# Patient Record
Sex: Male | Born: 1955
Health system: Southern US, Community
[De-identification: ages and names within clinical notes are randomized; demographics above are authoritative.]

## PROBLEM LIST (undated history)

## (undated) DIAGNOSIS — K08109 Complete loss of teeth, unspecified cause, unspecified class: Secondary | ICD-10-CM

## (undated) DIAGNOSIS — E119 Type 2 diabetes mellitus without complications: Secondary | ICD-10-CM

## (undated) DIAGNOSIS — E559 Vitamin D deficiency, unspecified: Secondary | ICD-10-CM

## (undated) DIAGNOSIS — G4730 Sleep apnea, unspecified: Secondary | ICD-10-CM

## (undated) DIAGNOSIS — Z973 Presence of spectacles and contact lenses: Secondary | ICD-10-CM

## (undated) DIAGNOSIS — R011 Cardiac murmur, unspecified: Secondary | ICD-10-CM

## (undated) DIAGNOSIS — C801 Malignant (primary) neoplasm, unspecified: Secondary | ICD-10-CM

## (undated) DIAGNOSIS — Z972 Presence of dental prosthetic device (complete) (partial): Secondary | ICD-10-CM

## (undated) DIAGNOSIS — J189 Pneumonia, unspecified organism: Secondary | ICD-10-CM

## (undated) DIAGNOSIS — E785 Hyperlipidemia, unspecified: Secondary | ICD-10-CM

## (undated) DIAGNOSIS — G4733 Obstructive sleep apnea (adult) (pediatric): Secondary | ICD-10-CM

## (undated) DIAGNOSIS — K219 Gastro-esophageal reflux disease without esophagitis: Secondary | ICD-10-CM

## (undated) DIAGNOSIS — I10 Essential (primary) hypertension: Secondary | ICD-10-CM

## (undated) DIAGNOSIS — K469 Unspecified abdominal hernia without obstruction or gangrene: Secondary | ICD-10-CM

## (undated) HISTORY — DX: Type 2 diabetes mellitus without complications: E11.9

## (undated) HISTORY — DX: Sleep apnea, unspecified: G47.30

## (undated) HISTORY — PX: METATARSAL OSTEOTOMY WITH BUNIONECTOMY: SHX5662

## (undated) HISTORY — PX: VASECTOMY: SHX75

## (undated) HISTORY — DX: Cardiac murmur, unspecified: R01.1

## (undated) HISTORY — DX: Essential (primary) hypertension: I10

## (undated) HISTORY — PX: OTHER SURGICAL HISTORY: SHX169

## (undated) HISTORY — DX: Hyperlipidemia, unspecified: E78.5

## (undated) HISTORY — PX: LASER ABLATION: SHX1947

## (undated) HISTORY — PX: MULTIPLE TOOTH EXTRACTIONS: SHX2053

## (undated) HISTORY — DX: Gastro-esophageal reflux disease without esophagitis: K21.9

## (undated) HISTORY — DX: Malignant (primary) neoplasm, unspecified: C80.1

## (undated) HISTORY — DX: Vitamin D deficiency, unspecified: E55.9

---

## 2002-03-25 ENCOUNTER — Emergency Department (HOSPITAL_COMMUNITY): Admission: EM | Admit: 2002-03-25 | Discharge: 2002-03-25 | Payer: Self-pay | Admitting: Emergency Medicine

## 2002-04-20 ENCOUNTER — Emergency Department (HOSPITAL_COMMUNITY): Admission: EM | Admit: 2002-04-20 | Discharge: 2002-04-20 | Payer: Self-pay | Admitting: Emergency Medicine

## 2006-03-26 ENCOUNTER — Observation Stay (HOSPITAL_COMMUNITY): Admission: EM | Admit: 2006-03-26 | Discharge: 2006-03-27 | Payer: Self-pay | Admitting: Emergency Medicine

## 2006-03-26 ENCOUNTER — Ambulatory Visit: Payer: Self-pay | Admitting: Cardiovascular Disease

## 2006-04-01 ENCOUNTER — Ambulatory Visit: Payer: Self-pay

## 2006-06-24 ENCOUNTER — Ambulatory Visit: Payer: Self-pay | Admitting: Cardiovascular Disease

## 2007-03-18 ENCOUNTER — Ambulatory Visit (HOSPITAL_COMMUNITY): Admission: RE | Admit: 2007-03-18 | Discharge: 2007-03-18 | Payer: Self-pay | Admitting: Internal Medicine

## 2009-04-12 ENCOUNTER — Encounter: Admission: RE | Admit: 2009-04-12 | Discharge: 2009-04-12 | Payer: Self-pay | Admitting: Internal Medicine

## 2009-05-23 ENCOUNTER — Ambulatory Visit: Payer: Self-pay | Admitting: Vascular Surgery

## 2009-08-16 ENCOUNTER — Ambulatory Visit: Payer: Self-pay | Admitting: Vascular Surgery

## 2009-09-26 ENCOUNTER — Ambulatory Visit: Payer: Self-pay | Admitting: Vascular Surgery

## 2009-09-27 ENCOUNTER — Ambulatory Visit: Payer: Self-pay | Admitting: Vascular Surgery

## 2009-10-04 ENCOUNTER — Ambulatory Visit: Payer: Self-pay | Admitting: Vascular Surgery

## 2009-10-11 ENCOUNTER — Ambulatory Visit: Payer: Self-pay | Admitting: Vascular Surgery

## 2010-06-13 ENCOUNTER — Ambulatory Visit (HOSPITAL_COMMUNITY)
Admission: RE | Admit: 2010-06-13 | Discharge: 2010-06-13 | Payer: Self-pay | Source: Home / Self Care | Admitting: Internal Medicine

## 2010-11-21 NOTE — Assessment & Plan Note (Signed)
**Note Glenn Stephens-Identified via Obfuscation** OFFICE VISIT   Glenn Stephens, Glenn Stephens  DOB:  [birth year]                                       08/16/2009  ZOXWR#:[id number]   The patient returns for further followup regarding his severe venous  insufficiency of the right leg which has been causing increasing  swelling particularly in the distal thigh and calf as well as heaviness,  aching and numbness in the right leg.  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  He has been wearing long leg elastic compression stockings (20  mm - 30 mm gradient) as well as elevating the legs as much as his work  will allow and taking ibuprofen and has had no improvement in  symptomatology.  It is affecting not only his ability to work but also  his ability to be on his feet during his free time.  He has had no DVT,  pulmonary emboli or stasis ulcers.  He does have Doppler known gross  reflux in the right great saphenous vein from the saphenofemoral  junction to the knee as well as three incompetent perforating branches  on the right side, two between the knee and ankle over the great  saphenous system and one posteriorly.  The small saphenous system is  competent, however.   PHYSICAL EXAMINATION:  On exam he continues to have bulging  varicosities, a few in the distal thigh but mostly in the calf beginning  from the knee to the ankle of the great saphenous system and wrapping  around posteriorly into the posterior calf area and down into the ankle.  He has 1+ to 2+ edema in the calf and 1+ edema in the ankle.   He certainly has severe venous insufficiency in the right leg which is  quite symptomatic and I believe the best plan would be laser ablation of  the right great saphenous vein with multiple stab phlebectomies followed  by a second course of stab phlebectomies for the posterior calf and  lateral areas.  We will not address the incompetent  perforating branches  at this time unless he should develop recurrence in the future.  We will  proceed with precertification for this nice man to perform this in the  near future.  Today's blood pressure 146/95, heart rate 77, respirations  24.  He does have 2+ to 3+ dorsalis pedis and posterior tibial pulses  bilaterally.     Quita Skye Hart [place], M.D.  Electronically Signed   JDL/MEDQ  D:  08/16/2009  T:  08/17/2009  Job:  [id number]

## 2010-11-21 NOTE — Procedures (Signed)
LOWER EXTREMITY VENOUS REFLUX EXAM   INDICATION:  Bilateral lower extremity swelling, pain and varicosities.   EXAM:  Using color-flow imaging and pulse Doppler spectral analysis,  bilateral common femoral, superficial femoral, popliteal, posterior  tibial, greater and lesser saphenous veins are evaluated.  There is no  evidence suggesting deep venous insufficiency in the right lower  extremity.  There is mild reflux in the left proximal femoral vein.   The right saphenofemoral junction is not competent with reflux of  >518milliseconds. The right great saphenous vein is not competent with  reflux of >517milliseconds with the caliber as described below.   The bilateral proximal short saphenous vein demonstrates competency.   GSV Diameter (used if found to be incompetent only)                                            Right    Left  Proximal Greater Saphenous Vein           0.95 cm  cm  Proximal-to-mid-thigh                     0.93 cm  cm  Mid thigh                                 0.73 cm  cm  Mid-distal thigh                          cm       cm  Distal thigh                              0.92 cm  cm  Knee                                      0.63 cm  cm   IMPRESSION:  1. The right great saphenous vein refluxed with >521milliseconds      identified with the caliber ranging from 0.63 cm to 0.95 cm.  2. The right great saphenous vein is not aneurysmal.  3. The right great saphenous vein not tortuous.  4. The deep venous system is competent.  5. The bilateral lesser saphenous vein is competent.  6. Incompetent perforator veins identified in the right proximal and      distal calf measuring 0.31 cm and 0.59 cm.  Left proximal calf and      distal calf both measuring 0.48 cm each, left posterior midcalf      0.68 cm, all contributing to varicosities.        ___________________________________________  Glenn Stephens, M.D.   CJ/MEDQ  D:  05/23/2009  T:   05/24/2009  Job:  [id number]

## 2010-11-21 NOTE — Assessment & Plan Note (Signed)
OFFICE VISIT   EMMANUAL, GAUTHREAUX  DOB:  [birth year]                                       09/26/2009  ZOXWR#:[id number]   Patient underwent laser ablation of the right great saphenous vein graft  with multiple stab phlebectomies (20-30) performed today under local  tumescent anesthesia.  He tolerated the procedure well.  He has painful  varicosities from reflux in the right great saphenous system.   He will return in 1 week for a follow-up venous duplex exam.     Quita Skye. Hart [place], M.D.  Electronically Signed   JDL/MEDQ  D:  09/26/2009  T:  09/27/2009  Job:  [id number]

## 2010-11-21 NOTE — Consult Note (Signed)
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  Elisabeth Most for severe venous insufficiency of the right leg.  This  patient states that he had been having increasing bulging varicosities  associated with edema in the right leg over the past 10 years which has  consistently worsened each year.  He does have heaviness, aching and  numbness in the right leg the more he is on his feet which is partially  relieved by elevation of the legs.  He does not wear elastic compression  stockings, has no history of thrombophlebitis, deep venous thrombosis,  pulmonary emboli or stasis ulcers or bleeding varicosities.  He has no  symptoms in the left leg.  He has never had a venous evaluation in the  past.  He states it is affecting his ability to work and his ability to  be on his feet in his free time.   PAST MEDICAL HISTORY:  Stable problems include:  1. Type 2 diabetes mellitus.  2. Hypertension.  3. Hyperlipidemia.  4. GERD.  5. Negative for coronary artery disease, COPD or stroke.   PAST SURGICAL HISTORY:  1. Right knee surgery.  2. Vasectomy.   FAMILY HISTORY:  Positive for DVT and cerebral hemorrhage in his mother  at a young age.  Diabetes in grandfather.  Possible coronary artery  disease in his father.   SOCIAL HISTORY:  He is married and has 3 children.  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  He quit smoking in 1989.  He does  not use alcohol.   REVIEW OF SYSTEMS:  He does have occasional weight loss, has reflux  esophagitis, dizziness, arthritis.  All other systems are negative.   ALLERGIES:  Caduet.   PHYSICAL EXAMINATION:  Blood pressure 118/70, heart rate 74,  respirations 24.  General:  Healthy-appearing middle-aged male in no  apparent distress, alert and oriented x3.  Neck:  Supple, 3+  carotid  pulses palpable.  No bruits are audible.  Neurologic:  Normal.  No  palpable adenopathy in the neck.  Chest:  Clear to auscultation.  Cardiovascular:  Regular rhythm.  No murmurs.  No skin rashes are noted.  Abdomen:  Soft, nontender with no masses.  He has 3+ femoral, popliteal,  dorsalis pedis and posterior tibial pulses bilaterally.  Both feet are  well-perfused.  There are large bulging varicosities in the right leg  over the great saphenous system both laterally and medially in the  distal thigh extending down into the medial calf and into the lateral  calf down to the ankle.  There is a big network of reticular and spider  veins around the ankle area with 1+ edema on the right.  Left leg has no  edema and has a few small varicosities but nothing of significance at  this time.   I ordered a venous duplex exam which I interpreted and visualized today.  It reveals the deep system on the right is normal.  He has gross reflux  in the right saphenofemoral junction extending to the knee to the great  saphenous vein.  There are a couple of small incompetent perforators  between  the knee and the ankle.   I feel that this patient does have symptomatic painful varicosities from  great saphenous reflux on the right.  We will treat him conservatively  with 1) long-leg elastic compression stockings, 2) elevation, 3)  ibuprofen on a regular basis.  He will return in 3 months to see if he  has had any improvement in his symptoms.  If not, I would recommend  laser ablation of his right great saphenous vein with multiple stab  phlebectomies as one procedure.   Quita Skye Hart [place], M.D.  Electronically Signed   JDL/MEDQ  D:  05/23/2009  T:  05/24/2009  Job:  [id number]   cc:   Lovenia Kim, D.O.

## 2010-11-21 NOTE — Procedures (Signed)
DUPLEX DEEP VENOUS EXAM - LOWER EXTREMITY   INDICATION:  Status post right greater saphenous vein laser ablation.   HISTORY:  Edema:  No.  Trauma/Surgery:  Right greater saphenous vein laser ablation on  09/26/2009.  Pain:  Right thigh pain for 1 week.  PE:  No.  Previous DVT:  No.  Anticoagulants:  Other:   DUPLEX EXAM:                CFV   SFV   PopV  PTV    GSV                R  L  R  L  R  L  R   L  R  L  Thrombosis    o  o  o     o     o      +  Spontaneous   +  +  +     +     +      o  Phasic        +  +  +     +     +      o  Augmentation  +  +  +     +     +      o  Compressible  +  +  +     +     +      o  Competent     o  o  o     o     o      o   Legend:  + - yes  o - no  p - partial  D - decreased   IMPRESSION:  1. No evidence of deep venous thrombosis noted in the right lower      extremity.  2. Totally occluded right greater saphenous vein noted extending from      the distal insertion site to the saphenofemoral junction.  3. Reflux noted throughout the right femoropopliteal venous system.     _____________________________  Quita Skye Hart [place], M.D.   CH/MEDQ  D:  10/05/2009  T:  10/05/2009  Job:  [id number]

## 2010-11-21 NOTE — Assessment & Plan Note (Signed)
OFFICE VISIT   AVEDIS, BEVIS  DOB:  10-27-1955                                       09/27/2009  EAVWU#:[id number]   The patient was seen today as an add on patient.  He had laser ablation  of his right great saphenous vein with between 20 and 30 stab  phlebectomies performed yesterday by me.  He was concerned about his  foot feeling cold and swelling in his ankle.  He did have his elastic  compression stocking in place.  He has been walking some and trying to  elevate the leg when not walking.  He is having no pain or numbness in  the right foot.   On examination he has a 3+ dorsalis pedis pulses and an adequately  perfused right foot with good motion and sensation.  There is some mild  edema beneath the elastic compression stocking which is intact up to the  proximal thigh.  He does not have undue tenderness over the great  saphenous vein through the dressing and everything appears appropriate.  He was reassured as was his family and will return next week for venous  duplex exam as previously scheduled.  He was encouraged to elevate the  leg as much as possible when not ambulating.     Quita Skye Hart [place], M.D.  Electronically Signed   JDL/MEDQ  D:  09/27/2009  T:  09/28/2009  Job:  [id number]

## 2010-11-21 NOTE — Assessment & Plan Note (Signed)
OFFICE VISIT   Glenn Stephens, Glenn Stephens  DOB:  [birth year]                                       10/11/2009  FUXNA#:[id number]   The patient underwent laser ablation of his right great saphenous vein  with greater than 30 stab phlebectomies on March 21.  He came the  following day because of discomfort in the leg and was felt to be  progressing adequately.  He did not take any ibuprofen following the  procedure as instructed.  He was then seen again last week when  ultrasound was performed which revealed total closure of the right great  saphenous vein with no evidence of any deep venous obstruction.  He now  returns today for further followup.  He is not having any calf  discomfort and states that the swelling in his right ankle has improved  from pre procedure.  The discomfort in the right thigh along the course  of the great saphenous vein is beginning to resolve.   On exam he has mild tenderness along the course of the great saphenous  vein with mild distal edema and no pain in the stab phlebectomy sites.  Ultrasound study was reviewed with him again revealing total closure of  the vein.  In general I think he is doing adequately and he will return  to see [place] on a p.r.n. basis.     Quita Skye Hart [place], M.D.  Electronically Signed   JDL/MEDQ  D:  10/11/2009  T:  10/12/2009  Job:  [id number]

## 2010-11-24 NOTE — H&P (Signed)
Glenn Stephens, Glenn Stephens               ACCOUNT NO.:  [id number]   MEDICAL RECORD NO.:  [id number]          PATIENT TYPE:  INP   LOCATION:  1845                         FACILITY:  [facility]   PHYSICIAN:  Merlene Laughter. Renae Gloss, M.D.DATE OF BIRTH:  08/18/1955   DATE OF ADMISSION:  03/26/2006  DATE OF DISCHARGE:                                HISTORY & PHYSICAL   PRIMARY CARE PHYSICIAN:  Lucky Cowboy, M.D.   CHIEF COMPLAINT:  Chest pain.   HISTORY OF PRESENT ILLNESS:  Glenn Stephens is a 55 year old gentleman with a  several week history of intermittent chest pain.  However, today, he states  he has mid sternal chest pain that has been persistent and has been  associated with shortness of breath and some palpitations.  He denies  nausea, vomiting, fevers, chills or diaphoresis.  He has no other acute  constitutional or systemic complaints at this time.   ALLERGIES:  Glenn Stephens had an intolerance to CADUET which consisted of GI  irritation.   PAST MEDICAL HISTORY:  1. Hypertension.  2. Hyperlipidemia.  3. History of basal cell carcinoma.   MEDICATIONS:  1. Lisinopril 20 mg p.o. daily.  2. Aciphex 20 mg p.o. daily.   FAMILY HISTORY:  Significant for melanoma and brain aneurysm in mother who  is deceased.   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  He denies tobacco or alcohol.   REVIEW OF SYSTEMS:  As per HPI, otherwise, negative.  Greater than 10  systems were reviewed.   PHYSICAL EXAMINATION:  GENERAL:  Well-developed, well-nourished male in no  acute distress.  VITAL SIGNS:  Blood pressure 129/69, temperature 98.8, respirations 18,  pulse 69.  HEENT:  No oropharyngeal lesions.  NECK:  Supple, no masses, 2+ carotids, no bruits.  LUNGS:  Clear to auscultation bilaterally.  HEART:  S1, S2, regular rate and rhythm with no murmurs, rubs or gallops.  ABDOMEN:  Soft, nontender, nondistended, positive bowel sounds.  EXTREMITIES:  No  clubbing, cyanosis or edema.  NEUROLOGIC:  Alert and oriented x4.  Cranial nerves intact.   LABORATORY DATA AND X-RAY FINDINGS:  Troponin enzymes less than 0.05, CK-MB  1.4.   Chest x-ray unremarkable.  CT angiogram unremarkable and negative for  pulmonary embolism.   ASSESSMENT/PLAN:  1. Chest pain.  Given Glenn Stephens cardiac risk factors, his chest pain may      have a cardiac etiology.  He will be admitted to telemetry.  Serial      cardiac enzymes will be obtained.  He will be placed on aspirin and a      cardiology consult will also be obtained.  2. Hypertension, currently well-controlled with lisinopril which will be      continued.  3. Hyperlipidemia.  Fasting lipid panel will be obtained.           ______________________________  Merlene Laughter. Renae Gloss, M.D.     KRS/MEDQ  D:  03/26/2006  T:  03/26/2006  Job:  [id number]

## 2010-11-24 NOTE — Consult Note (Signed)
[name], [name]               ACCOUNT NO.:  [id number]   MEDICAL RECORD NO.:  [id number]          PATIENT TYPE:  INP   LOCATION:  3715                         FACILITY:  [facility]   PHYSICIAN:  Noralyn Pick. Eden Emms, MD, FACCDATE OF BIRTH:  27-Jun-1956   DATE OF CONSULTATION:  03/27/2006  DATE OF DISCHARGE:                                   CONSULTATION   PRIMARY CARE PHYSICIAN:  Lovenia Kim, D.O.   PRIMARY CARDIOLOGIST:  New, and will see Dr. Eden Emms.   CHIEF COMPLAINT:  Chest pain.   HISTORY OF PRESENT ILLNESS:  Mr. Glenn Stephens is a 55 year old white male with no  known history of coronary artery disease.  He had onset of midepigastric  chest pain that reached a 4/10.  It started yesterday morning with no  particular exertion.  It did not radiate.  He had some shortness of breath  and a few palpitations with this, as well.  When it did not resolve, he came  to the emergency room and was admitted by his primary care  for further  evaluation.  Cardiology was asked to evaluate him.   Mr. Glenn Stephens has had occasional chest pain like this but it never lasted this  long and never got this bad.  There is no association with exertion.  It is  described as a pressure.  It was not associated with nausea, chills or  diaphoresis.  He has not tried any medication to alleviate this.  He knows  of no aggravating factors.  He is currently symptom free.   PAST MEDICAL HISTORY:  1. History of hypertension.  2. Hyperlipidemia.  3. He was told by his physician previously he has borderline diabetes, but      there is no family history of coronary artery disease and no ongoing      tobacco use.  4. He has a history of a systolic ejection murmur since he was a child.  5. He has gastroesophageal reflux disease symptoms.   ALLERGIES:  He was intolerant to Caduet with GI symptoms, and he was taken  off Lipitor for abnormal LFTs several years ago after being on it for  several years.   MEDICATIONS PRIOR  TO ADMISSION:  1. Lisinopril 20 mg a day.  2. AcipHex 20 mg a day.  3. Triglide 160 mg a day (begun 1 month ago).   SURGICAL HISTORY:  1. He has had a basal cell carcinoma removed from his left shoulder.  2. Vasectomy.   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  He has not used tobacco in 20 years and does not  abuse alcohol or drugs.  Because of his elevated cholesterol and his  borderline diabetes, he has recently been making dietary changes and  exercising.   FAMILY HISTORY:  His mother died at age 4 with a melanoma and brain  aneurysm, but no CAD.  His father is alive at age 66 with no  history of  coronary artery disease, and he has no siblings with heart disease.   REVIEW OF SYSTEMS:  Significant for chest pain which is described above.  His reflux symptoms are well-controlled on the AcipHex, and he has  occasional arthralgias.  He denies hematemesis, hemoptysis or melena.  No  recent fevers or chills.  Review of systems is otherwise negative.   PHYSICAL EXAMINATION:  VITAL SIGNS:  Temperature is 98.2, blood pressure  127/80, pulse 71, respiratory rate 20, O2 saturation 97% on room air.  GENERAL:  He is a well-developed, well-nourished white male in no acute  distress.  HEENT:  Head is normocephalic and atraumatic with pupils equal, round, and  reactive to light and accommodation.  Extraocular movements intact.  Sclerae  clear.  Nares without discharge.  NECK:  There is no lymphadenopathy, thyromegaly, bruit or JVD noted.  CV:  His heart is regular in rate and rhythm with an S1-S2, and a soft  systolic murmur is noted at left sternal border.  Distal pulses are 2+ in  all four extremities and no femoral bruits are appreciated.  LUNGS:  Clear to auscultation bilaterally.  SKIN:  No rashes or lesions are noted.  ABDOMEN:  Soft and nontender  with active bowel sounds and no  hepatosplenomegaly by palpation.  EXTREMITIES:  There is no cyanosis, clubbing or edema noted.  MUSCULOSKELETAL:  There is no joint deformity or effusion and no spine or  CVA tenderness.  NEUROLOGIC:  He is alert and oriented.  Cranial nerves II-XII grossly  intact.   CHEST X-RAY:  No acute disease.   ELECTROCARDIOGRAM:  Sinus rhythm, rate 69, with no acute ischemic changes  and no old ones available for comparison.   LABORATORY VALUES:  Total cholesterol 261, triglycerides were 484 one month  ago and now 214.  HDL 32, LDL 186.  Hemoglobin 15.3, hematocrit 45, sodium  138, potassium 3.9, chloride 108, BUN 14, creatinine 1.2, glucose 97.  D-  dimer 1.37.  CK-MB and troponin I negative x3.  CT of the chest negative for  PE dissection.   IMPRESSION:  1. Chest pain.  He has no rub, but a sedimentation rate will be checked to      make sure that there is no beginning pericarditis.  As his enzymes are      negative and his chest pain has resolved, he will be scheduled for an      outpatient Myoview to evaluate for ischemia.  He is to continue taking      a baby aspirin daily.  He will discuss the results with Dr. Eden Emms in      the office.  2. Hyperlipidemia.  He is to continue the fenofibrate but will need other      medical therapy to decrease the LDL.  We will obtain records from his      family physician on his LFTs which were reportedly elevated when he was      on Lipitor.  Dr. Eden Emms is to evaluate these and decide on appropriate      therapy.  3. Mr. Glenn Stephens is otherwise stable from a cardiac standpoint.  He has an      outpatient stress test arranged for Friday at 11:45 and is to see Dr.      Eden Emms the following Monday at noon.  From a cardiac standpoint, no      further inpatient workup is required.     ______________________________  Theodore Demark, PA-C  ______________________________ Noralyn Pick Eden Emms, MD, [facility]    RB/MEDQ  D:   03/27/2006  T:  03/28/2006  Job:  [id number]   cc:   Lovenia Kim, D.O.

## 2010-11-24 NOTE — Discharge Summary (Signed)
Glenn Stephens, Glenn Stephens NO.:  [id number]   MEDICAL RECORD NO.:  [id number]          PATIENT TYPE:  INP   LOCATION:  3715                         FACILITY:  [facility]   PHYSICIAN:  Marcellus Scott, MD     DATE OF BIRTH:  Jun 11, 1956   DATE OF ADMISSION:  03/26/2006  DATE OF DISCHARGE:  03/27/2006                                 DISCHARGE SUMMARY   PRIMARY CARE PHYSICIAN:  Lucky Cowboy, M.D.   DISCHARGE DIAGNOSES:  1. Recurrent atypical chest pain.  2. Hypertension.  3. Hyperlipidemia.   MEDICATIONS ON DISCHARGE:  1. Aspirin 81 mg by mouth daily.  2. Patient to continue his home medications.  3. Lisinopril 20 mg by mouth daily.  4. AcipHex 20 mg by mouth daily.  5. Triglide 160 mg by mouth daily.   INVESTIGATIONS/PROCEDURES DONE DURING THIS ADMISSION:  1. CT angiogram of the chest on March 26, 2006, impression was no      evidence of acute abnormality of the chest or upper abdomen.  2. Chest x-ray done on March 26, 2006.  There was no active disease.  3. D-dimer was 1.37.   CONSULTATIONS OBTAINED DURING THIS ADMISSION:  Cardiology consult which was  obtained from [facility].   HOSPITAL COURSE:  1. [name] is a 55 year old male patient with a history of      hypertension, hyperlipidemia, and basal cell carcinoma who, since 3-4      weeks ago, has been complaining of intermittent epigastric, lower, and      mid sternal chest pain which has been sometimes associated with      shortness of breath and palpitations.  He denies any nausea, vomiting,      fever, chills, or diaphoresis.  Yesterday, the pain got severe enough      for him to seek attention at the emergency room.  On further      interrogation, he says the pain is epigastric or lower retrosternal      intermittent nonradiating usually when he is hungry, relieved      temporarily with Maalox or Tums.  He denies any water brash.  He does      have a history of esophageal ulcers years  ago which was confirmed on a      barium meal.  He also says that he has irregular food habits but denies      any history of alcohol consumption or smoking.  The patient was noted      to have stable vital signs and physical exam.  His point of care      cardiac markers were negative, however, his D-dimer was mildly positive      for which he had a CT angiogram which was negative for a pulmonary      embolism.  He was admitted to rule out an acute coronary syndrome on      telemetry, but serial cardiac enzymes were negative.  He was placed on      aspirin and a cardiology consult from [facility] was obtained.      They have  seen the patient and decided that the patient is stable      enough to be discharged to have a stress test on this Friday as an      outpatient, and he is to continue aspirin daily.  The atypical chest      pain or epigastric pain seems gastric in origin for which the patient      is to continue his proton pump inhibitor, that is AcipHex, and to      consider further GI evaluation as outpatient for a possible upper      gastrointestinal tract etiology like PUD, gastritis, or GERD as a cause      of his atypical chest pain.  2. His hypertension has been well-controlled on lisinopril which he is to      continue.  3. Hyperlipidemia.  His most recent lab studies in hospital, cholesterol      of 261, triglycerides of 414, HDL of 32, and LDL of 186.  The patient      is to continue his Triglide.  He will have a Statin added to his      regimen as an outpatient, but the patient is intolerant of Lipitor, so      this has to be followed up further with his primary care physician.      Marcellus Scott, MD  Electronically Signed     AH/MEDQ  D:  03/27/2006  T:  03/28/2006  Job:  [id number]   cc:   Lucky Cowboy, M.D.

## 2010-11-24 NOTE — Assessment & Plan Note (Signed)
[facility]                            CARDIOLOGY OFFICE NOTE   Glenn Stephens, Glenn Stephens                      MRN:          [id number]  DATE:06/24/2006                            DOB:          1955-08-26    HISTORY OF PRESENT ILLNESS:  Glenn Stephens returns today for follow up.  He has  previous epigastric and chest pain with normal Myoview in September.  He  has been doing fairly well.  He has significant hyperlipidemia with an  LDL of about 185.  Apparently, when he saw Dr. Manus Gunning last year, he had  an elevation in his LFT's on Caduet.  He also has a question of some  weakness and knee pain.  He is starting to have a little bit of the knee  pain, and he has not had his LFT's checked.  I told him we would check a  fasting lipid profile and his LFT's today.  We may need to try him off  his Statin again, depending on how his clinical symptoms are.  He has  not had any recurrent chest pain.   REVIEW OF SYSTEMS:  Otherwise, unremarkable.   MEDICATIONS:  1. Lisinopril 20 mg daily.  2. Triglide 160 daily.  3. Simvastatin 40 daily.  4. Omeprazole 20 daily.  5. Aspirin daily.   PHYSICAL EXAMINATION:  VITAL SIGNS:  Blood pressure 128/70.  HEENT:  Normal.  No thyromegaly.  No lymphadenopathy.  NO carotid  bruits.  LUNGS:  Clear.  HEART:  S1, S2 with normal heart sounds.  ABDOMEN:  Benign.  No AAA.  No hepatosplenomegaly or tenderness.  Distal  pulses are intact.  No edema.  NEUROLOGICAL:  Nonfocal.  SKIN:  Warm and dry.   IMPRESSION:  Stable, previous chest pain, nonischemic Myoview.  Continue  to observe.  Take an aspirin a day.  The patient had significant  hyperlipidemia and hypertriglyceridemia.  He will have his LFT's checked  today.  Will have to watch him clinically since he has had elevated  LFT's and myalgias on Statin before.  At some point in time, he may need  to be switched to Niaspan or Zetia.  However, his LDL was significantly  elevated, and  Statin would be his best choice.  We will check his LFT's  and follow him clinically in three months.     Noralyn Pick. Eden Emms, MD, [facility]  Electronically Signed    PCN/MedQ  DD: 06/24/2006  DT: 06/24/2006  Job #: [id number]

## 2011-05-29 LAB — FECAL OCCULT BLOOD, GUAIAC: Fecal Occult Blood: NEGATIVE

## 2011-08-21 ENCOUNTER — Ambulatory Visit (HOSPITAL_COMMUNITY)
Admission: RE | Admit: 2011-08-21 | Discharge: 2011-08-21 | Disposition: A | Discharge status: Home / Self Care | Payer: BC Managed Care – PPO | Source: Ambulatory Visit | Attending: Internal Medicine | Admitting: Internal Medicine

## 2011-08-21 ENCOUNTER — Other Ambulatory Visit (HOSPITAL_COMMUNITY): Payer: Self-pay | Admitting: Internal Medicine

## 2011-08-21 DIAGNOSIS — M545 Low back pain, unspecified: Secondary | ICD-10-CM

## 2011-08-21 DIAGNOSIS — M51379 Other intervertebral disc degeneration, lumbosacral region without mention of lumbar back pain or lower extremity pain: Secondary | ICD-10-CM | POA: Insufficient documentation

## 2011-08-21 DIAGNOSIS — M5137 Other intervertebral disc degeneration, lumbosacral region: Secondary | ICD-10-CM | POA: Insufficient documentation

## 2012-05-28 LAB — HM DIABETES EYE EXAM

## 2012-09-02 ENCOUNTER — Other Ambulatory Visit (HOSPITAL_COMMUNITY): Payer: Self-pay | Admitting: Internal Medicine

## 2012-09-02 ENCOUNTER — Ambulatory Visit (HOSPITAL_COMMUNITY)
Admission: RE | Admit: 2012-09-02 | Discharge: 2012-09-02 | Disposition: A | Discharge status: Home / Self Care | Payer: BC Managed Care – PPO | Source: Ambulatory Visit | Attending: Internal Medicine | Admitting: Internal Medicine

## 2012-09-02 DIAGNOSIS — M545 Low back pain, unspecified: Secondary | ICD-10-CM

## 2012-09-02 DIAGNOSIS — M539 Dorsopathy, unspecified: Secondary | ICD-10-CM | POA: Insufficient documentation

## 2012-09-02 DIAGNOSIS — M5137 Other intervertebral disc degeneration, lumbosacral region: Secondary | ICD-10-CM | POA: Insufficient documentation

## 2012-09-02 DIAGNOSIS — M51379 Other intervertebral disc degeneration, lumbosacral region without mention of lumbar back pain or lower extremity pain: Secondary | ICD-10-CM | POA: Insufficient documentation

## 2013-02-16 ENCOUNTER — Encounter: Payer: Self-pay | Admitting: Vascular Surgery

## 2013-02-17 ENCOUNTER — Encounter: Payer: Self-pay | Admitting: Vascular Surgery

## 2013-02-17 ENCOUNTER — Encounter (INDEPENDENT_AMBULATORY_CARE_PROVIDER_SITE_OTHER): Payer: BC Managed Care – PPO | Admitting: *Deleted

## 2013-02-17 ENCOUNTER — Ambulatory Visit (INDEPENDENT_AMBULATORY_CARE_PROVIDER_SITE_OTHER): Payer: BC Managed Care – PPO | Admitting: Vascular Surgery

## 2013-02-17 ENCOUNTER — Other Ambulatory Visit: Payer: Self-pay | Admitting: *Deleted

## 2013-02-17 VITALS — BP 122/81 | HR 94 | Resp 16 | Ht 72.0 in | Wt 240.0 lb

## 2013-02-17 DIAGNOSIS — I83893 Varicose veins of bilateral lower extremities with other complications: Secondary | ICD-10-CM

## 2013-02-17 DIAGNOSIS — I8393 Asymptomatic varicose veins of bilateral lower extremities: Secondary | ICD-10-CM | POA: Insufficient documentation

## 2013-02-17 NOTE — Progress Notes (Signed)
Subjective:     Patient ID: Glenn Stephens, male   DOB: August 02, 1955, 57 y.o.   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 He previously had laser ablation stab phlebectomy of the right great saphenous vein performed in 2010. Initially a good result. Over the past few years he has developed recurrent bulging varicosities in the right medial calf of the great saphenous vein which had become quite symptomatic. He complains of aching throbbing and burning discomfort. He has no history of DVT. He was prescribed long-leg elastic compression stockings by Dr. Oneta Rack June 4 of this year and he has been wearing his on a daily basis. He is also trying elevation and ibuprofen.  Past Medical History  Diagnosis Date  . Diabetes mellitus without complication   . Cancer     History  Substance Use Topics  . Smoking status: Former Games developer  . Smokeless tobacco: Not on file  . Alcohol Use: No    Family History  Problem Relation Age of Onset  . Cancer Mother     Allergies  Allergen Reactions  . Caduet (Amlodipine-Atorvastatin)     Current outpatient prescriptions:aspirin 81 MG tablet, Take 81 mg by mouth daily., Disp: , Rfl: ;  citalopram (CELEXA) 20 MG tablet, Take 20 mg by mouth daily., Disp: , Rfl: ;  lisinopril (PRINIVIL,ZESTRIL) 40 MG tablet, Take 40 mg by mouth daily., Disp: , Rfl: ;  metFORMIN (GLUCOPHAGE) 500 MG tablet, Take 500 mg by mouth 2 (two) times daily with a meal., Disp: , Rfl: ;  omeprazole (PRILOSEC) 20 MG capsule, Take 20 mg by mouth daily., Disp: , Rfl:   BP 122/81  Pulse 94  Resp 16  Ht 6' (1.829 m)  Wt 240 lb (108.863 kg)  BMI 32.54 kg/m2  Body mass index is 32.54 kg/(m^2).          Review of Systems denies chest pain, dyspnea on exertion, PND, orthopnea, hemoptysis, claudication. Other systems negative and a complete review of systems    Objective:   Physical Exam BP 122/81  Pulse 94  Resp 16  Ht 6' (1.829  m)  Wt 240 lb (108.863 kg)  BMI 32.54 kg/m2  Gen.-alert and oriented x3 in no apparent distress HEENT normal for age Lungs no rhonchi or wheezing Cardiovascular regular rhythm no murmurs carotid pulses 3+ palpable no bruits audible Abdomen soft nontender no palpable masses Musculoskeletal free of  major deformities Skin clear -no rashes Neurologic normal Lower extremities 3+ femoral and dorsalis pedis pulses palpable bilaterally with no edema Right leg with 1+ edema distally and bulging varicosities in the medial calf. A few spider veins noted on the medial thigh and medial calf area as well. No active ulceration noted. Left leg is free of varicosities with mild edema distally.  Today I ordered a venous duplex exam of the right leg which are reviewed and interpreted. Also performed an independent SonoSite bedside ultrasound exam. The exam in the lab revealed the anterior accessory branch have reflux in the right side. The right small saphenous vein is small with a small area of reflux in the midportion. The right great saphenous vein was nonvisualized.  In my independent exam there is definitely a patent right great saphenous vein which appears to have reflux from the saphenofemoral junction to the knee supplying these varicosities as well as the anterior accessory branch has reflux.       Assessment:     Recurrent  venous insufficiency right leg with recurrent varicosities which are symptomatic and resistant to conservative measures thus far    Plan:     We'll continue a three-month trial of long-leg elastic compression stockings and elevation and ibuprofen and have patient return in 6-8 weeks after the three-month period is over. At that point we'll repeat a formal venous duplex exam to look at right great saphenous vein and anterior accessory branch to further clarify the situation If the great saphenous vein on the right is patent with reflux and the patient should have laser  ablation right great saphenous vein with multiple stab phlebectomy 10-20 to relieve his symptoms

## 2013-02-18 NOTE — Addendum Note (Signed)
Addended by: Sharee Pimple on: 02/18/2013 08:09 AM   Modules accepted: Orders

## 2013-02-25 ENCOUNTER — Other Ambulatory Visit: Payer: Self-pay | Admitting: *Deleted

## 2013-02-25 DIAGNOSIS — I83893 Varicose veins of bilateral lower extremities with other complications: Secondary | ICD-10-CM

## 2013-03-16 ENCOUNTER — Encounter: Payer: Self-pay | Admitting: Vascular Surgery

## 2013-03-17 ENCOUNTER — Encounter: Payer: Self-pay | Admitting: Vascular Surgery

## 2013-03-17 ENCOUNTER — Ambulatory Visit (INDEPENDENT_AMBULATORY_CARE_PROVIDER_SITE_OTHER): Payer: BC Managed Care – PPO | Admitting: Vascular Surgery

## 2013-03-17 ENCOUNTER — Encounter (INDEPENDENT_AMBULATORY_CARE_PROVIDER_SITE_OTHER): Payer: BC Managed Care – PPO | Admitting: *Deleted

## 2013-03-17 VITALS — BP 125/79 | HR 66 | Resp 16 | Ht 72.0 in | Wt 248.0 lb

## 2013-03-17 DIAGNOSIS — I83893 Varicose veins of bilateral lower extremities with other complications: Secondary | ICD-10-CM

## 2013-03-17 NOTE — Progress Notes (Signed)
Subjective:     Patient ID: Glenn Stephens, male   DOB: Sep 22, 1955, 57 y.o.   MRN: [id number]  HPI this 57 year old male returns for continued followup regarding his recurrent varicosities in the right leg. These are causing aching throbbing and burning discomfort and he has been wearing long leg elastic compression stockings 20-30 mm gradient since June 4 as well as trying elevation and ibuprofen without success. He had previous laser ablation in 2010.  Past Medical History  Diagnosis Date  . Diabetes mellitus without complication   . Cancer     History  Substance Use Topics  . Smoking status: Former Games developer  . Smokeless tobacco: Not on file  . Alcohol Use: No    Family History  Problem Relation Age of Onset  . Cancer Mother     Allergies  Allergen Reactions  . Caduet [Amlodipine-Atorvastatin]     Current outpatient prescriptions:aspirin 81 MG tablet, Take 81 mg by mouth daily., Disp: , Rfl: ;  citalopram (CELEXA) 20 MG tablet, Take 20 mg by mouth daily., Disp: , Rfl: ;  lisinopril (PRINIVIL,ZESTRIL) 40 MG tablet, Take 40 mg by mouth daily., Disp: , Rfl: ;  metFORMIN (GLUCOPHAGE) 500 MG tablet, Take 500 mg by mouth 2 (two) times daily with a meal., Disp: , Rfl: ;  omeprazole (PRILOSEC) 20 MG capsule, Take 20 mg by mouth daily., Disp: , Rfl:   BP 125/79  Pulse 66  Resp 16  Ht 6' (1.829 m)  Wt 248 lb (112.492 kg)  BMI 33.63 kg/m2  Body mass index is 33.63 kg/(m^2).           Review of Systems denies chest pain, dyspnea on exertion, PND, orthopnea, hemoptysis.    Objective:   Physical Exam BP 125/79  Pulse 66  Resp 16  Ht 6' (1.829 m)  Wt 248 lb (112.492 kg)  BMI 33.63 kg/m2  General well-developed well-nourished male no apparent stress alert and oriented x3 Lungs no rhonchi or wheezing Right lower extremity with bulging varicosities in the medial calf from the knee to the medial malleolus. 1+ edema. 3 posterior cells pedis pulse palpable.  Today I ordered a  venous duplex exam of the right leg which are reviewed and interpreted. There is gross reflux in the right great saphenous vein which is supplying these bulging varicosities. This is the anterior accessory branch which runs parallel to the great saphenous vein which was closed and 2010 which remains close. There is no DVT.     Assessment:     Venous insufficiency right leg with recurrent varicosities which are painful and resistant to conservative measures do to reflux right great saphenous system    Plan:     Patient needs a laser ablation right great saphenous system-anterior accessory branch with 10-20 stab phlebectomy of secondary painful varicosities to relieve his symptoms We'll proceed with precertification perform this in the near future

## 2013-03-31 ENCOUNTER — Other Ambulatory Visit: Payer: Self-pay | Admitting: *Deleted

## 2013-03-31 DIAGNOSIS — I83893 Varicose veins of bilateral lower extremities with other complications: Secondary | ICD-10-CM

## 2013-04-07 ENCOUNTER — Telehealth: Payer: Self-pay | Admitting: *Deleted

## 2013-04-07 NOTE — Telephone Encounter (Signed)
Pt called asking for an explanation as to what the procedure scheduled for next week will cost. (I had included his costs in the pre-op letter.)  I went over his benefits. He was upset that it is going to cost so much so wishes to cancel.

## 2013-04-21 ENCOUNTER — Encounter (HOSPITAL_COMMUNITY): Payer: BC Managed Care – PPO

## 2013-04-21 ENCOUNTER — Ambulatory Visit: Payer: BC Managed Care – PPO | Admitting: Vascular Surgery

## 2013-04-27 ENCOUNTER — Other Ambulatory Visit: Payer: BC Managed Care – PPO | Admitting: Vascular Surgery

## 2013-05-05 ENCOUNTER — Ambulatory Visit: Payer: BC Managed Care – PPO | Admitting: Vascular Surgery

## 2013-05-14 ENCOUNTER — Other Ambulatory Visit: Payer: Self-pay

## 2013-05-29 ENCOUNTER — Other Ambulatory Visit: Payer: Self-pay | Admitting: Emergency Medicine

## 2013-05-29 DIAGNOSIS — Z0000 Encounter for general adult medical examination without abnormal findings: Secondary | ICD-10-CM

## 2013-06-01 ENCOUNTER — Other Ambulatory Visit: Payer: BC Managed Care – PPO

## 2013-06-01 DIAGNOSIS — Z0000 Encounter for general adult medical examination without abnormal findings: Secondary | ICD-10-CM

## 2013-06-01 LAB — CBC WITH DIFFERENTIAL/PLATELET
Basophils Relative: 0 % (ref 0–1)
Eosinophils Absolute: 0.1 10*3/uL (ref 0.0–0.7)
Eosinophils Relative: 2 % (ref 0–5)
Lymphocytes Relative: 40 % (ref 12–46)
Lymphs Abs: 2.4 10*3/uL (ref 0.7–4.0)
MCH: 29.3 pg (ref 26.0–34.0)
Monocytes Absolute: 0.4 10*3/uL (ref 0.1–1.0)
Monocytes Relative: 7 % (ref 3–12)
Neutro Abs: 2.9 10*3/uL (ref 1.7–7.7)
Neutrophils Relative %: 51 % (ref 43–77)
Platelets: 234 10*3/uL (ref 150–400)
RBC: 4.75 MIL/uL (ref 4.22–5.81)

## 2013-06-01 LAB — LIPID PANEL
Cholesterol: 220 mg/dL — ABNORMAL HIGH (ref 0–200)
HDL: 38 mg/dL — ABNORMAL LOW (ref >39)
Total CHOL/HDL Ratio: 5.8 Ratio
Triglycerides: 454 mg/dL — ABNORMAL HIGH (ref <150)

## 2013-06-01 LAB — BASIC METABOLIC PANEL WITH GFR
BUN: 13 mg/dL (ref 6–23)
CO2: 29 mEq/L (ref 19–32)
Chloride: 97 mEq/L (ref 96–112)
Glucose, Bld: 160 mg/dL — ABNORMAL HIGH (ref 70–99)
Potassium: 4.6 mEq/L (ref 3.5–5.3)

## 2013-06-01 LAB — MAGNESIUM: Magnesium: 1.7 mg/dL (ref 1.5–2.5)

## 2013-06-01 LAB — HEPATIC FUNCTION PANEL
AST: 21 U/L (ref 0–37)
Total Bilirubin: 0.8 mg/dL (ref 0.3–1.2)

## 2013-06-01 LAB — HEMOGLOBIN A1C
Hgb A1c MFr Bld: 6.8 % — ABNORMAL HIGH (ref <5.7)
Mean Plasma Glucose: 148 mg/dL — ABNORMAL HIGH (ref <117)

## 2013-06-02 ENCOUNTER — Encounter: Payer: Self-pay | Admitting: Internal Medicine

## 2013-06-02 DIAGNOSIS — I10 Essential (primary) hypertension: Secondary | ICD-10-CM

## 2013-06-02 DIAGNOSIS — K219 Gastro-esophageal reflux disease without esophagitis: Secondary | ICD-10-CM

## 2013-06-02 DIAGNOSIS — E559 Vitamin D deficiency, unspecified: Secondary | ICD-10-CM | POA: Insufficient documentation

## 2013-06-02 DIAGNOSIS — E119 Type 2 diabetes mellitus without complications: Secondary | ICD-10-CM

## 2013-06-02 DIAGNOSIS — E785 Hyperlipidemia, unspecified: Secondary | ICD-10-CM

## 2013-06-02 LAB — URINALYSIS, ROUTINE W REFLEX MICROSCOPIC
Bilirubin Urine: NEGATIVE
Nitrite: NEGATIVE
Protein, ur: NEGATIVE mg/dL

## 2013-06-02 LAB — INSULIN, FASTING: Insulin fasting, serum: 30 u[IU]/mL — ABNORMAL HIGH (ref 3–28)

## 2013-06-02 LAB — VITAMIN D 25 HYDROXY (VIT D DEFICIENCY, FRACTURES): Vit D, 25-Hydroxy: 38 ng/mL (ref 30–89)

## 2013-06-02 LAB — MICROALBUMIN / CREATININE URINE RATIO: Microalb, Ur: 1.8 mg/dL (ref 0.00–1.89)

## 2013-06-02 LAB — PSA: PSA: 0.18 ng/mL (ref <=4.00)

## 2013-06-02 LAB — TESTOSTERONE: Testosterone: 223 ng/dL — ABNORMAL LOW (ref 300–890)

## 2013-06-03 ENCOUNTER — Encounter: Payer: Self-pay | Admitting: Emergency Medicine

## 2013-06-24 ENCOUNTER — Encounter: Payer: Self-pay | Admitting: Emergency Medicine

## 2013-06-24 ENCOUNTER — Ambulatory Visit (INDEPENDENT_AMBULATORY_CARE_PROVIDER_SITE_OTHER): Payer: BC Managed Care – PPO | Admitting: Emergency Medicine

## 2013-06-24 VITALS — BP 128/80 | HR 84 | Temp 98.2°F | Resp 18 | Ht 72.0 in | Wt 248.0 lb

## 2013-06-24 DIAGNOSIS — Z0000 Encounter for general adult medical examination without abnormal findings: Secondary | ICD-10-CM

## 2013-06-24 DIAGNOSIS — Z125 Encounter for screening for malignant neoplasm of prostate: Secondary | ICD-10-CM

## 2013-06-24 DIAGNOSIS — R5383 Other fatigue: Secondary | ICD-10-CM

## 2013-06-24 DIAGNOSIS — E559 Vitamin D deficiency, unspecified: Secondary | ICD-10-CM

## 2013-06-24 DIAGNOSIS — I10 Essential (primary) hypertension: Secondary | ICD-10-CM

## 2013-06-24 DIAGNOSIS — R7309 Other abnormal glucose: Secondary | ICD-10-CM

## 2013-06-24 DIAGNOSIS — E782 Mixed hyperlipidemia: Secondary | ICD-10-CM

## 2013-06-24 DIAGNOSIS — E785 Hyperlipidemia, unspecified: Secondary | ICD-10-CM

## 2013-06-24 DIAGNOSIS — Z79899 Other long term (current) drug therapy: Secondary | ICD-10-CM

## 2013-06-24 DIAGNOSIS — Z23 Encounter for immunization: Secondary | ICD-10-CM

## 2013-06-24 DIAGNOSIS — R5381 Other malaise: Secondary | ICD-10-CM

## 2013-06-24 DIAGNOSIS — E119 Type 2 diabetes mellitus without complications: Secondary | ICD-10-CM

## 2013-06-24 DIAGNOSIS — Z1212 Encounter for screening for malignant neoplasm of rectum: Secondary | ICD-10-CM

## 2013-06-24 DIAGNOSIS — E291 Testicular hypofunction: Secondary | ICD-10-CM

## 2013-06-24 LAB — CBC WITH DIFFERENTIAL/PLATELET
Basophils Absolute: 0 10*3/uL (ref 0.0–0.1)
Basophils Relative: 0 % (ref 0–1)
Eosinophils Absolute: 0.3 10*3/uL (ref 0.0–0.7)
Eosinophils Relative: 4 % (ref 0–5)
Hemoglobin: 13.6 g/dL (ref 13.0–17.0)
MCH: 29.2 pg (ref 26.0–34.0)
MCHC: 36.1 g/dL — ABNORMAL HIGH (ref 30.0–36.0)
MCV: 80.9 fL (ref 78.0–100.0)
Neutro Abs: 3.4 10*3/uL (ref 1.7–7.7)
Platelets: 223 10*3/uL (ref 150–400)
RDW: 13.7 % (ref 11.5–15.5)

## 2013-06-24 LAB — HEPATIC FUNCTION PANEL
ALT: 30 U/L (ref 0–53)
Alkaline Phosphatase: 46 U/L (ref 39–117)
Bilirubin, Direct: 0.1 mg/dL (ref 0.0–0.3)
Indirect Bilirubin: 0.5 mg/dL (ref 0.0–0.9)
Total Protein: 7.1 g/dL (ref 6.0–8.3)

## 2013-06-24 LAB — LIPID PANEL
Cholesterol: 195 mg/dL (ref 0–200)
HDL: 29 mg/dL — ABNORMAL LOW (ref >39)
Triglycerides: 433 mg/dL — ABNORMAL HIGH (ref <150)

## 2013-06-24 LAB — BASIC METABOLIC PANEL WITH GFR
BUN: 17 mg/dL (ref 6–23)
CO2: 26 mEq/L (ref 19–32)
Calcium: 9.4 mg/dL (ref 8.4–10.5)
Chloride: 100 mEq/L (ref 96–112)
Creat: 1 mg/dL (ref 0.50–1.35)
Potassium: 4.5 mEq/L (ref 3.5–5.3)

## 2013-06-24 LAB — TSH: TSH: 2.593 u[IU]/mL (ref 0.350–4.500)

## 2013-06-24 LAB — PSA: PSA: 0.15 ng/mL (ref <=4.00)

## 2013-06-24 NOTE — Patient Instructions (Addendum)
Diabetes Meal Planning Guide The diabetes meal planning guide is a tool to help you plan your meals and snacks. It is important for people with diabetes to manage their blood glucose (sugar) levels. Choosing the right foods and the right amounts throughout your day will help control your blood glucose. Eating right can even help you improve your blood pressure and reach or maintain a healthy weight. CARBOHYDRATE COUNTING MADE EASY When you eat carbohydrates, they turn to sugar. This raises your blood glucose level. Counting carbohydrates can help you control this level so you feel better. When you plan your meals by counting carbohydrates, you can have more flexibility in what you eat and balance your medicine with your food intake. Carbohydrate counting simply means adding up the total amount of carbohydrate grams in your meals and snacks. Try to eat about the same amount at each meal. Foods with carbohydrates are listed below. Each portion below is 1 carbohydrate serving or 15 grams of carbohydrates. Ask your dietician how many grams of carbohydrates you should eat at each meal or snack. Grains and Starches  1 slice bread.   English muffin or hotdog/hamburger bun.   cup cold cereal (unsweetened).   cup cooked pasta or rice.   cup starchy vegetables (corn, potatoes, peas, beans, winter squash).  1 tortilla (6 inches).   bagel.  1 waffle or pancake (size of a CD).   cup cooked cereal.  4 to 6 small crackers. *Whole grain is recommended. Fruit  1 cup fresh unsweetened berries, melon, papaya, pineapple.  1 small fresh fruit.   banana or mango.   cup fruit juice (4 oz unsweetened).   cup canned fruit in natural juice or water.  2 tbs dried fruit.  12 to 15 grapes or cherries. Milk and Yogurt  1 cup fat-free or 1% milk.  1 cup soy milk.  6 oz light yogurt with sugar-free sweetener.  6 oz low-fat soy yogurt.  6 oz plain yogurt. Vegetables  1 cup raw or  cup  cooked is counted as 0 carbohydrates or a "free" food.  If you eat 3 or more servings at 1 meal, count them as 1 carbohydrate serving. Other Carbohydrates   oz chips or pretzels.   cup ice cream or frozen yogurt.   cup sherbet or sorbet.  2 inch square cake, no frosting.  1 tbs honey, sugar, jam, jelly, or syrup.  2 small cookies.  3 squares of graham crackers.  3 cups popcorn.  6 crackers.  1 cup broth-based soup.  Count 1 cup casserole or other mixed foods as 2 carbohydrate servings.  Foods with less than 20 calories in a serving may be counted as 0 carbohydrates or a "free" food. You may want to purchase a book or computer software that lists the carbohydrate gram counts of different foods. In addition, the nutrition facts panel on the labels of the foods you eat are a good source of this information. The label will tell you how big the serving size is and the total number of carbohydrate grams you will be eating per serving. Divide this number by 15 to obtain the number of carbohydrate servings in a portion. Remember, 1 carbohydrate serving equals 15 grams of carbohydrate. SERVING SIZES Measuring foods and serving sizes helps you make sure you are getting the right amount of food. The list below tells how big or small some common serving sizes are.  1 oz.........4 stacked dice.  3 oz.........Deck of cards.  1 tsp........Tip   of little finger.  1 tbs......Marland KitchenMarland KitchenThumb.  2 tbs.......Marland KitchenGolf ball.   cup......Marland KitchenHalf of a fist.  1 cup.......Marland KitchenA fist. SAMPLE DIABETES MEAL PLAN Below is a sample meal plan that includes foods from the grain and starches, dairy, vegetable, fruit, and meat groups. A dietician can individualize a meal plan to fit your calorie needs and tell you the number of servings needed from each food group. However, controlling the total amount of carbohydrates in your meal or snack is more important than making sure you include all of the food groups at every  meal. You may interchange carbohydrate containing foods (dairy, starches, and fruits). The meal plan below is an example of a 2000 calorie diet using carbohydrate counting. This meal plan has 17 carbohydrate servings. Breakfast  1 cup oatmeal (2 carb servings).   cup light yogurt (1 carb serving).  1 cup blueberries (1 carb serving).   cup almonds. Snack  1 large apple (2 carb servings).  1 low-fat string cheese stick. Lunch  Chicken breast salad.  1 cup spinach.   cup chopped tomatoes.  2 oz chicken breast, sliced.  2 tbs low-fat [place] dressing.  12 whole-wheat crackers (2 carb servings).  12 to 15 grapes (1 carb serving).  1 cup low-fat milk (1 carb serving). Snack  1 cup carrots.   cup hummus (1 carb serving). Dinner  3 oz broiled salmon.  1 cup brown rice (3 carb servings). Snack  1  cups steamed broccoli (1 carb serving) drizzled with 1 tsp olive oil and lemon juice.  1 cup light pudding (2 carb servings). DIABETES MEAL PLANNING WORKSHEET Your dietician can use this worksheet to help you decide how many servings of foods and what types of foods are right for you.  BREAKFAST Food Group and Servings / Carb Servings Grain/Starches __________________________________ Dairy __________________________________________ Vegetable ______________________________________ Fruit ___________________________________________ Meat __________________________________________ Fat ____________________________________________ LUNCH Food Group and Servings / Carb Servings Grain/Starches ___________________________________ Dairy ___________________________________________ Fruit ____________________________________________ Meat ___________________________________________ Fat _____________________________________________ Laural Golden Food Group and Servings / Carb Servings Grain/Starches ___________________________________ Dairy  ___________________________________________ Fruit ____________________________________________ Meat ___________________________________________ Fat _____________________________________________ SNACKS Food Group and Servings / Carb Servings Grain/Starches ___________________________________ Dairy ___________________________________________ Vegetable _______________________________________ Fruit ____________________________________________ Meat ___________________________________________ Fat _____________________________________________ DAILY TOTALS Starches _________________________ Vegetable ________________________ Fruit ____________________________ Dairy ____________________________ Meat ____________________________ Fat ______________________________ Document Released: 03/22/2005 Document Revised: 09/17/2011 Document Reviewed: 01/31/2009 Exit[employer] Patient Information 2014 [employer] Insomnia Insomnia is frequent trouble falling and/or staying asleep. Insomnia can be a long term problem or a short term problem. Both are common. Insomnia can be a short term problem when the wakefulness is related to a certain stress or worry. Long term insomnia is often related to ongoing stress during waking hours and/or poor sleeping habits. Overtime, sleep deprivation itself can make the problem worse. Every little thing feels more severe because you are overtired and your ability to cope is decreased. CAUSES   Stress, anxiety, and depression.  Poor sleeping habits.  Distractions such as TV in the bedroom.  Naps close to bedtime.  Engaging in emotionally charged conversations before bed.  Technical reading before sleep.  Alcohol and other sedatives. They may make the problem worse. They can hurt normal sleep patterns and normal dream activity.  Stimulants such as caffeine for several hours prior to bedtime.  Pain syndromes and shortness of breath can cause insomnia.  Exercise late  at night.  Changing time zones may cause sleeping problems (jet lag). It is sometimes helpful to have someone observe your sleeping patterns. They should look  for periods of not breathing during the night (sleep apnea). They should also look to see how long those periods last. If you live alone or observers are uncertain, you can also be observed at a sleep clinic where your sleep patterns will be professionally monitored. Sleep apnea requires a checkup and treatment. Give your caregivers your medical history. Give your caregivers observations your family has made about your sleep.  SYMPTOMS   Not feeling rested in the morning.  Anxiety and restlessness at bedtime.  Difficulty falling and staying asleep. TREATMENT   Your caregiver may prescribe treatment for an underlying medical disorders. Your caregiver can give advice or help if you are using alcohol or other drugs for self-medication. Treatment of underlying problems will usually eliminate insomnia problems.  Medications can be prescribed for short time use. They are generally not recommended for lengthy use.  Over-the-counter sleep medicines are not recommended for lengthy use. They can be habit forming.  You can promote easier sleeping by making lifestyle changes such as:  Using relaxation techniques that help with breathing and reduce muscle tension.  Exercising earlier in the day.  Changing your diet and the time of your last meal. No night time snacks.  Establish a regular time to go to bed.  Counseling can help with stressful problems and worry.  Soothing music and white noise may be helpful if there are background noises you cannot remove.  Stop tedious detailed work at least one hour before bedtime. HOME CARE INSTRUCTIONS   Keep a diary. Inform your caregiver about your progress. This includes any medication side effects. See your caregiver regularly. Take note of:  Times when you are asleep.  Times when you are  awake during the night.  The quality of your sleep.  How you feel the next day. This information will help your caregiver care for you.  Get out of bed if you are still awake after 15 minutes. Read or do some quiet activity. Keep the lights down. Wait until you feel sleepy and go back to bed.  Keep regular sleeping and waking hours. Avoid naps.  Exercise regularly.  Avoid distractions at bedtime. Distractions include watching television or engaging in any intense or detailed activity like attempting to balance the household checkbook.  Develop a bedtime ritual. Keep a familiar routine of bathing, brushing your teeth, climbing into bed at the same time each night, listening to soothing music. Routines increase the success of falling to sleep faster.  Use relaxation techniques. This can be using breathing and muscle tension release routines. It can also include visualizing peaceful scenes. You can also help control troubling or intruding thoughts by keeping your mind occupied with boring or repetitive thoughts like the old concept of counting sheep. You can make it more creative like imagining planting one beautiful flower after another in your backyard garden.  During your day, work to eliminate stress. When this is not possible use some of the previous suggestions to help reduce the anxiety that accompanies stressful situations. MAKE SURE YOU:   Understand these instructions.  Will watch your condition.  Will get help right away if you are not doing well or get worse. Document Released: 06/22/2000 Document Revised: 09/17/2011 Document Reviewed: 07/23/2007 [facility] LLCExitCare Patient Information 2014 [place], [place].

## 2013-06-25 LAB — URINALYSIS, ROUTINE W REFLEX MICROSCOPIC
Bilirubin Urine: NEGATIVE
Glucose, UA: 100 mg/dL — AB
Hgb urine dipstick: NEGATIVE
Ketones, ur: NEGATIVE mg/dL
Leukocytes, UA: NEGATIVE
Protein, ur: NEGATIVE mg/dL
pH: 5.5 (ref 5.0–8.0)

## 2013-06-25 LAB — INSULIN, FASTING: Insulin fasting, serum: 56 u[IU]/mL — ABNORMAL HIGH (ref 3–28)

## 2013-06-25 LAB — MICROALBUMIN / CREATININE URINE RATIO: Microalb Creat Ratio: 9.2 mg/g (ref 0.0–30.0)

## 2013-06-29 NOTE — Progress Notes (Signed)
Subjective:    Patient ID: Glenn Stephens, male    DOB: [birth year], 57 y.o.   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 Deficient, hypogonadism. He is trying to improve diet except for breads. His BS has been good at home. Bp have also been good. He exercises with work only. He is down 2# dince last CPE. He has been unable to tolerate multiple statin RX due to myalgias but has been doing well with recent Zetia add on. He is unable to tolerate recommended dose of metformin due to stomach upset.   He has not had laser vein therapy repeated even though recommended due to cost. He denies any new or increased leg symptoms.  His wife notes he is snoring more and having increased movement in bed and awakenings. He notes mild a.m. faitgue.      Hypertension  Hyperlipidemia  Gastrophageal Reflux Associated symptoms include fatigue.    Current Outpatient Prescriptions on File Prior to Visit  Medication Sig Dispense Refill  . aspirin 81 MG tablet Take 81 mg by mouth daily.      . citalopram (CELEXA) 20 MG tablet Take 20 mg by mouth daily.      Marland Kitchen lisinopril (PRINIVIL,ZESTRIL) 40 MG tablet Take 40 mg by mouth daily.      . metFORMIN (GLUCOPHAGE) 500 MG tablet Take 500 mg by mouth 2 (two) times daily with a meal.      . omeprazole (PRILOSEC) 20 MG capsule Take 20 mg by mouth daily.      . Red Yeast Rice 600 MG CAPS Take 1,200 mg by mouth 2 (two) times daily.       No current facility-administered medications on file prior to visit.   ALLERGIES Caduet and Statins  Past Medical History  Diagnosis Date  . Diabetes mellitus without complication   . Cancer   . Hypertension   . Hyperlipidemia   . GERD (gastroesophageal reflux disease)   . Unspecified vitamin D deficiency     Past Surgical History  Procedure Laterality Date  . Orthopedic surgeries      knee,foot  . Laser ablation      veins    History  Substance  Use Topics  . Smoking status: Former Smoker    Quit date: 06/24/1993  . Smokeless tobacco: Not on file  . Alcohol Use: No    Family History  Problem Relation Age of Onset  . Cancer Mother     melanoma with mets   . Heart disease Father   . Hyperlipidemia Father   . Diabetes Sister   . Hypertension Brother      Review of Systems  Constitutional: Positive for fatigue.  HENT:       Snoring  Eyes:       [facility] 2014 wnl  Cardiovascular:       Dr Eden Emms PRN- Stress 2007 WNL Dr. Hart [place] PRN  Gastrointestinal:       Dr Elnoria Howard PRN  Musculoskeletal:       Dr. Ophelia Charter- PRN  Skin:       Dr. Lowella Fairy- 2014 Recheck every 3-6 m   Psychiatric/Behavioral: Positive for sleep disturbance.   BP 128/80  Pulse 84  Temp(Src) 98.2 F (36.8 C) (Temporal)  Resp 18  Ht 6' (1.829 m)  Wt 248 lb (112.492 kg)  BMI 33.63 kg/m2     Objective:   Physical Exam  Nursing  note and vitals reviewed. Constitutional: He is oriented to person, place, and time. He appears well-developed and well-nourished.  Overwt  HENT:  Head: Normocephalic and atraumatic.  Right Ear: External ear normal.  Left Ear: External ear normal.  Nose: Nose normal.  Mouth/Throat: Oropharynx is clear and moist. No oropharyngeal exudate.  Large tongue partially obstructs airway Left ear mildly injected  Eyes: Conjunctivae and EOM are normal. Pupils are equal, round, and reactive to light. Right eye exhibits no discharge. Left eye exhibits no discharge. No scleral icterus.  Neck: Normal range of motion. Neck supple. No JVD present. No tracheal deviation present. No thyromegaly present.  Cardiovascular: Normal rate, regular rhythm, normal heart sounds and intact distal pulses.   Pulmonary/Chest: Effort normal and breath sounds normal.  Abdominal: Soft. Bowel sounds are normal. He exhibits no distension and no mass. There is no tenderness. There is no rebound and no guarding.  Genitourinary:  Def 2015  Musculoskeletal: Normal  range of motion. He exhibits no edema and no tenderness.  Lymphadenopathy:    He has no cervical adenopathy.  Neurological: He is alert and oriented to person, place, and time. He has normal reflexes. No cranial nerve deficit. He exhibits normal muscle tone. Coordination normal.  Skin: Skin is warm and dry. No rash noted. No erythema. No pallor.  Psychiatric: He has a normal mood and affect. His behavior is normal. Judgment and thought content normal.      EKG NSCSPT    Assessment & Plan:  1.  CPE and 3 month F/U for HTN, Cholesterol,DM, D. Deficient. Needs healthy diet, cardio QD and obtain healthy weight. Check Labs, Check BP if >130/80 call office, Check BS if >200 call office 2. Sleep disturbance-OSA VS RLS- get sleep study, sleep hygiene explained. 3. Allergic rhinitis- Allegra OTC, increase H2o, allergy hygiene explained.

## 2013-07-06 ENCOUNTER — Encounter: Payer: Self-pay | Admitting: Emergency Medicine

## 2013-07-15 NOTE — Progress Notes (Signed)
LMOM TO CALL & SCHEDULE  

## 2013-07-16 ENCOUNTER — Telehealth: Payer: Self-pay | Admitting: Neurology

## 2013-07-16 ENCOUNTER — Telehealth: Payer: Self-pay

## 2013-07-16 DIAGNOSIS — G4752 REM sleep behavior disorder: Secondary | ICD-10-CM

## 2013-07-16 DIAGNOSIS — G4733 Obstructive sleep apnea (adult) (pediatric): Secondary | ICD-10-CM

## 2013-07-16 NOTE — Telephone Encounter (Signed)
Pt's wife called stating that appt was never made at [facility] because they needed pt to come in for OV before sleep study was performed. Pt's wife states that pt is busy w/ work and is requesting referral to [place]. Referral has been faxed to [place]. Pt's wife is advised to call us if she has not been contacted by the end of next week to sched appt. Pt's wife aware.

## 2013-07-16 NOTE — Telephone Encounter (Signed)
. °  Kelby Aline, PA-C is referring Glenn Stephens, 58 y.o. male, for an evaluation of sleep apnea.  Wt: 248  Ht: 72 in. BMI: 33.63  Diagnoses: Snoring Excessive Daytime Sleepiness Witnessed Apneas HTN Hyperlipidemia Diabetes Mellitus Type II Frequent Awakenings Fatigue Unwanted Behaviors In Sleep   Medication List: Current Outpatient Prescriptions  Medication Sig Dispense Refill   aspirin 81 MG tablet Take 81 mg by mouth daily.       cholecalciferol (VITAMIN D) 1000 UNITS tablet Take 1,000 Units by mouth daily.       citalopram (CELEXA) 20 MG tablet Take 20 mg by mouth daily.       ezetimibe (ZETIA) 10 MG tablet Take 10 mg by mouth daily.       lisinopril (PRINIVIL,ZESTRIL) 40 MG tablet Take 40 mg by mouth daily.       metFORMIN (GLUCOPHAGE) 500 MG tablet Take 500 mg by mouth 2 (two) times daily with a meal.       Omega-3 Fatty Acids (FISH OIL) 1000 MG CAPS Take by mouth 2 (two) times daily.       omeprazole (PRILOSEC) 20 MG capsule Take 20 mg by mouth daily.       Red Yeast Rice 600 MG CAPS Take 1,200 mg by mouth 2 (two) times daily.       vitamin E (VITAMIN E) 400 UNIT capsule Take 400 Units by mouth daily.       No current facility-administered medications for this visit.    This patient presents to Kelby Aline, PA-C with increasing complications with sleep.  Wife reports the patient is having an increase in nightmares and is acting out his dreams.  Leg movements during sleep are non-stop.  Pt is falling out of bed onto the floor some nights.  Chronic snoring associated with witnessed apneic events.  Physician notes that the patient has a large tongue that partially obstructs airway.  Kelby Aline, PA-C would like the patient to proceed with an attended study to rule out OSA/RLS.  Insurance:  [employer] - Covered at 100%

## 2013-07-22 ENCOUNTER — Ambulatory Visit (INDEPENDENT_AMBULATORY_CARE_PROVIDER_SITE_OTHER): Payer: BC Managed Care – PPO

## 2013-07-22 DIAGNOSIS — G4733 Obstructive sleep apnea (adult) (pediatric): Secondary | ICD-10-CM

## 2013-07-22 DIAGNOSIS — G4752 REM sleep behavior disorder: Secondary | ICD-10-CM

## 2013-07-22 DIAGNOSIS — G4761 Periodic limb movement disorder: Secondary | ICD-10-CM

## 2013-07-22 NOTE — Telephone Encounter (Signed)
This patient is approved for an attended sleep study based upon his significant need for video analysis because of the report of unwanted behaviors during sleep.  REM Behavior disorder must be ruled out or confirmed if possible, Parasomnia Montage is required for this patient.  His study may be split if he meet criteria and has 20 events per hour or greater after 2 hours of sleep - a 3% desaturation criteria should be used for hypopnea scoring per AASM criteria.  No CO2 measures are needed.  Good video capture is essential as well as tech's description of movements or vocalizations.   Also please ensure that leg leads stay intact throughout study.  In summary: [place] with good video capture Split at AHI of 20/hr after 2 hours of sleep  Score hypopneas with 3% desat criteria or an arousal from sleep  Dr. Brett Fairy will sign off on instructions upon her return.

## 2013-07-29 ENCOUNTER — Telehealth: Payer: Self-pay | Admitting: *Deleted

## 2013-07-29 DIAGNOSIS — G4761 Periodic limb movement disorder: Secondary | ICD-10-CM

## 2013-07-29 DIAGNOSIS — G4733 Obstructive sleep apnea (adult) (pediatric): Secondary | ICD-10-CM

## 2013-07-29 DIAGNOSIS — G4752 REM sleep behavior disorder: Secondary | ICD-10-CM

## 2013-07-29 NOTE — Telephone Encounter (Signed)
Left message for patient regarding sleep study results, asked patient to call me back to discuss results and have questions answered.  Explained that a copy of the sleep study was sent to referring physician and copy of study is coming to them in the mail.  Explained patient needed to follow up with Dr. Brett Fairy, asked him to please call me back.  Orders were entered for CPAP for this patient.

## 2013-08-03 ENCOUNTER — Other Ambulatory Visit: Payer: Self-pay | Admitting: Emergency Medicine

## 2013-08-03 ENCOUNTER — Encounter: Payer: Self-pay | Admitting: *Deleted

## 2013-08-03 ENCOUNTER — Encounter: Payer: Self-pay | Admitting: Emergency Medicine

## 2013-08-03 DIAGNOSIS — G4733 Obstructive sleep apnea (adult) (pediatric): Secondary | ICD-10-CM

## 2013-08-03 DIAGNOSIS — Z9989 Dependence on other enabling machines and devices: Principal | ICD-10-CM

## 2013-08-03 HISTORY — DX: Obstructive sleep apnea (adult) (pediatric): G47.33

## 2013-08-06 NOTE — Progress Notes (Signed)
LMOM TO CALL TO SCHEDULE  

## 2013-08-12 ENCOUNTER — Encounter: Payer: Self-pay | Admitting: Emergency Medicine

## 2013-08-19 ENCOUNTER — Encounter: Payer: Self-pay | Admitting: Emergency Medicine

## 2013-08-20 NOTE — Progress Notes (Signed)
LMOM TO CALL TO SCHEDULE  

## 2013-10-30 ENCOUNTER — Other Ambulatory Visit: Payer: Self-pay | Admitting: Internal Medicine

## 2014-02-01 ENCOUNTER — Encounter (HOSPITAL_COMMUNITY): Payer: Self-pay | Admitting: Emergency Medicine

## 2014-02-01 ENCOUNTER — Emergency Department (HOSPITAL_COMMUNITY)
Admission: EM | Admit: 2014-02-01 | Discharge: 2014-02-01 | Disposition: A | Discharge status: Home / Self Care | Payer: BC Managed Care – PPO | Attending: Emergency Medicine | Admitting: Emergency Medicine

## 2014-02-01 DIAGNOSIS — T4995XA Adverse effect of unspecified topical agent, initial encounter: Secondary | ICD-10-CM | POA: Insufficient documentation

## 2014-02-01 DIAGNOSIS — K219 Gastro-esophageal reflux disease without esophagitis: Secondary | ICD-10-CM | POA: Insufficient documentation

## 2014-02-01 DIAGNOSIS — E119 Type 2 diabetes mellitus without complications: Secondary | ICD-10-CM | POA: Insufficient documentation

## 2014-02-01 DIAGNOSIS — Z7982 Long term (current) use of aspirin: Secondary | ICD-10-CM | POA: Insufficient documentation

## 2014-02-01 DIAGNOSIS — Z79899 Other long term (current) drug therapy: Secondary | ICD-10-CM | POA: Insufficient documentation

## 2014-02-01 DIAGNOSIS — I10 Essential (primary) hypertension: Secondary | ICD-10-CM | POA: Insufficient documentation

## 2014-02-01 DIAGNOSIS — E785 Hyperlipidemia, unspecified: Secondary | ICD-10-CM | POA: Insufficient documentation

## 2014-02-01 DIAGNOSIS — Z87891 Personal history of nicotine dependence: Secondary | ICD-10-CM | POA: Insufficient documentation

## 2014-02-01 DIAGNOSIS — R221 Localized swelling, mass and lump, neck: Secondary | ICD-10-CM

## 2014-02-01 DIAGNOSIS — R220 Localized swelling, mass and lump, head: Secondary | ICD-10-CM | POA: Insufficient documentation

## 2014-02-01 DIAGNOSIS — Z859 Personal history of malignant neoplasm, unspecified: Secondary | ICD-10-CM | POA: Insufficient documentation

## 2014-02-01 DIAGNOSIS — G4733 Obstructive sleep apnea (adult) (pediatric): Secondary | ICD-10-CM | POA: Insufficient documentation

## 2014-02-01 DIAGNOSIS — R232 Flushing: Secondary | ICD-10-CM | POA: Insufficient documentation

## 2014-02-01 LAB — CBC
HCT: 37.5 % — ABNORMAL LOW (ref 39.0–52.0)
Hemoglobin: 13.3 g/dL (ref 13.0–17.0)
MCH: 29.4 pg (ref 26.0–34.0)
MCHC: 35.5 g/dL (ref 30.0–36.0)
MCV: 82.8 fL (ref 78.0–100.0)
PLATELETS: 197 10*3/uL (ref 150–400)
RBC: 4.53 MIL/uL (ref 4.22–5.81)
RDW: 12.7 % (ref 11.5–15.5)
WBC: 7.8 10*3/uL (ref 4.0–10.5)

## 2014-02-01 LAB — BASIC METABOLIC PANEL
ANION GAP: 12 (ref 5–15)
BUN: 19 mg/dL (ref 6–23)
CALCIUM: 9.6 mg/dL (ref 8.4–10.5)
CHLORIDE: 98 meq/L (ref 96–112)
CO2: 26 mEq/L (ref 19–32)
Creatinine, Ser: 1.11 mg/dL (ref 0.50–1.35)
GFR calc Af Amer: 82 mL/min — ABNORMAL LOW (ref >90)
GFR calc non Af Amer: 71 mL/min — ABNORMAL LOW (ref >90)
Glucose, Bld: 136 mg/dL — ABNORMAL HIGH (ref 70–99)
Potassium: 4.7 mEq/L (ref 3.7–5.3)
Sodium: 136 mEq/L — ABNORMAL LOW (ref 137–147)

## 2014-02-01 LAB — I-STAT CHEM 8, ED
BUN: 20 mg/dL (ref 6–23)
CHLORIDE: 101 meq/L (ref 96–112)
Calcium, Ion: 1.26 mmol/L — ABNORMAL HIGH (ref 1.12–1.23)
Creatinine, Ser: 1.2 mg/dL (ref 0.50–1.35)
GLUCOSE: 143 mg/dL — AB (ref 70–99)
HEMATOCRIT: 40 % (ref 39.0–52.0)
Hemoglobin: 13.6 g/dL (ref 13.0–17.0)
POTASSIUM: 4.8 meq/L (ref 3.7–5.3)
Sodium: 136 mEq/L — ABNORMAL LOW (ref 137–147)
TCO2: 27 mmol/L (ref 0–100)

## 2014-02-01 LAB — PRO B NATRIURETIC PEPTIDE: PRO B NATRI PEPTIDE: 12.3 pg/mL (ref 0–125)

## 2014-02-01 MED ORDER — FAMOTIDINE IN NACL 20-0.9 MG/50ML-% IV SOLN
20.0000 mg | Freq: Once | INTRAVENOUS | Status: AC
  Administered 2014-02-01: 20 mg via INTRAVENOUS
  Filled 2014-02-01: qty 50

## 2014-02-01 MED ORDER — DIPHENHYDRAMINE HCL 50 MG/ML IJ SOLN
25.0000 mg | Freq: Once | INTRAMUSCULAR | Status: AC
  Administered 2014-02-01: 25 mg via INTRAVENOUS
  Filled 2014-02-01: qty 1

## 2014-02-01 MED ORDER — METHYLPREDNISOLONE SODIUM SUCC 125 MG IJ SOLR
125.0000 mg | Freq: Once | INTRAMUSCULAR | Status: AC
  Administered 2014-02-01: 125 mg via INTRAVENOUS
  Filled 2014-02-01: qty 2

## 2014-02-01 MED ORDER — PREDNISONE 20 MG PO TABS: 40.0000 mg | ORAL_TABLET | Freq: Every day | ORAL | Status: DC

## 2014-02-01 NOTE — ED Notes (Signed)
Pt reports he is having an allergic reaction but is unsure of cause. Pt states his face and neck are more red and swollen than usual. Pt denies changing lotions, body wash, or detergent, eating new food or medications but he reports he has been taking Lisinopril for 6-7 years. Denies hx of this type of reaction before.

## 2014-02-01 NOTE — ED Notes (Signed)
Lisa, PA at bedside

## 2014-02-01 NOTE — ED Notes (Signed)
Brought pt back to room with family in tow; pt given a gown to put on; Ethelene Hal, Therapist, sports and Threasa Beards, RN present in room; family at bedside

## 2014-02-01 NOTE — Discharge Instructions (Signed)
Take the prescribed medication as directed.  Continue benadryl every 6 hours as needed. Follow-up with primary care physician. Return to the ED for new or worsening symptoms.

## 2014-02-01 NOTE — ED Provider Notes (Signed)
CSN: [id number]     Arrival date & time 02/01/14  1741 History   First MD Initiated Contact with Patient 02/01/14 1904     Chief Complaint  Patient presents with  . Allergic Reaction    The patient said he has been swollen in the face and it has gotten worse.  He denies SOB, or trouble breathing.  He denies any changes to medications, new foods, new soaps, colognes, or deodorants.     (Consider location/radiation/quality/duration/timing/severity/associated sxs/prior Treatment) Patient is a 58 y.o. male presenting with allergic reaction. The history is provided by the patient and medical records.  Allergic Reaction  This is a 58 year old male with past medical history significant for hypertension, hyperlipidemia, diabetes, GERD, presenting to the ED for possible allergic reaction. Patient states in the past several days he has felt that his face is swollen and erythematous, but is progressively getting worse. He denies any shortness of breath, sensation of throat swelling, or difficulty breathing. He denies any changes in soaps, lotions, detergents, or other personal care products. He denies any changes in medications or diet.  Wife notes he has been drinking more gatorade than normal due to the head.  Pt works for [employer], unknown sick exposures or contact exposures.  Denies recent bug bites, rashes, body aches, fever, chills, sweats, headaches, or confusion.  Pt is on daily ACEI (lisinopril) but has been on this for several years without problems.  Has not recently changed generic/namebrand or changed manufacturer.  Past Medical History  Diagnosis Date  . Diabetes mellitus without complication   . Cancer   . Hypertension   . Hyperlipidemia   . GERD (gastroesophageal reflux disease)   . Unspecified vitamin D deficiency   . OSA on CPAP 08/03/2013   Past Surgical History  Procedure Laterality Date  . Orthopedic surgeries      knee,foot  . Laser ablation      veins   Family History   Problem Relation Age of Onset  . Cancer Mother     melanoma with mets   . Heart disease Father   . Hyperlipidemia Father   . Diabetes Sister   . Hypertension Brother    History  Substance Use Topics  . Smoking status: Former Smoker    Quit date: 06/24/1993  . Smokeless tobacco: Not on file  . Alcohol Use: No    Review of Systems  Skin: Positive for color change.  All other systems reviewed and are negative.     Allergies  Caduet and Statins  Home Medications   Prior to Admission medications   Medication Sig Start Date End Date Taking? Authorizing Provider  aspirin 81 MG tablet Take 81 mg by mouth daily.   Yes Historical Provider, MD  cholecalciferol (VITAMIN D) 1000 UNITS tablet Take 1,000 Units by mouth daily.   Yes Historical Provider, MD  citalopram (CELEXA) 20 MG tablet Take 20 mg by mouth daily.   Yes Historical Provider, MD  ezetimibe (ZETIA) 10 MG tablet Take 10 mg by mouth daily.   Yes Historical Provider, MD  lisinopril (PRINIVIL,ZESTRIL) 10 MG tablet Take 10 mg by mouth daily.   Yes Historical Provider, MD  metFORMIN (GLUCOPHAGE) 500 MG tablet Take 500 mg by mouth 2 (two) times daily with a meal.   Yes Historical Provider, MD  Omega-3 Fatty Acids (FISH OIL) 1000 MG CAPS Take by mouth 2 (two) times daily.   Yes Historical Provider, MD  omeprazole (PRILOSEC) 20 MG capsule Take 20 mg by mouth  daily.   Yes Historical Provider, MD  Red Yeast Rice 600 MG CAPS Take 1,200 mg by mouth 2 (two) times daily.   Yes Historical Provider, MD  vitamin E (VITAMIN E) 400 UNIT capsule Take 400 Units by mouth daily.   Yes Historical Provider, MD   BP 132/76  Pulse 71  Temp(Src) 98.3 F (36.8 C) (Oral)  Resp 13  SpO2 100%  Physical Exam  Nursing note and vitals reviewed. Constitutional: He is oriented to person, place, and time. He appears well-developed and well-nourished. No distress.  HENT:  Head: Normocephalic and atraumatic.  Mouth/Throat: Oropharynx is clear and  moist.  Airway patent; no lip, tongue, or lip swelling, handling secretions well, no difficulty swallowing or speaking  Eyes: Conjunctivae, EOM and lids are normal. Pupils are equal, round, and reactive to light. Right conjunctiva is not injected. Left conjunctiva is not injected.  Neck: Normal range of motion.  No neck swelling or stridor; speaking in full complete sentences without difficulty  Cardiovascular: Normal rate, regular rhythm and normal heart sounds.   Pulmonary/Chest: Effort normal and breath sounds normal. No respiratory distress. He has no wheezes.  Abdominal: Soft. Bowel sounds are normal. There is no tenderness. There is no guarding.  Musculoskeletal: Normal range of motion.  Neurological: He is alert and oriented to person, place, and time.  Skin: Skin is warm and dry. No petechiae, no purpura and no rash noted. Rash is not macular, not papular, not pustular, not vesicular and not urticarial. He is not diaphoretic.  Face does appear flushed but no appreciable swelling noted; no visible rash of face, extremities, or trunk; no oral lesions; no skin sloughing, ulceration, or signs of necrosis  Psychiatric: He has a normal mood and affect.    ED Course  Procedures (including critical care time) Labs Review Labs Reviewed  CBC - Abnormal; Notable for the following:    HCT 37.5 (*)    All other components within normal limits  BASIC METABOLIC PANEL - Abnormal; Notable for the following:    Sodium 136 (*)    Glucose, Bld 136 (*)    GFR calc non Af Amer 71 (*)    GFR calc Af Amer 82 (*)    All other components within normal limits  I-STAT CHEM 8, ED - Abnormal; Notable for the following:    Sodium 136 (*)    Glucose, Bld 143 (*)    Calcium, Ion 1.26 (*)    All other components within normal limits  PRO B NATRIURETIC PEPTIDE    Imaging Review No results found.   EKG Interpretation None      MDM   Final diagnoses:  Facial flushing   58 y.o. M with questionable  allergic rxn.  Complains of facial flushing and swelling, however no appreciable swelling noted on exam. Airway is patent, there is no lip, tongue, or neck swelling. He is handling his secretions well and has no difficulty speaking or swallowing.  Labs were obtained which are reassuring.  Patient given IV Solu-Medrol, Benadryl, and Pepcid.  Will monitor closely.  On repeat evaluation, patient states he is feeling better. His face does appear less flushed. He remains without any airway compromise or respiratory distress, VS stable.  Patient is currently on lisinopril which he has been taking the past several years so concern for possible angioedema, however his symptoms today are not consistent with this.  Will start on steroid taper, continue benadryl at home.  Monitor sx closely over next 24-48  hours for oral swelling/airway compromise. FU with PCP for re-check.  Discussed plan with patient, he/she acknowledged understanding and agreed with plan of care.  Return precautions given for new or worsening symptoms.  Discussed with attending physician, Dr. Vanita Panda, who agrees with assessment and plan of care.  Larene Pickett, PA-C 02/01/14 2312

## 2014-02-01 NOTE — ED Notes (Signed)
The patient said he has been swollen in the face and it has gotten worse.  He denies SOB, or trouble breathing.  He denies any changes to medications, new foods, new soaps, colognes, or deodorants.  The patient is here to be evaluated.

## 2014-02-01 NOTE — ED Notes (Signed)
Pt denies any sob, itching, or difficulty swallowing upon discharge.

## 2014-02-02 ENCOUNTER — Ambulatory Visit: Payer: Self-pay | Admitting: Physician Assistant

## 2014-02-02 NOTE — ED Provider Notes (Signed)
  This was a shared visit with a mid-level provided (NP or PA).  Throughout the patient's course I was available for consultation/collaboration.  I saw the ECG (if appropriate), relevant labs and studies - I agree with the interpretation.  On my exam the patient was in no distress.  He had mild facial erythema, but no e/o respiratory compromise. He remained HD stable throughout his ED course, and was d/c w meds for likely allergic reaction.      Carmin Muskrat, MD 02/02/14 [phone]

## 2014-03-17 ENCOUNTER — Other Ambulatory Visit: Payer: Self-pay | Admitting: *Deleted

## 2014-03-17 MED ORDER — CITALOPRAM HYDROBROMIDE 20 MG PO TABS
20.0000 mg | ORAL_TABLET | Freq: Every day | ORAL | Status: DC
Start: 2014-03-17 — End: 2014-05-31

## 2014-03-17 MED ORDER — METFORMIN HCL 500 MG PO TABS: 500.0000 mg | ORAL_TABLET | Freq: Two times a day (BID) | ORAL | Status: DC

## 2014-03-17 MED ORDER — OMEPRAZOLE 20 MG PO CPDR: 20.0000 mg | DELAYED_RELEASE_CAPSULE | Freq: Every day | ORAL | Status: DC

## 2014-03-18 ENCOUNTER — Telehealth: Payer: Self-pay | Admitting: Internal Medicine

## 2014-03-18 ENCOUNTER — Other Ambulatory Visit: Payer: Self-pay | Admitting: Internal Medicine

## 2014-04-20 ENCOUNTER — Other Ambulatory Visit: Payer: BC Managed Care – PPO

## 2014-04-20 DIAGNOSIS — E785 Hyperlipidemia, unspecified: Secondary | ICD-10-CM

## 2014-04-20 DIAGNOSIS — E119 Type 2 diabetes mellitus without complications: Secondary | ICD-10-CM

## 2014-04-20 DIAGNOSIS — Z79899 Other long term (current) drug therapy: Secondary | ICD-10-CM

## 2014-04-20 DIAGNOSIS — E559 Vitamin D deficiency, unspecified: Secondary | ICD-10-CM

## 2014-04-20 DIAGNOSIS — I10 Essential (primary) hypertension: Secondary | ICD-10-CM

## 2014-04-20 LAB — CBC WITH DIFFERENTIAL/PLATELET
Basophils Absolute: 0 10*3/uL (ref 0.0–0.1)
Basophils Relative: 0 % (ref 0–1)
EOS ABS: 0.2 10*3/uL (ref 0.0–0.7)
Eosinophils Relative: 3 % (ref 0–5)
HCT: 37.9 % — ABNORMAL LOW (ref 39.0–52.0)
HEMOGLOBIN: 13.1 g/dL (ref 13.0–17.0)
LYMPHS ABS: 2.8 10*3/uL (ref 0.7–4.0)
LYMPHS PCT: 46 % (ref 12–46)
MCH: 28.5 pg (ref 26.0–34.0)
MCHC: 34.6 g/dL (ref 30.0–36.0)
MCV: 82.6 fL (ref 78.0–100.0)
Monocytes Absolute: 0.3 10*3/uL (ref 0.1–1.0)
Monocytes Relative: 5 % (ref 3–12)
NEUTROS ABS: 2.8 10*3/uL (ref 1.7–7.7)
NEUTROS PCT: 46 % (ref 43–77)
PLATELETS: 184 10*3/uL (ref 150–400)
RBC: 4.59 MIL/uL (ref 4.22–5.81)
RDW: 13.5 % (ref 11.5–15.5)
WBC: 6 10*3/uL (ref 4.0–10.5)

## 2014-04-20 LAB — HEMOGLOBIN A1C
Hgb A1c MFr Bld: 7.2 % — ABNORMAL HIGH (ref <5.7)
Mean Plasma Glucose: 160 mg/dL — ABNORMAL HIGH (ref <117)

## 2014-04-21 ENCOUNTER — Ambulatory Visit (INDEPENDENT_AMBULATORY_CARE_PROVIDER_SITE_OTHER): Payer: BC Managed Care – PPO | Admitting: Physician Assistant

## 2014-04-21 VITALS — BP 140/80 | HR 80 | Temp 97.7°F | Resp 16 | Wt 249.0 lb

## 2014-04-21 DIAGNOSIS — Z79899 Other long term (current) drug therapy: Secondary | ICD-10-CM

## 2014-04-21 DIAGNOSIS — I10 Essential (primary) hypertension: Secondary | ICD-10-CM

## 2014-04-21 DIAGNOSIS — E559 Vitamin D deficiency, unspecified: Secondary | ICD-10-CM

## 2014-04-21 DIAGNOSIS — E785 Hyperlipidemia, unspecified: Secondary | ICD-10-CM

## 2014-04-21 DIAGNOSIS — E119 Type 2 diabetes mellitus without complications: Secondary | ICD-10-CM

## 2014-04-21 LAB — BASIC METABOLIC PANEL WITH GFR
BUN: 9 mg/dL (ref 6–23)
CALCIUM: 9.5 mg/dL (ref 8.4–10.5)
CO2: 28 mEq/L (ref 19–32)
Chloride: 100 mEq/L (ref 96–112)
Creat: 0.97 mg/dL (ref 0.50–1.35)
GFR, EST NON AFRICAN AMERICAN: 85 mL/min
Glucose, Bld: 147 mg/dL — ABNORMAL HIGH (ref 70–99)
Potassium: 4.3 mEq/L (ref 3.5–5.3)
Sodium: 136 mEq/L (ref 135–145)

## 2014-04-21 LAB — LIPID PANEL
CHOL/HDL RATIO: 6.2 ratio
CHOLESTEROL: 199 mg/dL (ref 0–200)
HDL: 32 mg/dL — AB (ref >39)
LDL Cholesterol: 90 mg/dL (ref 0–99)
Triglycerides: 383 mg/dL — ABNORMAL HIGH (ref <150)
VLDL: 77 mg/dL — ABNORMAL HIGH (ref 0–40)

## 2014-04-21 LAB — HEPATIC FUNCTION PANEL
ALBUMIN: 4 g/dL (ref 3.5–5.2)
ALT: 22 U/L (ref 0–53)
AST: 18 U/L (ref 0–37)
Alkaline Phosphatase: 44 U/L (ref 39–117)
Bilirubin, Direct: 0.1 mg/dL (ref 0.0–0.3)
Indirect Bilirubin: 0.7 mg/dL (ref 0.2–1.2)
TOTAL PROTEIN: 6.6 g/dL (ref 6.0–8.3)
Total Bilirubin: 0.8 mg/dL (ref 0.2–1.2)

## 2014-04-21 LAB — MAGNESIUM: Magnesium: 1.6 mg/dL (ref 1.5–2.5)

## 2014-04-21 LAB — VITAMIN D 25 HYDROXY (VIT D DEFICIENCY, FRACTURES): VIT D 25 HYDROXY: 51 ng/mL (ref 30–89)

## 2014-04-21 LAB — TSH: TSH: 2.85 u[IU]/mL (ref 0.350–4.500)

## 2014-04-21 LAB — INSULIN, FASTING: Insulin fasting, serum: 9.2 u[IU]/mL (ref 2.0–19.6)

## 2014-04-21 NOTE — Patient Instructions (Addendum)
Benefiber is good for constipation/diarrhea/irritable bowel syndrome, it helps with weight loss and can help lower your bad cholesterol. Please do 1-2 TBSP in the morning in water, coffee, or tea. It can take up to a month before you can see a difference with your bowel movements. It is cheapest from [employer], [employer], [employer].   Your LDL is not in range. Your LDL is the bad cholesterol that can lead to heart attack and stroke. To lower your number you can decrease your fatty foods, red meat, cheese, milk and increase fiber like whole grains and veggies. You can also add a fiber supplement like Benefiber.    Please start 100 of Invokana for 1 week then go up to 300mg  of Invokana. This can decrease your blood pressure so please contact us if you have any dizziness. You are peeing out 300-400 calories a day of sugar which can lead to yeast infections, you can take the diflucan as needed but if the infections continue we will stop the medications. We will have you follow up in 1 month to check your kidney function and weight. Call if you need anything.     Bad carbs also include fruit juice, alcohol, and sweet tea. These are empty calories that do not signal to your brain that you are full.   Please remember the good carbs are still carbs which convert into sugar. So please measure them out no more than 1/2-1 cup of rice, oatmeal, pasta, and beans.  Veggies are however free foods! Pile them on.   I like lean protein at every meal such as chicken, [place], pork chops, cottage cheese, etc. Just do not fry these meats and please center your meal around vegetable, the meats should be a side dish.   No all fruit is created equal. Please see the list below, the fruit at the bottom is higher in sugars than the fruit at the top

## 2014-04-21 NOTE — Progress Notes (Signed)
Assessment and Plan:  Hypertension: Continue medication, monitor blood pressure at home. Continue DASH diet. Cholesterol: Continue diet and exercise. Check cholesterol.  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 Add invokana, check BMP 1 month.  Vitamin D Def- check level and continue medications.  Obesity with co morbidities- long discussion about weight loss, diet, and exercise OSA NOT on CPAP, discussed need to get on it, will think about it.   Continue diet and meds as discussed.   HPI 58 y.o. male  presents for 3 month follow up with hypertension, hyperlipidemia, prediabetes and vitamin D. His blood pressure is normally controlled at home however he has been out of his BP med for 3 days, today their BP is BP: 140/80 mmHg He does workout. He denies chest pain, shortness of breath, dizziness.  He is not on cholesterol medication and denies myalgias. His cholesterol is not at goal. The cholesterol last visit was:   Lab Results  Component Value Date   CHOL 199 04/20/2014   HDL 32* 04/20/2014   LDLCALC 90 04/20/2014   TRIG 383* 04/20/2014   CHOLHDL 6.2 04/20/2014   He has been working on diet and exercise for diabetes with CKD, and denies paresthesia of the feet, polydipsia, polyuria and visual disturbances. Last A1C in the office was:  Lab Results  Component Value Date   HGBA1C 7.2* 04/20/2014   Patient is on Vitamin D supplement.   Lab Results  Component Value Date   VD25OH 51 04/20/2014     OSA not on CPAP.  BMI is Body mass index is 33.76 kg/(m^2)., he is working on diet and exercise and has done well.  Wt Readings from Last 3 Encounters:  04/21/14 249 lb (112.946 kg)  06/24/13 248 lb (112.492 kg)  03/17/13 248 lb (112.492 kg)    Current Medications:  Current Outpatient Prescriptions on File Prior to Visit  Medication Sig Dispense Refill  . aspirin 81 MG tablet Take 81 mg by mouth daily.      . cholecalciferol (VITAMIN D) 1000 UNITS tablet Take 1,000  Units by mouth daily.      . citalopram (CELEXA) 20 MG tablet Take 1 tablet (20 mg total) by mouth daily.  30 tablet  0  . ezetimibe (ZETIA) 10 MG tablet Take 10 mg by mouth daily.      Marland Kitchen lisinopril (PRINIVIL,ZESTRIL) 10 MG tablet Take 10 mg by mouth daily.      . metFORMIN (GLUCOPHAGE) 500 MG tablet Take 1 tablet (500 mg total) by mouth 2 (two) times daily with a meal.  60 tablet  0  . omeprazole (PRILOSEC) 20 MG capsule Take 1 capsule (20 mg total) by mouth daily.  30 capsule  0  . vitamin E (VITAMIN E) 400 UNIT capsule Take 400 Units by mouth daily.       No current facility-administered medications on file prior to visit.   Medical History:  Past Medical History  Diagnosis Date  . Diabetes mellitus without complication   . Cancer   . Hypertension   . Hyperlipidemia   . GERD (gastroesophageal reflux disease)   . Unspecified vitamin D deficiency   . OSA on CPAP 08/03/2013   Allergies:  Allergies  Allergen Reactions  . Caduet [Amlodipine-Atorvastatin]   . Statins     BLURRED VISION     Review of Systems: see HPI  Family history- Review and unchanged Social history- Review and unchanged Physical Exam: BP 140/80  Pulse 80  Temp(Src) 97.7 F (  36.5 C)  Resp 16  Wt 249 lb (112.946 kg) Wt Readings from Last 3 Encounters:  04/21/14 249 lb (112.946 kg)  06/24/13 248 lb (112.492 kg)  03/17/13 248 lb (112.492 kg)   General Appearance: Well nourished, in no apparent distress. Eyes: PERRLA, EOMs, conjunctiva no swelling or erythema Sinuses: No Frontal/maxillary tenderness ENT/Mouth: Ext aud canals clear, TMs without erythema, bulging. No erythema, swelling, or exudate on post pharynx.  Tonsils not swollen or erythematous. Hearing normal.  Neck: Supple, thyroid normal.  Respiratory: Respiratory effort normal, BS equal bilaterally without rales, rhonchi, wheezing or stridor.  Cardio: RRR with no MRGs. Brisk peripheral pulses without edema.  Abdomen: Soft, + BS.  Non tender, no  guarding, rebound, hernias, masses. Lymphatics: Non tender without lymphadenopathy.  Musculoskeletal: Full ROM, 5/5 strength, normal gait.  Skin: Warm, dry without rashes, lesions, ecchymosis.  Neuro: Cranial nerves intact. Normal muscle tone, no cerebellar symptoms. Sensation intact.  Psych: Awake and oriented X 3, normal affect, Insight and Judgment appropriate.    Vicie Mutters 12:06 PM

## 2014-04-22 ENCOUNTER — Other Ambulatory Visit: Payer: Self-pay

## 2014-04-22 MED ORDER — OMEPRAZOLE 20 MG PO CPDR: 20.0000 mg | DELAYED_RELEASE_CAPSULE | Freq: Every day | ORAL | Status: DC

## 2014-04-22 MED ORDER — LISINOPRIL 10 MG PO TABS: 10.0000 mg | ORAL_TABLET | Freq: Every day | ORAL | Status: DC

## 2014-04-23 ENCOUNTER — Other Ambulatory Visit: Payer: Self-pay

## 2014-05-17 DIAGNOSIS — Z79899 Other long term (current) drug therapy: Secondary | ICD-10-CM

## 2014-05-18 ENCOUNTER — Other Ambulatory Visit: Payer: Self-pay

## 2014-05-18 ENCOUNTER — Other Ambulatory Visit: Payer: BC Managed Care – PPO

## 2014-05-18 DIAGNOSIS — Z79899 Other long term (current) drug therapy: Secondary | ICD-10-CM

## 2014-05-18 LAB — BASIC METABOLIC PANEL WITH GFR
BUN: 15 mg/dL (ref 6–23)
CALCIUM: 9.6 mg/dL (ref 8.4–10.5)
CO2: 26 meq/L (ref 19–32)
Chloride: 102 mEq/L (ref 96–112)
Creat: 0.88 mg/dL (ref 0.50–1.35)
GFR, Est African American: >89 mL/min
GFR, Est Non African American: >89 mL/min
Glucose, Bld: 166 mg/dL — ABNORMAL HIGH (ref 70–99)
POTASSIUM: 4.2 meq/L (ref 3.5–5.3)
SODIUM: 137 meq/L (ref 135–145)

## 2014-05-18 MED ORDER — CANAGLIFLOZIN 300 MG PO TABS
300.0000 mg | ORAL_TABLET | Freq: Every day | ORAL | Status: DC
Start: 2014-05-18 — End: 2014-07-06

## 2014-05-19 ENCOUNTER — Encounter: Payer: Self-pay | Admitting: Physician Assistant

## 2014-05-31 ENCOUNTER — Other Ambulatory Visit: Payer: Self-pay | Admitting: *Deleted

## 2014-05-31 MED ORDER — CITALOPRAM HYDROBROMIDE 20 MG PO TABS: 20.0000 mg | ORAL_TABLET | Freq: Every day | ORAL | Status: DC

## 2014-06-02 ENCOUNTER — Encounter: Payer: Self-pay | Admitting: Emergency Medicine

## 2014-06-08 ENCOUNTER — Encounter: Payer: Self-pay | Admitting: Emergency Medicine

## 2014-06-29 ENCOUNTER — Encounter: Payer: Self-pay | Admitting: Emergency Medicine

## 2014-06-29 NOTE — Telephone Encounter (Signed)
ERROR

## 2014-07-06 ENCOUNTER — Other Ambulatory Visit: Payer: Self-pay

## 2014-07-06 MED ORDER — CANAGLIFLOZIN 300 MG PO TABS
300.0000 mg | ORAL_TABLET | Freq: Every day | ORAL | Status: DC
Start: 2014-07-06 — End: 2014-07-07

## 2014-07-07 ENCOUNTER — Other Ambulatory Visit: Payer: Self-pay

## 2014-07-07 MED ORDER — CANAGLIFLOZIN 300 MG PO TABS: 300.0000 mg | ORAL_TABLET | Freq: Every day | ORAL | Status: DC

## 2014-10-17 ENCOUNTER — Encounter: Payer: Self-pay | Admitting: *Deleted

## 2014-12-28 ENCOUNTER — Encounter: Payer: Self-pay | Admitting: Physician Assistant

## 2014-12-28 DIAGNOSIS — Z85828 Personal history of other malignant neoplasm of skin: Secondary | ICD-10-CM | POA: Insufficient documentation

## 2015-01-03 ENCOUNTER — Other Ambulatory Visit: Payer: Self-pay

## 2015-01-10 ENCOUNTER — Encounter: Payer: Self-pay | Admitting: *Deleted

## 2015-03-24 ENCOUNTER — Ambulatory Visit (INDEPENDENT_AMBULATORY_CARE_PROVIDER_SITE_OTHER): Payer: BLUE CROSS/BLUE SHIELD | Admitting: Physician Assistant

## 2015-03-24 ENCOUNTER — Encounter: Payer: Self-pay | Admitting: Physician Assistant

## 2015-03-24 VITALS — BP 142/70 | HR 92 | Temp 97.5°F | Resp 16 | Ht 72.0 in | Wt 243.0 lb

## 2015-03-24 DIAGNOSIS — I10 Essential (primary) hypertension: Secondary | ICD-10-CM | POA: Diagnosis not present

## 2015-03-24 DIAGNOSIS — Z79899 Other long term (current) drug therapy: Secondary | ICD-10-CM

## 2015-03-24 DIAGNOSIS — E559 Vitamin D deficiency, unspecified: Secondary | ICD-10-CM

## 2015-03-24 DIAGNOSIS — E119 Type 2 diabetes mellitus without complications: Secondary | ICD-10-CM

## 2015-03-24 DIAGNOSIS — Z9989 Dependence on other enabling machines and devices: Secondary | ICD-10-CM

## 2015-03-24 DIAGNOSIS — E785 Hyperlipidemia, unspecified: Secondary | ICD-10-CM

## 2015-03-24 DIAGNOSIS — Z85828 Personal history of other malignant neoplasm of skin: Secondary | ICD-10-CM

## 2015-03-24 DIAGNOSIS — K219 Gastro-esophageal reflux disease without esophagitis: Secondary | ICD-10-CM

## 2015-03-24 DIAGNOSIS — F411 Generalized anxiety disorder: Secondary | ICD-10-CM

## 2015-03-24 DIAGNOSIS — M25561 Pain in right knee: Secondary | ICD-10-CM

## 2015-03-24 DIAGNOSIS — Z125 Encounter for screening for malignant neoplasm of prostate: Secondary | ICD-10-CM | POA: Diagnosis not present

## 2015-03-24 DIAGNOSIS — D649 Anemia, unspecified: Secondary | ICD-10-CM

## 2015-03-24 DIAGNOSIS — Z0001 Encounter for general adult medical examination with abnormal findings: Secondary | ICD-10-CM

## 2015-03-24 DIAGNOSIS — G4733 Obstructive sleep apnea (adult) (pediatric): Secondary | ICD-10-CM

## 2015-03-24 DIAGNOSIS — E669 Obesity, unspecified: Secondary | ICD-10-CM | POA: Insufficient documentation

## 2015-03-24 DIAGNOSIS — Z0000 Encounter for general adult medical examination without abnormal findings: Secondary | ICD-10-CM | POA: Diagnosis not present

## 2015-03-24 DIAGNOSIS — E291 Testicular hypofunction: Secondary | ICD-10-CM

## 2015-03-24 DIAGNOSIS — I8390 Asymptomatic varicose veins of unspecified lower extremity: Secondary | ICD-10-CM

## 2015-03-24 LAB — CBC WITH DIFFERENTIAL/PLATELET
Basophils Absolute: 0 10*3/uL (ref 0.0–0.1)
Basophils Relative: 0 % (ref 0–1)
EOS PCT: 3 % (ref 0–5)
Eosinophils Absolute: 0.2 10*3/uL (ref 0.0–0.7)
HCT: 41.7 % (ref 39.0–52.0)
Hemoglobin: 14.1 g/dL (ref 13.0–17.0)
Lymphocytes Relative: 37 % (ref 12–46)
Lymphs Abs: 2.4 10*3/uL (ref 0.7–4.0)
MCH: 28.4 pg (ref 26.0–34.0)
MCHC: 33.8 g/dL (ref 30.0–36.0)
MCV: 84.1 fL (ref 78.0–100.0)
MPV: 9.6 fL (ref 8.6–12.4)
Monocytes Absolute: 0.5 10*3/uL (ref 0.1–1.0)
Monocytes Relative: 8 % (ref 3–12)
Neutro Abs: 3.4 10*3/uL (ref 1.7–7.7)
Neutrophils Relative %: 52 % (ref 43–77)
Platelets: 191 10*3/uL (ref 150–400)
RBC: 4.96 MIL/uL (ref 4.22–5.81)
RDW: 13.7 % (ref 11.5–15.5)
WBC: 6.5 10*3/uL (ref 4.0–10.5)

## 2015-03-24 MED ORDER — LISINOPRIL 10 MG PO TABS: 10.0000 mg | ORAL_TABLET | Freq: Every day | ORAL | Status: DC

## 2015-03-24 MED ORDER — CITALOPRAM HYDROBROMIDE 20 MG PO TABS: 20.0000 mg | ORAL_TABLET | Freq: Every day | ORAL | Status: DC

## 2015-03-24 MED ORDER — CANAGLIFLOZIN 300 MG PO TABS: 300.0000 mg | ORAL_TABLET | Freq: Every day | ORAL | Status: DC

## 2015-03-24 MED ORDER — OMEPRAZOLE 20 MG PO CPDR: 20.0000 mg | DELAYED_RELEASE_CAPSULE | Freq: Every day | ORAL | Status: DC

## 2015-03-24 NOTE — Progress Notes (Signed)
Complete Physical  Assessment and Plan: 1. Diabetes mellitus without complication Discussed general issues about diabetes pathophysiology and management., Educational material distributed., Suggested low cholesterol diet., Encouraged aerobic exercise., Discussed foot care., Reminded to get yearly retinal exam. - Hemoglobin A1c - Insulin, fasting - LOW EXTREMITY NEUR EXAM DOCUM  2. Hyperlipidemia -continue medications, check lipids, decrease fatty foods, increase activity.  - Lipid panel  3. Essential hypertension - continue medications, DASH diet, exercise and monitor at home. Call if greater than 130/80.  - CBC with Differential/Platelet - BASIC METABOLIC PANEL WITH GFR - Hepatic function panel - TSH - Urinalysis, Routine w reflex microscopic (not at [facility]) - Microalbumin / creatinine urine ratio - EKG 12-Lead - [place], RETROPERITNL ABD,  LTD  4. Obesity Obesity with co morbidities- long discussion about weight loss, diet, and exercise  5. OSA NOT on CPAP Stressed importance of CPAP  6. Gastroesophageal reflux disease, esophagitis presence not specified Continue PPI/H2 blocker, diet discussed  7. Vitamin D deficiency - Vit D  25 hydroxy (rtn osteoporosis monitoring)  8. History of SCC (squamous cell carcinoma) of skin Follow up DERM  9. Varicose veins Varicose veins- weight loss discussed, continue compression stockings and elevation\  10. Encounter for general adult medical examination with abnormal findings - CBC with Differential/Platelet - BASIC METABOLIC PANEL WITH GFR - Hepatic function panel - TSH - Lipid panel - Hemoglobin A1c - Insulin, fasting - Magnesium - Vit D  25 hydroxy (rtn osteoporosis monitoring) - Urinalysis, Routine w reflex microscopic (not at [facility]) - Microalbumin / creatinine urine ratio - Iron and TIBC - Ferritin - Vitamin B12 - PSA - EKG 12-Lead - [place], RETROPERITNL ABD,  LTD - Uric acid - LOW EXTREMITY NEUR EXAM DOCUM  11. Medication  management - Magnesium  12. Anemia, unspecified anemia type - Iron and TIBC - Ferritin - Vitamin B12  13. Pain in joint, lower leg, right - Uric acid  14. Screening for prostate cancer - PSA  Discussed med's effects and SE's. Screening labs and tests as requested with regular follow-up as recommended. Over 40 minutes of exam, counseling, chart review and critical decision making was performed  HPI Patient presents for a complete physical.  59 y.o. WM.  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 He denies chest pain, shortness of breath, dizziness.  He is not on cholesterol medication and denies myalgias. His cholesterol is at goal. The cholesterol last visit was:   Lab Results  Component Value Date   CHOL 199 04/20/2014   HDL 32* 04/20/2014   LDLCALC 90 04/20/2014   TRIG 383* 04/20/2014   CHOLHDL 6.2 04/20/2014   He has been working on diet and exercise for diabetes with CKD, he is on bASA, he is on ACE/ARB and denies paresthesia of the feet, polydipsia, polyuria and visual disturbances. Last A1C in the office was:  Lab Results  Component Value Date   HGBA1C 7.2* 04/20/2014   Lab Results  Component Value Date   GFRNONAA >89 05/18/2014   Patient is on Vitamin D supplement.   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 Has knee and back pain.  BMI is Body mass index is 32.95 kg/(m^2)., he is working on diet and exercise. He has OSA, not on CPAP.  Wt Readings from Last 3 Encounters:  03/24/15 243 lb (110.224 kg)  04/21/14 249 lb (112.946 kg)  06/24/13 248 lb (112.492 kg)    Current Medications:  Current  Outpatient Prescriptions on File Prior to Visit  Medication Sig Dispense Refill  . aspirin 81 MG tablet Take 81 mg by mouth daily.    . canagliflozin (INVOKANA) 300 MG TABS tablet Take 300 mg by mouth daily before breakfast. 90 tablet prn  . citalopram (CELEXA) 20 MG tablet Take 1 tablet (20 mg total) by mouth daily. 90 tablet 1  . lisinopril (PRINIVIL,ZESTRIL) 10 MG tablet Take 1 tablet (10 mg total) by mouth daily. 90 tablet 3  . omeprazole (PRILOSEC) 20 MG capsule Take 1 capsule (20 mg total) by mouth daily. 90 capsule 3   No current facility-administered medications on file prior to visit.   Health Maintenance:  Immunization History  Administered Date(s) Administered  . Influenza-Unspecified 05/07/2013  . Pneumococcal Polysaccharide-23 06/24/2013  . Td 07/09/2006   Tetanus: 2008 Pneumovax: 2014 Prevnar 13: Flu vaccine: 2015 will get on the 20th with TB test Zostavax: DEXA: Colonoscopy: Dr. Benson Norway 2008  EGD: Eye Exam: [facility] 2016 Dentist:  Patient Care Team: Unk Pinto, MD as PCP - General (Internal Medicine)  Dr. Johnsie Cancel 2007 normal stress test Dr. Kellie Simmering Dr. Benson Norway Dr. Lorin Mercy  Dr. Kristian Covey  Allergies:  Allergies  Allergen Reactions  . Caduet [Amlodipine-Atorvastatin]   . Statins     BLURRED VISION   Medical History:  Past Medical History  Diagnosis Date  . Diabetes mellitus without complication   . Cancer   . Hypertension   . Hyperlipidemia   . GERD (gastroesophageal reflux disease)   . Unspecified vitamin D deficiency   . OSA on CPAP 08/03/2013   Surgical History:  Past Surgical History  Procedure Laterality Date  . Orthopedic surgeries      knee,foot  . Laser ablation      veins   Family History:  Family History  Problem Relation Age of Onset  . Cancer Mother     melanoma with mets   . Heart disease Father   . Hyperlipidemia Father   . Diabetes Sister   . Hypertension Brother    Social History:   Social History  Substance Use  Topics  . Smoking status: Former Smoker    Quit date: 06/24/1993  . Smokeless tobacco: None  . Alcohol Use: No   Review of Systems:  Review of Systems  Constitutional: Negative.   HENT: Positive for congestion. Negative for ear discharge, ear pain, hearing loss, nosebleeds, sore throat and tinnitus.   Eyes: Negative.   Respiratory: Negative.  Negative for cough, shortness of breath and stridor.   Cardiovascular: Positive for leg swelling. Negative for chest pain, palpitations, orthopnea, claudication and PND.  Gastrointestinal: Negative.   Genitourinary: Negative.  Negative for urgency and frequency.  Musculoskeletal: Positive for back pain and joint pain. Negative for myalgias, falls and neck pain.  Skin: Negative.   Neurological: Negative.  Negative for dizziness and headaches.  Psychiatric/Behavioral: Negative for depression, suicidal ideas, hallucinations, memory loss and substance abuse. The patient has insomnia. The patient is not nervous/anxious.     Physical Exam: Estimated body mass index is 32.95 kg/(m^2) as calculated from the following:   Height as of this encounter:  6' (1.829 m).   Weight as of this encounter: 243 lb (110.224 kg). BP 142/70 mmHg  Pulse 92  Temp(Src) 97.5 F (36.4 C)  Resp 16  Ht 6' (1.829 m)  Wt 243 lb (110.224 kg)  BMI 32.95 kg/m2 General Appearance: Well nourished, in no apparent distress.  Eyes: PERRLA, EOMs, conjunctiva no swelling or erythema, normal fundi and vessels.  Sinuses: No Frontal/maxillary tenderness  ENT/Mouth: Ext aud canals clear, normal light reflex with TMs without erythema, bulging. Good dentition. No erythema, swelling, or exudate on post pharynx. Tonsils not swollen or erythematous. Hearing normal.  Neck: Supple, thyroid normal. No bruits  Respiratory: Respiratory effort normal, BS equal bilaterally without rales, rhonchi, wheezing or stridor.  Cardio: RRR without murmurs, rubs or gallops. Brisk peripheral pulses without  edema.  Chest: symmetric, with normal excursions and percussion.  Abdomen: Soft, nontender, no guarding, rebound, hernias, masses, or organomegaly.  Lymphatics: Non tender without lymphadenopathy.  Genitourinary: defer Musculoskeletal: Full ROM all peripheral extremities,5/5 strength, and normal gait.Bilateral varicose veins.   Skin: Warm, dry without rashes, lesions, ecchymosis. Neuro: Cranial nerves intact, reflexes equal bilaterally. Normal muscle tone, no cerebellar symptoms. Sensation intact.  Psych: Awake and oriented X 3, normal affect, Insight and Judgment appropriate.   EKG: WNL no changes. AORTA SCAN: WNL  Vicie Mutters 2:03 PM [facility]

## 2015-03-24 NOTE — Patient Instructions (Addendum)
Diabetes is a very complicated disease...lets simplify it.  An easy way to look at it to understand the complications is if you think of the extra sugar floating in your blood stream as glass shards floating through your blood stream.    Diabetes affects your small vessels first: 1) The glass shards (sugar) scraps down the tiny blood vessels in your eyes and lead to diabetic retinopathy, the leading cause of blindness in the [place]. Diabetes is the leading cause of newly diagnosed adult (33 to 60 years of age) blindness in the [place].  2) The glass shards scratches down the tiny vessels of your legs leading to nerve damage called neuropathy and can lead to amputations of your feet. More than 60% of all non-traumatic amputations of lower limbs occur in people with diabetes.  3) Over time the small vessels in your brain are shredded and closed off, individually this does not cause any problems but over a long period of time many of the small vessels being blocked can lead to Vascular Dementia.   4) Your kidney's are a filter system and have a "net" that keeps certain things in the body and lets bad things out. Sugar shreds this net and leads to kidney damage and eventually failure. Decreasing the sugar that is destroying the net and certain blood pressure medications can help stop or decrease progression of kidney disease. Diabetes was the primary cause of kidney failure in 44 percent of all new cases in 2011.  5) Diabetes also destroys the small vessels in your penis that lead to erectile dysfunction. Eventually the vessels are so damaged that you may not be responsive to cialis or viagra.   Diabetes and your large vessels: Your larger vessels consist of your coronary arteries in your heart and the carotid vessels to your brain. Diabetes or even increased sugars put you at 300% increased risk of heart attack and stroke and this is why.. The sugar scrapes down your large blood vessels and your body  sees this as an internal injury and tries to repair itself. Just like you get a scab on your skin, your platelets will stick to the blood vessel wall trying to heal it. This is why we have diabetics on low dose aspirin daily, this prevents the platelets from sticking and can prevent plaque formation. In addition, your body takes cholesterol and tries to shove it into the open wound. This is why we want your LDL, or bad cholesterol, below 70.   The combination of platelets and cholesterol over 5-10 years forms plaque that can break off and cause a heart attack or stroke.   PLEASE REMEMBER:  Diabetes is preventable! Up to 40 percent of complications and morbidities among individuals with type 2 diabetes can be prevented, delayed, or effectively treated and minimized with regular visits to a health professional, appropriate monitoring and medication, and a healthy diet and lifestyle.   Your A1C is a measure of your sugar over the past 3 months and is not affected by what you have eaten over the past few days. Diabetes increases your chances of stroke and heart attack over 300 % and is the leading cause of blindness and kidney failure in the [place]. Please make sure you decrease bad carbs like white bread, white rice, potatoes, corn, soft drinks, pasta, cereals, refined sugars, sweet tea, dried fruits, and fruit juice. Good carbs are okay to eat in moderation like sweet potatoes, brown rice, whole grain pasta/bread, most fruit (except  dried fruit) and you can eat as many veggies as you want.   Greater than 6.5 is considered diabetic. Between 6.4 and 5.7 is prediabetic If your A1C is less than 5.7 you are NOT diabetic.  Targets for Glucose Readings: Time of Check Target for patients WITHOUT Diabetes Target for DIABETICS  Before Meals Less than 100  less than 150  Two hours after meals Less than 200  Less than 250    Can call Dr. Toy Cookey to get evaluated for dental sleep appliance. # [phone]  OR you can try Dr. Clearence Ped in [facility] # [phone]  We will send notes. Call and get price quote on both.   I think it is possible that you have sleep apnea. It can cause interrupted sleep, headaches, frequent awakenings, fatigue, dry mouth, fast/slow heart beats, memory issues, anxiety/depression, swelling, numbness tingling hands/feet, weight gain, shortness of breath, and the list goes on. Sleep apnea needs to be ruled out because if it is left untreated it does eventually lead to abnormal heart beats, lung failure or heart failure as well as increasing the risk of heart attack and stroke. There are masks you can wear OR a mouth piece that I can give you information about. Often times though people feel MUCH better after getting treatment.   Sleep Apnea  Sleep apnea is a sleep disorder characterized by abnormal pauses in breathing while you sleep. When your breathing pauses, the level of oxygen in your blood decreases. This causes you to move out of deep sleep and into light sleep. As a result, your quality of sleep is poor, and the system that carries your blood throughout your body (cardiovascular system) experiences stress. If sleep apnea remains untreated, the following conditions can develop:  High blood pressure (hypertension).  Coronary artery disease.  Inability to achieve or maintain an erection (impotence).  Impairment of your thought process (cognitive dysfunction). There are three types of sleep apnea: 1. Obstructive sleep apnea--Pauses in breathing during sleep because of a blocked airway. 2. Central sleep apnea--Pauses in breathing during sleep because the area of the brain that controls your breathing does not send the correct signals to the muscles that control breathing. 3. Mixed sleep apnea--A combination of both obstructive and central sleep apnea.  RISK FACTORS The following risk factors can increase your risk of developing sleep apnea:  Being  overweight.  Smoking.  Having narrow passages in your nose and throat.  Being of older age.  Being male.  Alcohol use.  Sedative and tranquilizer use.  Ethnicity. Among individuals younger than 35 years, African Americans are at increased risk of sleep apnea. SYMPTOMS   Difficulty staying asleep.  Daytime sleepiness and fatigue.  Loss of energy.  Irritability.  Loud, heavy snoring.  Morning headaches.  Trouble concentrating.  Forgetfulness.  Decreased interest in sex. DIAGNOSIS  In order to diagnose sleep apnea, your caregiver will perform a physical examination. Your caregiver may suggest that you take a home sleep test. Your caregiver may also recommend that you spend the night in a sleep lab. In the sleep lab, several monitors record information about your heart, lungs, and brain while you sleep. Your leg and arm movements and blood oxygen level are also recorded. TREATMENT The following actions may help to resolve mild sleep apnea:  Sleeping on your side.   Using a decongestant if you have nasal congestion.   Avoiding the use of depressants, including alcohol, sedatives, and narcotics.  Losing weight and modifying your diet if you are overweight. There also are devices and treatments to help open your airway:  Oral appliances. These are custom-made mouthpieces that shift your lower jaw forward and slightly open your bite. This opens your airway.  Devices that create positive airway pressure. This positive pressure "splints" your airway open to help you breathe better during sleep. The following devices create positive airway pressure:  Continuous positive airway pressure (CPAP) device. The CPAP device creates a continuous level of air pressure with an air pump. The air is delivered to your airway through a mask while you sleep. This continuous pressure keeps your airway open.  Nasal expiratory positive airway pressure (EPAP) device. The EPAP device  creates positive air pressure as you exhale. The device consists of single-use valves, which are inserted into each nostril and held in place by adhesive. The valves create very little resistance when you inhale but create much more resistance when you exhale. That increased resistance creates the positive airway pressure. This positive pressure while you exhale keeps your airway open, making it easier to breath when you inhale again.  Bilevel positive airway pressure (BPAP) device. The BPAP device is used mainly in patients with central sleep apnea. This device is similar to the CPAP device because it also uses an air pump to deliver continuous air pressure through a mask. However, with the BPAP machine, the pressure is set at two different levels. The pressure when you exhale is lower than the pressure when you inhale.  Surgery. Typically, surgery is only done if you cannot comply with less invasive treatments or if the less invasive treatments do not improve your condition. Surgery involves removing excess tissue in your airway to create a wider passage way. Document Released: 06/15/2002 Document Revised: 10/20/2012 Document Reviewed: 11/01/2011 [facility] Patient Information 2015 [place], [place]. This information is not intended to replace advice given to you by your health care provider. Make sure you discuss any questions you have with your health care provider.     Bad carbs also include fruit juice, alcohol, and sweet tea. These are empty calories that do not signal to your brain that you are full.   Please remember the good carbs are still carbs which convert into sugar. So please measure them out no more than 1/2-1 cup of rice, oatmeal, pasta, and beans  Veggies are however free foods! Pile them on.   Not all fruit is created equal. Please see the list below, the fruit at the bottom is higher in sugars than the fruit at the top. Please avoid all dried fruits.     Preventive Care for  Adults A healthy lifestyle and preventive care can promote health and wellness. Preventive health guidelines for men include the following key practices:  A routine yearly physical is a good way to check with your health care provider about your health and preventative screening. It is a chance to share any concerns and updates on your health and to receive a thorough exam.  Visit your dentist for a routine exam and preventative care every 6 months. Brush your teeth twice a day and floss once a day. Good oral hygiene prevents tooth decay and gum disease.  The frequency of eye exams is based on your age, health, family medical history, use of contact lenses, and other factors. Follow your health care provider's recommendations for frequency of eye exams.  Eat a healthy diet. Foods such as vegetables, fruits, whole grains, low-fat dairy products, and  lean protein foods contain the nutrients you need without too many calories. Decrease your intake of foods high in solid fats, added sugars, and salt. Eat the right amount of calories for you.Get information about a proper diet from your health care provider, if necessary.  Regular physical exercise is one of the most important things you can do for your health. Most adults should get at least 150 minutes of moderate-intensity exercise (any activity that increases your heart rate and causes you to sweat) each week. In addition, most adults need muscle-strengthening exercises on 2 or more days a week.  Maintain a healthy weight. The body mass index (BMI) is a screening tool to identify possible weight problems. It provides an estimate of body fat based on height and weight. Your health care provider can find your BMI and can help you achieve or maintain a healthy weight.For adults 20 years and older:  A BMI below 18.5 is considered underweight.  A BMI of 18.5 to 24.9 is normal.  A BMI of 25 to 29.9 is considered overweight.  A BMI of 30 and above is  considered obese.  Maintain normal blood lipids and cholesterol levels by exercising and minimizing your intake of saturated fat. Eat a balanced diet with plenty of fruit and vegetables. Blood tests for lipids and cholesterol should begin at age 54 and be repeated every 5 years. If your lipid or cholesterol levels are high, you are over 50, or you are at high risk for heart disease, you may need your cholesterol levels checked more frequently.Ongoing high lipid and cholesterol levels should be treated with medicines if diet and exercise are not working.  If you smoke, find out from your health care provider how to quit. If you do not use tobacco, do not start.  Lung cancer screening is recommended for adults aged 17-80 years who are at high risk for developing lung cancer because of a history of smoking. A yearly low-dose CT scan of the lungs is recommended for people who have at least a 30-pack-year history of smoking and are a current smoker or have quit within the past 15 years. A pack year of smoking is smoking an average of 1 pack of cigarettes a day for 1 year (for example: 1 pack a day for 30 years or 2 packs a day for 15 years). Yearly screening should continue until the smoker has stopped smoking for at least 15 years. Yearly screening should be stopped for people who develop a health problem that would prevent them from having lung cancer treatment.  If you choose to drink alcohol, do not have more than 2 drinks per day. One drink is considered to be 12 ounces (355 mL) of beer, 5 ounces (148 mL) of wine, or 1.5 ounces (44 mL) of liquor.  Avoid use of street drugs. Do not share needles with anyone. Ask for help if you need support or instructions about stopping the use of drugs.  High blood pressure causes heart disease and increases the risk of stroke. Your blood pressure should be checked at least every 1-2 years. Ongoing high blood pressure should be treated with medicines, if weight loss  and exercise are not effective.  If you are 56-38 years old, ask your health care provider if you should take aspirin to prevent heart disease.  Diabetes screening involves taking a blood sample to check your fasting blood sugar level. Testing should be considered at a younger age or be carried out more frequently if  you are overweight and have at least 1 risk factor for diabetes.  Colorectal cancer can be detected and often prevented. Most routine colorectal cancer screening begins at the age of 15 and continues through age 58. However, your health care provider may recommend screening at an earlier age if you have risk factors for colon cancer. On a yearly basis, your health care provider may provide home test kits to check for hidden blood in the stool. Use of a small camera at the end of a tube to directly examine the colon (sigmoidoscopy or colonoscopy) can detect the earliest forms of colorectal cancer. Talk to your health care provider about this at age 22, when routine screening begins. Direct exam of the colon should be repeated every 5-10 years through age 92, unless early forms of precancerous polyps or small growths are found.  Hepatitis C blood testing is recommended for all people born from [birth year] through 1966 and any individual with known risks for hepatitis C.  New guidelines recommend a once time screening for HIV.   Screening for abdominal aortic aneurysm (AAA)  by ultrasound is recommended for people who have history of high blood pressure or who are current or former smokers.  Healthy men should  receive prostate-specific antigen (PSA) blood tests as part of routine cancer screening. Talk with your health care provider about prostate cancer screening.  Testicular cancer screening is  recommended for adult males. Screening includes self-exam, a health care provider exam, and other screening tests. Consult with your health care provider about any symptoms you have or any concerns you  have about testicular cancer.  Use sunscreen. Apply sunscreen liberally and repeatedly throughout the day. You should seek shade when your shadow is shorter than you. Protect yourself by wearing long sleeves, pants, a wide-brimmed hat, and sunglasses year round, whenever you are outdoors.  Once a month, do a whole-body skin exam, using a mirror to look at the skin on your back. Tell your health care provider about new moles, moles that have irregular borders, moles that are larger than a pencil eraser, or moles that have changed in shape or color.  Stay current with required vaccines (immunizations).  Influenza vaccine. All adults should be immunized every year.  Tetanus, diphtheria, and acellular pertussis (Td, Tdap) vaccine. An adult who has not previously received Tdap or who does not know his vaccine status should receive 1 dose of Tdap. This initial dose should be followed by tetanus and diphtheria toxoids (Td) booster doses every 10 years. Adults with an unknown or incomplete history of completing a 3-dose immunization series with Td-containing vaccines should begin or complete a primary immunization series including a Tdap dose. Adults should receive a Td booster every 10 years.  Zoster vaccine. One dose is recommended for adults aged 42 years or older unless certain conditions are present.    PREVNAR - Pneumococcal 13-valent conjugate (PCV13) vaccine. When indicated, a person who is uncertain of his immunization history and has no record of immunization should receive the PCV13 vaccine. An adult aged 105 years or older who has certain medical conditions and has not been previously immunized should receive 1 dose of PCV13 vaccine. This PCV13 should be followed with a dose of pneumococcal polysaccharide (PPSV23) vaccine. The PPSV23 vaccine dose should be obtained at least 8 weeks after the dose of PCV13 vaccine. An adult aged 74 years or older who has certain medical conditions and previously  received 1 or more doses of PPSV23 vaccine should receive  1 dose of PCV13. The PCV13 vaccine dose should be obtained 1 or more years after the last PPSV23 vaccine dose.    PNEUMOVAX - Pneumococcal polysaccharide (PPSV23) vaccine. When PCV13 is also indicated, PCV13 should be obtained first. All adults aged 37 years and older should be immunized. An adult younger than age 57 years who has certain medical conditions should be immunized. Any person who resides in a nursing home or long-term care facility should be immunized. An adult smoker should be immunized. People with an immunocompromised condition and certain other conditions should receive both PCV13 and PPSV23 vaccines. People with human immunodeficiency virus (HIV) infection should be immunized as soon as possible after diagnosis. Immunization during chemotherapy or radiation therapy should be avoided. Routine use of PPSV23 vaccine is not recommended for American Indians, [place] Natives, or people younger than 65 years unless there are medical conditions that require PPSV23 vaccine. When indicated, people who have unknown immunization and have no record of immunization should receive PPSV23 vaccine. One-time revaccination 5 years after the first dose of PPSV23 is recommended for people aged 19-64 years who have chronic kidney failure, nephrotic syndrome, asplenia, or immunocompromised conditions. People who received 1-2 doses of PPSV23 before age 33 years should receive another dose of PPSV23 vaccine at age 63 years or later if at least 5 years have passed since the previous dose. Doses of PPSV23 are not needed for people immunized with PPSV23 at or after age 38 years.    Hepatitis A vaccine. Adults who wish to be protected from this disease, have certain high-risk conditions, work with hepatitis A-infected animals, work in hepatitis A research labs, or travel to or work in countries with a high rate of hepatitis A should be immunized. Adults who were  previously unvaccinated and who anticipate close contact with an international adoptee during the first 60 days after arrival in the [place] from a country with a high rate of hepatitis A should be immunized.    Hepatitis B vaccine. Adults should be immunized if they wish to be protected from this disease, have certain high-risk conditions, may be exposed to blood or other infectious body fluids, are household contacts or sex partners of hepatitis B positive people, are clients or workers in certain care facilities, or travel to or work in countries with a high rate of hepatitis B.   Preventive Service / Frequency   Ages 54 and over  Blood pressure check.  Lipid and cholesterol check.  Lung cancer screening. / Every year if you are aged 16-80 years and have a 30-pack-year history of smoking and currently smoke or have quit within the past 15 years. Yearly screening is stopped once you have quit smoking for at least 15 years or develop a health problem that would prevent you from having lung cancer treatment.  Fecal occult blood test (FOBT) of stool. You may not have to do this test if you get a colonoscopy every 10 years.  Flexible sigmoidoscopy** or colonoscopy.** / Every 5 years for a flexible sigmoidoscopy or every 10 years for a colonoscopy beginning at age 48 and continuing until age 90.  Hepatitis C blood test.** / For all people born from [birth year] through 1966 and any individual with known risks for hepatitis C.  Abdominal aortic aneurysm (AAA) screening./ Screening current or former smokers or have Hypertension.  Skin self-exam. / Monthly.  Influenza vaccine. / Every year.  Tetanus, diphtheria, and acellular pertussis (Tdap/Td) vaccine.** / 1 dose of Td every 10  years.   Zoster vaccine.** / 1 dose for adults aged 28 years or older.         Pneumococcal 13-valent conjugate (PCV13) vaccine.    Pneumococcal polysaccharide (PPSV23) vaccine.     Hepatitis A vaccine.** /  Consult your health care provider.  Hepatitis B vaccine.** / Consult your health care provider. Screening for abdominal aortic aneurysm (AAA)  by ultrasound is recommended for people who have history of high blood pressure or who are current or former smokers.  9 Ways to Naturally Increase Testosterone Levels  1.   Lose Weight If you're overweight, shedding the excess pounds may increase your testosterone levels, according to research presented at the Endocrine Society's 2012 meeting. Overweight men are more likely to have low testosterone levels to begin with, so this is an important trick to increase your body's testosterone production when you need it most.  2.   High-Intensity Exercise like [employer]  Short intense exercise has a proven positive effect on increasing testosterone levels and preventing its decline. That's unlike aerobics or prolonged moderate exercise, which have shown to have negative or no effect on testosterone levels. Having a whey protein meal after exercise can further enhance the satiety/testosterone-boosting impact (hunger hormones cause the opposite effect on your testosterone and libido). Here's a summary of what a typical high-intensity [employer] routine might look like: " Warm up for three minutes  " Exercise as hard and fast as you can for 30 seconds. You should feel like you couldn't possibly go on another few seconds  " Recover at a slow to moderate pace for 90 seconds  " Repeat the high intensity exercise and recovery 7 more times .  3.   Consume Plenty of Zinc The mineral zinc is important for testosterone production, and supplementing your diet for as little as six weeks has been shown to cause a marked improvement in testosterone among men with low levels.1 Likewise, research has shown that restricting dietary sources of zinc leads to a significant decrease in testosterone, while zinc supplementation increases it2 -- and even protects men from  exercised-induced reductions in testosterone levels.3 It's estimated that up to 3 percent of adults over the age of 60 may have lower than recommended zinc intakes; even when dietary supplements were added in, an estimated 20-25 percent of older adults still had inadequate zinc intakes, according to a [employer] and Nutrition Examination Survey.4 Your diet is the best source of zinc; along with protein-rich foods like meats and fish, other good dietary sources of zinc include raw milk, raw cheese, beans, and yogurt or kefir made from raw milk. It can be difficult to obtain enough dietary zinc if you're a vegetarian, and also for meat-eaters as well, largely because of conventional farming methods that rely heavily on chemical fertilizers and pesticides. These chemicals deplete the soil of nutrients ... nutrients like zinc that must be absorbed by plants in order to be passed on to you. In many cases, you may further deplete the nutrients in your food by the way you prepare it. For most food, cooking it will drastically reduce its levels of nutrients like zinc . particularly over-cooking, which many people do. If you decide to use a zinc supplement, stick to a dosage of less than 40 mg a day, as this is the recommended adult upper limit. Taking too much zinc can interfere with your body's ability to absorb other minerals, especially copper, and may cause nausea as a side effect.  4.   Strength Training In addition to [employer], strength training is also known to boost testosterone levels, provided you are doing so intensely enough. When strength training to boost testosterone, you'll want to increase the weight and lower your number of reps, and then focus on exercises that work a large number of muscles, such as dead lifts or squats.  You can "turbo-charge" your weight training by going slower. By slowing down your movement, you're actually turning it into a high-intensity exercise. Super Slow  movement allows your muscle, at the microscopic level, to access the maximum number of cross-bridges between the protein filaments that produce movement in the muscle.   5.   Optimize Your Vitamin D Levels Vitamin D, a steroid hormone, is essential for the healthy development of the nucleus of the sperm cell, and helps maintain semen quality and sperm count. Vitamin D also increases levels of testosterone, which may boost libido. In one study, overweight men who were given vitamin D supplements had a significant increase in testosterone levels after one year.5   6.   Reduce Stress When you're under a lot of stress, your body releases high levels of the stress hormone cortisol. This hormone actually blocks the effects of testosterone,6 presumably because, from a biological standpoint, testosterone-associated behaviors (mating, competing, aggression) may have lowered your chances of survival in an emergency (hence, the "fight or flight" response is dominant, courtesy of cortisol).  7.   Limit or Eliminate Sugar from Your Diet Testosterone levels decrease after you eat sugar, which is likely because the sugar leads to a high insulin level, another factor leading to low testosterone.7 Based on USDA estimates, the average American consumes 12 teaspoons of sugar a day, which equates to about TWO TONS of sugar during a lifetime.  8.   Eat Healthy Fats By healthy, this means not only mon- and polyunsaturated fats, like that found in avocadoes and nuts, but also saturated, as these are essential for building testosterone. Research shows that a diet with less than 40 percent of energy as fat (and that mainly from animal sources, i.e. saturated) lead to a decrease in testosterone levels.8 My personal diet is about 60-70 percent healthy fat, and other experts agree that the ideal diet includes somewhere between 50-70 percent fat.  It's important to understand that your body requires saturated fats from animal and  vegetable sources (such as meat, dairy, certain oils, and tropical plants like coconut) for optimal functioning, and if you neglect this important food group in favor of sugar, grains and other starchy carbs, your health and weight are almost guaranteed to suffer. Examples of healthy fats you can eat more of to give your testosterone levels a boost include: Olives and Olive oil  Coconuts and coconut oil Butter made from raw grass-fed organic milk Raw nuts, such as, almonds or pecans Organic pastured egg yolks Avocados Grass-fed meats Palm oil Unheated organic nut oils   9.   Boost Your Intake of Branch Chain Amino Acids (BCAA) from Foods Like [place] suggests that BCAAs result in higher testosterone levels, particularly when taken along with resistance training.9 While BCAAs are available in supplement form, you'll find the highest concentrations of BCAAs like leucine in dairy products - especially quality cheeses and whey protein. Even when getting leucine from your natural food supply, it's often wasted or used as a building block instead of an anabolic agent. So to create the correct anabolic environment, you need to boost leucine consumption way  beyond mere maintenance levels. That said, keep in mind that using leucine as a free form amino acid can be highly counterproductive as when free form amino acids are artificially administrated, they rapidly enter your circulation while disrupting insulin function, and impairing your body's glycemic control. Food-based leucine is really the ideal form that can benefit your muscles without side effects.

## 2015-03-25 ENCOUNTER — Encounter: Payer: Self-pay | Admitting: Physician Assistant

## 2015-03-25 LAB — URINALYSIS, MICROSCOPIC ONLY
BACTERIA UA: NONE SEEN [HPF]
Casts: NONE SEEN [LPF]
Crystals: NONE SEEN [HPF]
RBC / HPF: NONE SEEN RBC/HPF (ref <=2)
SQUAMOUS EPITHELIAL / LPF: NONE SEEN [HPF] (ref <=5)
WBC UA: NONE SEEN WBC/HPF (ref <=5)
YEAST: NONE SEEN [HPF]

## 2015-03-25 LAB — LIPID PANEL
CHOLESTEROL: 235 mg/dL — AB (ref 125–200)
HDL: 32 mg/dL — AB (ref >=40)
TRIGLYCERIDES: 459 mg/dL — AB (ref <150)
Total CHOL/HDL Ratio: 7.3 Ratio — ABNORMAL HIGH (ref <=5.0)

## 2015-03-25 LAB — HEMOGLOBIN A1C
Hgb A1c MFr Bld: 7.4 % — ABNORMAL HIGH (ref <5.7)
Mean Plasma Glucose: 166 mg/dL — ABNORMAL HIGH (ref <117)

## 2015-03-25 LAB — BASIC METABOLIC PANEL WITH GFR
BUN: 14 mg/dL (ref 7–25)
CO2: 23 mmol/L (ref 20–31)
Calcium: 9.4 mg/dL (ref 8.6–10.3)
Chloride: 102 mmol/L (ref 98–110)
Creat: 0.92 mg/dL (ref 0.70–1.25)
GFR, Est African American: >89 mL/min (ref >=60)
GFR, Est Non African American: >89 mL/min (ref >=60)
Glucose, Bld: 184 mg/dL — ABNORMAL HIGH (ref 65–99)
POTASSIUM: 4.1 mmol/L (ref 3.5–5.3)
SODIUM: 139 mmol/L (ref 135–146)

## 2015-03-25 LAB — URINALYSIS, ROUTINE W REFLEX MICROSCOPIC
Bilirubin Urine: NEGATIVE
HGB URINE DIPSTICK: NEGATIVE
Ketones, ur: NEGATIVE
LEUKOCYTES UA: NEGATIVE
NITRITE: NEGATIVE
PROTEIN: NEGATIVE
Specific Gravity, Urine: 1.037 — ABNORMAL HIGH (ref 1.001–1.035)
pH: 5.5 (ref 5.0–8.0)

## 2015-03-25 LAB — HEPATIC FUNCTION PANEL
ALK PHOS: 57 U/L (ref 40–115)
ALT: 20 U/L (ref 9–46)
AST: 15 U/L (ref 10–35)
Albumin: 4.3 g/dL (ref 3.6–5.1)
Bilirubin, Direct: 0.1 mg/dL (ref <=0.2)
Indirect Bilirubin: 0.8 mg/dL (ref 0.2–1.2)
Total Bilirubin: 0.9 mg/dL (ref 0.2–1.2)
Total Protein: 6.8 g/dL (ref 6.1–8.1)

## 2015-03-25 LAB — MICROALBUMIN / CREATININE URINE RATIO
CREATININE, URINE: 68.5 mg/dL
MICROALB/CREAT RATIO: 4.4 mg/g (ref 0.0–30.0)
Microalb, Ur: 0.3 mg/dL (ref <2.0)

## 2015-03-25 LAB — INSULIN, FASTING: Insulin fasting, serum: 42.9 u[IU]/mL — ABNORMAL HIGH (ref 2.0–19.6)

## 2015-03-25 LAB — MAGNESIUM: MAGNESIUM: 1.9 mg/dL (ref 1.5–2.5)

## 2015-03-25 LAB — TESTOSTERONE: Testosterone: 165 ng/dL — ABNORMAL LOW (ref 300–890)

## 2015-03-25 LAB — TSH: TSH: 1.478 u[IU]/mL (ref 0.350–4.500)

## 2015-03-25 LAB — IRON AND TIBC
%SAT: 19 % (ref 15–60)
Iron: 64 ug/dL (ref 50–180)
TIBC: 344 ug/dL (ref 250–425)
UIBC: 280 ug/dL (ref 125–400)

## 2015-03-25 LAB — FERRITIN: Ferritin: 101 ng/mL (ref 22–322)

## 2015-03-25 LAB — VITAMIN D 25 HYDROXY (VIT D DEFICIENCY, FRACTURES): VIT D 25 HYDROXY: 23 ng/mL — AB (ref 30–100)

## 2015-03-25 LAB — URIC ACID: URIC ACID, SERUM: 5.5 mg/dL (ref 4.0–7.8)

## 2015-03-25 LAB — PSA: PSA: 0.13 ng/mL (ref <=4.00)

## 2015-03-25 LAB — VITAMIN B12: VITAMIN B 12: 314 pg/mL (ref 211–911)

## 2015-04-14 ENCOUNTER — Encounter: Payer: Self-pay | Admitting: Physician Assistant

## 2015-05-14 ENCOUNTER — Other Ambulatory Visit: Payer: Self-pay | Admitting: Internal Medicine

## 2015-07-07 ENCOUNTER — Other Ambulatory Visit: Payer: Self-pay | Admitting: Physician Assistant

## 2015-08-11 DIAGNOSIS — C44219 Basal cell carcinoma of skin of left ear and external auricular canal: Secondary | ICD-10-CM | POA: Diagnosis not present

## 2015-09-15 ENCOUNTER — Encounter: Payer: Self-pay | Admitting: Physician Assistant

## 2015-09-15 ENCOUNTER — Ambulatory Visit (INDEPENDENT_AMBULATORY_CARE_PROVIDER_SITE_OTHER): Payer: 59 | Admitting: Physician Assistant

## 2015-09-15 VITALS — BP 130/76 | HR 86 | Temp 97.7°F | Resp 16 | Ht 72.0 in | Wt 242.0 lb

## 2015-09-15 DIAGNOSIS — Z85828 Personal history of other malignant neoplasm of skin: Secondary | ICD-10-CM | POA: Diagnosis not present

## 2015-09-15 DIAGNOSIS — Z79899 Other long term (current) drug therapy: Secondary | ICD-10-CM

## 2015-09-15 DIAGNOSIS — E669 Obesity, unspecified: Secondary | ICD-10-CM

## 2015-09-15 DIAGNOSIS — I10 Essential (primary) hypertension: Secondary | ICD-10-CM | POA: Diagnosis not present

## 2015-09-15 DIAGNOSIS — E119 Type 2 diabetes mellitus without complications: Secondary | ICD-10-CM

## 2015-09-15 DIAGNOSIS — E785 Hyperlipidemia, unspecified: Secondary | ICD-10-CM | POA: Diagnosis not present

## 2015-09-15 DIAGNOSIS — E559 Vitamin D deficiency, unspecified: Secondary | ICD-10-CM | POA: Diagnosis not present

## 2015-09-15 LAB — HEPATIC FUNCTION PANEL
ALK PHOS: 51 U/L (ref 40–115)
ALT: 32 U/L (ref 9–46)
AST: 22 U/L (ref 10–35)
Albumin: 4.1 g/dL (ref 3.6–5.1)
BILIRUBIN DIRECT: 0.1 mg/dL (ref <=0.2)
BILIRUBIN INDIRECT: 0.7 mg/dL (ref 0.2–1.2)
BILIRUBIN TOTAL: 0.8 mg/dL (ref 0.2–1.2)
Total Protein: 6.7 g/dL (ref 6.1–8.1)

## 2015-09-15 LAB — CBC WITH DIFFERENTIAL/PLATELET
BASOS ABS: 0 10*3/uL (ref 0.0–0.1)
BASOS PCT: 0 % (ref 0–1)
Eosinophils Absolute: 0.2 10*3/uL (ref 0.0–0.7)
Eosinophils Relative: 3 % (ref 0–5)
HEMATOCRIT: 44 % (ref 39.0–52.0)
HEMOGLOBIN: 14.9 g/dL (ref 13.0–17.0)
LYMPHS PCT: 41 % (ref 12–46)
Lymphs Abs: 2.4 10*3/uL (ref 0.7–4.0)
MCH: 28.3 pg (ref 26.0–34.0)
MCHC: 33.9 g/dL (ref 30.0–36.0)
MCV: 83.5 fL (ref 78.0–100.0)
MONO ABS: 0.3 10*3/uL (ref 0.1–1.0)
MPV: 9.7 fL (ref 8.6–12.4)
Monocytes Relative: 5 % (ref 3–12)
NEUTROS ABS: 3 10*3/uL (ref 1.7–7.7)
NEUTROS PCT: 51 % (ref 43–77)
Platelets: 190 10*3/uL (ref 150–400)
RBC: 5.27 MIL/uL (ref 4.22–5.81)
RDW: 13.4 % (ref 11.5–15.5)
WBC: 5.9 10*3/uL (ref 4.0–10.5)

## 2015-09-15 LAB — LIPID PANEL
CHOL/HDL RATIO: 7 ratio — AB (ref <=5.0)
Cholesterol: 230 mg/dL — ABNORMAL HIGH (ref 125–200)
HDL: 33 mg/dL — AB (ref >=40)
LDL CALC: 123 mg/dL (ref <130)
Triglycerides: 371 mg/dL — ABNORMAL HIGH (ref <150)
VLDL: 74 mg/dL — ABNORMAL HIGH (ref <30)

## 2015-09-15 LAB — MAGNESIUM: MAGNESIUM: 1.8 mg/dL (ref 1.5–2.5)

## 2015-09-15 LAB — BASIC METABOLIC PANEL WITH GFR
BUN: 18 mg/dL (ref 7–25)
CHLORIDE: 101 mmol/L (ref 98–110)
CO2: 27 mmol/L (ref 20–31)
Calcium: 9.5 mg/dL (ref 8.6–10.3)
Creat: 1.31 mg/dL — ABNORMAL HIGH (ref 0.70–1.25)
GFR, EST NON AFRICAN AMERICAN: 59 mL/min — AB (ref >=60)
GFR, Est African American: 68 mL/min (ref >=60)
Glucose, Bld: 204 mg/dL — ABNORMAL HIGH (ref 65–99)
POTASSIUM: 4 mmol/L (ref 3.5–5.3)
SODIUM: 137 mmol/L (ref 135–146)

## 2015-09-15 LAB — TSH: TSH: 1.54 mIU/L (ref 0.40–4.50)

## 2015-09-15 MED ORDER — CANAGLIFLOZIN-METFORMIN HCL ER 150-1000 MG PO TB24: 300.0000 mg | ORAL_TABLET | Freq: Every morning | ORAL | Status: DC

## 2015-09-15 NOTE — Patient Instructions (Addendum)
This is elevated enough to cause acute pancreatitis which can put you in the hospital and kill you. VERY VERY important to be strict with diet.      Bad carbs also include fruit juice, alcohol, and sweet tea. These are empty calories that do not signal to your brain that you are full.   Please remember the good carbs are still carbs which convert into sugar. So please measure them out no more than 1/2-1 cup of rice, oatmeal, pasta, and beans  Veggies are however free foods! Pile them on.   Not all fruit is created equal. Please see the list below, the fruit at the bottom is higher in sugars than the fruit at the top. Please avoid all dried fruits.    We want weight loss that will last so you should lose 1-2 pounds a week.  THAT IS IT! Please pick THREE things a month to change. Once it is a habit check off the item. Then pick another three items off the list to become habits.  If you are already doing a habit on the list GREAT!  Cross that item off! o Don't drink your calories. Ie, alcohol, soda, fruit juice, and sweet tea.  o Drink more water. Drink a glass when you feel hungry or before each meal.  o Eat breakfast - Complex carb and protein (likeDannon light and fit yogurt, oatmeal, fruit, eggs, [place] bacon). o Measure your cereal.  Eat no more than one cup a day. (ie [place]) o Eat an apple a day. o Add a vegetable a day. o Try a new vegetable a month. o Use Pam! Stop using oil or butter to cook. o Don't finish your plate or use smaller plates. o Share your dessert. o Eat sugar free Jello for dessert or frozen grapes. o Don't eat 2-3 hours before bed. o Switch to whole wheat bread, pasta, and brown rice. o Make healthier choices when you eat out. No fries! o Pick baked chicken, NOT fried. o Don't forget to SLOW DOWN when you eat. It is not going anywhere.  o Take the stairs. o Park far away in the parking lot o [employer] (or weights) for 10 minutes while watching TV. o Walk at  work for 10 minutes during break. o Walk outside 1 time a week with your friend, kids, dog, or significant other. o Start a walking group at [employer] the mall as much as you can tolerate.  o Keep a food diary. o Weigh yourself daily. o Walk for 15 minutes 3 days per week. o Cook at home more often and eat out less.  If life happens and you go back to old habits, it is okay.  Just start over. You can do it!   If you experience chest pain, get short of breath, or tired during the exercise, please stop immediately and inform your doctor.    Vitamin D goal is between 60-80  Please make sure that you are taking your Vitamin D as directed.   It is very important as a natural anti-inflammatory   helping hair, skin, and nails, as well as reducing stroke and heart attack risk.   It helps your bones and helps with mood.  It also decreases numerous cancer risks so please take it as directed.   Low Vit D is associated with a 200-300% higher risk for CANCER   and 200-300% higher risk for HEART   ATTACK  &  STROKE.    .....................................Marland Kitchen  It is also associated with higher death rate at younger ages,   autoimmune diseases like Rheumatoid arthritis, Lupus, Multiple Sclerosis.     Also many other serious conditions, like depression, Alzheimer's  Dementia, infertility, muscle aches, fatigue, fibromyalgia - just to name a few.  +++++++++++++++++++

## 2015-09-15 NOTE — Progress Notes (Signed)
Assessment and Plan:  Hypertension: Continue medication, monitor blood pressure at home. Continue DASH diet. Cholesterol: Continue diet and exercise. Check cholesterol.  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 Add invokana with metformin Vitamin D Def- check level and continue medications.  Obesity with co morbidities- long discussion about weight loss, diet, and exercise OSA NOT on CPAP, discussed need to get on it, will think about it.   Follow up 3 months Continue diet and meds as discussed.  Future Appointments Date Time Provider [place]  03/27/2016 2:00 PM Vicie Mutters, PA-C [id number] None     HPI 60 y.o. male  presents for 3 month follow up with hypertension, hyperlipidemia, prediabetes and vitamin D. His blood pressure is normally controlled at home,  today their BP is BP: 130/76 mmHg He does workout. He denies chest pain, shortness of breath, dizziness.  He is not on cholesterol medication, he can not tolerate statins and denies myalgias. His cholesterol is not at goal. The cholesterol last visit was:   Lab Results  Component Value Date   CHOL 235* 03/24/2015   HDL 32* 03/24/2015   LDLCALC NOT CALC 03/24/2015   TRIG 459* 03/24/2015   CHOLHDL 7.3* 03/24/2015   He has been working on diet and exercise for diabetes with CKD, he is on bASA, he is on ACE, and denies paresthesia of the feet, polydipsia, polyuria and visual disturbances. Last A1C in the office was:  Lab Results  Component Value Date   HGBA1C 7.4* 03/24/2015   Patient is on Vitamin D supplement.   Lab Results  Component Value Date   VD25OH 23* 03/24/2015     OSA not on CPAP.  BMI is Body mass index is 32.81 kg/(m^2)., he is working on diet and exercise and has done well.  Wt Readings from Last 3 Encounters:  09/15/15 242 lb (109.77 kg)  03/24/15 243 lb (110.224 kg)  04/21/14 249 lb (112.946 kg)   Current Medications:  Current Outpatient Prescriptions on File Prior to Visit   Medication Sig Dispense Refill  . aspirin 81 MG tablet Take 81 mg by mouth daily.    . citalopram (CELEXA) 20 MG tablet TAKE 1 TABLET BY MOUTH EVERY DAY 90 tablet 1  . INVOKANA 300 MG TABS tablet TAKE 1 TABLET BY MOUTH DAILY BEFORE BREAKFAST 90 tablet 0  . lisinopril (PRINIVIL,ZESTRIL) 10 MG tablet Take 1 tablet (10 mg total) by mouth daily. 90 tablet 1  . omeprazole (PRILOSEC) 20 MG capsule Take 1 capsule (20 mg total) by mouth daily. 90 capsule 3   No current facility-administered medications on file prior to visit.   Medical History:  Past Medical History  Diagnosis Date  . Diabetes mellitus without complication ([place])   . Cancer ([place])   . Hypertension   . Hyperlipidemia   . GERD (gastroesophageal reflux disease)   . Unspecified vitamin D deficiency   . OSA on CPAP 08/03/2013   Allergies:  Allergies  Allergen Reactions  . Caduet [Amlodipine-Atorvastatin]   . Statins     BLURRED VISION    Review of Systems  Constitutional: Negative.   HENT: Negative for congestion, ear discharge, ear pain, hearing loss, nosebleeds, sore throat and tinnitus.   Eyes: Negative.   Respiratory: Negative.  Negative for cough, shortness of breath and stridor.   Cardiovascular: Positive for leg swelling. Negative for chest pain, palpitations, orthopnea, claudication and PND.  Gastrointestinal: Negative.   Genitourinary: Negative.  Negative for urgency and frequency.  Musculoskeletal: Positive for  back pain and joint pain. Negative for myalgias, falls and neck pain.  Skin: Negative.   Neurological: Negative.  Negative for dizziness and headaches.  Psychiatric/Behavioral: Negative for depression, suicidal ideas, hallucinations, memory loss and substance abuse. The patient has insomnia. The patient is not nervous/anxious.      Family history- Review and unchanged Social history- Review and unchanged Physical Exam: BP 130/76 mmHg  Pulse 86  Temp(Src) 97.7 F (36.5 C) (Temporal)  Resp 16  Ht 6'  (1.829 m)  Wt 242 lb (109.77 kg)  BMI 32.81 kg/m2  SpO2 97% Wt Readings from Last 3 Encounters:  09/15/15 242 lb (109.77 kg)  03/24/15 243 lb (110.224 kg)  04/21/14 249 lb (112.946 kg)   General Appearance: Well nourished, in no apparent distress. Eyes: PERRLA, EOMs, conjunctiva no swelling or erythema Sinuses: No Frontal/maxillary tenderness ENT/Mouth: Ext aud canals clear, TMs without erythema, bulging. No erythema, swelling, or exudate on post pharynx.  Tonsils not swollen or erythematous. Hearing normal.  Neck: Supple, thyroid normal.  Respiratory: Respiratory effort normal, BS equal bilaterally without rales, rhonchi, wheezing or stridor.  Cardio: RRR with no MRGs. Brisk peripheral pulses without edema.  Abdomen: Soft, + BS.  Non tender, no guarding, rebound, hernias, masses. Lymphatics: Non tender without lymphadenopathy.  Musculoskeletal: Full ROM, 5/5 strength, normal gait.  Skin: Warm, dry without rashes, lesions, ecchymosis.  Neuro: Cranial nerves intact. Normal muscle tone, no cerebellar symptoms. Sensation intact.  Psych: Awake and oriented X 3, normal affect, Insight and Judgment appropriate.    Vicie Mutters 11:26 AM

## 2015-09-16 LAB — HEMOGLOBIN A1C
HEMOGLOBIN A1C: 8 % — AB (ref <5.7)
Mean Plasma Glucose: 183 mg/dL — ABNORMAL HIGH (ref <117)

## 2015-09-16 LAB — VITAMIN D 25 HYDROXY (VIT D DEFICIENCY, FRACTURES): Vit D, 25-Hydroxy: 20 ng/mL — ABNORMAL LOW (ref 30–100)

## 2015-09-16 MED ORDER — FENOFIBRATE 145 MG PO TABS: 145.0000 mg | ORAL_TABLET | Freq: Every day | ORAL | Status: DC

## 2015-09-16 NOTE — Addendum Note (Signed)
Addended by: Vicie Mutters R on: 09/16/2015 07:22 AM   Modules accepted: Orders

## 2015-09-23 MED FILL — INVOKAMET XR 150-1,000 MG T: 150-1000 | 30 days supply | Qty: 60 | Fill #0

## 2015-10-17 ENCOUNTER — Other Ambulatory Visit: Payer: Self-pay

## 2015-10-17 MED ORDER — LISINOPRIL 10 MG PO TABS: 10.0000 mg | ORAL_TABLET | Freq: Every day | ORAL | Status: DC

## 2015-10-17 MED FILL — OMEPRAZOLE DR 20 MG CAPSULE: 20 | 90 days supply | Qty: 90 | Fill #0

## 2015-10-17 MED FILL — LISINOPRIL 10 MG TABLET: 10 | 90 days supply | Qty: 90 | Fill #0

## 2015-10-19 ENCOUNTER — Other Ambulatory Visit: Payer: 59

## 2015-10-19 ENCOUNTER — Other Ambulatory Visit: Payer: Self-pay

## 2015-10-19 DIAGNOSIS — I10 Essential (primary) hypertension: Secondary | ICD-10-CM | POA: Diagnosis not present

## 2015-10-19 DIAGNOSIS — E119 Type 2 diabetes mellitus without complications: Secondary | ICD-10-CM | POA: Diagnosis not present

## 2015-10-19 LAB — BASIC METABOLIC PANEL WITH GFR
BUN: 15 mg/dL (ref 7–25)
CHLORIDE: 102 mmol/L (ref 98–110)
CO2: 26 mmol/L (ref 20–31)
CREATININE: 0.85 mg/dL (ref 0.70–1.25)
Calcium: 9.7 mg/dL (ref 8.6–10.3)
GFR, Est African American: >89 mL/min (ref >=60)
Glucose, Bld: 124 mg/dL — ABNORMAL HIGH (ref 65–99)
Potassium: 4.3 mmol/L (ref 3.5–5.3)
SODIUM: 138 mmol/L (ref 135–146)

## 2015-10-20 ENCOUNTER — Other Ambulatory Visit: Payer: Self-pay

## 2015-10-24 MED FILL — INVOKAMET XR 150-1,000 MG T: 150-1000 | 90 days supply | Qty: 180 | Fill #1

## 2015-11-16 MED FILL — CITALOPRAM HBR 20 MG TABLET: 20 | 90 days supply | Qty: 90 | Fill #0

## 2015-12-13 DIAGNOSIS — Z85828 Personal history of other malignant neoplasm of skin: Secondary | ICD-10-CM | POA: Diagnosis not present

## 2015-12-13 DIAGNOSIS — D044 Carcinoma in situ of skin of scalp and neck: Secondary | ICD-10-CM | POA: Diagnosis not present

## 2015-12-13 DIAGNOSIS — D485 Neoplasm of uncertain behavior of skin: Secondary | ICD-10-CM | POA: Diagnosis not present

## 2015-12-13 DIAGNOSIS — D225 Melanocytic nevi of trunk: Secondary | ICD-10-CM | POA: Diagnosis not present

## 2015-12-13 DIAGNOSIS — D1801 Hemangioma of skin and subcutaneous tissue: Secondary | ICD-10-CM | POA: Diagnosis not present

## 2015-12-13 DIAGNOSIS — L821 Other seborrheic keratosis: Secondary | ICD-10-CM | POA: Diagnosis not present

## 2015-12-13 DIAGNOSIS — L820 Inflamed seborrheic keratosis: Secondary | ICD-10-CM | POA: Diagnosis not present

## 2015-12-13 DIAGNOSIS — L570 Actinic keratosis: Secondary | ICD-10-CM | POA: Diagnosis not present

## 2015-12-21 ENCOUNTER — Ambulatory Visit (INDEPENDENT_AMBULATORY_CARE_PROVIDER_SITE_OTHER): Payer: 59 | Admitting: Physician Assistant

## 2015-12-21 ENCOUNTER — Encounter: Payer: Self-pay | Admitting: Physician Assistant

## 2015-12-21 VITALS — BP 140/90 | HR 80 | Temp 98.1°F | Resp 16 | Ht 72.0 in | Wt 243.0 lb

## 2015-12-21 DIAGNOSIS — E119 Type 2 diabetes mellitus without complications: Secondary | ICD-10-CM | POA: Diagnosis not present

## 2015-12-21 DIAGNOSIS — E3121 Multiple endocrine neoplasia [MEN] type I: Secondary | ICD-10-CM

## 2015-12-21 DIAGNOSIS — E559 Vitamin D deficiency, unspecified: Secondary | ICD-10-CM

## 2015-12-21 DIAGNOSIS — E785 Hyperlipidemia, unspecified: Secondary | ICD-10-CM | POA: Diagnosis not present

## 2015-12-21 DIAGNOSIS — I10 Essential (primary) hypertension: Secondary | ICD-10-CM | POA: Diagnosis not present

## 2015-12-21 DIAGNOSIS — E669 Obesity, unspecified: Secondary | ICD-10-CM | POA: Diagnosis not present

## 2015-12-21 DIAGNOSIS — Z79899 Other long term (current) drug therapy: Secondary | ICD-10-CM

## 2015-12-21 LAB — HEMOGLOBIN A1C
Hgb A1c MFr Bld: 6.8 % — ABNORMAL HIGH (ref <5.7)
MEAN PLASMA GLUCOSE: 148 mg/dL

## 2015-12-21 LAB — TSH: TSH: 2.2 m[IU]/L (ref 0.40–4.50)

## 2015-12-21 NOTE — Patient Instructions (Addendum)
Get on mobic once daily with food x 2 weeks.  If your knee is not better call Dr. Lorin Mercy.   Can call Dr. Toy Cookey to get evaluated for dental sleep appliance. # [phone]  OR you can try Dr. Clearence Ped in [facility] # [phone]  We will send notes. Call and get price quote on both.   I think it is possible that you have sleep apnea. It can cause interrupted sleep, headaches, frequent awakenings, fatigue, dry mouth, fast/slow heart beats, memory issues, anxiety/depression, swelling, numbness tingling hands/feet, weight gain, shortness of breath, and the list goes on. Sleep apnea needs to be ruled out because if it is left untreated it does eventually lead to abnormal heart beats, lung failure or heart failure as well as increasing the risk of heart attack and stroke. There are masks you can wear OR a mouth piece that I can give you information about. Often times though people feel MUCH better after getting treatment.   Sleep Apnea  Sleep apnea is a sleep disorder characterized by abnormal pauses in breathing while you sleep. When your breathing pauses, the level of oxygen in your blood decreases. This causes you to move out of deep sleep and into light sleep. As a result, your quality of sleep is poor, and the system that carries your blood throughout your body (cardiovascular system) experiences stress. If sleep apnea remains untreated, the following conditions can develop:  High blood pressure (hypertension).  Coronary artery disease.  Inability to achieve or maintain an erection (impotence).  Impairment of your thought process (cognitive dysfunction). There are three types of sleep apnea: 1. Obstructive sleep apnea--Pauses in breathing during sleep because of a blocked airway. 2. Central sleep apnea--Pauses in breathing during sleep because the area of the brain that controls your breathing does not send the correct signals to the muscles that control breathing. 3. Mixed sleep  apnea--A combination of both obstructive and central sleep apnea.  RISK FACTORS The following risk factors can increase your risk of developing sleep apnea:  Being overweight.  Smoking.  Having narrow passages in your nose and throat.  Being of older age.  Being male.  Alcohol use.  Sedative and tranquilizer use.  Ethnicity. Among individuals younger than 35 years, African Americans are at increased risk of sleep apnea. SYMPTOMS   Difficulty staying asleep.  Daytime sleepiness and fatigue.  Loss of energy.  Irritability.  Loud, heavy snoring.  Morning headaches.  Trouble concentrating.  Forgetfulness.  Decreased interest in sex. DIAGNOSIS  In order to diagnose sleep apnea, your caregiver will perform a physical examination. Your caregiver may suggest that you take a home sleep test. Your caregiver may also recommend that you spend the night in a sleep lab. In the sleep lab, several monitors record information about your heart, lungs, and brain while you sleep. Your leg and arm movements and blood oxygen level are also recorded. TREATMENT The following actions may help to resolve mild sleep apnea:  Sleeping on your side.   Using a decongestant if you have nasal congestion.   Avoiding the use of depressants, including alcohol, sedatives, and narcotics.   Losing weight and modifying your diet if you are overweight. There also are devices and treatments to help open your airway:  Oral appliances. These are custom-made mouthpieces that shift your lower jaw forward and slightly open your bite. This opens your airway.  Devices that create positive airway pressure. This positive pressure "splints" your airway open  to help you breathe better during sleep. The following devices create positive airway pressure:  Continuous positive airway pressure (CPAP) device. The CPAP device creates a continuous level of air pressure with an air pump. The air is delivered to your  airway through a mask while you sleep. This continuous pressure keeps your airway open.  Nasal expiratory positive airway pressure (EPAP) device. The EPAP device creates positive air pressure as you exhale. The device consists of single-use valves, which are inserted into each nostril and held in place by adhesive. The valves create very little resistance when you inhale but create much more resistance when you exhale. That increased resistance creates the positive airway pressure. This positive pressure while you exhale keeps your airway open, making it easier to breath when you inhale again.  Bilevel positive airway pressure (BPAP) device. The BPAP device is used mainly in patients with central sleep apnea. This device is similar to the CPAP device because it also uses an air pump to deliver continuous air pressure through a mask. However, with the BPAP machine, the pressure is set at two different levels. The pressure when you exhale is lower than the pressure when you inhale.  Surgery. Typically, surgery is only done if you cannot comply with less invasive treatments or if the less invasive treatments do not improve your condition. Surgery involves removing excess tissue in your airway to create a wider passage way. Document Released: 06/15/2002 Document Revised: 10/20/2012 Document Reviewed: 11/01/2011 [facility] Patient Information 2015 [place], [place]. This information is not intended to replace advice given to you by your health care provider. Make sure you discuss any questions you have with your health care provider.

## 2015-12-21 NOTE — Progress Notes (Signed)
Assessment and Plan:  Hypertension: Continue medication, monitor blood pressure at home. Continue DASH diet. Cholesterol: Continue diet and exercise. Check cholesterol.  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 Add invokana with metformin Vitamin D Def- check level and continue medications.  Obesity with co morbidities- long discussion about weight loss, diet, and exercise OSA NOT on CPAP, discussed need to get on it- has fatigue, sleeping, etc, will give information for dental appliance.  Right knee pain- likely OA with how he is describes it and or with meniscal tear- can do mobic PRN, but suggest follow up with Dr. Lorin Mercy for possible infection Family history of MEN1 in his mother- will refer to to genetics and monitor for symptoms.   Follow up 3 months Continue diet and meds as discussed.  Future Appointments Date Time Provider [place]  03/27/2016 2:00 PM Vicie Mutters, PA-C [id number] None     HPI 60 y.o. male  presents for 3 month follow up with hypertension, hyperlipidemia, prediabetes and vitamin D. His blood pressure is normally controlled at home,  today their BP is BP: 140/90 mmHg He does workout. He denies chest pain, shortness of breath, dizziness.  He is not on cholesterol medication, he can not tolerate statins and denies myalgias. His cholesterol is not at goal. The cholesterol last visit was:   Lab Results  Component Value Date   CHOL 230* 09/15/2015   HDL 33* 09/15/2015   LDLCALC 123 09/15/2015   TRIG 371* 09/15/2015   CHOLHDL 7.0* 09/15/2015   He has been working on diet and exercise for diabetes with CKD, he is on bASA, he is on ACE, not checking sugars at home, wife Judeen Hammans is here and states that he has not been eating well, and denies paresthesia of the feet, polydipsia, polyuria and visual disturbances. Last A1C in the office was:  Lab Results  Component Value Date   HGBA1C 8.0* 09/15/2015   Lab Results  Component Value Date   GFRNONAA >89 10/19/2015   Patient is on Vitamin D supplement.   Lab Results  Component Value Date   VD25OH 20* 09/15/2015     OSA not on CPAP.  BMI is Body mass index is 32.95 kg/(m^2)., he is working on diet and exercise and has done well.  Wt Readings from Last 3 Encounters:  12/21/15 243 lb (110.224 kg)  09/15/15 242 lb (109.77 kg)  03/24/15 243 lb (110.224 kg)   Current Medications:  Current Outpatient Prescriptions on File Prior to Visit  Medication Sig Dispense Refill  . aspirin 81 MG tablet Take 81 mg by mouth daily.    . Canagliflozin-Metformin HCl ER (INVOKAMET XR) [phone] MG TB24 Take 300 mg by mouth every morning. Take 2 pills every morning for your sugar 60 tablet 5  . citalopram (CELEXA) 20 MG tablet TAKE 1 TABLET BY MOUTH EVERY DAY 90 tablet 1  . INVOKANA 300 MG TABS tablet TAKE 1 TABLET BY MOUTH DAILY BEFORE BREAKFAST 90 tablet 0  . lisinopril (PRINIVIL,ZESTRIL) 10 MG tablet Take 1 tablet (10 mg total) by mouth daily. 90 tablet 1  . omeprazole (PRILOSEC) 20 MG capsule Take 1 capsule (20 mg total) by mouth daily. 90 capsule 3   No current facility-administered medications on file prior to visit.   Medical History:  Past Medical History  Diagnosis Date  . Diabetes mellitus without complication ([place])   . Cancer ([place])   . Hypertension   . Hyperlipidemia   . GERD (gastroesophageal reflux disease)   .  Unspecified vitamin D deficiency   . OSA on CPAP 08/03/2013   Allergies:  Allergies  Allergen Reactions  . Caduet [Amlodipine-Atorvastatin]   . Statins     BLURRED VISION    Review of Systems  Constitutional: Negative.   HENT: Negative for congestion, ear discharge, ear pain, hearing loss, nosebleeds, sore throat and tinnitus.   Eyes: Negative.   Respiratory: Negative.  Negative for cough, shortness of breath and stridor.   Cardiovascular: Positive for leg swelling. Negative for chest pain, palpitations, orthopnea, claudication and PND.  Gastrointestinal:  Negative.   Genitourinary: Negative.  Negative for urgency and frequency.  Musculoskeletal: Positive for back pain and joint pain. Negative for myalgias, falls and neck pain.  Skin: Negative.   Neurological: Negative.  Negative for dizziness and headaches.  Psychiatric/Behavioral: Negative for depression, suicidal ideas, hallucinations, memory loss and substance abuse. The patient has insomnia. The patient is not nervous/anxious.      Family history- Review and unchanged Social history- Review and unchanged Physical Exam: BP 140/90 mmHg  Pulse 80  Temp(Src) 98.1 F (36.7 C) (Temporal)  Resp 16  Ht 6' (1.829 m)  Wt 243 lb (110.224 kg)  BMI 32.95 kg/m2  SpO2 97% Wt Readings from Last 3 Encounters:  12/21/15 243 lb (110.224 kg)  09/15/15 242 lb (109.77 kg)  03/24/15 243 lb (110.224 kg)   General Appearance: Well nourished, in no apparent distress. Eyes: PERRLA, EOMs, conjunctiva no swelling or erythema Sinuses: No Frontal/maxillary tenderness ENT/Mouth: Ext aud canals clear, TMs without erythema, bulging. No erythema, swelling, or exudate on post pharynx.  Tonsils not swollen or erythematous. Hearing normal.  Neck: Supple, thyroid normal.  Respiratory: Respiratory effort normal, BS equal bilaterally without rales, rhonchi, wheezing or stridor.  Cardio: RRR with no MRGs. Brisk peripheral pulses without edema.  Abdomen: Soft, + BS.  Non tender, no guarding, rebound, hernias, masses. Lymphatics: Non tender without lymphadenopathy.  Musculoskeletal: Full ROM, 5/5 strength, normal gait.  Skin: Warm, dry without rashes, lesions, ecchymosis.  Neuro: Cranial nerves intact. Normal muscle tone, no cerebellar symptoms. Sensation intact.  Psych: Awake and oriented X 3, normal affect, Insight and Judgment appropriate.    Vicie Mutters 10:31 AM

## 2015-12-22 LAB — CBC WITH DIFFERENTIAL/PLATELET
BASOS ABS: 0 {cells}/uL (ref 0–200)
Basophils Relative: 0 %
EOS PCT: 4 %
Eosinophils Absolute: 252 cells/uL (ref 15–500)
HCT: 44 % (ref 38.5–50.0)
Hemoglobin: 15 g/dL (ref 13.2–17.1)
Lymphocytes Relative: 44 %
Lymphs Abs: 2772 cells/uL (ref 850–3900)
MCH: 29.3 pg (ref 27.0–33.0)
MCHC: 34.1 g/dL (ref 32.0–36.0)
MCV: 85.9 fL (ref 80.0–100.0)
MONOS PCT: 7 %
MPV: 9 fL (ref 7.5–12.5)
Monocytes Absolute: 441 cells/uL (ref 200–950)
NEUTROS PCT: 45 %
Neutro Abs: 2835 cells/uL (ref 1500–7800)
PLATELETS: 179 10*3/uL (ref 140–400)
RBC: 5.12 MIL/uL (ref 4.20–5.80)
RDW: 13.8 % (ref 11.0–15.0)
WBC: 6.3 10*3/uL (ref 3.8–10.8)

## 2015-12-22 LAB — HEPATIC FUNCTION PANEL
ALT: 30 U/L (ref 9–46)
AST: 21 U/L (ref 10–35)
Albumin: 4.4 g/dL (ref 3.6–5.1)
Alkaline Phosphatase: 54 U/L (ref 40–115)
BILIRUBIN DIRECT: 0.1 mg/dL (ref <=0.2)
BILIRUBIN TOTAL: 0.8 mg/dL (ref 0.2–1.2)
Indirect Bilirubin: 0.7 mg/dL (ref 0.2–1.2)
Total Protein: 7.2 g/dL (ref 6.1–8.1)

## 2015-12-22 LAB — BASIC METABOLIC PANEL WITH GFR
BUN: 13 mg/dL (ref 7–25)
CALCIUM: 9.5 mg/dL (ref 8.6–10.3)
CO2: 20 mmol/L (ref 20–31)
CREATININE: 1 mg/dL (ref 0.70–1.25)
Chloride: 103 mmol/L (ref 98–110)
GFR, Est African American: >89 mL/min (ref >=60)
GFR, Est Non African American: 81 mL/min (ref >=60)
Glucose, Bld: 130 mg/dL — ABNORMAL HIGH (ref 65–99)
Potassium: 4.3 mmol/L (ref 3.5–5.3)
SODIUM: 137 mmol/L (ref 135–146)

## 2015-12-22 LAB — LIPID PANEL
CHOL/HDL RATIO: 6.5 ratio — AB (ref <=5.0)
CHOLESTEROL: 240 mg/dL — AB (ref 125–200)
HDL: 37 mg/dL — ABNORMAL LOW (ref >=40)
LDL Cholesterol: 133 mg/dL — ABNORMAL HIGH (ref <130)
Triglycerides: 349 mg/dL — ABNORMAL HIGH (ref <150)
VLDL: 70 mg/dL — ABNORMAL HIGH (ref <30)

## 2015-12-22 LAB — MAGNESIUM: MAGNESIUM: 1.8 mg/dL (ref 1.5–2.5)

## 2015-12-22 LAB — VITAMIN D 25 HYDROXY (VIT D DEFICIENCY, FRACTURES): VIT D 25 HYDROXY: 26 ng/mL — AB (ref 30–100)

## 2015-12-23 LAB — PTH, INTACT AND CALCIUM
Calcium: 9.5 mg/dL (ref 8.4–10.5)
PTH: 32 pg/mL (ref 14–64)

## 2015-12-27 ENCOUNTER — Other Ambulatory Visit: Payer: Self-pay

## 2015-12-27 MED ORDER — CANAGLIFLOZIN-METFORMIN HCL ER 150-1000 MG PO TB24: 300.0000 mg | ORAL_TABLET | Freq: Every morning | ORAL | Status: DC

## 2016-01-16 MED FILL — OMEPRAZOLE DR 20 MG CAPSULE: 20 | 90 days supply | Qty: 90 | Fill #1

## 2016-01-16 MED FILL — LISINOPRIL 10 MG TABLET: 10 | 90 days supply | Qty: 90 | Fill #1

## 2016-01-30 MED FILL — INVOKAMET XR 150-1,000 MG T: 150-1000 | 60 days supply | Qty: 120 | Fill #2

## 2016-02-01 ENCOUNTER — Telehealth: Payer: Self-pay | Admitting: Neurology

## 2016-02-01 NOTE — Telephone Encounter (Signed)
Pt's wife called after speaking with pts pcp, who told her to call our office to make appt. After talking with her more she realized that they have sent a referral to our office, which is why they told her to call us. I have asked that she give our office a few days to enter it into the computer and we will call her or the pt to make appt. She expressed understanding.

## 2016-02-01 NOTE — Telephone Encounter (Signed)
Patient's wife is calling. The patient needs to be set up for a CPAP machine.

## 2016-02-01 NOTE — Telephone Encounter (Signed)
Pt has not been seen at [facility] in at least three years. Dr. Brett Fairy cannot generate orders for this pt without seeing the pt. Pt needs a referral and then an appt with Dr. Brett Fairy.  I called pt, no answer, left a message asking him to call me back.  If pt calls back, please advise him of this information.

## 2016-03-27 ENCOUNTER — Encounter: Payer: Self-pay | Admitting: Physician Assistant

## 2016-03-27 ENCOUNTER — Ambulatory Visit (INDEPENDENT_AMBULATORY_CARE_PROVIDER_SITE_OTHER): Payer: 59 | Admitting: Physician Assistant

## 2016-03-27 VITALS — BP 130/80 | HR 71 | Temp 97.9°F | Resp 16 | Ht 72.0 in | Wt 239.8 lb

## 2016-03-27 DIAGNOSIS — Z85828 Personal history of other malignant neoplasm of skin: Secondary | ICD-10-CM

## 2016-03-27 DIAGNOSIS — Z136 Encounter for screening for cardiovascular disorders: Secondary | ICD-10-CM

## 2016-03-27 DIAGNOSIS — G4733 Obstructive sleep apnea (adult) (pediatric): Secondary | ICD-10-CM | POA: Diagnosis not present

## 2016-03-27 DIAGNOSIS — F411 Generalized anxiety disorder: Secondary | ICD-10-CM | POA: Diagnosis not present

## 2016-03-27 DIAGNOSIS — E119 Type 2 diabetes mellitus without complications: Secondary | ICD-10-CM

## 2016-03-27 DIAGNOSIS — K219 Gastro-esophageal reflux disease without esophagitis: Secondary | ICD-10-CM

## 2016-03-27 DIAGNOSIS — E559 Vitamin D deficiency, unspecified: Secondary | ICD-10-CM

## 2016-03-27 DIAGNOSIS — Z125 Encounter for screening for malignant neoplasm of prostate: Secondary | ICD-10-CM

## 2016-03-27 DIAGNOSIS — R6889 Other general symptoms and signs: Secondary | ICD-10-CM

## 2016-03-27 DIAGNOSIS — D649 Anemia, unspecified: Secondary | ICD-10-CM | POA: Diagnosis not present

## 2016-03-27 DIAGNOSIS — Z79899 Other long term (current) drug therapy: Secondary | ICD-10-CM

## 2016-03-27 DIAGNOSIS — Z0001 Encounter for general adult medical examination with abnormal findings: Secondary | ICD-10-CM

## 2016-03-27 DIAGNOSIS — E785 Hyperlipidemia, unspecified: Secondary | ICD-10-CM | POA: Diagnosis not present

## 2016-03-27 DIAGNOSIS — Z9989 Dependence on other enabling machines and devices: Secondary | ICD-10-CM

## 2016-03-27 DIAGNOSIS — I10 Essential (primary) hypertension: Secondary | ICD-10-CM

## 2016-03-27 DIAGNOSIS — E1142 Type 2 diabetes mellitus with diabetic polyneuropathy: Secondary | ICD-10-CM

## 2016-03-27 DIAGNOSIS — I8390 Asymptomatic varicose veins of unspecified lower extremity: Secondary | ICD-10-CM

## 2016-03-27 LAB — BASIC METABOLIC PANEL WITH GFR
BUN: 18 mg/dL (ref 7–25)
CHLORIDE: 100 mmol/L (ref 98–110)
CO2: 22 mmol/L (ref 20–31)
CREATININE: 1.33 mg/dL — AB (ref 0.70–1.25)
Calcium: 9.7 mg/dL (ref 8.6–10.3)
GFR, EST NON AFRICAN AMERICAN: 57 mL/min — AB (ref >=60)
GFR, Est African American: 66 mL/min (ref >=60)
Glucose, Bld: 185 mg/dL — ABNORMAL HIGH (ref 65–99)
POTASSIUM: 3.9 mmol/L (ref 3.5–5.3)
Sodium: 137 mmol/L (ref 135–146)

## 2016-03-27 LAB — CBC WITH DIFFERENTIAL/PLATELET
BASOS PCT: 0 %
Basophils Absolute: 0 cells/uL (ref 0–200)
Eosinophils Absolute: 79 cells/uL (ref 15–500)
Eosinophils Relative: 1 %
HEMATOCRIT: 42 % (ref 38.5–50.0)
Hemoglobin: 14.9 g/dL (ref 13.2–17.1)
LYMPHS ABS: 2844 {cells}/uL (ref 850–3900)
LYMPHS PCT: 36 %
MCH: 29.8 pg (ref 27.0–33.0)
MCHC: 35.5 g/dL (ref 32.0–36.0)
MCV: 84 fL (ref 80.0–100.0)
MONO ABS: 632 {cells}/uL (ref 200–950)
MPV: 9.7 fL (ref 7.5–12.5)
Monocytes Relative: 8 %
NEUTROS ABS: 4345 {cells}/uL (ref 1500–7800)
Neutrophils Relative %: 55 %
Platelets: 217 10*3/uL (ref 140–400)
RBC: 5 MIL/uL (ref 4.20–5.80)
RDW: 13.5 % (ref 11.0–15.0)
WBC: 7.9 10*3/uL (ref 3.8–10.8)

## 2016-03-27 LAB — HEPATIC FUNCTION PANEL
ALBUMIN: 4.6 g/dL (ref 3.6–5.1)
ALK PHOS: 44 U/L (ref 40–115)
ALT: 27 U/L (ref 9–46)
AST: 22 U/L (ref 10–35)
BILIRUBIN DIRECT: 0.1 mg/dL (ref <=0.2)
Indirect Bilirubin: 0.7 mg/dL (ref 0.2–1.2)
Total Bilirubin: 0.8 mg/dL (ref 0.2–1.2)
Total Protein: 7.2 g/dL (ref 6.1–8.1)

## 2016-03-27 LAB — LIPID PANEL
CHOL/HDL RATIO: 6.4 ratio — AB (ref <=5.0)
Cholesterol: 245 mg/dL — ABNORMAL HIGH (ref 125–200)
HDL: 38 mg/dL — ABNORMAL LOW (ref >=40)
LDL CALC: 137 mg/dL — AB (ref <130)
Triglycerides: 352 mg/dL — ABNORMAL HIGH (ref <150)
VLDL: 70 mg/dL — ABNORMAL HIGH (ref <30)

## 2016-03-27 LAB — PSA: PSA: 0.2 ng/mL (ref <=4.0)

## 2016-03-27 LAB — MAGNESIUM: Magnesium: 1.8 mg/dL (ref 1.5–2.5)

## 2016-03-27 LAB — VITAMIN B12: VITAMIN B 12: 394 pg/mL (ref 200–1100)

## 2016-03-27 LAB — TSH: TSH: 2.67 m[IU]/L (ref 0.40–4.50)

## 2016-03-27 MED ORDER — CANAGLIFLOZIN-METFORMIN HCL ER 150-1000 MG PO TB24: 300.0000 mg | ORAL_TABLET | Freq: Every morning | ORAL | 2 refills | Status: DC

## 2016-03-27 MED FILL — INVOKAMET XR 150-1,000 MG T: 150-1000 | 30 days supply | Qty: 60 | Fill #0

## 2016-03-27 NOTE — Patient Instructions (Addendum)
Increase celexa to one pill or 20mg  Tumeric with black pepper extract is a great natural antiinflammatory that helps with arthritis and aches and pain. Can get from [employer] or any health food store. Need to take at least 800mg  twice a day with food.  Add 5000 IU vitamin D     Vitamin D goal is between 60-80  Please make sure that you are taking your Vitamin D as directed.   It is very important as a natural anti-inflammatory   helping hair, skin, and nails, as well as reducing stroke and heart attack risk.   It helps your bones and helps with mood.  It also decreases numerous cancer risks so please take it as directed.   Low Vit D is associated with a 200-300% higher risk for CANCER   and 200-300% higher risk for HEART   ATTACK  &  STROKE.    .....................................Marland Kitchen  It is also associated with higher death rate at younger ages,   autoimmune diseases like Rheumatoid arthritis, Lupus, Multiple Sclerosis.     Also many other serious conditions, like depression, Alzheimer's  Dementia, infertility, muscle aches, fatigue, fibromyalgia - just to name a few.  +++++++++++++++++++  Can get liquid vitamin D from [facility] here in [place] at  [facility] alternatives [address] [place], [place] [address]  GETTING OFF OF PPI's    Nexium/protonix/prilosec/Omeprazole/Dexilant/Aciphex are called PPI's, they are great at healing your stomach but should only be taken for a short period of time.     Recent studies have shown that taken for a long time they  can increase the risk of osteoporosis (weakening of your bones), pneumonia, low magnesium, restless legs, Cdiff (infection that causes diarrhea), DEMENTIA and most recently kidney damage / disease / insufficiency.     Due to this information we want to try to stop the PPI but if you try to stop it abruptly this can cause rebound acid and worsening symptoms.   So this is how we want you to get off the PPI:  - Start taking  the nexium/protonix/prilosec/PPI  every other day with  zantac (ranitidine) 2 x a day for 2-4 weeks - some people stay on this dosage and can not taper off further. Our main goal is to limit the dosage and amount you are taking so if you need to stay on this dose.   - then decrease the PPI to every 3 days while taking the zantac (ranitidine) 300mg  twice a day the other  days for 2-4  Weeks  - then you can try the zantac (ranitidine) 300mg  once at night or up to 2 x day as needed.  - you can continue on this once at night or stop all together  - Avoid alcohol, spicy foods, NSAIDS (aleve, ibuprofen) at this time. See foods below.   +++++++++++++++++++++++++++++++++++++++++++  Food Choices for Gastroesophageal Reflux Disease  When you have gastroesophageal reflux disease (GERD), the foods you eat and your eating habits are very important. Choosing the right foods can help ease the discomfort of GERD. WHAT GENERAL GUIDELINES DO I NEED TO FOLLOW?  Choose fruits, vegetables, whole grains, low-fat dairy products, and low-fat meat, fish, and poultry.  Limit fats such as oils, salad dressings, butter, nuts, and avocado.  Keep a food diary to identify foods that cause symptoms.  Avoid foods that cause reflux. These may be different for different people.  Eat frequent small meals instead of three large meals each day.  Eat your meals  slowly, in a relaxed setting.  Limit fried foods.  Cook foods using methods other than frying.  Avoid drinking alcohol.  Avoid drinking large amounts of liquids with your meals.  Avoid bending over or lying down until 2-3 hours after eating.   WHAT FOODS ARE NOT RECOMMENDED? The following are some foods and drinks that may worsen your symptoms:  Vegetables Tomatoes. Tomato juice. Tomato and spaghetti sauce. Chili peppers. Onion and garlic. Horseradish. Fruits Oranges, grapefruit, and lemon (fruit and juice). Meats High-fat meats, fish, and poultry.  This includes hot dogs, ribs, ham, sausage, salami, and bacon. Dairy Whole milk and chocolate milk. Sour cream. Cream. Butter. Ice cream. Cream cheese.  Beverages Coffee and tea, with or without caffeine. Carbonated beverages or energy drinks. Condiments Hot sauce. Barbecue sauce.  Sweets/Desserts Chocolate and cocoa. Donuts. Peppermint and spearmint. Fats and Oils High-fat foods, including [place] fries and potato chips. Other Vinegar. Strong spices, such as black pepper, white pepper, red pepper, cayenne, curry powder, cloves, ginger, and chili powder. Nexium/protonix/prilosec are called PPI's, they are great at healing your stomach but should only be taken for a short period of time.   Peripheral Neuropathy Peripheral neuropathy is a type of nerve damage. It affects nerves that carry signals between the spinal cord and other parts of the body. These are called peripheral nerves. With peripheral neuropathy, one nerve or a group of nerves may be damaged.  CAUSES  Many things can damage peripheral nerves. For some people with peripheral neuropathy, the cause is unknown. Some causes include:  Diabetes. This is the most common cause of peripheral neuropathy.  Injury to a nerve.  Pressure or stress on a nerve that lasts a long time.  Too little vitamin B. Alcoholism can lead to this.  Infections.  Autoimmune diseases, such as multiple sclerosis and systemic lupus erythematosus.  Inherited nerve diseases.  Some medicines, such as cancer drugs.  Toxic substances, such as lead and mercury.  Too little blood flowing to the legs.  Kidney disease.  Thyroid disease. SIGNS AND SYMPTOMS  Different people have different symptoms. The symptoms you have will depend on which of your nerves is damaged. Common symptoms include:  Loss of feeling (numbness) in the feet and hands.  Tingling in the feet and hands.  Pain that burns.  Very sensitive skin.  Weakness.  Not being able to  move a part of the body (paralysis).  Muscle twitching.  Clumsiness or poor coordination.  Loss of balance.  Not being able to control your bladder.  Feeling dizzy.  Sexual problems. DIAGNOSIS  Peripheral neuropathy is a symptom, not a disease. Finding the cause of peripheral neuropathy can be hard. To figure that out, your health care provider will take a medical history and do a physical exam. A neurological exam will also be done. This involves checking things affected by your brain, spinal cord, and nerves (nervous system). For example, your health care provider will check your reflexes, how you move, and what you can feel.  Other types of tests may also be ordered, such as:  Blood tests.  A test of the fluid in your spinal cord.  Imaging tests, such as CT scans or an MRI.  Electromyography (EMG). This test checks the nerves that control muscles.  Nerve conduction velocity tests. These tests check how fast messages pass through your nerves.  Nerve biopsy. A small piece of nerve is removed. It is then checked under a microscope. TREATMENT   Medicine is often used  to treat peripheral neuropathy. Medicines may include:  Pain-relieving medicines. Prescription or over-the-counter medicine may be suggested.  Antiseizure medicine. This may be used for pain.  Antidepressants. These also may help ease pain from neuropathy.  Lidocaine. This is a numbing medicine. You might wear a patch or be given a shot.  Mexiletine. This medicine is typically used to help control irregular heart rhythms.  Surgery. Surgery may be needed to relieve pressure on a nerve or to destroy a nerve that is causing pain.  Physical therapy to help movement.  Assistive devices to help movement. HOME CARE INSTRUCTIONS   Only take over-the-counter or prescription medicines as directed by your health care provider. Follow the instructions carefully for any given medicines. Do not take any other medicines  without first getting approval from your health care provider.  If you have diabetes, work closely with your health care provider to keep your blood sugar under control.  If you have numbness in your feet:  Check every day for signs of injury or infection. Watch for redness, warmth, and swelling.  Wear padded socks and comfortable shoes. These help protect your feet.  Do not do things that put pressure on your damaged nerve.  Do not smoke. Smoking keeps blood from getting to damaged nerves.  Avoid or limit alcohol. Too much alcohol can cause a lack of B vitamins. These vitamins are needed for healthy nerves.  Develop a good support system. Coping with peripheral neuropathy can be stressful. Talk to a mental health specialist or join a support group if you are struggling.  Follow up with your health care provider as directed. SEEK MEDICAL CARE IF:   You have new signs or symptoms of peripheral neuropathy.  You are struggling emotionally from dealing with peripheral neuropathy.  You have a fever. SEEK IMMEDIATE MEDICAL CARE IF:   You have an injury or infection that is not healing.  You feel very dizzy or begin vomiting.  You have chest pain.  You have trouble breathing.   This information is not intended to replace advice given to you by your health care provider. Make sure you discuss any questions you have with your health care provider.   Document Released: 06/15/2002 Document Revised: 03/07/2011 Document Reviewed: 03/02/2013 Elsevier Interactive Patient Education [employer].

## 2016-03-27 NOTE — Progress Notes (Signed)
Complete Physical  Assessment and Plan: Diabetes mellitus without complication Discussed general issues about diabetes pathophysiology and management., Educational material distributed., Suggested low cholesterol diet., Encouraged aerobic exercise., Discussed foot care., Reminded to get yearly retinal exam. - Hemoglobin A1c - Insulin, fasting  Hyperlipidemia -continue medications, check lipids, decrease fatty foods, increase activity.  - Lipid panel   Essential hypertension - continue medications, DASH diet, exercise and monitor at home. Call if greater than 130/80.  - CBC with Differential/Platelet - BASIC METABOLIC PANEL WITH GFR - Hepatic function panel - TSH - Urinalysis, Routine w reflex microscopic (not at [facility]) - Microalbumin / creatinine urine ratio - EKG 12-Lead  Morbid Obesity Obesity with co morbidities- long discussion about weight loss, diet, and exercise  OSA NOT on CPAP Stressed importance of CPAP Will send in referral for sleep study  Gastroesophageal reflux disease, esophagitis presence not specified Continue PPI/H2 blocker, diet discussed  Vitamin D deficiency - Vit D  25 hydroxy (rtn osteoporosis monitoring)  History of SCC (squamous cell carcinoma) of skin Follow up DERM  Varicose veins Varicose veins- weight loss discussed, continue compression stockings and elevation\  Encounter for general adult medical examination with abnormal findings - CBC with Differential/Platelet - BASIC METABOLIC PANEL WITH GFR - Hepatic function panel - TSH - Lipid panel - Hemoglobin A1c - Insulin, fasting - Magnesium - Vit D  25 hydroxy (rtn osteoporosis monitoring) - Urinalysis, Routine w reflex microscopic (not at [facility]) - Microalbumin / creatinine urine ratio - Iron and TIBC - Ferritin - Vitamin B12 - PSA - EKG 12-Lead  Medication management - Magnesium  Anemia, unspecified anemia type - Vitamin B12  Screening for prostate cancer - PSA  Diabetes  with peripheral neuropathy Decrease sugars, monitor feet.   Discussed med's effects and SE's. Screening labs and tests as requested with regular follow-up as recommended. Over 40 minutes of exam, counseling, chart review and critical decision making was performed  HPI Patient presents for a complete physical.  60 y.o. WM.  His blood pressure has been controlled at home, monitoring at home, today their BP is BP: 130/80 He does not workout, but stays active,working for [employer], works 3 days a week, lots of walking, retired Psychologist, occupational. He denies chest pain, shortness of breath, dizziness.  He is on celexa for anxiety, only on 1/2 a pill.  He is not on cholesterol medication and denies myalgias. His cholesterol is not at goal. The cholesterol last visit was:   Lab Results  Component Value Date   CHOL 240 (H) 12/21/2015   HDL 37 (L) 12/21/2015   LDLCALC 133 (H) 12/21/2015   TRIG 349 (H) 12/21/2015   CHOLHDL 6.5 (H) 12/21/2015   He has been working on diet and exercise for diabetes with CKD, he is on bASA, he is on ACE/ARB and denies paresthesia of the feet, polydipsia, polyuria and visual disturbances. Last A1C in the office was:  Lab Results  Component Value Date   HGBA1C 6.8 (H) 12/21/2015   Lab Results  Component Value Date   GFRNONAA 81 12/21/2015   Patient is on Vitamin D supplement, not on vitamin D.   Lab Results  Component Value Date   VD25OH 26 (L) 12/21/2015     Last PSA was: Lab Results  Component Value Date   PSA 0.13 03/24/2015   BMI is Body mass index is 32.52 kg/m., he is working on diet and exercise. He has OSA, not on CPAP.  Wt Readings from Last 3 Encounters:  03/27/16 239 lb 12.8 oz (108.8 kg)  12/21/15 243 lb (110.2 kg)  09/15/15 242 lb (109.8 kg)    Current Medications:  Current Outpatient Prescriptions on File Prior to Visit  Medication Sig Dispense Refill  . aspirin 81 MG tablet Take 81 mg by mouth daily.    . Canagliflozin-Metformin HCl ER  (INVOKAMET XR) [phone] MG TB24 Take 300 mg by mouth every morning. Take 2 pills every morning for your sugar 60 tablet 2  . citalopram (CELEXA) 20 MG tablet TAKE 1 TABLET BY MOUTH EVERY DAY 90 tablet 1  . lisinopril (PRINIVIL,ZESTRIL) 10 MG tablet Take 1 tablet (10 mg total) by mouth daily. 90 tablet 1  . omeprazole (PRILOSEC) 20 MG capsule Take 1 capsule (20 mg total) by mouth daily. 90 capsule 3   No current facility-administered medications on file prior to visit.    Health Maintenance:  Immunization History  Administered Date(s) Administered  . Influenza-Unspecified 05/07/2013  . Pneumococcal Polysaccharide-23 06/24/2013  . Td 07/09/2006   Tetanus: 2008 Pneumovax: 2014 Prevnar 13: Flu vaccine: 2015 will get on the 20th with TB test Zostavax: DEXA: Colonoscopy: Dr. Benson Norway 2008  EGD: Eye Exam: [facility] 2016 Dentist:  Patient Care Team: Unk Pinto, MD as PCP - General (Internal Medicine)  Dr. Johnsie Cancel 2007 normal stress test Dr. Kellie Simmering Dr. Benson Norway Dr. Lorin Mercy  Dr. Kristian Covey  Medical History:  Past Medical History:  Diagnosis Date  . Cancer ([place])   . Diabetes mellitus without complication ([place])   . GERD (gastroesophageal reflux disease)   . Hyperlipidemia   . Hypertension   . OSA on CPAP 08/03/2013  . Unspecified vitamin D deficiency    Allergies Allergies  Allergen Reactions  . Caduet [Amlodipine-Atorvastatin]   . Statins     BLURRED VISION    SURGICAL HISTORY He  has a past surgical history that includes orthopedic surgeries and Laser ablation. FAMILY HISTORY His family history includes Cancer in his mother; Diabetes in his sister; Heart disease in his father; Hyperlipidemia in his father; Hypertension in his brother. SOCIAL HISTORY He  reports that he quit smoking about 22 years ago. He does not have any smokeless tobacco history on file. He reports that he does not drink alcohol or use drugs.  Review of Systems:  Review of Systems  Constitutional:  Negative.   HENT: Positive for congestion. Negative for ear discharge, ear pain, hearing loss, nosebleeds, sore throat and tinnitus.   Eyes: Negative.   Respiratory: Negative.  Negative for cough, shortness of breath and stridor.   Cardiovascular: Positive for leg swelling. Negative for chest pain, palpitations, orthopnea, claudication and PND.  Gastrointestinal: Negative.   Genitourinary: Negative.  Negative for frequency and urgency.  Musculoskeletal: Positive for back pain and joint pain. Negative for falls, myalgias and neck pain.  Skin: Negative.   Neurological: Negative.  Negative for dizziness and headaches.  Psychiatric/Behavioral: Negative for depression, hallucinations, memory loss, substance abuse and suicidal ideas. The patient has insomnia. The patient is not nervous/anxious.     Physical Exam: Estimated body mass index is 32.52 kg/m as calculated from the following:   Height as of this encounter: 6' (1.829 m).   Weight as of this encounter: 239 lb 12.8 oz (108.8 kg). BP 130/80   Pulse 71   Temp 97.9 F (36.6 C)   Resp 16   Ht 6' (1.829 m)   Wt 239 lb 12.8 oz (108.8 kg)   SpO2 97%   BMI 32.52 kg/m  General Appearance:  Well nourished, in no apparent distress.  Eyes: PERRLA, EOMs, conjunctiva no swelling or erythema, normal fundi and vessels.  Sinuses: No Frontal/maxillary tenderness  ENT/Mouth: Ext aud canals clear, normal light reflex with TMs without erythema, bulging. Good dentition. No erythema, swelling, or exudate on post pharynx. Tonsils not swollen or erythematous. Hearing normal.  Neck: Supple, thyroid normal. No bruits  Respiratory: Respiratory effort normal, BS equal bilaterally without rales, rhonchi, wheezing or stridor.  Cardio: RRR without murmurs, rubs or gallops. Brisk peripheral pulses without edema.  Chest: symmetric, with normal excursions and percussion.  Abdomen: Soft, nontender, no guarding, rebound, hernias, masses, or organomegaly.   Lymphatics: Non tender without lymphadenopathy.  Genitourinary: defer Musculoskeletal: Full ROM all peripheral extremities,5/5 strength, and normal gait.Bilateral varicose veins.   Skin: Warm, dry without rashes, lesions, ecchymosis. Neuro: Cranial nerves intact, reflexes equal bilaterally. Normal muscle tone, no cerebellar symptoms. Sensation decreased bilateral feet to ankle. Psych: Awake and oriented X 3, normal affect, Insight and Judgment appropriate.   EKG: WNL no changes. AORTA SCAN: defer  Vicie Mutters 1:56 PM [facility]

## 2016-03-28 ENCOUNTER — Encounter: Payer: Self-pay | Admitting: Physician Assistant

## 2016-03-28 ENCOUNTER — Other Ambulatory Visit: Payer: Self-pay | Admitting: Physician Assistant

## 2016-03-28 LAB — URINALYSIS, ROUTINE W REFLEX MICROSCOPIC
BILIRUBIN URINE: NEGATIVE
Hgb urine dipstick: NEGATIVE
Ketones, ur: NEGATIVE
Leukocytes, UA: NEGATIVE
Nitrite: NEGATIVE
PH: 5.5 (ref 5.0–8.0)
Protein, ur: NEGATIVE
SPECIFIC GRAVITY, URINE: 1.037 — AB (ref 1.001–1.035)

## 2016-03-28 LAB — HEMOGLOBIN A1C
HEMOGLOBIN A1C: 6.3 % — AB (ref <5.7)
MEAN PLASMA GLUCOSE: 134 mg/dL

## 2016-03-28 LAB — MICROALBUMIN / CREATININE URINE RATIO
CREATININE, URINE: 138 mg/dL (ref 20–370)
Microalb Creat Ratio: 7 mcg/mg creat (ref <30)
Microalb, Ur: 1 mg/dL

## 2016-03-28 LAB — URINALYSIS, MICROSCOPIC ONLY
BACTERIA UA: NONE SEEN [HPF]
CASTS: NONE SEEN [LPF]
Crystals: NONE SEEN [HPF]
RBC / HPF: NONE SEEN RBC/HPF (ref <=2)
SQUAMOUS EPITHELIAL / LPF: NONE SEEN [HPF] (ref <=5)
WBC, UA: NONE SEEN WBC/HPF (ref <=5)
YEAST: NONE SEEN [HPF]

## 2016-03-28 LAB — VITAMIN D 25 HYDROXY (VIT D DEFICIENCY, FRACTURES): Vit D, 25-Hydroxy: 26 ng/mL — ABNORMAL LOW (ref 30–100)

## 2016-03-28 MED ORDER — FENOFIBRATE MICRONIZED 134 MG PO CAPS: 134.0000 mg | ORAL_CAPSULE | Freq: Every day | ORAL | 1 refills | Status: DC

## 2016-03-28 MED FILL — FENOFIBRATE 134 MG CAPSULE: 134 | 30 days supply | Qty: 30 | Fill #0

## 2016-04-17 ENCOUNTER — Encounter: Payer: Self-pay | Admitting: Neurology

## 2016-04-17 ENCOUNTER — Ambulatory Visit (INDEPENDENT_AMBULATORY_CARE_PROVIDER_SITE_OTHER): Payer: 59 | Admitting: Neurology

## 2016-04-17 VITALS — BP 130/60 | HR 68 | Resp 20 | Ht 72.0 in | Wt 243.0 lb

## 2016-04-17 DIAGNOSIS — G4733 Obstructive sleep apnea (adult) (pediatric): Secondary | ICD-10-CM | POA: Diagnosis not present

## 2016-04-17 DIAGNOSIS — G4761 Periodic limb movement disorder: Secondary | ICD-10-CM

## 2016-04-17 DIAGNOSIS — F518 Other sleep disorders not due to a substance or known physiological condition: Secondary | ICD-10-CM | POA: Diagnosis not present

## 2016-04-17 MED ORDER — CLONAZEPAM 0.5 MG PO TABS: 0.2500 mg | ORAL_TABLET | Freq: Every day | ORAL | 1 refills | Status: DC

## 2016-04-17 MED ORDER — MELATONIN 3 MG PO TBDP: 3.0000 mg | ORAL_TABLET | Freq: Every evening | ORAL | 0 refills | Status: DC

## 2016-04-17 NOTE — Progress Notes (Signed)
SLEEP MEDICINE CLINIC   Provider:  Larey Seat, M D  Referring Provider: Unk Pinto, MD Primary Care Physician:  Alesia Richards, MD  Chief Complaint  Patient presents with  . Follow-up    never followed up after sleep study    HPI:  Glenn Stephens is a 60 y.o. male , seen here as a referral  from Dr. Melford Aase for a sleep consultation, Chief complaint according to patient : My wife complaints of my legs moving all night.   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  He  is seen here today not as a new patient but following up from a sleep study that had been performed almost 3 years ago on 07/22/2013, at the time he had complained about excessive daytime sleepiness and endorsed the Epworth score at 16 out of 24 points.  He reached an AHI of 20 5. 6 which would place him into the moderate category of apnea he did not have REM sleep, and he slept 56.3% in supine position, all apneas occurred while on his back. Supine AHI was 45.5 and nonsupine 0.0 also had frequent periodic limb movements but infrequent arousals 2.2 times per hour of sleep, he remains is a regular sinus rhythm heartbeat and isolated ectopic beats the patient was titrated with a night beginning at 5 cm water pressure on CPAP and advanced to 7 cm water but he had more periodic limb movements after CPAP was applied. His PLM arousal index rose to 8.4. He was prescribed a CPAP at 7 cm water with 2 cm flex but he remained a very restless sleeper at home. Due to an issue with insurance a CPAP was never issued.  His wife reports that he has active nightmares that he seems to reenact, he falls out of the bed. He is a very restless sleeper. He thrashes and kicks. Some of the dreams he may remember others not. After he fell out of the bed he actually continue to sleep on the floor. His nightmares often have a threatening content he feels that  he is attacked by a large animal like a bear, but he has to run or defend himself. These very active periods of sleep last about 2 minutes and he usually does not leaves the bed, but stays in bed. This has the hallmarks of REM sleep behavior disorder. He reports no memory difficulties.   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 Since he works still a late shift he often doesn't get home before 11:00 and bedtime may be as late as 1 AM. Once in bed he usually falls asleep, his wife insists that this is prompt . He snacks late at night.  Tendency to fall asleep on the couch watching TV before he goes to the bedroom. Once asleep he usually stays asleep that he seems to be very active in his sleep. The bedroom is cool, quiet and dark. He shares a bedroom with his wife  ( she sleeps in another bed in the same room ) and she reports having been kicked or hit inadvertently at night. He feels not relaxed - he is always "toned' . He moves rapidly and frequently.  He may have 2-3 bathroom breaks each night, can get back to sleep easily. He rises  at 8- 9 AM , depending on the late shift the day before. He requires only 6 hours of sleep to feel rested and restored.  He take 3 cups of coffee in AM, after he wakes (without headaches, no palpitations no diaphoresis).   He has been questioning if he has PTSD, as he seen gruesome cases in his 77 year career.    Sleep medical history and family sleep history: night shift worker.  Father had severe tremor, not PD.    Social history: married , retired Arts administrator, Designer, fashion/clothing now.   Review of Systems: Out of a complete 14 system review, the patient complains of only the following symptoms, and all other reviewed systems are negative.   Epworth score 7 , Fatigue severity score 17  , depression score 2/15    Social History   Social History  . Marital  status: Married    Spouse name: N/A  . Number of children: N/A  . Years of education: N/A   Occupational History  . Not on file.   Social History Main Topics  . Smoking status: Former Smoker    Quit date: 06/24/1993  . Smokeless tobacco: Not on file  . Alcohol use No  . Drug use: No  . Sexual activity: Not on file   Other Topics Concern  . Not on file   Social History Narrative  . No narrative on file    Family History  Problem Relation Age of Onset  . Cancer Mother     melanoma with mets   . Heart disease Father   . Hyperlipidemia Father   . Diabetes Sister   . Hypertension Brother     Past Medical History:  Diagnosis Date  . Cancer ([place])   . Diabetes mellitus without complication ([place])   . GERD (gastroesophageal reflux disease)   . Hyperlipidemia   . Hypertension   . OSA on CPAP 08/03/2013  . Unspecified vitamin D deficiency     Past Surgical History:  Procedure Laterality Date  . LASER ABLATION     veins  . orthopedic surgeries     knee,foot    Current Outpatient Prescriptions  Medication Sig Dispense Refill  . aspirin 81 MG tablet Take 81 mg by mouth daily.    . Canagliflozin-Metformin HCl ER (INVOKAMET XR) [phone] MG TB24 Take 300 mg by mouth every morning. Take 2 pills every morning for your sugar (Patient taking differently: Take 300 mg by mouth every morning. Take 1 pills every morning for your sugar) 60 tablet 2  . citalopram (CELEXA) 20 MG tablet TAKE 1 TABLET BY MOUTH EVERY DAY 90 tablet 1  . fenofibrate micronized (LOFIBRA) 134 MG capsule Take 1 capsule (134 mg total) by mouth daily before breakfast. 30 capsule 1  . lisinopril (PRINIVIL,ZESTRIL) 10 MG tablet Take 1 tablet (10 mg total) by mouth daily. 90 tablet 1  . omeprazole (PRILOSEC) 20 MG capsule Take 1 capsule (20 mg total) by mouth daily. (Patient taking differently: Take 20 mg by mouth every other day. ) 90 capsule 3  . Thiamine Mononitrate (VITAMIN B1 PO) Take by mouth.    . TURMERIC  PO Take by mouth.     No current facility-administered medications for this visit.     Allergies as of 04/17/2016 - Review Complete 04/17/2016  Allergen Reaction Noted  . Caduet [amlodipine-atorvastatin]  02/17/2013  . Statins  06/02/2013    Vitals: BP 130/60   Pulse 68   Resp 20   Ht  6' (1.829 m)   Wt 243 lb (110.2 kg)   BMI 32.96 kg/m  Last Weight:  Wt Readings from Last 1 Encounters:  04/17/16 243 lb (110.2 kg)   [id number] mass index is 32.96 kg/m.     Last Height:   Ht Readings from Last 1 Encounters:  04/17/16 6' (1.829 m)    Physical exam:  General: The patient is awake, alert and appears not in acute distress. The patient is well groomed. Head: Normocephalic, atraumatic. Neck is supple. Mallampati 3  neck circumference:17. Nasal airflow patent. Prognathia is seen.  Cardiovascular:  Regular rate and rhythm, without  murmurs or carotid bruit, and without distended neck veins. Respiratory: Lungs are clear to auscultation. Skin:  Without evidence of edema, or rash Trunk: BMI is elevated . The patient's posture is erect.  Neurologic exam : The patient is awake and alert, oriented to place and time.   Attention span & concentration ability appears normal.  Speech is fluent,  without dysarthria, mild  dysphonia or aphasia.  Mood and affect are appropriate.  Cranial nerves: Pupils are equal and briskly reactive to light. Funduscopic exam without  evidence of pallor or edema.  Extraocular movements  in vertical and horizontal planes intact and without nystagmus. Visual fields by finger perimetry are intact. Hearing to finger rub intact.   Facial sensation intact to fine touch.  Facial motor strength is symmetric and tongue and uvula move midline.  Tongue tremor is noted. Shoulder shrug was symmetrical.   Motor exam:  Increased  Tone over biceps triceps and wrist, no cog-weeling. , muscle bulk and symmetric strength in all extremities.  Sensory:  Fine touch, pinprick  and vibration were tested in all extremities. Proprioception tested in the upper extremities was normal.  Coordination: Rapid alternating movements in the fingers/hands was normal. Finger-to-nose maneuver  normal without evidence of ataxia, dysmetria or tremor.  Gait and station: Patient walks without assistive device and is able unassisted to climb up to the exam table. Strength within normal limits.  Stance is stable and normal.  Deep tendon reflexes: in the upper and lower extremities are symmetric and intact. Babinski maneuver response is  downgoing.  The patient was advised of the nature of the diagnosed sleep disorder , the treatment options and risks for general a health and wellness arising from not treating the condition.  I spent more than 45 minutes of face to face time with the patient. Greater than 50% of time was spent in counseling and coordination of care. We have discussed the diagnosis and differential and I answered the patient's questions.     Assessment:  After physical and neurologic examination, review of laboratory studies,  Personal review of imaging studies, reports of other /same  Imaging studies ,  Results of polysomnography/ neurophysiology testing and pre-existing records as far as provided in visit., my assessment is   1) in context with his last sleep study from 31 months ago, Mr. Koza clearly has apnea that was strongly positional dependent. He felt subjectively that he slept better the second half of the study while on CPAP. For this reason I would like to reevaluate him and possibly put him on CPAP. He is a very restless sleeper and will not remain in supine or lateral position.  His second sleep disorders REM behavior disorder and it was clearly evident during his first sleep study that he had a lot of limb movements occurring while in rem sleep. Now he has begun to act out  his dreams sometimes his limbs literally fly, other times he may yell out. He does not  leaves the bed but he has fallen out of bed. His wife has moved to a separate bed in the same room. This degree of activity is usually first treated with melatonin and if that is insufficient Klonopin is added. 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 I would also like to add that he does not have parkinsonian symptoms or restrictive eye movements at this time.  He has no tendency to fall, he does not have dysautonomia signs, he has lifelong been hoarse. He does not have swallowing difficulties.   Plan:  Treatment plan and additional workup :  SPLIT study and Melatonin,  3 mg at night, po. Hopefully will reduce his Nocturia ans well as parasomnia.  Plan B is Klonopin.      Glenn Partridge Garnetta Fedrick MD  04/17/2016   CC: Unk Pinto, [address] [place], [place] [id number]

## 2016-04-20 ENCOUNTER — Other Ambulatory Visit: Payer: Self-pay | Admitting: Physician Assistant

## 2016-04-23 MED FILL — LISINOPRIL 10 MG TABLET: 10 | 90 days supply | Qty: 90 | Fill #0

## 2016-04-23 MED FILL — INVOKAMET XR 150-1,000 MG T: 150-1000 | 60 days supply | Qty: 120 | Fill #1

## 2016-04-27 MED FILL — CITALOPRAM HBR 20 MG TABLET: 20 | 90 days supply | Qty: 90 | Fill #1

## 2016-05-16 ENCOUNTER — Ambulatory Visit (INDEPENDENT_AMBULATORY_CARE_PROVIDER_SITE_OTHER): Payer: 59 | Admitting: Neurology

## 2016-05-16 DIAGNOSIS — F518 Other sleep disorders not due to a substance or known physiological condition: Secondary | ICD-10-CM

## 2016-05-16 DIAGNOSIS — G4733 Obstructive sleep apnea (adult) (pediatric): Secondary | ICD-10-CM

## 2016-05-16 DIAGNOSIS — G4761 Periodic limb movement disorder: Secondary | ICD-10-CM

## 2016-05-25 NOTE — Procedures (Signed)
PATIENT'S NAME:  Glenn Stephens, Glenn Stephens DOB:      1955/09/15      MR#:    [id number]     DATE OF RECORDING: 05/16/2016 REFERRING M.D.:  Unk Pinto, MD Study Performed:   Baseline Polysomnogram HISTORY:   Tracey Harries. Alvarado is a 60 y.o. male , seen for a sleep related complaint. "My wife complaints of my legs moving all night". 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  He underwent a sleep study 3 years ago, was diagnosed with AHI of 20.5 , supine AHI 45.5 , and PLM arousals - he was titrated to 7 cm water pressure but CPAP was not covered.    The patient endorsed the Epworth Sleepiness Scale at 7 points. FSS 17.  The patient's weight 243 pounds with a height of 72 (inches), resulting in a BMI of 32.8 kg/m2.The patient's neck circumference measured 17 inches.  CURRENT MEDICATIONS: Aspirin, Invokamet, Celexa, Lofibra, Lisinopril, Prilosec   PROCEDURE:  This is a multichannel digital polysomnogram utilizing the Somnostar 11.2 system.  Electrodes and sensors were applied and monitored per AASM Specifications.   EEG, EOG, Chin and Limb EMG, were sampled at 200 Hz.  ECG, Snore and Nasal Pressure, Thermal Airflow, Respiratory Effort, CPAP Flow and Pressure, Oximetry was sampled at 50 Hz. Digital video and audio were recorded.      BASELINE STUDY; Lights Out was at 22:41 and Lights On at 05:11.  Total recording time (TRT) was 390.5 minutes, with a total sleep time (TST) of  216 minutes.   The patient's sleep latency was 96.5 minutes.  REM latency was 0 minutes.  The sleep efficiency was 55.3 %.     SLEEP ARCHITECTURE: WASO (Wake after sleep onset) was 146 minutes.  There were 44 minutes in Stage N1, 42.5 minutes Stage N2, 129.5 minutes Stage N3 and 0 minutes in Stage REM.  The percentage of Stage N1 was 20.4%, Stage N2 was 19.7%, Stage N3 was 60.% and Stage R (REM sleep) was 0%.   RESPIRATORY  ANALYSIS:  There were a total of only 1 respiratory event:  apnea index (AI) of 0 /hour. There was 1 hypopnea with a hypopnea index of 0 .3 /hour.  The patient also had 0 respiratory event related arousals (RERAs).   The total APNEA/HYPOPNEA INDEX (AHI) was 0 .3/hour and the total RESPIRATORY DISTURBANCE INDEX was 0.3 /hour.  0 events occurred in REM sleep and 2 events in NREM. The REM AHI was 0 /hour, versus a non-REM AHI of 0.3. The patient spent 123 minutes of total sleep time in the supine position and 93 minutes in non-supine. The supine AHI was 0.5 versus a non-supine AHI of 0.0.  OXYGEN SATURATION & C02:  The Wake baseline 02 saturation was 96%, with the lowest being 91%. Time spent below 89% saturation equaled 0 minutes.   PERIODIC LIMB MOVEMENTS:   The patient had a total of 302 Periodic Limb Movements.  The Periodic Limb Movement (PLM) index was 83.9 and the PLM Arousal index was 11.1/hour.    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  REM void sleep architecture. No apnea was found.  Heart rate regular with few PVC.   RECOMMENDATIONS:  1.  Avoid caffeine-containing beverages and chocolate. 2. Initiate PLM work up, ruling out neuropathy, spinal stenosis, myoclonus. Medication trial.  3.  Correlate clinically for a history consistent with regarding restless legs syndrome (RLS).    Consider secondary restless legs syndrome.  Pharmacotherapy may be warranted.  Obtain a serum ferritin level if the clinical history is consistent with RLS.  Consider iron therapy and evaluation for iron deficiency anemia if the serum ferritin level < 50 ng/mL.  Certain medications or substances may aggravate RLS and common offenders may include the following:  nicotine, caffeine, SSRIs, TCAs, phenothiazines, dopamine antagonists, diphenhydramine, and alcohol.      4. A follow up appointment will be scheduled in the Sleep  Clinic at [facility]. The referring provider will be notified of the results.      I certify that I have reviewed the entire raw data recording prior to the issuance of this report in accordance with the Standards of Accreditation of the American Academy of Sleep Medicine (AASM)     Larey Seat, MD  05-25-16 Diplomat, American Board of Psychiatry and Neurology  Diplomat, American Board of [place] Director, [place] [facility] [employer]

## 2016-05-29 ENCOUNTER — Telehealth: Payer: Self-pay

## 2016-05-29 NOTE — Telephone Encounter (Signed)
I called pt to discuss sleep study results. No answer, left a message asking pt to call me back. 

## 2016-05-30 NOTE — Telephone Encounter (Signed)
I spoke to pt. He says that he is very busy with a pt and asked me to call his wife Judeen Hammans and gave me her phone number.  I called pt's wife at [phone] and left a message asking her to call me back.

## 2016-05-30 NOTE — Telephone Encounter (Signed)
I spoke to pt's wife regarding pt's sleep study results (per DPR). 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 No apnea was found. I advised pt's wife that pt should avoid caffeine containing beverages and chocolate. Pt's wife is agreeable for a follow up for pt to discuss treatment. An appt was made for 06/25/16 at 9:00am. Pt's wife verbalized understanding of results. Pt had no questions at this time but was encouraged to call back if questions arise.

## 2016-05-30 NOTE — Telephone Encounter (Signed)
Pt's wife called about results.

## 2016-06-13 ENCOUNTER — Other Ambulatory Visit: Payer: Self-pay | Admitting: Physician Assistant

## 2016-06-13 DIAGNOSIS — Z85828 Personal history of other malignant neoplasm of skin: Secondary | ICD-10-CM | POA: Diagnosis not present

## 2016-06-13 DIAGNOSIS — D1801 Hemangioma of skin and subcutaneous tissue: Secondary | ICD-10-CM | POA: Diagnosis not present

## 2016-06-13 DIAGNOSIS — L821 Other seborrheic keratosis: Secondary | ICD-10-CM | POA: Diagnosis not present

## 2016-06-13 DIAGNOSIS — D485 Neoplasm of uncertain behavior of skin: Secondary | ICD-10-CM | POA: Diagnosis not present

## 2016-06-13 DIAGNOSIS — C4442 Squamous cell carcinoma of skin of scalp and neck: Secondary | ICD-10-CM | POA: Diagnosis not present

## 2016-06-13 DIAGNOSIS — L814 Other melanin hyperpigmentation: Secondary | ICD-10-CM | POA: Diagnosis not present

## 2016-06-13 DIAGNOSIS — L570 Actinic keratosis: Secondary | ICD-10-CM | POA: Diagnosis not present

## 2016-06-13 MED FILL — OMEPRAZOLE DR 20 MG CAPSULE: 20 | 90 days supply | Qty: 90 | Fill #0

## 2016-06-22 ENCOUNTER — Ambulatory Visit: Payer: Self-pay | Admitting: Physician Assistant

## 2016-06-22 NOTE — Progress Notes (Deleted)
Assessment and Plan:    Follow up 3 months Continue diet and meds as discussed.  Future Appointments Date Time Provider [place]  06/22/2016 9:00 AM Vicie Mutters, PA-C [facility] None  06/25/2016 9:00 AM Larey Seat, MD [facility] None  03/26/2017 2:00 PM Vicie Mutters, PA-C [facility] None     HPI 60 y.o. male  presents for 3 month follow up with hypertension, hyperlipidemia, prediabetes and vitamin D. His blood pressure is normally controlled at home,  today their BP is   He does workout. He denies chest pain, shortness of breath, dizziness.  He is not on cholesterol medication, he can not tolerate statins and denies myalgias. His cholesterol is not at goal. 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 Last A1C in the office was:  Lab Results  Component Value Date   HGBA1C 6.3 (H) 03/27/2016   Lab Results  Component Value Date   GFRNONAA 57 (L) 03/27/2016   Patient is on Vitamin D supplement.   Lab Results  Component Value Date   VD25OH 26 (L) 03/27/2016     OSA not on CPAP.  BMI is There is no height or weight on file to calculate BMI., he is working on diet and exercise and has done well.  Wt Readings from Last 3 Encounters:  04/17/16 243 lb (110.2 kg)  03/27/16 239 lb 12.8 oz (108.8 kg)  12/21/15 243 lb (110.2 kg)   Current Medications:  Current Outpatient Prescriptions on File Prior to Visit  Medication Sig Dispense Refill  . aspirin 81 MG tablet Take 81 mg by mouth daily.    . Canagliflozin-Metformin HCl ER (INVOKAMET XR)  [phone] MG TB24 Take 300 mg by mouth every morning. Take 2 pills every morning for your sugar (Patient taking differently: Take 300 mg by mouth every morning. Take 1 pills every morning for your sugar) 60 tablet 2  . citalopram (CELEXA) 20 MG tablet TAKE 1 TABLET BY MOUTH EVERY DAY 90 tablet 1  . clonazePAM (KLONOPIN) 0.5 MG tablet Take 0.5 tablets (0.25 mg total) by mouth at bedtime. 30 tablet 1  . fenofibrate micronized (LOFIBRA) 134 MG capsule Take 1 capsule (134 mg total) by mouth daily before breakfast. 30 capsule 1  . lisinopril (PRINIVIL,ZESTRIL) 10 MG tablet TAKE 1 TABLET BY MOUTH DAILY. 90 tablet 1  . Melatonin 3 MG TBDP Take 3 mg by mouth Nightly. 30 tablet 0  . omeprazole (PRILOSEC) 20 MG capsule TAKE 1 CAPSULE BY MOUTH ONCE DAILY 90 capsule 1  . Thiamine Mononitrate (VITAMIN B1 PO) Take by mouth.    . TURMERIC PO Take by mouth.     No current facility-administered medications on file prior to visit.    Medical History:  Past Medical History:  Diagnosis Date  . Cancer ([place])   . Diabetes mellitus without complication ([place])   . GERD (gastroesophageal reflux disease)   . Hyperlipidemia   . Hypertension   . OSA on CPAP 08/03/2013  . Unspecified vitamin D deficiency  Allergies:  Allergies  Allergen Reactions  . Caduet [Amlodipine-Atorvastatin]   . Statins     BLURRED VISION    Review of Systems  Constitutional: Negative.   HENT: Negative for congestion, ear discharge, ear pain, hearing loss, nosebleeds, sore throat and tinnitus.   Eyes: Negative.   Respiratory: Negative.  Negative for cough, shortness of breath and stridor.   Cardiovascular: Positive for leg swelling. Negative for chest pain, palpitations, orthopnea, claudication and PND.  Gastrointestinal: Negative.   Genitourinary: Negative.  Negative for frequency and urgency.  Musculoskeletal: Positive for back pain and joint pain. Negative for falls, myalgias and neck pain.  Skin: Negative.   Neurological:  Negative.  Negative for dizziness and headaches.  Psychiatric/Behavioral: Negative for depression, hallucinations, memory loss, substance abuse and suicidal ideas. The patient has insomnia. The patient is not nervous/anxious.      Family history- Review and unchanged Social history- Review and unchanged Physical Exam: There were no vitals taken for this visit. Wt Readings from Last 3 Encounters:  04/17/16 243 lb (110.2 kg)  03/27/16 239 lb 12.8 oz (108.8 kg)  12/21/15 243 lb (110.2 kg)   General Appearance: Well nourished, in no apparent distress. Eyes: PERRLA, EOMs, conjunctiva no swelling or erythema Sinuses: No Frontal/maxillary tenderness ENT/Mouth: Ext aud canals clear, TMs without erythema, bulging. No erythema, swelling, or exudate on post pharynx.  Tonsils not swollen or erythematous. Hearing normal.  Neck: Supple, thyroid normal.  Respiratory: Respiratory effort normal, BS equal bilaterally without rales, rhonchi, wheezing or stridor.  Cardio: RRR with no MRGs. Brisk peripheral pulses without edema.  Abdomen: Soft, + BS.  Non tender, no guarding, rebound, hernias, masses. Lymphatics: Non tender without lymphadenopathy.  Musculoskeletal: Full ROM, 5/5 strength, normal gait.  Skin: Warm, dry without rashes, lesions, ecchymosis.  Neuro: Cranial nerves intact. Normal muscle tone, no cerebellar symptoms. Sensation intact.  Psych: Awake and oriented X 3, normal affect, Insight and Judgment appropriate.    Vicie Mutters 8:02 AM

## 2016-06-25 ENCOUNTER — Encounter: Payer: Self-pay | Admitting: Neurology

## 2016-06-25 ENCOUNTER — Ambulatory Visit (INDEPENDENT_AMBULATORY_CARE_PROVIDER_SITE_OTHER): Payer: 59 | Admitting: Neurology

## 2016-06-25 VITALS — BP 132/68 | HR 88 | Resp 16 | Ht 72.0 in | Wt 243.0 lb

## 2016-06-25 DIAGNOSIS — G2581 Restless legs syndrome: Secondary | ICD-10-CM

## 2016-06-25 DIAGNOSIS — G4761 Periodic limb movement disorder: Secondary | ICD-10-CM | POA: Diagnosis not present

## 2016-06-25 DIAGNOSIS — D508 Other iron deficiency anemias: Secondary | ICD-10-CM | POA: Diagnosis not present

## 2016-06-25 DIAGNOSIS — F515 Nightmare disorder: Secondary | ICD-10-CM

## 2016-06-25 DIAGNOSIS — Z8659 Personal history of other mental and behavioral disorders: Secondary | ICD-10-CM | POA: Diagnosis not present

## 2016-06-25 MED ORDER — ROPINIROLE HCL 0.25 MG PO TABS: 0.2500 mg | ORAL_TABLET | Freq: Every day | ORAL | 3 refills | Status: DC

## 2016-06-25 NOTE — Progress Notes (Signed)
S[facility]EP MED[facility]INE CLINIC   Provider:  Larey Seat, M D  Referring Provider: Unk Pinto, MD Primary Care Physician:  Alesia Richards, MD  Chief Complaint  Patient presents with  . Follow-up    Rm 11. Patient is here to discuss his sleep study.     HPI:  Glenn Stephens is a 60 y.o. male , seen here as a referral  from Dr. Melford Aase for a sleep consultation, Chief complaint according to patient : My wife complaints of my legs moving all night.   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  He  is seen here today not as a new patient but following up from a sleep study that had been performed almost 3 years ago on 07/22/2013, at the time he had complained about excessive daytime sleepiness and endorsed the Epworth score at 16 out of 24 points.  He reached an AHI of 20 5. 6 which would place him into the moderate category of apnea he did not have REM sleep, and he slept 56.3% in supine position, all apneas occurred while on his back. Supine AHI was 45.5 and nonsupine 0.0 also had frequent periodic limb movements but infrequent arousals 2.2 times per hour of sleep, he remains is a regular sinus rhythm heartbeat and isolated ectopic beats the patient was titrated with a night beginning at 5 cm water pressure on CPAP and advanced to 7 cm water but he had more periodic limb movements after CPAP was applied. His PLM arousal index rose to 8.4. He was prescribed a CPAP at 7 cm water with 2 cm flex but he remained a very restless sleeper at home. Due to an issue with insurance a CPAP was never issued.  His wife reports that he has active nightmares that he seems to reenact, he falls out of the bed. He is a very restless sleeper. He thrashes and kicks. Some of the dreams he may remember others not. After he fell out of the bed he actually continue to sleep on the floor. His nightmares often have a threatening  content he feels that he is attacked by a large animal like a bear, but he has to run or defend himself. These very active periods of sleep last about 2 minutes and he usually does not leaves the bed, but stays in bed. This has the hallmarks of REM sleep behavior disorder. He reports no memory difficulties.   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 Since he works still a late shift he often doesn't get home before 11:00 and bedtime may be as late as 1 AM. Once in bed he usually falls asleep, his wife insists that this is prompt . He snacks late at night.  Tendency to fall asleep on the couch watching TV before he goes to the bedroom. Once asleep he usually stays asleep that he seems to be very active in his sleep. The bedroom is cool, quiet and dark. He shares a bedroom with his wife  ( she sleeps in another bed in the same room ) and she reports having been kicked or hit inadvertently at night. He feels not relaxed - he is always "toned' . He moves rapidly and frequently.  He may have 2-3 bathroom breaks each night, can get back  to sleep easily. He rises at 8- 9 AM , depending on the late shift the day before. He requires only 6 hours of sleep to feel rested and restored.  He take 3 cups of coffee in AM, after he wakes (without headaches, no palpitations no diaphoresis).    in context with his last sleep study from 31 months ago, Glenn Stephens clearly has apnea that was strongly positional dependent. He felt subjectively that he slept better the second half of the study while on CPAP. For this reason I would like to reevaluate him and possibly put him on CPAP. He is a very restless sleeper and will not remain in supine or lateral position.  His second sleep disorders REM behavior disorder and it was clearly evident during his first sleep study that he had a lot of limb movements occurring while  in rem sleep. Now he has begun to act out his dreams sometimes his limbs literally fly, other times he may yell out. He does not leaves the bed but he has fallen out of bed. His wife has moved to a separate bed in the same room. This degree of activity is usually first treated with melatonin and if that is insufficient Klonopin is added. 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 I would also like to add that he does not have parkinsonian symptoms or restrictive eye movements at this time.  He has no tendency to fall, he does not have dysautonomia signs, he has lifelong been hoarse. He does not have swallowing difficulties. He has been questioning if he has PTSD, as he seen gruesome cases in his 7 year career.  Sleep medical history and family sleep history: night shift worker.  Father had severe tremor, not PD.  Social history: married , retired Arts administrator, Designer, fashion/clothing now.   Interval history from 06/25/2016 Glenn Stephens underwent a sleep study on 05/16/2016. He did not have any significant apnea. He slept only 260 minutes of a total recording time of 390 minutes. Definitely had difficulties to maintain sleep he frequently moved his limbs, and PLM arousals broke or aroused him out of deeper sleep stages 11 times per hour of sleep average. His wife reports that he always scratches rubs or massages his legs, also he seems not to be aware of. A medication trial is indicated. I would also like to measure his serum ferritin level. 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 I will check for iron deficiency anemia and will ask him to avoid caffeine containing beverages and chocolate as they produce PLMS. His vivid dreams and hollering are witnessed by his wife. , he kicks and thrashes. Melatonin can help and his wife has purchased some.    Review of Systems: Out of a complete 14 system review, the patient complains of  only the following symptoms, and all other reviewed systems are negative.  Epworth score 7 , Fatigue severity score 17  , depression score 2/15    Social History   Social History  . Marital status: Married    Spouse name: N/A  . Number of children: N/A  . Years of education: N/A   Occupational History  . Not on file.   Social History Main Topics  . Smoking status: Former Smoker    Quit date: 06/24/1993  . Smokeless tobacco: Not on file  . Alcohol use No  . Drug use: No  .  Sexual activity: Not on file   Other Topics Concern  . Not on file   Social History Narrative  . No narrative on file    Family History  Problem Relation Age of Onset  . Cancer Mother     melanoma with mets   . Heart disease Father   . Hyperlipidemia Father   . Diabetes Sister   . Hypertension Brother     Past Medical History:  Diagnosis Date  . Cancer ([place])   . Diabetes mellitus without complication ([place])   . GERD (gastroesophageal reflux disease)   . Hyperlipidemia   . Hypertension   . OSA on CPAP 08/03/2013  . Unspecified vitamin D deficiency     Past Surgical History:  Procedure Laterality Date  . LASER ABLATION     veins  . orthopedic surgeries     knee,foot    Current Outpatient Prescriptions  Medication Sig Dispense Refill  . aspirin 81 MG tablet Take 81 mg by mouth daily.    . Canagliflozin-Metformin HCl ER (INVOKAMET XR) [phone] MG TB24 Take 300 mg by mouth every morning. Take 2 pills every morning for your sugar (Patient taking differently: Take 300 mg by mouth every morning. Take 1 pills every morning for your sugar) 60 tablet 2  . citalopram (CELEXA) 20 MG tablet TAKE 1 TABLET BY MOUTH EVERY DAY 90 tablet 1  . lisinopril (PRINIVIL,ZESTRIL) 10 MG tablet TAKE 1 TABLET BY MOUTH DAILY. 90 tablet 1  . omeprazole (PRILOSEC) 20 MG capsule TAKE 1 CAPSULE BY MOUTH ONCE DAILY 90 capsule 1  . TURMERIC PO Take by mouth.    . clonazePAM (KLONOPIN) 0.5 MG tablet Take 0.5 tablets  (0.25 mg total) by mouth at bedtime. (Patient not taking: Reported on 06/25/2016) 30 tablet 1  . fenofibrate micronized (LOFIBRA) 134 MG capsule Take 1 capsule (134 mg total) by mouth daily before breakfast. (Patient not taking: Reported on 06/25/2016) 30 capsule 1  . Melatonin 3 MG TBDP Take 3 mg by mouth Nightly. (Patient not taking: Reported on 06/25/2016) 30 tablet 0   No current facility-administered medications for this visit.     Allergies as of 06/25/2016 - Review Complete 06/25/2016  Allergen Reaction Noted  . Caduet [amlodipine-atorvastatin]  02/17/2013  . Statins  06/02/2013    Vitals: BP 132/68   Pulse 88   Resp 16   Ht 6' (1.829 m)   Wt 243 lb (110.2 kg)   BMI 32.96 kg/m  Last Weight:  Wt Readings from Last 1 Encounters:  06/25/16 243 lb (110.2 kg)   [id number] mass index is 32.96 kg/m.     Last Height:   Ht Readings from Last 1 Encounters:  06/25/16 6' (1.829 m)    Physical exam:  General: The patient is awake, alert and appears not in acute distress. The patient is well groomed. Head: Normocephalic, atraumatic. Neck is supple. Mallampati 3  neck circumference:17. Nasal airflow patent. Prognathia is seen.  Cardiovascular:  Regular rate and rhythm, without  murmurs or carotid bruit, and without distended neck veins. Respiratory: Lungs are clear to auscultation. Skin:  Without evidence of edema, or rash Trunk: BMI is elevated . The patient's posture is erect.  Neurologic exam : The patient is awake and alert, oriented to place and time.   Attention span & concentration ability appears normal.  Speech is fluent,  without dysarthria, mild  dysphonia or aphasia.  Mood and affect are appropriate.  Cranial nerves: Pupils are equal and briskly reactive to light.  Funduscopic exam without  evidence of pallor or edema.  Extraocular movements  in vertical and horizontal planes intact and without nystagmus. Visual fields by finger perimetry are intact. Hearing to  finger rub intact. Tongue tremor is noted. Shoulder shrug was symmetrical.   Motor exam:  Increased tone over biceps triceps and wrist, no cog-weeling. , muscle bulk and symmetric strength in all extremities. Sensory:  Fine touch, pinprick and vibration were tested in all extremities. Proprioception tested in the upper extremities was normal. Coordination: Rapid alternating movements in the fingers/hands was normal. Finger-to-nose maneuver  normal without evidence of ataxia, dysmetria or tremor. Gait and station: Patient walks without assistive device   Stance is stable and normal.  Deep tendon reflexes: in the upper and lower extremities are symmetric and intact. Babinski maneuver response is downgoing.  The patient was advised of the nature of the diagnosed sleep disorder , the treatment options and risks for general a health and wellness arising from not treating the condition.  I spent more than 25 minutes of face to face time with the patient. Greater than 50% of time was spent in counseling and coordination of care. We have discussed the diagnosis and differential and I answered the patient's questions.     Assessment:  After physical and neurologic examination, review of laboratory studies,  Personal review of imaging studies, reports of other /same  Imaging studies ,  Results of polysomnography/ neurophysiology testing and pre-existing records as far as provided in visit., my assessment is   1)  Mr. Urgiles suffered from frequent periodic limb movements during the night of the study, fragmenting his sleep but he also had primary insomnia. 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 REM behavior disorder is suspected, but the patient may have a mild form of PTSD. I would like for him to undergo iron deficiency anemia check today, I will write for  low-dose of ropinirole, I also asked him to take melatonin at nighttime ropinirole would only be used for the night and not an daytime as it can produce daytime sleepiness. Klonopin is usually prescribed for patients with REM behavior disorder if melatonin does not work.  Plan:  Treatment plan and additional workup :  Ferritin , Iron TIBC,  [place] order Plan B Klonopin/     Asencion Partridge Altheia Shafran MD  06/25/2016   CC: Unk Pinto, [place], [place] [id number]

## 2016-06-25 NOTE — Patient Instructions (Signed)
Restless Legs Syndrome Introduction Restless legs syndrome is a condition that causes uncomfortable feelings or sensations in the legs, especially while sitting or lying down. The sensations usually cause an overwhelming urge to move the legs. The arms can also sometimes be affected. The condition can range from mild to severe. The symptoms often interfere with a person's ability to sleep. What are the causes? The cause of this condition is not known. What increases the risk? This condition is more likely to develop in:  People who are older than age 69.  Pregnant women. In general, restless legs syndrome is more common in women than in men.  People who have a family history of the condition.  People who have certain medical conditions, such as iron deficiency, kidney disease, Parkinson disease, or nerve damage.  People who take certain medicines, such as medicines for high blood pressure, nausea, colds, allergies, depression, and some heart conditions. What are the signs or symptoms? The main symptom of this condition is uncomfortable sensations in the legs. These sensations may be:  Described as pulling, tingling, prickling, throbbing, crawling, or burning.  Worse while you are sitting or lying down.  Worse during periods of rest or inactivity.  Worse at night, often interfering with your sleep.  Accompanied by a very strong urge to move your legs.  Temporarily relieved by movement of your legs. The sensations usually affect both sides of the body. The arms can also be affected, but this is rare. People who have this condition often have tiredness during the day because of their lack of sleep at night. How is this diagnosed? This condition may be diagnosed based on your description of the symptoms. You may also have tests, including blood tests, to check for other conditions that may lead to your symptoms. In some cases, you may be asked to spend some time in a sleep lab so your  sleeping can be monitored. How is this treated? Treatment for this condition is focused on managing the symptoms. Treatment may include:  Self-help and lifestyle changes.  Medicines. Follow these instructions at home:  Take medicines only as directed by your health care provider.  Try these methods to get temporary relief from the uncomfortable sensations:  Massage your legs.  Walk or stretch.  Take a cold or hot bath.  Practice good sleep habits. For example, go to bed and get up at the same time every day.  Exercise regularly.  Practice ways of relaxing, such as yoga or meditation.  Avoid caffeine and alcohol.  Do not use any tobacco products, including cigarettes, chewing tobacco, or electronic cigarettes. If you need help quitting, ask your health care provider.  Keep all follow-up visits as directed by your health care provider. This is important. Contact a health care provider if: Your symptoms do not improve with treatment, or they get worse. This information is not intended to replace advice given to you by your health care provider. Make sure you discuss any questions you have with your health care provider. Document Released: 06/15/2002 Document Revised: 12/01/2015 Document Reviewed: 06/21/2014  2017 Elsevier

## 2016-06-26 ENCOUNTER — Ambulatory Visit: Payer: Self-pay | Admitting: Physician Assistant

## 2016-06-26 LAB — IRON AND TIBC
Iron Saturation: 27 % (ref 15–55)
Iron: 83 ug/dL (ref 38–169)
TIBC: 309 ug/dL (ref 250–450)
UIBC: 226 ug/dL (ref 111–343)

## 2016-06-26 LAB — FERRITIN: Ferritin: 121 ng/mL (ref 30–400)

## 2016-06-27 ENCOUNTER — Telehealth: Payer: Self-pay

## 2016-06-27 NOTE — Telephone Encounter (Signed)
I spoke to patient and he is aware of results and recommendations.  

## 2016-06-27 NOTE — Telephone Encounter (Signed)
-----   Message from Larey Seat, MD sent at 06/26/2016  4:57 PM EST ----- All normal levels, iron, ferritin. Etc.

## 2016-07-31 DIAGNOSIS — D044 Carcinoma in situ of skin of scalp and neck: Secondary | ICD-10-CM | POA: Diagnosis not present

## 2016-08-01 ENCOUNTER — Ambulatory Visit (INDEPENDENT_AMBULATORY_CARE_PROVIDER_SITE_OTHER): Payer: 59 | Admitting: Physician Assistant

## 2016-08-01 ENCOUNTER — Encounter: Payer: Self-pay | Admitting: Physician Assistant

## 2016-08-01 VITALS — BP 136/80 | HR 80 | Temp 98.2°F | Resp 16 | Ht 72.0 in | Wt 244.0 lb

## 2016-08-01 DIAGNOSIS — E785 Hyperlipidemia, unspecified: Secondary | ICD-10-CM | POA: Diagnosis not present

## 2016-08-01 DIAGNOSIS — E1122 Type 2 diabetes mellitus with diabetic chronic kidney disease: Secondary | ICD-10-CM

## 2016-08-01 DIAGNOSIS — Z79899 Other long term (current) drug therapy: Secondary | ICD-10-CM | POA: Diagnosis not present

## 2016-08-01 DIAGNOSIS — E559 Vitamin D deficiency, unspecified: Secondary | ICD-10-CM

## 2016-08-01 DIAGNOSIS — N183 Chronic kidney disease, stage 3 (moderate): Secondary | ICD-10-CM

## 2016-08-01 DIAGNOSIS — I10 Essential (primary) hypertension: Secondary | ICD-10-CM | POA: Diagnosis not present

## 2016-08-01 LAB — CBC WITH DIFFERENTIAL/PLATELET
Basophils Absolute: 0 cells/uL (ref 0–200)
Basophils Relative: 0 %
EOS PCT: 2 %
Eosinophils Absolute: 174 cells/uL (ref 15–500)
HCT: 41.9 % (ref 38.5–50.0)
Hemoglobin: 14.4 g/dL (ref 13.2–17.1)
LYMPHS PCT: 30 %
Lymphs Abs: 2610 cells/uL (ref 850–3900)
MCH: 29 pg (ref 27.0–33.0)
MCHC: 34.4 g/dL (ref 32.0–36.0)
MCV: 84.3 fL (ref 80.0–100.0)
MPV: 9.3 fL (ref 7.5–12.5)
Monocytes Absolute: 522 cells/uL (ref 200–950)
Monocytes Relative: 6 %
NEUTROS PCT: 62 %
Neutro Abs: 5394 cells/uL (ref 1500–7800)
PLATELETS: 224 10*3/uL (ref 140–400)
RBC: 4.97 MIL/uL (ref 4.20–5.80)
RDW: 13.5 % (ref 11.0–15.0)
WBC: 8.7 10*3/uL (ref 3.8–10.8)

## 2016-08-01 LAB — TSH: TSH: 1.68 m[IU]/L (ref 0.40–4.50)

## 2016-08-01 MED ORDER — VALSARTAN 80 MG PO TABS
80.0000 mg | ORAL_TABLET | Freq: Every day | ORAL | 3 refills | Status: DC
Start: 2016-08-01 — End: 2016-11-02

## 2016-08-01 MED ORDER — CITALOPRAM HYDROBROMIDE 40 MG PO TABS: 40.0000 mg | ORAL_TABLET | Freq: Every day | ORAL | 1 refills | Status: DC

## 2016-08-01 MED FILL — VALSARTAN 80 MG TABLET: 80 | 30 days supply | Qty: 30 | Fill #0

## 2016-08-01 MED FILL — CITALOPRAM HBR 40 MG TABLET: 40 | 90 days supply | Qty: 90 | Fill #0

## 2016-08-01 NOTE — Progress Notes (Signed)
Assessment and Plan:  1. Essential hypertension - continue medications, DASH diet, exercise and monitor at home. Call if greater than 130/80.  Switch ACE to ARB, monitor BP - valsartan (DIOVAN) 80 MG tablet; Take 1 tablet (80 mg total) by mouth daily.  Dispense: 30 tablet; Refill: 3 - CBC with Differential/Platelet - BASIC METABOLIC PANEL WITH GFR - TSH - Hepatic function panel  2. Type 2 diabetes mellitus with stage 3 chronic kidney disease, without long-term current use of insulin ([place]) Discussed general issues about diabetes pathophysiology and management., Educational material distributed., Suggested low cholesterol diet., Encouraged aerobic exercise., Discussed foot care., Reminded to get yearly retinal exam. - Hemoglobin A1c  3. Hyperlipidemia, unspecified hyperlipidemia type -continue medications, check lipids, decrease fatty foods, increase activity.  - Lipid panel  4. Morbid obesity, unspecified obesity type ([place]) - long discussion about weight loss, diet, and exercise  5. Vitamin D deficiency - VITAMIN D 25 Hydroxy (Vit-D Deficiency, Fractures)  6. Medication management - Magnesium   Follow up 3 months Continue diet and meds as discussed.  Future Appointments Date Time Provider [place]  03/26/2017 2:00 PM Vicie Mutters, PA-C [facility] None     HPI 61 y.o. male  presents for 3 month follow up with hypertension, hyperlipidemia, prediabetes and vitamin D. His blood pressure is normally controlled at home, he is on ACE, has had worsening cough, will switch,  today their BP is BP: 136/80 He does workout. He denies chest pain, shortness of breath, dizziness.  He is not on cholesterol medication, he can not tolerate statins and denies myalgias. His cholesterol is not at goal. The cholesterol last visit was:   Lab Results  Component Value Date   CHOL 245 (H) 03/27/2016   HDL 38 (L) 03/27/2016   LDLCALC 137 (H) 03/27/2016   TRIG 352 (H) 03/27/2016   CHOLHDL  6.4 (H) 03/27/2016   He has been working on diet and exercise for diabetes with CKD, he is on bASA, he is on ACE, not checking sugars at home, went down to 1 invokamet a day and has increased water, wife Judeen Hammans is here and states that he has not been eating well, and denies paresthesia of the feet, polydipsia, polyuria and visual disturbances. Last A1C in the office was:  Lab Results  Component Value Date   HGBA1C 6.3 (H) 03/27/2016   Lab Results  Component Value Date   GFRNONAA 57 (L) 03/27/2016   Patient is on Vitamin D supplement.   Lab Results  Component Value Date   VD25OH 26 (L) 03/27/2016     He is not on CPAP, had negative sleep study, has REM behavior disorder and RLS, on melatonin and has not started requip.  BMI is Body mass index is 33.09 kg/m., he is working on diet and exercise and has done well.  Wt Readings from Last 3 Encounters:  08/01/16 244 lb (110.7 kg)  06/25/16 243 lb (110.2 kg)  04/17/16 243 lb (110.2 kg)   Current Medications:  Current Outpatient Prescriptions on File Prior to Visit  Medication Sig Dispense Refill  . aspirin 81 MG tablet Take 81 mg by mouth daily.    . Canagliflozin-Metformin HCl ER (INVOKAMET XR) [phone] MG TB24 Take 300 mg by mouth every morning. Take 2 pills every morning for your sugar (Patient taking differently: Take 300 mg by mouth every morning. Take 1 pills every morning for your sugar) 60 tablet 2  . citalopram (CELEXA) 20 MG tablet TAKE 1 TABLET BY MOUTH  EVERY DAY 90 tablet 1  . fenofibrate micronized (LOFIBRA) 134 MG capsule Take 1 capsule (134 mg total) by mouth daily before breakfast. 30 capsule 1  . lisinopril (PRINIVIL,ZESTRIL) 10 MG tablet TAKE 1 TABLET BY MOUTH DAILY. 90 tablet 1  . Melatonin 3 MG TBDP Take 3 mg by mouth Nightly. 30 tablet 0  . omeprazole (PRILOSEC) 20 MG capsule TAKE 1 CAPSULE BY MOUTH ONCE DAILY 90 capsule 1  . rOPINIRole (REQUIP) 0.25 MG tablet Take 1 tablet (0.25 mg total) by mouth at bedtime. 30  tablet 3  . TURMERIC PO Take by mouth.     No current facility-administered medications on file prior to visit.    Medical History:  Past Medical History:  Diagnosis Date  . Cancer ([place])   . Diabetes mellitus without complication ([place])   . GERD (gastroesophageal reflux disease)   . Hyperlipidemia   . Hypertension   . OSA on CPAP 08/03/2013  . Unspecified vitamin D deficiency    Allergies:  Allergies  Allergen Reactions  . Caduet [Amlodipine-Atorvastatin]   . Statins     BLURRED VISION    Review of Systems  Constitutional: Negative.   HENT: Negative for congestion, ear discharge, ear pain, hearing loss, nosebleeds, sore throat and tinnitus.   Eyes: Negative.   Respiratory: Negative.  Negative for cough, shortness of breath and stridor.   Cardiovascular: Positive for leg swelling. Negative for chest pain, palpitations, orthopnea, claudication and PND.  Gastrointestinal: Negative.   Genitourinary: Negative.  Negative for frequency and urgency.  Musculoskeletal: Positive for back pain and joint pain. Negative for falls, myalgias and neck pain.  Skin: Negative.   Neurological: Negative.  Negative for dizziness and headaches.  Psychiatric/Behavioral: Negative for depression, hallucinations, memory loss, substance abuse and suicidal ideas. The patient has insomnia. The patient is not nervous/anxious.     Family history- Review and unchanged Social history- Review and unchanged Physical Exam: BP 136/80   Pulse 80   Temp 98.2 F (36.8 C)   Resp 16   Ht 6' (1.829 m)   Wt 244 lb (110.7 kg)   SpO2 97%   BMI 33.09 kg/m  Wt Readings from Last 3 Encounters:  08/01/16 244 lb (110.7 kg)  06/25/16 243 lb (110.2 kg)  04/17/16 243 lb (110.2 kg)   General Appearance: Well nourished, in no apparent distress. Eyes: PERRLA, EOMs, conjunctiva no swelling or erythema Sinuses: No Frontal/maxillary tenderness ENT/Mouth: Ext aud canals clear, TMs without erythema, bulging. No erythema,  swelling, or exudate on post pharynx.  Tonsils not swollen or erythematous. Hearing normal.  Neck: Supple, thyroid normal.  Respiratory: Respiratory effort normal, BS equal bilaterally without rales, rhonchi, wheezing or stridor.  Cardio: RRR with no MRGs. Brisk peripheral pulses without edema.  Abdomen: Soft, + BS.  Non tender, no guarding, rebound, hernias, masses. Lymphatics: Non tender without lymphadenopathy.  Musculoskeletal: Full ROM, 5/5 strength, normal gait.  Skin: Warm, dry without rashes, lesions, ecchymosis.  Neuro: Cranial nerves intact. Normal muscle tone, no cerebellar symptoms. Sensation intact.  Psych: Awake and oriented X 3, normal affect, Insight and Judgment appropriate.    Vicie Mutters 3:50 PM

## 2016-08-01 NOTE — Patient Instructions (Signed)
Do the celexa 40mg  for a while, can cut in half in the spring Can stop lisinopril and switch to diovan Start 1/2 of the diovan but if you have dizziness/low blood pressure below 110/60, stop the diovan Increase fluids   Simple math prevails.    1st - exercise does not produce significant weight loss - at best one converts fat into muscle , "bulks up", loses inches, but usually stays "weight neutral"     2nd - think of your body weightas a check book: If you eat more calories than you burn up - you save money or gain weight .... Or if you spend more money than you put in the check book, ie burn up more calories than you eat, then you lose weight     3rd - if you walk or run 1 mile, you burn up 100 calories - you have to burn up 3,500 calories to lose 1 pound, ie you have to walk/run 35 miles to lose 1 measly pound. So if you want to lose 10 #, then you have to walk/run 350 miles, so.... clearly exercise is not the solution.     4. So if you consume 1,500 calories, then you have to burn up the equivalent of 15 miles to stay weight neutral - It also stands to reason that if you consume 1,500 cal/day and don't lose weight, then you must be burning up about 1,500 cals/day to stay weight neutral.     5. If you really want to lose weight, you must cut your calorie intake 300 calories /day and at that rate you should lose about 1 # every 3 days.   6. Please purchase Dr Fara Olden Fuhrman's book(s) "The End of Dieting" & "Eat to Live" . It has some great concepts and recipes.

## 2016-08-02 LAB — BASIC METABOLIC PANEL WITH GFR
BUN: 11 mg/dL (ref 7–25)
CALCIUM: 9.3 mg/dL (ref 8.6–10.3)
CO2: 22 mmol/L (ref 20–31)
Chloride: 103 mmol/L (ref 98–110)
Creat: 0.91 mg/dL (ref 0.70–1.25)
GLUCOSE: 116 mg/dL — AB (ref 65–99)
Potassium: 4.3 mmol/L (ref 3.5–5.3)
SODIUM: 140 mmol/L (ref 135–146)

## 2016-08-02 LAB — HEPATIC FUNCTION PANEL
ALT: 23 U/L (ref 9–46)
AST: 19 U/L (ref 10–35)
Albumin: 4.4 g/dL (ref 3.6–5.1)
Alkaline Phosphatase: 51 U/L (ref 40–115)
BILIRUBIN DIRECT: 0.1 mg/dL (ref <=0.2)
BILIRUBIN TOTAL: 0.7 mg/dL (ref 0.2–1.2)
Indirect Bilirubin: 0.6 mg/dL (ref 0.2–1.2)
Total Protein: 7.1 g/dL (ref 6.1–8.1)

## 2016-08-02 LAB — LIPID PANEL
CHOLESTEROL: 222 mg/dL — AB (ref <200)
HDL: 34 mg/dL — ABNORMAL LOW (ref >40)
LDL Cholesterol: 116 mg/dL — ABNORMAL HIGH (ref <100)
TRIGLYCERIDES: 361 mg/dL — AB (ref <150)
Total CHOL/HDL Ratio: 6.5 Ratio — ABNORMAL HIGH (ref <5.0)
VLDL: 72 mg/dL — AB (ref <30)

## 2016-08-02 LAB — VITAMIN D 25 HYDROXY (VIT D DEFICIENCY, FRACTURES): Vit D, 25-Hydroxy: 20 ng/mL — ABNORMAL LOW (ref 30–100)

## 2016-08-02 LAB — HEMOGLOBIN A1C
Hgb A1c MFr Bld: 7 % — ABNORMAL HIGH (ref <5.7)
Mean Plasma Glucose: 154 mg/dL

## 2016-08-02 LAB — MAGNESIUM: MAGNESIUM: 1.9 mg/dL (ref 1.5–2.5)

## 2016-08-07 ENCOUNTER — Encounter: Payer: Self-pay | Admitting: Physician Assistant

## 2016-08-07 ENCOUNTER — Other Ambulatory Visit: Payer: Self-pay | Admitting: Physician Assistant

## 2016-08-07 MED ORDER — BENZONATATE 100 MG PO CAPS: 200.0000 mg | ORAL_CAPSULE | Freq: Three times a day (TID) | ORAL | 1 refills | Status: DC | PRN

## 2016-08-07 MED FILL — BENZONATATE 100 MG CAPSULE: 100 | 10 days supply | Qty: 60 | Fill #0

## 2016-08-14 ENCOUNTER — Other Ambulatory Visit: Payer: Self-pay | Admitting: Physician Assistant

## 2016-08-14 MED ORDER — PROMETHAZINE-CODEINE 6.25-10 MG/5ML PO SYRP: 5.0000 mL | ORAL_SOLUTION | Freq: Four times a day (QID) | ORAL | 0 refills | Status: DC | PRN

## 2016-08-14 MED ORDER — PROMETHAZINE-DM 6.25-15 MG/5ML PO SYRP: 5.0000 mL | ORAL_SOLUTION | Freq: Four times a day (QID) | ORAL | 1 refills | Status: DC | PRN

## 2016-08-14 MED FILL — PROMETHAZINE-CODEINE SYRUP: 6.25-10 | 12 days supply | Qty: 240 | Fill #0

## 2016-08-14 NOTE — Telephone Encounter (Signed)
-----   Message from Elenor Quinones, [place] sent at 08/14/2016  9:29 AM EST ----- Regarding: Med on back order The promethazine-dm that was sent to the patients pharmacy is on back order.   Pharmacy would like a new Rx for a new cough syrup.

## 2016-08-21 DIAGNOSIS — L570 Actinic keratosis: Secondary | ICD-10-CM | POA: Diagnosis not present

## 2016-08-28 DIAGNOSIS — D241 Benign neoplasm of right breast: Secondary | ICD-10-CM | POA: Diagnosis not present

## 2016-08-29 ENCOUNTER — Telehealth: Payer: Self-pay | Admitting: Neurology

## 2016-08-29 DIAGNOSIS — G4761 Periodic limb movement disorder: Secondary | ICD-10-CM

## 2016-08-29 DIAGNOSIS — G2581 Restless legs syndrome: Secondary | ICD-10-CM

## 2016-08-29 DIAGNOSIS — Z8659 Personal history of other mental and behavioral disorders: Secondary | ICD-10-CM

## 2016-08-29 NOTE — Telephone Encounter (Signed)
Pt's wife request refill for rOPINIRole (REQUIP) 0.25 MG tablet. She called pharmacy and was told they did not receive any RX for this medication (12/17).

## 2016-08-30 MED ORDER — ROPINIROLE HCL 0.25 MG PO TABS: 0.2500 mg | ORAL_TABLET | Freq: Every day | ORAL | 3 refills | Status: DC

## 2016-08-30 MED FILL — rOPINIRole HCL 0.25 MG TABS: 0.25 | 90 days supply | Qty: 90 | Fill #0

## 2016-08-30 NOTE — Telephone Encounter (Signed)
LM asking for a call back to verify which pharmacy to send Rx to?

## 2016-08-30 NOTE — Addendum Note (Signed)
Addended by: Laurence Spates on: 08/30/2016 12:13 PM   Modules accepted: Orders

## 2016-08-30 NOTE — Telephone Encounter (Signed)
Patients wife called back to verify pharmacy is [facility].

## 2016-08-30 NOTE — Telephone Encounter (Signed)
Confirmed and new Rx sent in

## 2016-09-19 MED FILL — VALSARTAN 80 MG TABLET: 80 | 30 days supply | Qty: 30 | Fill #1

## 2016-09-19 MED FILL — INVOKAMET XR 150-1,000 MG T: 150-1000 | 90 days supply | Qty: 180 | Fill #0

## 2016-11-01 NOTE — Progress Notes (Signed)
Assessment and Plan:   Essential hypertension - continue medications, DASH diet, exercise and monitor at home. Call if greater than 130/80.  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 - Hemoglobin A1c  Hyperlipidemia, unspecified hyperlipidemia type -continue medications, check lipids, decrease fatty foods, increase activity.  - Lipid panel  Morbid obesity, unspecified obesity type ([place]) - long discussion about weight loss, diet, and exercise  Vitamin D deficiency - VITAMIN D 25 Hydroxy (Vit-D Deficiency, Fractures)  Medication management - Magnesium  Blurry vision Check sugars, get better control See eye doctor ASAP if worsening HA, changes vision/speech, imbalance, weakness go to the ER  RLS Try mirapex after melatonin.   Follow up 3 months Continue diet and meds as discussed.  Future Appointments Date Time Provider [place]  03/26/2017 2:00 PM Vicie Mutters, PA-C [facility] None     HPI 61 y.o. male  presents for 3 month follow up with hypertension, hyperlipidemia, prediabetes and vitamin D. His blood pressure is normally controlled at home, switched from ACE to ARB due to cough last visit, and it is better  today his BP is BP: 122/74 He does workout. He denies chest pain, shortness of breath, dizziness.  He is not on cholesterol medication, he can not tolerate statins and denies myalgias. His cholesterol is not at goal. The cholesterol last visit was:   Lab Results  Component Value Date   CHOL 222 (H) 08/01/2016   HDL 34 (L) 08/01/2016   LDLCALC 116 (H) 08/01/2016   TRIG 361 (H) 08/01/2016   CHOLHDL 6.5 (H) 08/01/2016   He has been working on diet and exercise for diabetes with CKD, he is on bASA, he is on ARB, not checking sugars at home, went down to 1 invokamet a day and has increased water, wife Judeen Hammans is here and states that he has not been eating well, has been having blurry vision intermittent x 3 months, has been over a year since seen eye doctor, no vision going out on him, bilateral eyes and denies paresthesia of the feet, polydipsia and polyuria. Last A1C in the office was:  Lab Results  Component Value Date   HGBA1C 7.0 (H) 08/01/2016   Lab Results  Component Value Date   GFRNONAA >89 08/01/2016   Patient is on Vitamin D supplement.   Lab Results  Component Value Date   VD25OH 20 (L) 08/01/2016     He is not on CPAP, had negative sleep study, has REM behavior disorder and RLS, on melatonin and has not started requip.  BMI is Body mass index is 33.36 kg/m., he is working on diet and exercise and has done well.  Wt Readings from Last 3 Encounters:  11/02/16 246 lb (111.6 kg)  08/01/16 244 lb (110.7 kg)  06/25/16 243 lb (110.2 kg)   Current Medications:  Current Outpatient Prescriptions on File Prior to Visit  Medication Sig Dispense Refill  . aspirin 81 MG tablet Take 81 mg by mouth daily.    . Canagliflozin-Metformin HCl ER (INVOKAMET XR) [phone] MG TB24 Take 300 mg by mouth every morning. Take 2 pills every morning for your sugar (Patient taking differently: Take 300 mg by mouth every morning.  Take 1 pills every morning for your sugar) 60 tablet 2  . citalopram (CELEXA) 40 MG tablet Take 1 tablet (40 mg total) by mouth daily. 90 tablet 1  . Melatonin 3 MG TBDP Take 3 mg by mouth Nightly. 30 tablet 0  . omeprazole (PRILOSEC) 20 MG capsule TAKE 1 CAPSULE BY MOUTH ONCE DAILY 90 capsule 1  . TURMERIC PO Take by mouth.    . valsartan (DIOVAN) 80 MG tablet Take 1 tablet (80 mg total) by mouth daily. 30 tablet 3   No current  facility-administered medications on file prior to visit.    Medical History:  Past Medical History:  Diagnosis Date  . Cancer ([place])   . Diabetes mellitus without complication ([place])   . GERD (gastroesophageal reflux disease)   . Hyperlipidemia   . Hypertension   . OSA on CPAP 08/03/2013  . Unspecified vitamin D deficiency    Allergies:  Allergies  Allergen Reactions  . Caduet [Amlodipine-Atorvastatin]   . Statins     BLURRED VISION    Review of Systems  Constitutional: Negative.   HENT: Negative for congestion, ear discharge, ear pain, hearing loss, nosebleeds, sore throat and tinnitus.   Eyes: Negative.   Respiratory: Negative.  Negative for cough, shortness of breath and stridor.   Cardiovascular: Positive for leg swelling. Negative for chest pain, palpitations, orthopnea, claudication and PND.  Gastrointestinal: Negative.   Genitourinary: Negative.  Negative for frequency and urgency.  Musculoskeletal: Positive for back pain and joint pain. Negative for falls, myalgias and neck pain.  Skin: Negative.   Neurological: Negative.  Negative for dizziness and headaches.  Psychiatric/Behavioral: Negative for depression, hallucinations, memory loss, substance abuse and suicidal ideas. The patient has insomnia. The patient is not nervous/anxious.     Family history- Review and unchanged Social history- Review and unchanged Physical Exam: BP 122/74   Pulse 66   Temp 97.1 F (36.2 C)   Resp 16   Ht 6' (1.829 m)   Wt 246 lb (111.6 kg)   SpO2 97%   BMI 33.36 kg/m  Wt Readings from Last 3 Encounters:  11/02/16 246 lb (111.6 kg)  08/01/16 244 lb (110.7 kg)  06/25/16 243 lb (110.2 kg)   General Appearance: Well nourished, in no apparent distress. Eyes: PERRLA, EOMs, conjunctiva no swelling or erythema Sinuses: No Frontal/maxillary tenderness ENT/Mouth: Ext aud canals clear, TMs without erythema, bulging. No erythema, swelling, or exudate on post pharynx.  Tonsils not swollen  or erythematous. Hearing normal.  Neck: Supple, thyroid normal.  Respiratory: Respiratory effort normal, BS equal bilaterally without rales, rhonchi, wheezing or stridor.  Cardio: RRR with no MRGs. Brisk peripheral pulses without edema.  Abdomen: Soft, + BS.  Non tender, no guarding, rebound, hernias, masses. Lymphatics: Non tender without lymphadenopathy.  Musculoskeletal: Full ROM, 5/5 strength, normal gait.  Skin: Warm, dry without rashes, lesions, ecchymosis.  Neuro: Cranial nerves intact. Normal muscle tone, no cerebellar symptoms. Sensation intact.  Psych: Awake and oriented X 3, normal affect, Insight and Judgment appropriate.    Vicie Mutters 8:51 AM

## 2016-11-02 ENCOUNTER — Encounter: Payer: Self-pay | Admitting: Physician Assistant

## 2016-11-02 ENCOUNTER — Ambulatory Visit (INDEPENDENT_AMBULATORY_CARE_PROVIDER_SITE_OTHER): Payer: 59 | Admitting: Physician Assistant

## 2016-11-02 VITALS — BP 122/74 | HR 66 | Temp 97.1°F | Resp 16 | Ht 72.0 in | Wt 246.0 lb

## 2016-11-02 DIAGNOSIS — Z79899 Other long term (current) drug therapy: Secondary | ICD-10-CM | POA: Diagnosis not present

## 2016-11-02 DIAGNOSIS — E785 Hyperlipidemia, unspecified: Secondary | ICD-10-CM | POA: Diagnosis not present

## 2016-11-02 DIAGNOSIS — E559 Vitamin D deficiency, unspecified: Secondary | ICD-10-CM

## 2016-11-02 DIAGNOSIS — F411 Generalized anxiety disorder: Secondary | ICD-10-CM

## 2016-11-02 DIAGNOSIS — I10 Essential (primary) hypertension: Secondary | ICD-10-CM

## 2016-11-02 DIAGNOSIS — E1142 Type 2 diabetes mellitus with diabetic polyneuropathy: Secondary | ICD-10-CM | POA: Diagnosis not present

## 2016-11-02 LAB — HEPATIC FUNCTION PANEL
ALBUMIN: 4.2 g/dL (ref 3.6–5.1)
ALT: 27 U/L (ref 9–46)
AST: 21 U/L (ref 10–35)
Alkaline Phosphatase: 48 U/L (ref 40–115)
BILIRUBIN TOTAL: 0.8 mg/dL (ref 0.2–1.2)
Bilirubin, Direct: 0.1 mg/dL (ref <=0.2)
Indirect Bilirubin: 0.7 mg/dL (ref 0.2–1.2)
Total Protein: 6.8 g/dL (ref 6.1–8.1)

## 2016-11-02 LAB — BASIC METABOLIC PANEL WITH GFR
BUN: 13 mg/dL (ref 7–25)
CALCIUM: 8.9 mg/dL (ref 8.6–10.3)
CO2: 23 mmol/L (ref 20–31)
CREATININE: 0.98 mg/dL (ref 0.70–1.25)
Chloride: 102 mmol/L (ref 98–110)
GFR, Est Non African American: 83 mL/min (ref >=60)
Glucose, Bld: 181 mg/dL — ABNORMAL HIGH (ref 65–99)
Potassium: 4.3 mmol/L (ref 3.5–5.3)
Sodium: 138 mmol/L (ref 135–146)

## 2016-11-02 LAB — CBC WITH DIFFERENTIAL/PLATELET
BASOS ABS: 0 {cells}/uL (ref 0–200)
Basophils Relative: 0 %
EOS ABS: 224 {cells}/uL (ref 15–500)
Eosinophils Relative: 4 %
HCT: 41.4 % (ref 38.5–50.0)
Hemoglobin: 14 g/dL (ref 13.2–17.1)
LYMPHS PCT: 51 %
Lymphs Abs: 2856 cells/uL (ref 850–3900)
MCH: 28.4 pg (ref 27.0–33.0)
MCHC: 33.8 g/dL (ref 32.0–36.0)
MCV: 84 fL (ref 80.0–100.0)
MONOS PCT: 8 %
MPV: 9.4 fL (ref 7.5–12.5)
Monocytes Absolute: 448 cells/uL (ref 200–950)
Neutro Abs: 2072 cells/uL (ref 1500–7800)
Neutrophils Relative %: 37 %
PLATELETS: 175 10*3/uL (ref 140–400)
RBC: 4.93 MIL/uL (ref 4.20–5.80)
RDW: 13.5 % (ref 11.0–15.0)
WBC: 5.6 10*3/uL (ref 3.8–10.8)

## 2016-11-02 LAB — LIPID PANEL
Cholesterol: 233 mg/dL — ABNORMAL HIGH (ref <200)
HDL: 34 mg/dL — ABNORMAL LOW (ref >40)
LDL Cholesterol: 125 mg/dL — ABNORMAL HIGH (ref <100)
TRIGLYCERIDES: 369 mg/dL — AB (ref <150)
Total CHOL/HDL Ratio: 6.9 Ratio — ABNORMAL HIGH (ref <5.0)
VLDL: 74 mg/dL — ABNORMAL HIGH (ref <30)

## 2016-11-02 MED ORDER — VALSARTAN 80 MG PO TABS: 80.0000 mg | ORAL_TABLET | Freq: Every day | ORAL | 1 refills | Status: DC

## 2016-11-02 MED ORDER — PRAMIPEXOLE DIHYDROCHLORIDE 0.125 MG PO TABS: 0.1250 mg | ORAL_TABLET | Freq: Every day | ORAL | 2 refills | Status: DC

## 2016-11-02 MED FILL — VALSARTAN 80 MG TABLET: 80 | 90 days supply | Qty: 90 | Fill #0

## 2016-11-02 NOTE — Patient Instructions (Signed)
Mirapex (generic name: pramipexole) 0.125 mg: Take 1 pill each night for 1 week, the 2 pills each night for 1 week, then 3 pills each night thereafter. Common side effects reported are: Sedation, sleepiness, nausea, vomiting, and rare side effects are swelling in legs.    Simple math prevails.    1st - exercise does not produce significant weight loss - at best one converts fat into muscle , "bulks up", loses inches, but usually stays "weight neutral"     2nd - think of your body weightas a check book: If you eat more calories than you burn up - you save money or gain weight .... Or if you spend more money than you put in the check book, ie burn up more calories than you eat, then you lose weight     3rd - if you walk or run 1 mile, you burn up 100 calories - you have to burn up 3,500 calories to lose 1 pound, ie you have to walk/run 35 miles to lose 1 measly pound. So if you want to lose 10 #, then you have to walk/run 350 miles, so.... clearly exercise is not the solution.     4. So if you consume 1,500 calories, then you have to burn up the equivalent of 15 miles to stay weight neutral - It also stands to reason that if you consume 1,500 cal/day and don't lose weight, then you must be burning up about 1,500 cals/day to stay weight neutral.     5. If you really want to lose weight, you must cut your calorie intake 300 calories /day and at that rate you should lose about 1 # every 3 days.   6. Please purchase Dr Fara Olden Fuhrman's book(s) "The End of Dieting" & "Eat to Live" . It has some great concepts and recipes.         Bad carbs also include fruit juice, alcohol, and sweet tea. These are empty calories that do not signal to your brain that you are full.   Please remember the good carbs are still carbs which convert into sugar. So please measure them out no more than 1/2-1 cup of rice, oatmeal, pasta, and beans  Veggies are however free foods! Pile them on.   Not all fruit is created  equal. Please see the list below, the fruit at the bottom is higher in sugars than the fruit at the top. Please avoid all dried fruits.

## 2016-11-03 LAB — HEMOGLOBIN A1C
Hgb A1c MFr Bld: 7.4 % — ABNORMAL HIGH (ref <5.7)
Mean Plasma Glucose: 166 mg/dL

## 2016-11-03 LAB — MAGNESIUM: MAGNESIUM: 1.9 mg/dL (ref 1.5–2.5)

## 2016-11-03 LAB — VITAMIN D 25 HYDROXY (VIT D DEFICIENCY, FRACTURES): VIT D 25 HYDROXY: 23 ng/mL — AB (ref 30–100)

## 2016-11-03 LAB — TSH: TSH: 2.88 m[IU]/L (ref 0.40–4.50)

## 2016-11-13 MED FILL — OMEPRAZOLE DR 20 MG CAPSULE: 20 | 90 days supply | Qty: 90 | Fill #1

## 2016-11-14 ENCOUNTER — Encounter: Payer: Self-pay | Admitting: Physician Assistant

## 2016-12-04 DIAGNOSIS — L821 Other seborrheic keratosis: Secondary | ICD-10-CM | POA: Diagnosis not present

## 2016-12-04 DIAGNOSIS — L814 Other melanin hyperpigmentation: Secondary | ICD-10-CM | POA: Diagnosis not present

## 2016-12-04 DIAGNOSIS — Z85828 Personal history of other malignant neoplasm of skin: Secondary | ICD-10-CM | POA: Diagnosis not present

## 2016-12-04 DIAGNOSIS — L570 Actinic keratosis: Secondary | ICD-10-CM | POA: Diagnosis not present

## 2016-12-04 DIAGNOSIS — D1801 Hemangioma of skin and subcutaneous tissue: Secondary | ICD-10-CM | POA: Diagnosis not present

## 2016-12-24 MED FILL — CITALOPRAM HBR 40 MG TABLET: 40 | 90 days supply | Qty: 90 | Fill #1

## 2017-03-12 ENCOUNTER — Other Ambulatory Visit: Payer: Self-pay | Admitting: Internal Medicine

## 2017-03-12 MED FILL — OMEPRAZOLE 20 MG CAP: 20 | 90 days supply | Qty: 90 | Fill #0

## 2017-03-20 ENCOUNTER — Other Ambulatory Visit: Payer: Self-pay | Admitting: Physician Assistant

## 2017-03-20 MED ORDER — LOSARTAN POTASSIUM 50 MG PO TABS: 50.0000 mg | ORAL_TABLET | Freq: Every day | ORAL | 11 refills | Status: DC

## 2017-03-21 MED FILL — LOSARTAN POTASSIUM 50 MG TA: 50 | 90 days supply | Qty: 90 | Fill #0

## 2017-03-25 NOTE — Progress Notes (Signed)
Complete Physical  Assessment and Plan: Diabetes mellitus without complication Discussed general issues about diabetes pathophysiology and management., Educational material distributed., Suggested low cholesterol diet., Encouraged aerobic exercise., Discussed foot care., Reminded to get yearly retinal exam. - switched from invokana to jardiance - Hemoglobin A1c - Insulin, fasting  Hyperlipidemia -continue medications, check lipids, decrease fatty foods, increase activity. Declines statin due to intolerance, will start on zetia  - Lipid panel   Essential hypertension - continue medications, DASH diet, exercise and monitor at home. Call if greater than 130/80.  - CBC with Differential/Platelet - BASIC METABOLIC PANEL WITH GFR - Hepatic function panel - TSH - Urinalysis, Routine w reflex microscopic (not at [facility]) - Microalbumin / creatinine urine ratio - EKG 12-Lead  Morbid Obesity Obesity with co morbidities- long discussion about weight loss, diet, and exercise - last sleep study negative  Gastroesophageal reflux disease, esophagitis presence not specified Continue PPI/H2 blocker, diet discussed  Vitamin D deficiency - Vit D  25 hydroxy (rtn osteoporosis monitoring)  History of SCC (squamous cell carcinoma) of skin Follow up DERM  Varicose veins Varicose veins- weight loss discussed, continue compression stockings and elevation\  Encounter for general adult medical examination with abnormal findings  Medication management - Magnesium  Anemia, unspecified anemia type - Vitamin B12  Diabetes with peripheral neuropathy Decrease sugars, monitor feet.  - switch to jardiance  Insomnia May start seroquel low dose if tolerates zetia/jardiance switch  Discussed med's effects and SE's. Screening labs and tests as requested with regular follow-up as recommended. Over 40 minutes of exam, counseling, chart review and critical decision making was performed  HPI Patient  presents for a complete physical.  61 y.o. WM.  His blood pressure has been controlled at home, monitoring at home, today their BP is BP: 124/76 He does not workout, but stays active,working for [employer], works 3 days a week, lots of walking, retired Psychologist, occupational. He denies chest pain, shortness of breath, dizziness.  He is on celexa for anxiety, only on 1/2 a pill.  He is not on cholesterol medication and denies myalgias. His cholesterol is not at goal. The cholesterol last visit was:   Lab Results  Component Value Date   CHOL 233 (H) 11/02/2016   HDL 34 (L) 11/02/2016   LDLCALC 125 (H) 11/02/2016   TRIG 369 (H) 11/02/2016   CHOLHDL 6.9 (H) 11/02/2016   He has been working on diet and exercise for diabetes with CKD, he is on invokana/metformin, he is on bASA, he is on ACE/ARB and denies paresthesia of the feet, polydipsia, polyuria and visual disturbances. Last A1C in the office was:  Lab Results  Component Value Date   HGBA1C 7.4 (H) 11/02/2016   Lab Results  Component Value Date   GFRNONAA 83 11/02/2016   Patient is on Vitamin D supplement, not on vitamin D.   Lab Results  Component Value Date   VD25OH 23 (L) 11/02/2016     Last PSA was: Lab Results  Component Value Date   PSA 0.2 03/27/2016   BMI is Body mass index is 32.88 kg/m., he is working on diet and exercise. Last sleep study negative, not on CPAP. Wife states has PTSD/trashing in dreams, tried melatonin, minipress, and requip.  Wt Readings from Last 3 Encounters:  03/26/17 242 lb 6.4 oz (110 kg)  11/02/16 246 lb (111.6 kg)  08/01/16 244 lb (110.7 kg)    Current Medications:  Current Outpatient Prescriptions on File Prior to Visit  Medication Sig  Dispense Refill  . aspirin 81 MG tablet Take 81 mg by mouth daily.    . Canagliflozin-Metformin HCl ER (INVOKAMET XR) [phone] MG TB24 Take 300 mg by mouth every morning. Take 2 pills every morning for your sugar (Patient taking differently: Take 300 mg by mouth  every morning. Take 1 pills every morning for your sugar) 60 tablet 2  . citalopram (CELEXA) 40 MG tablet Take 1 tablet (40 mg total) by mouth daily. 90 tablet 1  . losartan (COZAAR) 50 MG tablet Take 1 tablet (50 mg total) by mouth daily. 30 tablet 11  . omeprazole (PRILOSEC) 20 MG capsule TAKE 1 CAPSULE BY MOUTH ONCE DAILY 90 capsule 0  . TURMERIC PO Take by mouth.     No current facility-administered medications on file prior to visit.    Health Maintenance:  Immunization History  Administered Date(s) Administered  . Influenza-Unspecified 05/07/2013  . Pneumococcal Polysaccharide-23 06/24/2013  . Td 07/09/2006   Tetanus: 2008 DUE Pneumovax: 2014 Prevnar 13: Flu vaccine: at work Zostavax: DEXA: Colonoscopy: Dr. Benson Norway 2008  EGD: Sleep study 2017 Eye Exam: [facility] 2016 Dentist:  Patient Care Team: Unk Pinto, MD as PCP - General (Internal Medicine)  Dr. Johnsie Cancel 2007 normal stress test Dr. Kellie Simmering Dr. Benson Norway Dr. Lorin Mercy  Dr. Kristian Covey  Medical History:  Past Medical History:  Diagnosis Date  . Cancer ([place])   . Diabetes mellitus without complication ([place])   . GERD (gastroesophageal reflux disease)   . Hyperlipidemia   . Hypertension   . OSA on CPAP 08/03/2013  . Unspecified vitamin D deficiency    Allergies Allergies  Allergen Reactions  . Caduet [Amlodipine-Atorvastatin]   . Statins     BLURRED VISION    SURGICAL HISTORY He  has a past surgical history that includes orthopedic surgeries and Laser ablation. FAMILY HISTORY His family history includes Cancer in his mother; Diabetes in his sister; Heart disease in his father; Hyperlipidemia in his father; Hypertension in his brother. SOCIAL HISTORY He  reports that he quit smoking about 23 years ago. He has never used smokeless tobacco. He reports that he does not drink alcohol or use drugs.  Review of Systems:  Review of Systems  Constitutional: Negative.   HENT: Negative for congestion, ear discharge,  ear pain, hearing loss, nosebleeds, sore throat and tinnitus.   Eyes: Negative.   Respiratory: Negative.  Negative for cough, shortness of breath and stridor.   Cardiovascular: Positive for leg swelling. Negative for chest pain, palpitations, orthopnea, claudication and PND.  Gastrointestinal: Negative.   Genitourinary: Negative.  Negative for frequency and urgency.  Musculoskeletal: Positive for back pain and joint pain. Negative for falls, myalgias and neck pain.  Skin: Negative.   Neurological: Negative.  Negative for dizziness and headaches.  Psychiatric/Behavioral: Negative for depression, hallucinations, memory loss, substance abuse and suicidal ideas. The patient has insomnia. The patient is not nervous/anxious.     Physical Exam: Estimated body mass index is 32.88 kg/m as calculated from the following:   Height as of this encounter: 6' (1.829 m).   Weight as of this encounter: 242 lb 6.4 oz (110 kg). BP 124/76   Pulse 83   Temp 97.7 F (36.5 C)   Resp 18   Ht 6' (1.829 m)   Wt 242 lb 6.4 oz (110 kg)   SpO2 97%   BMI 32.88 kg/m  General Appearance: Well nourished, in no apparent distress.  Eyes: PERRLA, EOMs, conjunctiva no swelling or erythema, normal fundi and  vessels.  Sinuses: No Frontal/maxillary tenderness  ENT/Mouth: Ext aud canals clear, normal light reflex with TMs without erythema, bulging. Good dentition. No erythema, swelling, or exudate on post pharynx. Tonsils not swollen or erythematous. Hearing normal.  Neck: Supple, thyroid normal. No bruits  Respiratory: Respiratory effort normal, BS equal bilaterally without rales, rhonchi, wheezing or stridor.  Cardio: RRR without murmurs, rubs or gallops. Brisk peripheral pulses without edema.  Chest: symmetric, with normal excursions and percussion.  Abdomen: Soft, nontender, no guarding, rebound, hernias, masses, or organomegaly.  Lymphatics: Non tender without lymphadenopathy.  Genitourinary: defer Musculoskeletal:  Full ROM all peripheral extremities,5/5 strength, and normal gait.Bilateral varicose veins.   Skin: Warm, dry without rashes, lesions, ecchymosis. Neuro: Cranial nerves intact, reflexes equal bilaterally. Normal muscle tone, no cerebellar symptoms. Sensation decreased bilateral feet to ankle. Psych: Awake and oriented X 3, normal affect, Insight and Judgment appropriate.   EKG: WNL no changes. AORTA SCAN: defer  Vicie Mutters 2:27 PM [facility]

## 2017-03-26 ENCOUNTER — Encounter: Payer: Self-pay | Admitting: Physician Assistant

## 2017-03-26 ENCOUNTER — Ambulatory Visit (INDEPENDENT_AMBULATORY_CARE_PROVIDER_SITE_OTHER): Payer: 59 | Admitting: Physician Assistant

## 2017-03-26 VITALS — BP 124/76 | HR 83 | Temp 97.7°F | Resp 18 | Ht 72.0 in | Wt 242.4 lb

## 2017-03-26 DIAGNOSIS — D649 Anemia, unspecified: Secondary | ICD-10-CM | POA: Diagnosis not present

## 2017-03-26 DIAGNOSIS — Z0000 Encounter for general adult medical examination without abnormal findings: Secondary | ICD-10-CM

## 2017-03-26 DIAGNOSIS — Z8659 Personal history of other mental and behavioral disorders: Secondary | ICD-10-CM

## 2017-03-26 DIAGNOSIS — I8393 Asymptomatic varicose veins of bilateral lower extremities: Secondary | ICD-10-CM

## 2017-03-26 DIAGNOSIS — F411 Generalized anxiety disorder: Secondary | ICD-10-CM

## 2017-03-26 DIAGNOSIS — E785 Hyperlipidemia, unspecified: Secondary | ICD-10-CM

## 2017-03-26 DIAGNOSIS — Z9989 Dependence on other enabling machines and devices: Secondary | ICD-10-CM

## 2017-03-26 DIAGNOSIS — I10 Essential (primary) hypertension: Secondary | ICD-10-CM | POA: Diagnosis not present

## 2017-03-26 DIAGNOSIS — E1142 Type 2 diabetes mellitus with diabetic polyneuropathy: Secondary | ICD-10-CM | POA: Diagnosis not present

## 2017-03-26 DIAGNOSIS — G4733 Obstructive sleep apnea (adult) (pediatric): Secondary | ICD-10-CM

## 2017-03-26 DIAGNOSIS — K219 Gastro-esophageal reflux disease without esophagitis: Secondary | ICD-10-CM

## 2017-03-26 DIAGNOSIS — Z85828 Personal history of other malignant neoplasm of skin: Secondary | ICD-10-CM

## 2017-03-26 DIAGNOSIS — E559 Vitamin D deficiency, unspecified: Secondary | ICD-10-CM

## 2017-03-26 DIAGNOSIS — G2581 Restless legs syndrome: Secondary | ICD-10-CM

## 2017-03-26 DIAGNOSIS — Z0001 Encounter for general adult medical examination with abnormal findings: Secondary | ICD-10-CM

## 2017-03-26 DIAGNOSIS — Z79899 Other long term (current) drug therapy: Secondary | ICD-10-CM

## 2017-03-26 MED ORDER — EZETIMIBE 10 MG PO TABS: 10.0000 mg | ORAL_TABLET | Freq: Every day | ORAL | 11 refills | Status: DC

## 2017-03-26 MED ORDER — EMPAGLIFLOZIN-METFORMIN HCL ER 25-1000 MG PO TB24: 25.0000 mg | ORAL_TABLET | Freq: Every day | ORAL | 3 refills | Status: DC

## 2017-03-26 MED FILL — EZETIMIBE 10 MG TAB: 10 | 30 days supply | Qty: 30 | Fill #0

## 2017-03-26 NOTE — Patient Instructions (Addendum)
Call Dr. Benson Norway for colonoscopy Phone: [phone];  Stop invokana and start jardiance/metformin  Need tetanus  Try zetia x 2-4 weeks  Put peroxide in your ear Stop qtips Your ears and sinuses are connected by the eustachian tube. When your sinuses are inflamed, this can close off the tube and cause fluid to collect in your middle ear. This can then cause dizziness, popping, clicking, ringing, and echoing in your ears. This is often NOT an infection and does NOT require antibiotics, it is caused by inflammation so the treatments help the inflammation. This can take a long time to get better so please be patient.  Here are things you can do to help with this: - Try the Flonase or Nasonex. Remember to spray each nostril twice towards the outer part of your eye.  Do not sniff but instead pinch your nose and tilt your head back to help the medicine get into your sinuses.  The best time to do this is at bedtime.Stop if you get blurred vision or nose bleeds.  -While drinking fluids, pinch and hold nose close and swallow, to help open eustachian tubes to drain fluid behind ear drums. -Please pick one of the over the counter allergy medications below and take it once daily for allergies.  It will also help with fluid behind ear drums. Claritin or loratadine cheapest but likely the weakest  Zyrtec or certizine at night because it can make you sleepy The strongest is allegra or fexafinadine  Cheapest at [employer], [employer], [employer] -can use decongestant over the counter, please do not use if you have high blood pressure or certain heart conditions.   if worsening HA, changes vision/speech, imbalance, weakness go to the ER   If you are doing well with it, can try to send in seroquel for sleep Start 1/2 pill and can increase to 50mg  at night for sleep     Bad carbs also include fruit juice, alcohol, and sweet tea. These are empty calories that do not signal to your brain that you are full.   Please  remember the good carbs are still carbs which convert into sugar. So please measure them out no more than 1/2-1 cup of rice, oatmeal, pasta, and beans  Veggies are however free foods! Pile them on.   Not all fruit is created equal. Please see the list below, the fruit at the bottom is higher in sugars than the fruit at the top. Please avoid all dried fruits.   Diabetes is a very complicated disease...lets simplify it.  An easy way to look at it to understand the complications is if you think of the extra sugar floating in your blood stream as glass shards floating through your blood stream.    Diabetes affects your small vessels first: 1) The glass shards (sugar) scraps down the tiny blood vessels in your eyes and lead to diabetic retinopathy, the leading cause of blindness in the [place]. Diabetes is the leading cause of newly diagnosed adult (76 to 62 years of age) blindness in the [place].  2) The glass shards scratches down the tiny vessels of your legs leading to nerve damage called neuropathy and can lead to amputations of your feet. More than 60% of all non-traumatic amputations of lower limbs occur in people with diabetes.  3) Over time the small vessels in your brain are shredded and closed off, individually this does not cause any problems but over a long period of time many of the small vessels being  blocked can lead to Vascular Dementia.   4) Your kidney's are a filter system and have a "net" that keeps certain things in the body and lets bad things out. Sugar shreds this net and leads to kidney damage and eventually failure. Decreasing the sugar that is destroying the net and certain blood pressure medications can help stop or decrease progression of kidney disease. Diabetes was the primary cause of kidney failure in 44 percent of all new cases in 2011.  5) Diabetes also destroys the small vessels in your penis that lead to erectile dysfunction. Eventually the vessels are so damaged  that you may not be responsive to cialis or viagra.   Diabetes and your large vessels: Your larger vessels consist of your coronary arteries in your heart and the carotid vessels to your brain. Diabetes or even increased sugars put you at 300% increased risk of heart attack and stroke and this is why.. The sugar scrapes down your large blood vessels and your body sees this as an internal injury and tries to repair itself. Just like you get a scab on your skin, your platelets will stick to the blood vessel wall trying to heal it. This is why we have diabetics on low dose aspirin daily, this prevents the platelets from sticking and can prevent plaque formation. In addition, your body takes cholesterol and tries to shove it into the open wound. This is why we want your LDL, or bad cholesterol, below 70.   The combination of platelets and cholesterol over 5-10 years forms plaque that can break off and cause a heart attack or stroke.   PLEASE REMEMBER:  Diabetes is preventable! Up to 17 percent of complications and morbidities among individuals with type 2 diabetes can be prevented, delayed, or effectively treated and minimized with regular visits to a health professional, appropriate monitoring and medication, and a healthy diet and lifestyle.

## 2017-03-27 ENCOUNTER — Other Ambulatory Visit: Payer: Self-pay | Admitting: Physician Assistant

## 2017-03-27 DIAGNOSIS — E785 Hyperlipidemia, unspecified: Secondary | ICD-10-CM

## 2017-03-27 LAB — URINALYSIS W MICROSCOPIC + REFLEX CULTURE
Bacteria, UA: NONE SEEN /HPF
Bilirubin Urine: NEGATIVE
HYALINE CAST: NONE SEEN /LPF
Hgb urine dipstick: NEGATIVE
Ketones, ur: NEGATIVE
Leukocyte Esterase: NEGATIVE
Nitrites, Initial: NEGATIVE
PROTEIN: NEGATIVE
RBC / HPF: NONE SEEN /HPF (ref 0–2)
SPECIFIC GRAVITY, URINE: 1.039 — AB (ref 1.001–1.03)
Squamous Epithelial / LPF: NONE SEEN /HPF (ref <=5)
WBC, UA: NONE SEEN /HPF (ref 0–5)
pH: 6.5 (ref 5.0–8.0)

## 2017-03-27 LAB — MAGNESIUM: MAGNESIUM: 1.9 mg/dL (ref 1.5–2.5)

## 2017-03-27 LAB — HEMOGLOBIN A1C
EAG (MMOL/L): 8.5 (calc)
HEMOGLOBIN A1C: 7 %{Hb} — AB (ref <5.7)
MEAN PLASMA GLUCOSE: 154 (calc)

## 2017-03-27 LAB — BASIC METABOLIC PANEL WITH GFR
BUN: 18 mg/dL (ref 7–25)
CALCIUM: 9.5 mg/dL (ref 8.6–10.3)
CO2: 27 mmol/L (ref 20–32)
Chloride: 100 mmol/L (ref 98–110)
Creat: 1.03 mg/dL (ref 0.70–1.25)
GFR, EST AFRICAN AMERICAN: 90 mL/min/{1.73_m2} (ref >=60)
GFR, EST NON AFRICAN AMERICAN: 77 mL/min/{1.73_m2} (ref >=60)
Glucose, Bld: 125 mg/dL — ABNORMAL HIGH (ref 65–99)
POTASSIUM: 4.2 mmol/L (ref 3.5–5.3)
SODIUM: 137 mmol/L (ref 135–146)

## 2017-03-27 LAB — CBC WITH DIFFERENTIAL/PLATELET
BASOS PCT: 0.3 %
Basophils Absolute: 21 cells/uL (ref 0–200)
Eosinophils Absolute: 147 cells/uL (ref 15–500)
Eosinophils Relative: 2.1 %
HCT: 42.2 % (ref 38.5–50.0)
Hemoglobin: 14.7 g/dL (ref 13.2–17.1)
Lymphs Abs: 2807 cells/uL (ref 850–3900)
MCH: 29.5 pg (ref 27.0–33.0)
MCHC: 34.8 g/dL (ref 32.0–36.0)
MCV: 84.6 fL (ref 80.0–100.0)
MONOS PCT: 6 %
MPV: 10 fL (ref 7.5–12.5)
NEUTROS ABS: 3605 {cells}/uL (ref 1500–7800)
Neutrophils Relative %: 51.5 %
PLATELETS: 211 10*3/uL (ref 140–400)
RBC: 4.99 10*6/uL (ref 4.20–5.80)
RDW: 12.9 % (ref 11.0–15.0)
TOTAL LYMPHOCYTE: 40.1 %
WBC: 7 10*3/uL (ref 3.8–10.8)
WBCMIX: 420 {cells}/uL (ref 200–950)

## 2017-03-27 LAB — TESTOSTERONE: TESTOSTERONE: 164 ng/dL — AB (ref 250–827)

## 2017-03-27 LAB — HEPATIC FUNCTION PANEL
AG Ratio: 1.7 (calc) (ref 1.0–2.5)
ALT: 28 U/L (ref 9–46)
AST: 21 U/L (ref 10–35)
Albumin: 4.6 g/dL (ref 3.6–5.1)
Alkaline phosphatase (APISO): 52 U/L (ref 40–115)
BILIRUBIN DIRECT: 0.1 mg/dL (ref 0.0–0.2)
BILIRUBIN INDIRECT: 0.8 mg/dL (ref 0.2–1.2)
BILIRUBIN TOTAL: 0.9 mg/dL (ref 0.2–1.2)
Globulin: 2.7 g/dL (calc) (ref 1.9–3.7)
Total Protein: 7.3 g/dL (ref 6.1–8.1)

## 2017-03-27 LAB — VITAMIN D 25 HYDROXY (VIT D DEFICIENCY, FRACTURES): Vit D, 25-Hydroxy: 31 ng/mL (ref 30–100)

## 2017-03-27 LAB — LIPID PANEL
Cholesterol: 276 mg/dL — ABNORMAL HIGH (ref <200)
HDL: 39 mg/dL — ABNORMAL LOW (ref >40)
NON-HDL CHOLESTEROL (CALC): 237 mg/dL — AB (ref <130)
TRIGLYCERIDES: 621 mg/dL — AB (ref <150)
Total CHOL/HDL Ratio: 7.1 (calc) — ABNORMAL HIGH (ref <5.0)

## 2017-03-27 LAB — MICROALBUMIN / CREATININE URINE RATIO
CREATININE, URINE: 120 mg/dL (ref 20–320)
MICROALB/CREAT RATIO: 6 ug/mg{creat} (ref <30)
Microalb, Ur: 0.7 mg/dL

## 2017-03-27 LAB — IRON, TOTAL/TOTAL IRON BINDING CAP
%SAT: 31 % (calc) (ref 15–60)
IRON: 124 ug/dL (ref 50–180)
TIBC: 396 mcg/dL (calc) (ref 250–425)

## 2017-03-27 LAB — TSH: TSH: 2.03 mIU/L (ref 0.40–4.50)

## 2017-03-27 LAB — NO CULTURE INDICATED

## 2017-04-04 MED FILL — SYNJARDY XR 25-1000 MG TB24: 25-1000 | 30 days supply | Qty: 30 | Fill #0

## 2017-04-10 ENCOUNTER — Other Ambulatory Visit: Payer: Self-pay | Admitting: Physician Assistant

## 2017-04-10 MED FILL — CITALOPRAM HBR 40 MG TABLET: 40 | 90 days supply | Qty: 90 | Fill #0

## 2017-04-19 ENCOUNTER — Other Ambulatory Visit: Payer: Self-pay | Admitting: Internal Medicine

## 2017-04-19 MED FILL — CITALOPRAM HBR 40 MG TABLET: 40 | 90 days supply | Qty: 90 | Fill #1

## 2017-04-19 MED FILL — SYNJARDY XR 25-1000 MG TB24: 25-1000 | 30 days supply | Qty: 30 | Fill #1

## 2017-04-19 MED FILL — EZETIMIBE 10 MG TABS: 10 | 30 days supply | Qty: 30 | Fill #1

## 2017-04-19 MED FILL — LOSARTAN POTASSIUM 50 MG TA: 50 | 90 days supply | Qty: 90 | Fill #1

## 2017-04-22 ENCOUNTER — Other Ambulatory Visit: Payer: Self-pay

## 2017-06-03 MED FILL — EZETIMIBE 10 MG TABS: 10 | 90 days supply | Qty: 90 | Fill #2

## 2017-06-13 MED FILL — SYNJARDY XR 25-1000 MG TB24: 25-1000 | 30 days supply | Qty: 30 | Fill #2

## 2017-06-17 ENCOUNTER — Ambulatory Visit: Payer: Self-pay | Admitting: Physician Assistant

## 2017-07-05 MED FILL — OMEPRAZOLE 20 MG CAP: 20 | 90 days supply | Qty: 90 | Fill #0
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
## 2017-07-11 ENCOUNTER — Ambulatory Visit (INDEPENDENT_AMBULATORY_CARE_PROVIDER_SITE_OTHER): Payer: 59 | Admitting: Physician Assistant

## 2017-07-11 ENCOUNTER — Encounter: Payer: Self-pay | Admitting: Physician Assistant

## 2017-07-11 VITALS — BP 120/80 | HR 73 | Temp 97.7°F | Resp 14 | Ht 72.0 in | Wt 241.0 lb

## 2017-07-11 DIAGNOSIS — Z79899 Other long term (current) drug therapy: Secondary | ICD-10-CM

## 2017-07-11 DIAGNOSIS — E785 Hyperlipidemia, unspecified: Secondary | ICD-10-CM | POA: Diagnosis not present

## 2017-07-11 DIAGNOSIS — E1142 Type 2 diabetes mellitus with diabetic polyneuropathy: Secondary | ICD-10-CM

## 2017-07-11 DIAGNOSIS — I10 Essential (primary) hypertension: Secondary | ICD-10-CM

## 2017-07-11 MED ORDER — ROPINIROLE HCL 0.5 MG PO TABS: ORAL_TABLET | ORAL | 0 refills | Status: DC

## 2017-07-11 MED FILL — rOPINIRole HCL 0.5 MG TABS: 0.5 | 22 days supply | Qty: 90 | Fill #0

## 2017-07-11 NOTE — Patient Instructions (Addendum)
GO SEE AN EYE DOCTOR  You can try some of the at home treatments for Restless legs and we will check some labs on you looking for deficiencies that could contribute to it however we will have you start on :   Requip 0.25mg - start this dose about 1-2 hours BEFORE restless symptoms start.  The dose may be increased by 0.25 mg every two to three days until relief is obtained. Most patients require at least 2 mg, and doses up to 4 mg may be needed. Common adverse side effects: usually mild, transient, and limited to nausea, lightheadedness, and fatigue; these usually resolve within 10 to 14 days. Less frequent side effects include nasal stuffiness, constipation, and leg edema; these are reversible if the medication is stopped.   Restless Legs Syndrome Restless legs syndrome is a movement disorder. It may also be called a sensorimotor disorder.  CAUSES  No one knows what specifically causes restless legs syndrome, but it tends to run in families. It is also more common in people with low iron, in pregnancy, in people who need dialysis, and those with nerve damage (neuropathy).Some medications may make restless legs syndrome worse.Those medications include drugs to treat high blood pressure, some heart conditions, nausea, colds, allergies, and depression. SYMPTOMS Symptoms include uncomfortable sensations in the legs. These leg sensations are worse during periods of inactivity or rest. They are also worse while sitting or lying down. Individuals that have the disorder describe sensations in the legs that feel like:  Pulling.  Drawing.  Crawling.  Worming.  Boring.  Tingling.  Pins and needles.  Prickling.  Pain. The sensations are usually accompanied by an overwhelming urge to move the legs. Sudden muscle jerks may also occur. Movement provides temporary relief from the discomfort. In rare cases, the arms may also be affected. Symptoms may interfere with going to sleep (sleep onset  insomnia). Restless legs syndrome may also be related to periodic limb movement disorder (PLMD). PLMD is another more common motor disorder. It also causes interrupted sleep. The symptoms from PLMD usually occur most often when you are awake. TREATMENT  Treatment for restless legs syndrome is symptomatic. This means that the symptoms are treated.   Massage and cold compresses may provide temporary relief.  Walk, stretch, or take a cold or hot bath.  Get regular exercise and a good night's sleep.  Avoid caffeine, alcohol, nicotine, and medications that can make it worse.  Do activities that provide mental stimulation like discussions, needlework, and video games. These may be helpful if you are not able to walk or stretch. Some medications are effective in relieving the symptoms. However, many of these medications have side effects. Ask your caregiver about medications that may help your symptoms. Correcting iron deficiency may improve symptoms for some patients. Document Released: 06/15/2002 Document Revised: 11/09/2013 Document Reviewed: 09/21/2010 [facility] Patient Information 2015 [place], [place]. This information is not intended to replace advice given to you by your health care provider. Make sure you discuss any questions you have with your health care provider.  Follow up with dermatology

## 2017-07-12 LAB — CBC WITH DIFFERENTIAL/PLATELET
BASOS ABS: 50 {cells}/uL (ref 0–200)
Basophils Relative: 0.9 %
EOS PCT: 4.2 %
Eosinophils Absolute: 231 cells/uL (ref 15–500)
HEMATOCRIT: 45.7 % (ref 38.5–50.0)
Hemoglobin: 15.5 g/dL (ref 13.2–17.1)
LYMPHS ABS: 2387 {cells}/uL (ref 850–3900)
MCH: 28.9 pg (ref 27.0–33.0)
MCHC: 33.9 g/dL (ref 32.0–36.0)
MCV: 85.3 fL (ref 80.0–100.0)
MONOS PCT: 6.5 %
MPV: 9.9 fL (ref 7.5–12.5)
Neutro Abs: 2475 cells/uL (ref 1500–7800)
Neutrophils Relative %: 45 %
PLATELETS: 195 10*3/uL (ref 140–400)
RBC: 5.36 10*6/uL (ref 4.20–5.80)
RDW: 12.5 % (ref 11.0–15.0)
TOTAL LYMPHOCYTE: 43.4 %
WBC mixed population: 358 cells/uL (ref 200–950)
WBC: 5.5 10*3/uL (ref 3.8–10.8)

## 2017-07-12 LAB — BASIC METABOLIC PANEL WITH GFR
BUN: 15 mg/dL (ref 7–25)
CALCIUM: 9.3 mg/dL (ref 8.6–10.3)
CHLORIDE: 99 mmol/L (ref 98–110)
CO2: 28 mmol/L (ref 20–32)
Creat: 1.05 mg/dL (ref 0.70–1.25)
GFR, EST NON AFRICAN AMERICAN: 76 mL/min/{1.73_m2} (ref >=60)
GFR, Est African American: 88 mL/min/{1.73_m2} (ref >=60)
GLUCOSE: 248 mg/dL — AB (ref 65–99)
POTASSIUM: 4.1 mmol/L (ref 3.5–5.3)
SODIUM: 136 mmol/L (ref 135–146)

## 2017-07-12 LAB — LIPID PANEL
CHOLESTEROL: 225 mg/dL — AB (ref <200)
HDL: 41 mg/dL (ref >40)
LDL Cholesterol (Calc): 137 mg/dL (calc) — ABNORMAL HIGH
Non-HDL Cholesterol (Calc): 184 mg/dL (calc) — ABNORMAL HIGH (ref <130)
Total CHOL/HDL Ratio: 5.5 (calc) — ABNORMAL HIGH (ref <5.0)
Triglycerides: 326 mg/dL — ABNORMAL HIGH (ref <150)

## 2017-07-12 LAB — HEMOGLOBIN A1C
Hgb A1c MFr Bld: 7.7 % of total Hgb — ABNORMAL HIGH (ref <5.7)
Mean Plasma Glucose: 174 (calc)
eAG (mmol/L): 9.7 (calc)

## 2017-07-12 LAB — TSH: TSH: 2.5 m[IU]/L (ref 0.40–4.50)

## 2017-07-12 LAB — HEPATIC FUNCTION PANEL
AG Ratio: 1.7 (calc) (ref 1.0–2.5)
ALKALINE PHOSPHATASE (APISO): 49 U/L (ref 40–115)
ALT: 36 U/L (ref 9–46)
AST: 26 U/L (ref 10–35)
Albumin: 4.5 g/dL (ref 3.6–5.1)
BILIRUBIN TOTAL: 0.8 mg/dL (ref 0.2–1.2)
Bilirubin, Direct: 0.1 mg/dL (ref 0.0–0.2)
Globulin: 2.6 g/dL (calc) (ref 1.9–3.7)
Indirect Bilirubin: 0.7 mg/dL (calc) (ref 0.2–1.2)
Total Protein: 7.1 g/dL (ref 6.1–8.1)

## 2017-07-12 LAB — MAGNESIUM: Magnesium: 1.9 mg/dL (ref 1.5–2.5)

## 2017-07-18 MED FILL — SYNJARDY XR 25-1000 MG TB24: 25-1000 | 30 days supply | Qty: 30 | Fill #3

## 2017-08-15 ENCOUNTER — Other Ambulatory Visit: Payer: Self-pay | Admitting: Physician Assistant

## 2017-08-15 MED FILL — SYNJARDY XR 25-1000 MG TB24: 25-1000 | 30 days supply | Qty: 30 | Fill #0

## 2017-09-09 MED FILL — EZETIMIBE 10 MG TABS: 10 | 90 days supply | Qty: 90 | Fill #3

## 2017-09-16 ENCOUNTER — Other Ambulatory Visit: Payer: Self-pay | Admitting: Physician Assistant

## 2017-09-16 MED FILL — SYNJARDY XR 25-1000 MG TB24: 25-1000 | 30 days supply | Qty: 30 | Fill #1

## 2017-09-16 MED FILL — rOPINIRole HCL 0.5 MG TABS: 0.5 | 22 days supply | Qty: 90 | Fill #0

## 2017-10-04 MED FILL — LOSARTAN POTASSIUM 50 MG TA: 50 | 90 days supply | Qty: 90 | Fill #2
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
## 2017-10-14 ENCOUNTER — Ambulatory Visit: Payer: Self-pay | Admitting: Physician Assistant

## 2017-10-15 ENCOUNTER — Encounter: Payer: Self-pay | Admitting: Physician Assistant

## 2017-10-15 ENCOUNTER — Ambulatory Visit: Payer: 59 | Admitting: Physician Assistant

## 2017-10-15 VITALS — BP 136/70 | HR 76 | Temp 97.6°F | Resp 16 | Ht 72.0 in | Wt 236.8 lb

## 2017-10-15 DIAGNOSIS — G4733 Obstructive sleep apnea (adult) (pediatric): Secondary | ICD-10-CM

## 2017-10-15 DIAGNOSIS — G2581 Restless legs syndrome: Secondary | ICD-10-CM

## 2017-10-15 DIAGNOSIS — E785 Hyperlipidemia, unspecified: Secondary | ICD-10-CM

## 2017-10-15 DIAGNOSIS — I10 Essential (primary) hypertension: Secondary | ICD-10-CM

## 2017-10-15 DIAGNOSIS — E1142 Type 2 diabetes mellitus with diabetic polyneuropathy: Secondary | ICD-10-CM

## 2017-10-15 DIAGNOSIS — Z9989 Dependence on other enabling machines and devices: Secondary | ICD-10-CM

## 2017-10-15 NOTE — Patient Instructions (Addendum)
Find an eye doctor  Try requip up to 4 pills at night for 3 night or 2mg  at night  If this does not help then we will call in gabapentin Gabapentin is for nerve pain/sleep/RLS Depending on the person, someone can metabolize the medication in 1-3 hours, so you need to play with the timing of the medication, Start with 1 tab at night, can go up to 3 pills a night   Diabetes is a very complicated disease...lets simplify it.  An easy way to look at it to understand the complications is if you think of the extra sugar floating in your blood stream as glass shards floating through your blood stream.    Diabetes affects your small vessels first: 1) The glass shards (sugar) scraps down the tiny blood vessels in your eyes and lead to diabetic retinopathy, the leading cause of blindness in the [place]. Diabetes is the leading cause of newly diagnosed adult (51 to 63 years of age) blindness in the [place].  2) The glass shards scratches down the tiny vessels of your legs leading to nerve damage called neuropathy and can lead to amputations of your feet. More than 60% of all non-traumatic amputations of lower limbs occur in people with diabetes.  3) Over time the small vessels in your brain are shredded and closed off, individually this does not cause any problems but over a long period of time many of the small vessels being blocked can lead to Vascular Dementia.   4) Your kidney's are a filter system and have a "net" that keeps certain things in the body and lets bad things out. Sugar shreds this net and leads to kidney damage and eventually failure. Decreasing the sugar that is destroying the net and certain blood pressure medications can help stop or decrease progression of kidney disease. Diabetes was the primary cause of kidney failure in 44 percent of all new cases in 2011.  5) Diabetes also destroys the small vessels in your penis that lead to erectile dysfunction. Eventually the vessels are so  damaged that you may not be responsive to cialis or viagra.   Diabetes and your large vessels: Your larger vessels consist of your coronary arteries in your heart and the carotid vessels to your brain. Diabetes or even increased sugars put you at 300% increased risk of heart attack and stroke and this is why.. The sugar scrapes down your large blood vessels and your body sees this as an internal injury and tries to repair itself. Just like you get a scab on your skin, your platelets will stick to the blood vessel wall trying to heal it. This is why we have diabetics on low dose aspirin daily, this prevents the platelets from sticking and can prevent plaque formation. In addition, your body takes cholesterol and tries to shove it into the open wound. This is why we want your LDL, or bad cholesterol, below 70.   The combination of platelets and cholesterol over 5-10 years forms plaque that can break off and cause a heart attack or stroke.   PLEASE REMEMBER:  Diabetes is preventable! Up to 59 percent of complications and morbidities among individuals with type 2 diabetes can be prevented, delayed, or effectively treated and minimized with regular visits to a health professional, appropriate monitoring and medication, and a healthy diet and lifestyle.   Diabetes or even increased sugars put you at 300% increased risk of heart attack and stroke.  ALSO BEING DIABETIC YOU MAY NOT  HAVE ANY PAIN WITH A HEART ATTACK.  Even worse of a chance of no pain if you are a woman.  It is very unlikely that you will have any pain with a heart attack. Likely your symptoms will be very subtle, even for very severe disease.  Your symptoms for a heart attack will likely occur when you exert your self or exercise and include: Shortness of breath Sweating Nausea Dizziness Fast or irregular heart beats Fatigue   It makes me feel better if my diabetics get their heart rate up with exercise once or twice a week and pay  close attention to your body. If there is ANY change in your exercise capacity or if you have symptoms above, please STOP and call 911 or call to come to the office.   PLEASE REMEMBER:  Diabetes is preventable! Up to 25 percent of complications and morbidities among individuals with type 2 diabetes can be prevented, delayed, or effectively treated and minimized with regular visits to a health professional, appropriate monitoring and medication, and a healthy diet and lifestyle.   Here is some information to help you keep your heart healthy: Move it! - Aim for 30 mins of activity every day. Take it slowly at first. Talk to [place] before starting any new exercise program.   Lose it.  -Body Mass Index (BMI) can indicate if you need to lose weight. A healthy range is 18.5-24.9. For a BMI calculator, go to [employer]  Waist Management -Excess abdominal fat is a risk factor for heart disease, diabetes, asthma, stroke and more. Ideal waist circumference is less than 35" for women and less than 40" for men.   Eat Right -focus on fruits, vegetables, whole grains, and meals you make yourself. Avoid foods with trans fat and high sugar/sodium content.   Snooze or Snore? - Loud snoring can be a sign of sleep apnea, a significant risk factor for high blood pressure, heart attach, stroke, and heart arrhythmias.  Kick the habit -Quit Smoking! Avoid second hand smoke. A single cigarette raises your blood pressure for 20 mins and increases the risk of heart attack and stroke for the next 24 hours.   Are Aspirin and Supplements right for you? -Add ENTERIC COATED low dose 81 mg Aspirin daily OR can do every other day if you have easy bruising to protect your heart and head. As well as to reduce risk of Colon Cancer by 20 %, Skin Cancer by 26 % , Melanoma by 46% and Pancreatic cancer by 60%  Say "No to Stress -There may be little you can do about problems that cause stress. However, techniques such as long  walks, meditation, and exercise can help you manage it.   Start Now! - Make changes one at a time and set reasonable goals to increase your likelihood of success.        When it comes to diets, agreement about the perfect plan isn't easy to find, even among the experts. Experts at the [employer] developed an idea known as the Healthy Eating Plate. Just imagine a plate divided into logical, healthy portions.  The emphasis is on diet quality:  Load up on vegetables and fruits - one-half of your plate: Aim for color and variety, and remember that potatoes don't count.  Go for whole grains - one-quarter of your plate: Whole wheat, barley, wheat berries, quinoa, oats, brown rice, and foods made with them. If you want pasta, go with whole wheat pasta.  Protein power - one-quarter of your plate: Fish, chicken, beans, and nuts are all healthy, versatile protein sources. Limit red meat.  The diet, however, does go beyond the plate, offering a few other suggestions.  Use healthy plant oils, such as olive, canola, soy, corn, sunflower and peanut. Check the labels, and avoid partially hydrogenated oil, which have unhealthy trans fats.  If you're thirsty, drink water. Coffee and tea are good in moderation, but skip sugary drinks and limit milk and dairy products to one or two daily servings.  The type of carbohydrate in the diet is more important than the amount. Some sources of carbohydrates, such as vegetables, fruits, whole grains, and beans-are healthier than others.  Finally, stay active.

## 2017-10-16 LAB — HEMOGLOBIN A1C
EAG (MMOL/L): 9 (calc)
HEMOGLOBIN A1C: 7.3 %{Hb} — AB (ref <5.7)
Mean Plasma Glucose: 163 (calc)

## 2017-10-16 LAB — BASIC METABOLIC PANEL WITH GFR
BUN: 17 mg/dL (ref 7–25)
CO2: 29 mmol/L (ref 20–32)
CREATININE: 0.91 mg/dL (ref 0.70–1.25)
Calcium: 9.5 mg/dL (ref 8.6–10.3)
Chloride: 102 mmol/L (ref 98–110)
GFR, EST AFRICAN AMERICAN: 104 mL/min/{1.73_m2} (ref >=60)
GFR, EST NON AFRICAN AMERICAN: 90 mL/min/{1.73_m2} (ref >=60)
Glucose, Bld: 100 mg/dL — ABNORMAL HIGH (ref 65–99)
POTASSIUM: 4.3 mmol/L (ref 3.5–5.3)
SODIUM: 138 mmol/L (ref 135–146)

## 2017-10-16 LAB — CBC WITH DIFFERENTIAL/PLATELET
BASOS ABS: 40 {cells}/uL (ref 0–200)
Basophils Relative: 0.6 %
EOS PCT: 3.1 %
Eosinophils Absolute: 205 cells/uL (ref 15–500)
HEMATOCRIT: 42.5 % (ref 38.5–50.0)
HEMOGLOBIN: 15.2 g/dL (ref 13.2–17.1)
LYMPHS ABS: 2732 {cells}/uL (ref 850–3900)
MCH: 29.9 pg (ref 27.0–33.0)
MCHC: 35.8 g/dL (ref 32.0–36.0)
MCV: 83.5 fL (ref 80.0–100.0)
MPV: 9.8 fL (ref 7.5–12.5)
Monocytes Relative: 6.9 %
NEUTROS ABS: 3168 {cells}/uL (ref 1500–7800)
NEUTROS PCT: 48 %
Platelets: 192 10*3/uL (ref 140–400)
RBC: 5.09 10*6/uL (ref 4.20–5.80)
RDW: 12.8 % (ref 11.0–15.0)
Total Lymphocyte: 41.4 %
WBC: 6.6 10*3/uL (ref 3.8–10.8)
WBCMIX: 455 {cells}/uL (ref 200–950)

## 2017-10-16 LAB — LIPID PANEL
CHOL/HDL RATIO: 5.9 (calc) — AB (ref <5.0)
Cholesterol: 237 mg/dL — ABNORMAL HIGH (ref <200)
HDL: 40 mg/dL — AB (ref >40)
LDL Cholesterol (Calc): 148 mg/dL (calc) — ABNORMAL HIGH
Non-HDL Cholesterol (Calc): 197 mg/dL (calc) — ABNORMAL HIGH (ref <130)
Triglycerides: 317 mg/dL — ABNORMAL HIGH (ref <150)

## 2017-10-16 LAB — HEPATIC FUNCTION PANEL
AG RATIO: 2.1 (calc) (ref 1.0–2.5)
ALKALINE PHOSPHATASE (APISO): 59 U/L (ref 40–115)
ALT: 47 U/L — AB (ref 9–46)
AST: 27 U/L (ref 10–35)
Albumin: 4.7 g/dL (ref 3.6–5.1)
BILIRUBIN TOTAL: 0.9 mg/dL (ref 0.2–1.2)
Bilirubin, Direct: 0.2 mg/dL (ref 0.0–0.2)
Globulin: 2.2 g/dL (calc) (ref 1.9–3.7)
Indirect Bilirubin: 0.7 mg/dL (calc) (ref 0.2–1.2)
Total Protein: 6.9 g/dL (ref 6.1–8.1)

## 2017-10-16 LAB — TSH: TSH: 2.3 mIU/L (ref 0.40–4.50)

## 2017-10-16 MED FILL — SYNJARDY XR 25-1000 MG TB24: 25-1000 | 30 days supply | Qty: 30 | Fill #2

## 2017-10-16 MED FILL — OMEPRAZOLE 20 MG CAP: 20 | 90 days supply | Qty: 90 | Fill #1

## 2017-10-22 ENCOUNTER — Other Ambulatory Visit: Payer: Self-pay | Admitting: Physician Assistant

## 2017-10-22 MED ORDER — GABAPENTIN 300 MG PO CAPS: ORAL_CAPSULE | ORAL | 0 refills | Status: DC

## 2017-10-22 MED FILL — GABAPENTIN 300 MG CAPSULE: 300 | 30 days supply | Qty: 90 | Fill #0

## 2017-10-31 ENCOUNTER — Other Ambulatory Visit: Payer: Self-pay | Admitting: Physician Assistant

## 2017-10-31 MED FILL — CITALOPRAM HBR 40 MG TABLET: 40 | 90 days supply | Qty: 90 | Fill #0

## 2017-11-15 MED FILL — SYNJARDY XR 25-1000 MG TB24: 25-1000 | 30 days supply | Qty: 30 | Fill #3

## 2017-12-17 ENCOUNTER — Other Ambulatory Visit: Payer: Self-pay | Admitting: Physician Assistant

## 2017-12-17 ENCOUNTER — Other Ambulatory Visit: Payer: Self-pay | Admitting: Adult Health

## 2017-12-17 MED FILL — SYNJARDY XR 25-1000 MG TB24: 25-1000 | 30 days supply | Qty: 30 | Fill #0

## 2017-12-17 MED FILL — EZETIMIBE 10 MG TABLET: 10 | 90 days supply | Qty: 90 | Fill #4

## 2017-12-17 MED FILL — GABAPENTIN 300 MG CAPSULE: 300 | 30 days supply | Qty: 90 | Fill #0

## 2018-01-03 MED FILL — LOSARTAN POTASSIUM 50 MG TA: 50 | 90 days supply | Qty: 90 | Fill #3

## 2018-01-13 DIAGNOSIS — E119 Type 2 diabetes mellitus without complications: Secondary | ICD-10-CM | POA: Insufficient documentation

## 2018-01-13 NOTE — Progress Notes (Signed)
FOLLOW UP  Assessment and Plan:   Hypertension Well controlled with current medications  Monitor blood pressure at home; patient to call if consistently greater than 130/80 Continue DASH diet.   Reminder to go to the ER if any CP, SOB, nausea, dizziness, severe HA, changes vision/speech, left arm numbness and tingling and jaw pain.  Cholesterol Currently above goal; statin intolerant; continue zetia; diet emphasized Continue low cholesterol diet and exercise.  Check lipid panel.   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  Perform daily foot/skin check, notify office of any concerning changes.  Check A1C  Obesity with co morbidities Long discussion about weight loss, diet, and exercise Recommended diet heavy in fruits and veggies and low in animal meats, cheeses, and dairy products, appropriate calorie intake Discussed ideal weight for height and initial weight goal (235 lb) Patient will work on cutting down on soft drinks Will follow up in 3 months  Vitamin D Def Below goal at last check;  He has not initiated supplementation Defer Vit D level Recommended to start on 5000 IU daily  Continue diet and meds as discussed. Further disposition pending results of labs. Discussed med's effects and SE's.   Over 30 minutes of exam, counseling, chart review, and critical decision making was performed.   Future Appointments  Date Time Provider [place]  04/17/2018 11:00 AM Vicie Mutters, PA-C [facility] None    ----------------------------------------------------------------------------------------------------------------------  HPI 62 y.o. male  presents for 3 month follow up on hypertension, cholesterol, diabetes, obesity and vitamin D deficiency.   BMI is Body mass index is 32.82 kg/m., he has not been working on diet and exercise. Wt Readings from Last 3 Encounters:   01/15/18 242 lb (109.8 kg)  10/15/17 236 lb 12.8 oz (107.4 kg)  07/11/17 241 lb (109.3 kg)   His blood pressure has been controlled at home, today their BP is BP: 124/78  He does not workout. He denies chest pain, shortness of breath, dizziness.   He is on cholesterol medication (zetia due to statin intolerance) and denies myalgias. His cholesterol is not at goal. The cholesterol last visit was:   Lab Results  Component Value Date   CHOL 237 (H) 10/15/2017   HDL 40 (L) 10/15/2017   LDLCALC 148 (H) 10/15/2017   TRIG 317 (H) 10/15/2017   CHOLHDL 5.9 (H) 10/15/2017    He has not been working on diet and exercise for T2 diabetes (on synjardy), and denies foot ulcerations, increased appetite, nausea, paresthesia of the feet, polydipsia, polyuria, visual disturbances, vomiting and weight loss. He does have a meter but does not regularly check sugars. Last A1C in the office was:  Lab Results  Component Value Date   HGBA1C 7.3 (H) 10/15/2017   Patient is on Vitamin D supplement.   Lab Results  Component Value Date   VD25OH 31 03/26/2017       Current Medications:  Current Outpatient Medications on File Prior to Visit  Medication Sig  . aspirin 81 MG tablet Take 81 mg by mouth daily.  . citalopram (CELEXA) 40 MG tablet TAKE 1 TABLET (40 MG TOTAL) BY MOUTH DAILY.  Marland Kitchen ezetimibe (ZETIA) 10 MG tablet Take 1 tablet (10 mg total) by mouth daily.  Marland Kitchen gabapentin (NEURONTIN) 300 MG capsule TAKE 1 TO 3 CAPSULES BY MOUTH AT NIGHT FOR RESTLESS LEG  . losartan (COZAAR) 50 MG tablet Take 1 tablet (50 mg total) by mouth daily.  Marland Kitchen  Magnesium 100 MG CAPS Take by mouth.  . Multiple Vitamins-Minerals (ZINC PO) Take by mouth.  Marland Kitchen omeprazole (PRILOSEC) 20 MG capsule TAKE 1 CAPSULE BY MOUTH ONCE DAILY  . SYNJARDY XR 25-1000 MG TB24 TAKE 1 TABLET BY MOUTH ONCE DAILY  . TURMERIC PO Take by mouth.   No current facility-administered medications on file prior to visit.      Allergies:  Allergies  Allergen  Reactions  . Caduet [Amlodipine-Atorvastatin]   . Statins     BLURRED VISION     Medical History:  Past Medical History:  Diagnosis Date  . Cancer ([place])   . Diabetes mellitus without complication ([place])   . GERD (gastroesophageal reflux disease)   . Hyperlipidemia   . Hypertension   . OSA on CPAP 08/03/2013  . Unspecified vitamin D deficiency    Family history- Reviewed and unchanged Social history- Reviewed and unchanged   Review of Systems:  Review of Systems  Constitutional: Negative for malaise/fatigue and weight loss.  HENT: Negative for hearing loss and tinnitus.   Eyes: Negative for blurred vision and double vision.  Respiratory: Negative for cough, shortness of breath and wheezing.   Cardiovascular: Negative for chest pain, palpitations, orthopnea, claudication and leg swelling.  Gastrointestinal: Negative for abdominal pain, blood in stool, constipation, diarrhea, heartburn, melena, nausea and vomiting.  Genitourinary: Negative.   Musculoskeletal: Negative for joint pain and myalgias.  Skin: Negative for rash.  Neurological: Negative for dizziness, tingling, sensory change, weakness and headaches.  Endo/Heme/Allergies: Negative for polydipsia.  Psychiatric/Behavioral: Negative.   All other systems reviewed and are negative.     Physical Exam: BP 124/78   Pulse 66   Temp (!) 97.3 F (36.3 C)   Ht 6' (1.829 m)   Wt 242 lb (109.8 kg)   SpO2 97%   BMI 32.82 kg/m  Wt Readings from Last 3 Encounters:  01/15/18 242 lb (109.8 kg)  10/15/17 236 lb 12.8 oz (107.4 kg)  07/11/17 241 lb (109.3 kg)   General Appearance: Well nourished, in no apparent distress. Eyes: PERRLA, EOMs, conjunctiva no swelling or erythema Sinuses: No Frontal/maxillary tenderness ENT/Mouth: Ext aud canals clear, TMs without erythema, bulging. No erythema, swelling, or exudate on post pharynx.  Tonsils not swollen or erythematous. Hearing normal.  Neck: Supple, thyroid normal.   Respiratory: Respiratory effort normal, BS equal bilaterally without rales, rhonchi, wheezing or stridor.  Cardio: RRR with no MRGs. Brisk peripheral pulses without edema.  Abdomen: Soft, + BS.  Non tender, no guarding, rebound, hernias, masses. Lymphatics: Non tender without lymphadenopathy.  Musculoskeletal: Full ROM, 5/5 strength, Normal gait Skin: Warm, dry without rashes, lesions, ecchymosis.  Neuro: Cranial nerves intact. No cerebellar symptoms.  Psych: Awake and oriented X 3, normal affect, Insight and Judgment appropriate.    Izora Ribas, NP 8:56 AM [facility]

## 2018-01-15 ENCOUNTER — Ambulatory Visit: Payer: 59 | Admitting: Adult Health

## 2018-01-15 ENCOUNTER — Encounter: Payer: Self-pay | Admitting: Adult Health

## 2018-01-15 VITALS — BP 124/78 | HR 66 | Temp 97.3°F | Ht 72.0 in | Wt 242.0 lb

## 2018-01-15 DIAGNOSIS — Z79899 Other long term (current) drug therapy: Secondary | ICD-10-CM

## 2018-01-15 DIAGNOSIS — E669 Obesity, unspecified: Secondary | ICD-10-CM | POA: Diagnosis not present

## 2018-01-15 DIAGNOSIS — K219 Gastro-esophageal reflux disease without esophagitis: Secondary | ICD-10-CM

## 2018-01-15 DIAGNOSIS — I10 Essential (primary) hypertension: Secondary | ICD-10-CM

## 2018-01-15 DIAGNOSIS — E785 Hyperlipidemia, unspecified: Secondary | ICD-10-CM | POA: Diagnosis not present

## 2018-01-15 DIAGNOSIS — E559 Vitamin D deficiency, unspecified: Secondary | ICD-10-CM | POA: Diagnosis not present

## 2018-01-15 DIAGNOSIS — N181 Chronic kidney disease, stage 1: Secondary | ICD-10-CM

## 2018-01-15 DIAGNOSIS — E1122 Type 2 diabetes mellitus with diabetic chronic kidney disease: Secondary | ICD-10-CM

## 2018-01-15 NOTE — Patient Instructions (Signed)
Weight goal: 235 lb    Aim for 7+ servings of fruits and vegetables daily  80+ fluid ounces of water or unsweet tea for healthy kidneys  Limit animal fats in diet for cholesterol and heart health - choose grass fed whenever available  Aim for low stress - take time to unwind and care for your mental health  Aim for 150 min of moderate intensity exercise weekly for heart health, and weights twice weekly for bone health  Aim for 7-9 hours of sleep daily      When it comes to diets, agreement about the perfect plan isn't easy to find, even among the experts. Experts at the [employer] developed an idea known as the Healthy Eating Plate. Just imagine a plate divided into logical, healthy portions.  The emphasis is on diet quality:  Load up on vegetables and fruits - one-half of your plate: Aim for color and variety, and remember that potatoes don't count.  Go for whole grains - one-quarter of your plate: Whole wheat, barley, wheat berries, quinoa, oats, brown rice, and foods made with them. If you want pasta, go with whole wheat pasta.  Protein power - one-quarter of your plate: Fish, chicken, beans, and nuts are all healthy, versatile protein sources. Limit red meat.  The diet, however, does go beyond the plate, offering a few other suggestions.  Use healthy plant oils, such as olive, canola, soy, corn, sunflower and peanut. Check the labels, and avoid partially hydrogenated oil, which have unhealthy trans fats.  If you're thirsty, drink water. Coffee and tea are good in moderation, but skip sugary drinks and limit milk and dairy products to one or two daily servings.  The type of carbohydrate in the diet is more important than the amount. Some sources of carbohydrates, such as vegetables, fruits, whole grains, and beans-are healthier than others.  Finally, stay active.    Are you an emotional eater? Do you eat more when you're feeling stressed? Do you  eat when you're not hungry or when you're full? Do you eat to feel better (to calm and soothe yourself when you're sad, mad, bored, anxious, etc.)? Do you reward yourself with food? Do you regularly eat until you've stuffed yourself? Does food make you feel safe? Do you feel like food is a friend? Do you feel powerless or out of control around food?  If you answered yes to some of these questions than it is likely that you are an emotional eater. This is normally a learned behavior and can take time to first recognize the signs and second BREAK THE HABIT. But here is more information and tips to help.   The difference between emotional hunger and physical hunger Emotional hunger can be powerful, so it's easy to mistake it for physical hunger. But there are clues you can look for to help you tell physical and emotional hunger apart.  Emotional hunger comes on suddenly. It hits you in an instant and feels overwhelming and urgent. Physical hunger, on the other hand, comes on more gradually. The urge to eat doesn't feel as dire or demand instant satisfaction (unless you haven't eaten for a very long time).  Emotional hunger craves specific comfort foods. When you're physically hungry, almost anything sounds good-including healthy stuff like vegetables. But emotional hunger craves junk food or sugary snacks that provide an instant rush. You feel like you need cheesecake or pizza, and nothing else will do.  Emotional hunger often leads to  mindless eating. Before you know it, you've eaten a whole bag of chips or an entire pint of ice cream without really paying attention or fully enjoying it. When you're eating in response to physical hunger, you're typically more aware of what you're doing.  Emotional hunger isn't satisfied once you're full. You keep wanting more and more, often eating until you're uncomfortably stuffed. Physical hunger, on the other hand, doesn't need to be stuffed. You feel satisfied  when your stomach is full.  Emotional hunger isn't located in the stomach. Rather than a growling belly or a pang in your stomach, you feel your hunger as a craving you can't get out of your head. You're focused on specific textures, tastes, and smells.  Emotional hunger often leads to regret, guilt, or shame. When you eat to satisfy physical hunger, you're unlikely to feel guilty or ashamed because you're simply giving your body what it needs. If you feel guilty after you eat, it's likely because you know deep down that you're not eating for nutritional reasons.  Identify your emotional eating triggers What situations, places, or feelings make you reach for the comfort of food? Most emotional eating is linked to unpleasant feelings, but it can also be triggered by positive emotions, such as rewarding yourself for achieving a goal or celebrating a holiday or happy event. Common causes of emotional eating include:  Stuffing emotions - Eating can be a way to temporarily silence or "stuff down" uncomfortable emotions, including anger, fear, sadness, anxiety, loneliness, resentment, and shame. While you're numbing yourself with food, you can avoid the difficult emotions you'd rather not feel.  Boredom or feelings of emptiness - Do you ever eat simply to give yourself something to do, to relieve boredom, or as a way to fill a void in your life? You feel unfulfilled and empty, and food is a way to occupy your mouth and your time. In the moment, it fills you up and distracts you from underlying feelings of purposelessness and dissatisfaction with your life.  Childhood habits - Think back to your childhood memories of food. Did your parents reward good behavior with ice cream, take you out for pizza when you got a good report card, or serve you sweets when you were feeling sad? These habits can often carry over into adulthood. Or your eating may be driven by nostalgia-for cherished memories of grilling burgers  in the backyard with your dad or baking and eating cookies with your mom.  Social influences - Getting together with other people for a meal is a great way to relieve stress, but it can also lead to overeating. It's easy to overindulge simply because the food is there or because everyone else is eating. You may also overeat in social situations out of nervousness. Or perhaps your family or circle of friends encourages you to overeat, and it's easier to go along with the group.  Stress - Ever notice how stress makes you hungry? It's not just in your mind. When stress is chronic, as it so often is in our chaotic, fast-paced world, your body produces high levels of the stress hormone, cortisol. Cortisol triggers cravings for salty, sweet, and fried foods-foods that give you a burst of energy and pleasure. The more uncontrolled stress in your life, the more likely you are to turn to food for emotional relief.  Find other ways to feed your feelings If you don't know how to manage your emotions in a way that doesn't involve food, you  won't be able to control your eating habits for very long. Diets so often fail because they offer logical nutritional advice which only works if you have conscious control over your eating habits. It doesn't work when emotions hijack the process, demanding an immediate payoff with food.  In order to stop emotional eating, you have to find other ways to fulfill yourself emotionally. It's not enough to understand the cycle of emotional eating or even to understand your triggers, although that's a huge first step. You need alternatives to food that you can turn to for emotional fulfillment.  Alternatives to emotional eating If you're depressed or lonely, call someone who always makes you feel better, play with your dog or cat, or look at a favorite photo or cherished memento.  If you're anxious, expend your nervous energy by dancing to your favorite song, squeezing a stress ball,  or taking a brisk walk.  If you're exhausted, treat yourself with a hot cup of tea, take a bath, light some scented candles, or wrap yourself in a warm blanket.  If you're bored, read a good book, watch a comedy show, explore the outdoors, or turn to an activity you enjoy (woodworking, playing the guitar, shooting hoops, scrapbooking, etc.).  What is mindful eating? Mindful eating is a practice that develops your awareness of eating habits and allows you to pause between your triggers and your actions. Most emotional eaters feel powerless over their food cravings. When the urge to eat hits, you feel an almost unbearable tension that demands to be fed, right now. Because you've tried to resist in the past and failed, you believe that your willpower just isn't up to snuff. But the truth is that you have more power over your cravings than you think.  Take 5 before you give in to a craving Emotional eating tends to be automatic and virtually mindless. Before you even realize what you're doing, you've reached for a tub of ice cream and polished off half of it. But if you can take a moment to pause and reflect when you're hit with a craving, you give yourself the opportunity to make a different decision.  Can you put off eating for five minutes? Or just start with one minute. Don't tell yourself you can't give in to the craving; remember, the forbidden is extremely tempting. Just tell yourself to wait.  While you're waiting, check in with yourself. How are you feeling? What's going on emotionally? Even if you end up eating, you'll have a better understanding of why you did it. This can help you set yourself up for a different response next time.  How to practice mindful eating Eating while you're also doing other things-such as watching TV, driving, or playing with your phone-can prevent you from fully enjoying your food. Since your mind is elsewhere, you may not feel satisfied or continue eating even  though you're no longer hungry. Eating more mindfully can help focus your mind on your food and the pleasure of a meal and curb overeating.   Eat your meals in a calm place with no distractions, aside from any dining companions.  Try eating with your non-dominant hand or using chopsticks instead of a knife and fork. Eating in such a non-familiar way can slow down how fast you eat and ensure your mind stays focused on your food.  Allow yourself enough time not to have to rush your meal. Set a timer for 20 minutes and pace yourself so you spend at  least that much time eating.  Take small bites and chew them well, taking time to notice the different flavors and textures of each mouthful.  Put your utensils down between bites. Take time to consider how you feel-hungry, satiated-before picking up your utensils again.  Try to stop eating before you are full.It takes time for the signal to reach your brain that you've had enough. Don't feel obligated to always clean your plate.  When you've finished your food, take a few moments to assess if you're really still hungry before opting for an extra serving or dessert.  Learn to accept your feelings-even the bad ones  While it may seem that the core problem is that you're powerless over food, emotional eating actually stems from feeling powerless over your emotions. You don't feel capable of dealing with your feelings head on, so you avoid them with food.  Recommended reading  Mini Habits for weight loss  Healthy Eating: A guide to the new nutrition - [employer] Report  10 Tips for Mindful Eating - How mindfulness can help you fully enjoy a meal and the experience of eating-with moderation and restraint. ([place])  Weight Loss: Gain Control of Emotional Eating - Tips to regain control of your eating habits. [facility])  Why Stress Causes People to Overeat -Tips on controlling stress eating. ([place])  Mindful Eating Meditations -Free online mindfulness meditations. ([facility])

## 2018-01-16 LAB — COMPLETE METABOLIC PANEL WITH GFR
AG Ratio: 1.9 (calc) (ref 1.0–2.5)
ALT: 35 U/L (ref 9–46)
AST: 27 U/L (ref 10–35)
Albumin: 4.6 g/dL (ref 3.6–5.1)
Alkaline phosphatase (APISO): 48 U/L (ref 40–115)
BUN: 15 mg/dL (ref 7–25)
CO2: 25 mmol/L (ref 20–32)
CREATININE: 0.98 mg/dL (ref 0.70–1.25)
Calcium: 9.6 mg/dL (ref 8.6–10.3)
Chloride: 101 mmol/L (ref 98–110)
GFR, EST AFRICAN AMERICAN: 95 mL/min/{1.73_m2} (ref >=60)
GFR, EST NON AFRICAN AMERICAN: 82 mL/min/{1.73_m2} (ref >=60)
Globulin: 2.4 g/dL (calc) (ref 1.9–3.7)
Glucose, Bld: 160 mg/dL — ABNORMAL HIGH (ref 65–99)
Potassium: 4.3 mmol/L (ref 3.5–5.3)
Sodium: 137 mmol/L (ref 135–146)
TOTAL PROTEIN: 7 g/dL (ref 6.1–8.1)
Total Bilirubin: 0.9 mg/dL (ref 0.2–1.2)

## 2018-01-16 LAB — CBC WITH DIFFERENTIAL/PLATELET
Basophils Absolute: 30 cells/uL (ref 0–200)
Basophils Relative: 0.5 %
EOS PCT: 3.9 %
Eosinophils Absolute: 234 cells/uL (ref 15–500)
HEMATOCRIT: 43.6 % (ref 38.5–50.0)
HEMOGLOBIN: 15 g/dL (ref 13.2–17.1)
LYMPHS ABS: 2472 {cells}/uL (ref 850–3900)
MCH: 29.4 pg (ref 27.0–33.0)
MCHC: 34.4 g/dL (ref 32.0–36.0)
MCV: 85.3 fL (ref 80.0–100.0)
MPV: 10 fL (ref 7.5–12.5)
Monocytes Relative: 7.2 %
NEUTROS ABS: 2832 {cells}/uL (ref 1500–7800)
Neutrophils Relative %: 47.2 %
Platelets: 200 10*3/uL (ref 140–400)
RBC: 5.11 10*6/uL (ref 4.20–5.80)
RDW: 12.6 % (ref 11.0–15.0)
Total Lymphocyte: 41.2 %
WBC mixed population: 432 cells/uL (ref 200–950)
WBC: 6 10*3/uL (ref 3.8–10.8)

## 2018-01-16 LAB — LIPID PANEL
CHOL/HDL RATIO: 5.3 (calc) — AB (ref <5.0)
Cholesterol: 234 mg/dL — ABNORMAL HIGH (ref <200)
HDL: 44 mg/dL (ref >40)
LDL CHOLESTEROL (CALC): 150 mg/dL — AB
NON-HDL CHOLESTEROL (CALC): 190 mg/dL — AB (ref <130)
TRIGLYCERIDES: 238 mg/dL — AB (ref <150)

## 2018-01-16 LAB — MAGNESIUM: MAGNESIUM: 1.8 mg/dL (ref 1.5–2.5)

## 2018-01-16 LAB — HEMOGLOBIN A1C
HEMOGLOBIN A1C: 7.6 %{Hb} — AB (ref <5.7)
Mean Plasma Glucose: 171 (calc)
eAG (mmol/L): 9.5 (calc)

## 2018-01-16 LAB — TSH: TSH: 2.66 m[IU]/L (ref 0.40–4.50)

## 2018-01-21 ENCOUNTER — Other Ambulatory Visit: Payer: Self-pay

## 2018-01-21 ENCOUNTER — Encounter (HOSPITAL_COMMUNITY): Payer: Self-pay | Admitting: Emergency Medicine

## 2018-01-21 ENCOUNTER — Ambulatory Visit (HOSPITAL_COMMUNITY)
Admission: EM | Admit: 2018-01-21 | Discharge: 2018-01-21 | Disposition: A | Discharge status: Home / Self Care | Payer: 59 | Attending: Family Medicine | Admitting: Family Medicine

## 2018-01-21 DIAGNOSIS — T675XXA Heat exhaustion, unspecified, initial encounter: Secondary | ICD-10-CM | POA: Diagnosis not present

## 2018-01-21 DIAGNOSIS — R110 Nausea: Secondary | ICD-10-CM

## 2018-01-21 NOTE — ED Triage Notes (Signed)
The patient presented to the [facility] with a complaint of heat exposure. The patient stated that he was outside working in the yard earlier today and became nauseous. The patient stated that he feels better now and was able to keep down fluids.

## 2018-01-23 ENCOUNTER — Other Ambulatory Visit: Payer: Self-pay | Admitting: Internal Medicine

## 2018-01-23 MED FILL — OMEPRAZOLE 20 MG CAP: 20 | 90 days supply | Qty: 90 | Fill #0

## 2018-01-23 MED FILL — SYNJARDY XR 25-1000 MG TB24: 25-1000 | 30 days supply | Qty: 30 | Fill #1

## 2018-01-30 NOTE — ED Provider Notes (Signed)
[place]   [id number] 01/21/18 Arrival Time: [place]:  1. Heat exhaustion, initial encounter    Symptoms have pretty much resolved. He is feeling back to normal. Will do his best to ensure adequate fluid intake in order to avoid dehydration. Will proceed to the Emergency Department for evaluation if unable to tolerate PO fluids regularly.  Reviewed expectations re: course of current medical issues. Questions answered. Outlined signs and symptoms indicating need for more acute intervention. Patient verbalized understanding. After Visit Summary given.   SUBJECTIVE: History from: patient.  BRAIN HONEYCUTT is a 62 y.o. male who presents with complaint of heat related symptoms. Working in yard today and reports getting overheated; stopped sweating. Went inside. Nausea without emesis. Able to drink fluids and symptoms have resolved over the past couple of hours. No HA or visual changes. Normal ambulation. No CP/SOB. No recent illnesses or new medication.   Past Surgical History:  Procedure Laterality Date  . LASER ABLATION     veins  . orthopedic surgeries     knee,foot    ROS: As per HPI.  OBJECTIVE:  Vitals:   01/21/18 1926  BP: 135/71  Pulse: 85  Resp: 18  Temp: 99.4 F (37.4 C)  TempSrc: Oral  SpO2: 100%    General appearance: alert; no distress Oropharynx: moist Lungs: clear to auscultation bilaterally Heart: regular rate and rhythm Abdomen: soft Extremities: no edema; symmetrical with no gross deformities Skin: warm and dry Neurologic: normal gait Psychological: alert and cooperative; normal mood and affect   Allergies  Allergen Reactions  . Caduet [Amlodipine-Atorvastatin]   . Statins     BLURRED VISION                                               Past Medical History:  Diagnosis Date  . Cancer ([place])   . Diabetes mellitus without complication ([place])   . GERD (gastroesophageal reflux disease)   . Hyperlipidemia   .  Hypertension   . OSA on CPAP 08/03/2013  . Unspecified vitamin D deficiency    Social History   Socioeconomic History  . Marital status: Married    Spouse name: Not on file  . Number of children: Not on file  . Years of education: Not on file  . Highest education level: Not on file  Occupational History  . Not on file  Social Needs  . Financial resource strain: Not on file  . Food insecurity:    Worry: Not on file    Inability: Not on file  . Transportation needs:    Medical: Not on file    Non-medical: Not on file  Tobacco Use  . Smoking status: Former Smoker    Last attempt to quit: 06/24/1993    Years since quitting: 24.6  . Smokeless tobacco: Never Used  Substance and Sexual Activity  . Alcohol use: No  . Drug use: No  . Sexual activity: Not on file  Lifestyle  . Physical activity:    Days per week: Not on file    Minutes per session: Not on file  . Stress: Not on file  Relationships  . Social connections:    Talks on phone: Not on file    Gets together: Not on file    Attends religious service: Not on file    Active member of  club or organization: Not on file    Attends meetings of clubs or organizations: Not on file    Relationship status: Not on file  . Intimate partner violence:    Fear of current or ex partner: Not on file    Emotionally abused: Not on file    Physically abused: Not on file    Forced sexual activity: Not on file  Other Topics Concern  . Not on file  Social History Narrative  . Not on file   Family History  Problem Relation Age of Onset  . Cancer Mother        melanoma with mets   . Heart disease Father   . Hyperlipidemia Father   . Diabetes Sister   . Hypertension Brother      Vanessa Kick, MD 01/30/18 1122

## 2018-02-03 MED FILL — CITALOPRAM HBR 40 MG TABLET: 40 | 90 days supply | Qty: 90 | Fill #1

## 2018-02-24 ENCOUNTER — Other Ambulatory Visit: Payer: Self-pay | Admitting: Internal Medicine

## 2018-02-24 MED FILL — SYNJARDY XR 25-1000 MG TB24: 25-1000 | 30 days supply | Qty: 30 | Fill #2

## 2018-02-24 MED FILL — GABAPENTIN 300 MG CAPSULE: 300 | 30 days supply | Qty: 90 | Fill #0

## 2018-03-24 MED FILL — EZETIMIBE 10 MG TABLET: 10 | 30 days supply | Qty: 30 | Fill #5

## 2018-03-24 MED FILL — SYNJARDY XR 25-1000 MG TB24: 25-1000 | 30 days supply | Qty: 30 | Fill #3

## 2018-03-27 ENCOUNTER — Encounter: Payer: Self-pay | Admitting: Physician Assistant

## 2018-04-11 ENCOUNTER — Other Ambulatory Visit: Payer: Self-pay | Admitting: Physician Assistant

## 2018-04-11 MED FILL — LOSARTAN POTASSIUM 50 MG TA: 50 | 30 days supply | Qty: 30 | Fill #0

## 2018-04-17 ENCOUNTER — Ambulatory Visit: Payer: 59 | Admitting: Physician Assistant

## 2018-04-17 ENCOUNTER — Other Ambulatory Visit: Payer: Self-pay

## 2018-04-17 ENCOUNTER — Encounter: Payer: Self-pay | Admitting: Physician Assistant

## 2018-04-17 VITALS — BP 124/86 | HR 71 | Temp 97.8°F | Resp 16 | Ht 72.0 in | Wt 241.6 lb

## 2018-04-17 DIAGNOSIS — N181 Chronic kidney disease, stage 1: Secondary | ICD-10-CM

## 2018-04-17 DIAGNOSIS — E1142 Type 2 diabetes mellitus with diabetic polyneuropathy: Secondary | ICD-10-CM

## 2018-04-17 DIAGNOSIS — I10 Essential (primary) hypertension: Secondary | ICD-10-CM | POA: Diagnosis not present

## 2018-04-17 DIAGNOSIS — F411 Generalized anxiety disorder: Secondary | ICD-10-CM

## 2018-04-17 DIAGNOSIS — Z130 Encounter for screening for diseases of the blood and blood-forming organs and certain disorders involving the immune mechanism: Secondary | ICD-10-CM

## 2018-04-17 DIAGNOSIS — E669 Obesity, unspecified: Secondary | ICD-10-CM

## 2018-04-17 DIAGNOSIS — Z79899 Other long term (current) drug therapy: Secondary | ICD-10-CM

## 2018-04-17 DIAGNOSIS — Z8659 Personal history of other mental and behavioral disorders: Secondary | ICD-10-CM

## 2018-04-17 DIAGNOSIS — E559 Vitamin D deficiency, unspecified: Secondary | ICD-10-CM

## 2018-04-17 DIAGNOSIS — Z0000 Encounter for general adult medical examination without abnormal findings: Secondary | ICD-10-CM

## 2018-04-17 DIAGNOSIS — Z136 Encounter for screening for cardiovascular disorders: Secondary | ICD-10-CM

## 2018-04-17 DIAGNOSIS — E785 Hyperlipidemia, unspecified: Secondary | ICD-10-CM | POA: Diagnosis not present

## 2018-04-17 DIAGNOSIS — Z1211 Encounter for screening for malignant neoplasm of colon: Secondary | ICD-10-CM

## 2018-04-17 DIAGNOSIS — E1122 Type 2 diabetes mellitus with diabetic chronic kidney disease: Secondary | ICD-10-CM | POA: Diagnosis not present

## 2018-04-17 DIAGNOSIS — G2581 Restless legs syndrome: Secondary | ICD-10-CM

## 2018-04-17 MED ORDER — BLOOD GLUCOSE METER KIT: PACK | 0 refills | Status: AC

## 2018-04-17 MED ORDER — ROSUVASTATIN CALCIUM 5 MG PO TABS: 5.0000 mg | ORAL_TABLET | Freq: Every day | ORAL | 1 refills | Status: DC

## 2018-04-17 MED FILL — ROSUVASTATIN CALCIUM 5 MG T: 5 | 90 days supply | Qty: 90 | Fill #0

## 2018-04-17 MED FILL — FREESTYLE LITE METER: 30 days supply | Qty: 1 | Fill #0

## 2018-04-17 MED FILL — FREESTYLE LANCETS: 90 days supply | Qty: 100 | Fill #0

## 2018-04-17 MED FILL — FREESTYLE LITE TEST STRIP: 90 days supply | Qty: 100 | Fill #0

## 2018-04-17 NOTE — Progress Notes (Signed)
Complete Physical  Assessment and Plan: Diabetes mellitus without complication Discussed general issues about diabetes pathophysiology and management., Educational material distributed., Suggested low cholesterol diet., Encouraged aerobic exercise., Discussed foot care., Reminded to get yearly retinal exam. - switched from invokana to jardiance - Hemoglobin A1c - Insulin, fasting  Hyperlipidemia -continue medications, check lipids, decrease fatty foods, increase activity. Declines statin due to intolerance, will start on zetia  - Lipid panel   Essential hypertension - continue medications, DASH diet, exercise and monitor at home. Call if greater than 130/80.  - CBC with Differential/Platelet - BASIC METABOLIC PANEL WITH GFR - Hepatic function panel - TSH - Urinalysis, Routine w reflex microscopic (not at [facility]) - Microalbumin / creatinine urine ratio  Morbid Obesity Obesity with co morbidities- long discussion about weight loss, diet, and exercise - last sleep study negative  Gastroesophageal reflux disease, esophagitis presence not specified Continue PPI/H2 blocker, diet discussed  Vitamin D deficiency - Vit D  25 hydroxy (rtn osteoporosis monitoring)  History of SCC (squamous cell carcinoma) of skin Follow up DERM  Varicose veins Varicose veins- weight loss discussed, continue compression stockings and elevation\  Encounter for general adult medical examination with abnormal findings  Medication management - Magnesium  Anemia, unspecified anemia type - Vitamin B12  Diabetes with peripheral neuropathy Decrease sugars, monitor feet.   Insomnia Try the gabapentin, ? Want to try seroquel. Wife states has PTSD/trashing in dreams, tried melatonin, minipress, and requip, mirapex, klonopin.  Discussed med's effects and SE's. Screening labs and tests as requested with regular follow-up as recommended. Over 40 minutes of exam, counseling, chart review and critical  decision making was performed  HPI Patient presents for a complete physical.  62 y.o. WM.  His blood pressure has been controlled at home, monitoring at home, today their BP is BP: 124/86 He does not workout, but stays active,working for [employer], works 3 days a week, lots of walking, retired Psychologist, occupational. He denies chest pain, shortness of breath, dizziness.  He is on celexa for anxiety, only on 1/2 a pill.  He is on cholesterol medication, he is on zetia, and denies myalgias. His cholesterol is not at goal. The cholesterol last visit was:   Lab Results  Component Value Date   CHOL 234 (H) 01/15/2018   HDL 44 01/15/2018   LDLCALC 150 (H) 01/15/2018   TRIG 238 (H) 01/15/2018   CHOLHDL 5.3 (H) 01/15/2018   He has been working on diet and exercise for diabetes with CKD, he is on synjardy XR 25/1000 once, he is on bASA, he is on ACE/ARB and denies paresthesia of the feet, polydipsia, polyuria and visual disturbances. Last A1C in the office was:  Lab Results  Component Value Date   HGBA1C 7.6 (H) 01/15/2018   Lab Results  Component Value Date   GFRNONAA 82 01/15/2018   Patient is on Vitamin D supplement, not on vitamin D.   Lab Results  Component Value Date   VD25OH 31 03/26/2017     Last PSA was: Lab Results  Component Value Date   PSA 0.2 03/27/2016   BMI is Body mass index is 32.77 kg/m., he is working on diet and exercise. Last sleep study negative, not on CPAP. Wife states has PTSD/trashing in dreams, tried melatonin, minipress, and requip, mirapex, klonopin. He is not on gabapentin.  Wt Readings from Last 3 Encounters:  04/17/18 241 lb 9.6 oz (109.6 kg)  01/15/18 242 lb (109.8 kg)  10/15/17 236 lb 12.8 oz (107.4  kg)    Current Medications:  Current Outpatient Medications on File Prior to Visit  Medication Sig Dispense Refill  . aspirin 81 MG tablet Take 81 mg by mouth daily.    . citalopram (CELEXA) 40 MG tablet TAKE 1 TABLET (40 MG TOTAL) BY MOUTH DAILY. 90 tablet  1  . gabapentin (NEURONTIN) 300 MG capsule TAKE 1 TO 3 CAPSULES BY MOUTH AT NIGHT FOR RESTLESS LEG 90 capsule 0  . losartan (COZAAR) 50 MG tablet TAKE 1 TABLET (50 MG TOTAL) BY MOUTH DAILY. 30 tablet 11  . Magnesium 100 MG CAPS Take by mouth.    . Multiple Vitamins-Minerals (ZINC PO) Take by mouth.    Marland Kitchen omeprazole (PRILOSEC) 20 MG capsule TAKE 1 CAPSULE BY MOUTH ONCE DAILY 90 capsule 1  . SYNJARDY XR 25-1000 MG TB24 TAKE 1 TABLET BY MOUTH ONCE DAILY 30 tablet 3  . TURMERIC PO Take by mouth.    . ezetimibe (ZETIA) 10 MG tablet Take 1 tablet (10 mg total) by mouth daily. 30 tablet 11   No current facility-administered medications on file prior to visit.    Health Maintenance:  Immunization History  Administered Date(s) Administered  . Influenza-Unspecified 05/07/2013  . Pneumococcal Polysaccharide-23 06/24/2013  . Td 07/09/2006   Tetanus: 2016 at work Pneumovax: 2014 Prevnar 13: Flu vaccine: at work Zostavax: DEXA: Colonoscopy: Dr. Benson Norway 2008  EGD: Sleep study 2017 Eye Exam: [facility] 05/2017 Dentist:  Patient Care Team: Unk Pinto, MD as PCP - General (Internal Medicine) Carol Ada, MD as Consulting Physician (Gastroenterology)  Dr. Johnsie Cancel 2007 normal stress test Dr. Kellie Simmering Dr. Benson Norway Dr. Lorin Mercy  Dr. Kristian Covey  Medical History:  Past Medical History:  Diagnosis Date  . Cancer ([place])   . Diabetes mellitus without complication ([place])   . GERD (gastroesophageal reflux disease)   . Hyperlipidemia   . Hypertension   . OSA on CPAP 08/03/2013  . Unspecified vitamin D deficiency    Allergies Allergies  Allergen Reactions  . Caduet [Amlodipine-Atorvastatin]   . Statins     BLURRED VISION    SURGICAL HISTORY He  has a past surgical history that includes orthopedic surgeries and Laser ablation. FAMILY HISTORY His family history includes Cancer in his mother; Diabetes in his sister; Heart disease in his father; Hyperlipidemia in his father; Hypertension in his  brother. SOCIAL HISTORY He  reports that he quit smoking about 24 years ago. He has never used smokeless tobacco. He reports that he does not drink alcohol or use drugs.  Review of Systems:  Review of Systems  Constitutional: Negative.   HENT: Negative for congestion, ear discharge, ear pain, hearing loss, nosebleeds, sore throat and tinnitus.   Eyes: Negative.   Respiratory: Negative.  Negative for cough, shortness of breath and stridor.   Cardiovascular: Positive for leg swelling. Negative for chest pain, palpitations, orthopnea, claudication and PND.  Gastrointestinal: Negative.   Genitourinary: Negative.  Negative for frequency and urgency.  Musculoskeletal: Positive for back pain and joint pain. Negative for falls, myalgias and neck pain.  Skin: Negative.   Neurological: Negative.  Negative for dizziness and headaches.  Psychiatric/Behavioral: Negative for depression, hallucinations, memory loss, substance abuse and suicidal ideas. The patient has insomnia. The patient is not nervous/anxious.     Physical Exam: Estimated body mass index is 32.77 kg/m as calculated from the following:   Height as of this encounter: 6' (1.829 m).   Weight as of this encounter: 241 lb 9.6 oz (109.6 kg). BP 124/86  Pulse 71   Temp 97.8 F (36.6 C)   Resp 16   Ht 6' (1.829 m)   Wt 241 lb 9.6 oz (109.6 kg)   SpO2 98%   BMI 32.77 kg/m  General Appearance: Well nourished, in no apparent distress.  Eyes: PERRLA, EOMs, conjunctiva no swelling or erythema, normal fundi and vessels.  Sinuses: No Frontal/maxillary tenderness  ENT/Mouth: Ext aud canals clear, normal light reflex with TMs without erythema, bulging. Good dentition. No erythema, swelling, or exudate on post pharynx. Tonsils not swollen or erythematous. Hearing normal.  Neck: Supple, thyroid normal. No bruits  Respiratory: Respiratory effort normal, BS equal bilaterally without rales, rhonchi, wheezing or stridor.  Cardio: RRR without  murmurs, rubs or gallops. Brisk peripheral pulses without edema.  Chest: symmetric, with normal excursions and percussion.  Abdomen: Soft, nontender, no guarding, rebound, hernias, masses, or organomegaly.  Lymphatics: Non tender without lymphadenopathy.  Genitourinary: defer Musculoskeletal: Full ROM all peripheral extremities,5/5 strength, and normal gait.Bilateral varicose veins.   Skin: Warm, dry without rashes, lesions, ecchymosis. Neuro: Cranial nerves intact, reflexes equal bilaterally. Normal muscle tone, no cerebellar symptoms. Sensation decreased bilateral feet to ankle. Psych: Awake and oriented X 3, normal affect, Insight and Judgment appropriate.   EKG: WNL no changes. AORTA SCAN: defer  Vicie Mutters 11:13 AM [facility]

## 2018-04-17 NOTE — Patient Instructions (Addendum)
Gbapentin take 1-2 at night 1-3 hours before bed  You can take tylenol (500mg ) or tylenol arthritis (650mg ) with the meloxicam/antiinflammatories. The max you can take of tylenol a day is 3000mg  daily, this is a max of 6 pills a day of the regular tyelnol (500mg ) or a max of 4 a day of the tylenol arthritis (650mg ) as long as no other medications you are taking contain tylenol.    Varicose Veins Varicose veins are veins that have become enlarged and twisted. CAUSES This condition is the result of valves in the veins not working properly. Valves in the veins help return blood from the leg to the heart. When your calf muscles squeeze, the blood moves up your leg then the valves close and this continues until the blood gets back to your heart.  If these valves are damaged, blood flows backwards and backs up into the veins in the leg near the skin OR if your are sitting/standing for a long time without using your calf muscles the blood will back up into the veins in your legs. This causes the veins to become larger. People who are on their feet a lot, sit a lot without walking (like on a plane, at a desk, or in a car), who are pregnant, or who are overweight are more likely to develop varicose veins. SYMPTOMS   Bulging, twisted-appearing, bluish veins, most commonly found on the legs.  Leg pain or a feeling of heaviness. These symptoms may be worse at the end of the day.  Leg swelling.  Skin color changes. DIAGNOSIS  Varicose veins can usually be diagnosed with an exam of your legs by your caregiver. He or she may recommend an ultrasound of your leg veins. TREATMENT  Most varicose veins can be treated at home.However, other treatments are available for people who have persistent symptoms or who want to treat the cosmetic appearance of the varicose veins. But this is only cosmetic and they will return if not properly treated. These include:  Laser treatment of very small varicose  veins.  Medicine that is shot (injected) into the vein. This medicine hardens the walls of the vein and closes off the vein. This treatment is called sclerotherapy. Afterwards, you may need to wear clothing or bandages that apply pressure.  Surgery. HOME CARE INSTRUCTIONS   Do not stand or sit in one position for long periods of time. Do not sit with your legs crossed. Rest with your legs raised during the day.  Your legs have to be higher than your heart so that gravity will force the valves to open, so please really elevate your legs.   Wear elastic stockings or support hose. Do not wear other tight, encircling garments around the legs, pelvis, or waist.  [employer]  has a wide variety of well priced compression stockings. [place], [place] [id number] [phone]  OR THERE ARE COPPER INFUSED COMPRESSION SOCKS AT [facility] OR [facility]  [employer] also has great cheap/afforable stockings or socks- the socks are easier to get on your feet  - can also get a jacob's donner that helps you put on the sock  Walk as much as possible to increase blood flow.  Raise the foot of your bed at night with 2-inch blocks.  If you get a cut in the skin over the vein and the vein bleeds, lie down with your leg raised and press on it with a clean cloth until the bleeding stops. Then place a  bandage (dressing) on the cut. See your caregiver if it continues to bleed or needs stitches. SEEK MEDICAL CARE IF:   The skin around your ankle starts to break down.  You have pain, redness, tenderness, or hard swelling developing in your leg over a vein.  You are uncomfortable due to leg pain. Document Released: 04/04/2005 Document Revised: 09/17/2011 Document Reviewed: 08/21/2010 [facility] Patient Information 2014 [employer].    Diabetes or even increased sugars put you at 300% increased risk of heart attack and stroke.  ALSO BEING DIABETIC YOU MAY NOT HAVE ANY PAIN WITH A HEART ATTACK.  Even  worse of a chance of no pain if you are a woman.  It is very unlikely that you will have any pain with a heart attack. Likely your symptoms will be very subtle, even for very severe disease.  Your symptoms for a heart attack will likely occur when you exert your self or exercise and include: Shortness of breath Sweating Nausea Dizziness Fast or irregular heart beats Fatigue   It makes me feel better if my diabetics get their heart rate up with exercise once or twice a week and pay close attention to your body. If there is ANY change in your exercise capacity or if you have symptoms above, please STOP and call 911 or call to come to the office.   PLEASE REMEMBER:  Diabetes is preventable! Up to 23 percent of complications and morbidities among individuals with type 2 diabetes can be prevented, delayed, or effectively treated and minimized with regular visits to a health professional, appropriate monitoring and medication, and a healthy diet and lifestyle.   Here is some information to help you keep your heart healthy: Move it! - Aim for 30 mins of activity every day. Take it slowly at first. Talk to [place] before starting any new exercise program.   Lose it.  -Body Mass Index (BMI) can indicate if you need to lose weight. A healthy range is 18.5-24.9. For a BMI calculator, go to [employer]  Waist Management -Excess abdominal fat is a risk factor for heart disease, diabetes, asthma, stroke and more. Ideal waist circumference is less than 35" for women and less than 40" for men.   Eat Right -focus on fruits, vegetables, whole grains, and meals you make yourself. Avoid foods with trans fat and high sugar/sodium content.   Snooze or Snore? - Loud snoring can be a sign of sleep apnea, a significant risk factor for high blood pressure, heart attach, stroke, and heart arrhythmias.  Kick the habit -Quit Smoking! Avoid second hand smoke. A single cigarette raises your blood pressure for 20  mins and increases the risk of heart attack and stroke for the next 24 hours.   Are Aspirin and Supplements right for you? -Add ENTERIC COATED low dose 81 mg Aspirin daily OR can do every other day if you have easy bruising to protect your heart and head. As well as to reduce risk of Colon Cancer by 20 %, Skin Cancer by 26 % , Melanoma by 46% and Pancreatic cancer by 60%  Say "No to Stress -There may be little you can do about problems that cause stress. However, techniques such as long walks, meditation, and exercise can help you manage it.   Start Now! - Make changes one at a time and set reasonable goals to increase your likelihood of success.

## 2018-04-18 ENCOUNTER — Other Ambulatory Visit: Payer: Self-pay | Admitting: Physician Assistant

## 2018-04-18 DIAGNOSIS — E785 Hyperlipidemia, unspecified: Secondary | ICD-10-CM

## 2018-04-18 LAB — CBC WITH DIFFERENTIAL/PLATELET
BASOS ABS: 28 {cells}/uL (ref 0–200)
Basophils Relative: 0.4 %
Eosinophils Absolute: 228 cells/uL (ref 15–500)
Eosinophils Relative: 3.3 %
HEMATOCRIT: 43.4 % (ref 38.5–50.0)
Hemoglobin: 15 g/dL (ref 13.2–17.1)
LYMPHS ABS: 2615 {cells}/uL (ref 850–3900)
MCH: 29.1 pg (ref 27.0–33.0)
MCHC: 34.6 g/dL (ref 32.0–36.0)
MCV: 84.3 fL (ref 80.0–100.0)
MPV: 9.9 fL (ref 7.5–12.5)
Monocytes Relative: 6.1 %
NEUTROS PCT: 52.3 %
Neutro Abs: 3609 cells/uL (ref 1500–7800)
PLATELETS: 209 10*3/uL (ref 140–400)
RBC: 5.15 10*6/uL (ref 4.20–5.80)
RDW: 12.4 % (ref 11.0–15.0)
TOTAL LYMPHOCYTE: 37.9 %
WBC: 6.9 10*3/uL (ref 3.8–10.8)
WBCMIX: 421 {cells}/uL (ref 200–950)

## 2018-04-18 LAB — COMPLETE METABOLIC PANEL WITHOUT GFR
AG Ratio: 1.5 (calc) (ref 1.0–2.5)
ALT: 36 U/L (ref 9–46)
AST: 21 U/L (ref 10–35)
Albumin: 4.3 g/dL (ref 3.6–5.1)
Alkaline phosphatase (APISO): 60 U/L (ref 40–115)
BUN: 15 mg/dL (ref 7–25)
CO2: 28 mmol/L (ref 20–32)
Calcium: 9.7 mg/dL (ref 8.6–10.3)
Chloride: 102 mmol/L (ref 98–110)
Creat: 0.95 mg/dL (ref 0.70–1.25)
GFR, Est African American: 98 mL/min/{1.73_m2}
GFR, Est Non African American: 85 mL/min/{1.73_m2}
Globulin: 2.8 g/dL (ref 1.9–3.7)
Glucose, Bld: 154 mg/dL — ABNORMAL HIGH (ref 65–99)
Potassium: 4.2 mmol/L (ref 3.5–5.3)
Sodium: 139 mmol/L (ref 135–146)
Total Bilirubin: 0.8 mg/dL (ref 0.2–1.2)
Total Protein: 7.1 g/dL (ref 6.1–8.1)

## 2018-04-18 LAB — URINALYSIS, ROUTINE W REFLEX MICROSCOPIC
Bilirubin Urine: NEGATIVE
Hgb urine dipstick: NEGATIVE
Ketones, ur: NEGATIVE
Leukocytes, UA: NEGATIVE
Nitrite: NEGATIVE
Protein, ur: NEGATIVE
Specific Gravity, Urine: 1.045 — ABNORMAL HIGH (ref 1.001–1.03)
pH: 5.5 (ref 5.0–8.0)

## 2018-04-18 LAB — IRON, TOTAL/TOTAL IRON BINDING CAP
%SAT: 27 % (ref 20–48)
Iron: 96 ug/dL (ref 50–180)
TIBC: 353 mcg/dL (calc) (ref 250–425)

## 2018-04-18 LAB — LIPID PANEL
Cholesterol: 234 mg/dL — ABNORMAL HIGH (ref <200)
HDL: 46 mg/dL (ref >40)
LDL CHOLESTEROL (CALC): 146 mg/dL — AB
Non-HDL Cholesterol (Calc): 188 mg/dL (calc) — ABNORMAL HIGH (ref <130)
TRIGLYCERIDES: 275 mg/dL — AB (ref <150)
Total CHOL/HDL Ratio: 5.1 (calc) — ABNORMAL HIGH (ref <5.0)

## 2018-04-18 LAB — HEMOGLOBIN A1C
HEMOGLOBIN A1C: 8.4 %{Hb} — AB (ref <5.7)
MEAN PLASMA GLUCOSE: 194 (calc)
eAG (mmol/L): 10.8 (calc)

## 2018-04-18 LAB — MAGNESIUM: Magnesium: 1.9 mg/dL (ref 1.5–2.5)

## 2018-04-18 LAB — VITAMIN D 25 HYDROXY (VIT D DEFICIENCY, FRACTURES): Vit D, 25-Hydroxy: 51 ng/mL (ref 30–100)

## 2018-04-18 LAB — MICROALBUMIN / CREATININE URINE RATIO
Creatinine, Urine: 116 mg/dL (ref 20–320)
Microalb Creat Ratio: 5 mcg/mg creat (ref <30)
Microalb, Ur: 0.6 mg/dL

## 2018-04-18 LAB — TSH: TSH: 1.82 m[IU]/L (ref 0.40–4.50)

## 2018-04-18 LAB — VITAMIN B12: Vitamin B-12: 440 pg/mL (ref 200–1100)

## 2018-04-18 MED FILL — EZETIMIBE 10 MG TABLET: 10 | 30 days supply | Qty: 30 | Fill #0

## 2018-04-28 ENCOUNTER — Other Ambulatory Visit: Payer: Self-pay | Admitting: Internal Medicine

## 2018-04-28 ENCOUNTER — Other Ambulatory Visit: Payer: Self-pay | Admitting: Physician Assistant

## 2018-04-28 MED FILL — OMEPRAZOLE 20 MG CPDR: 20 | 90 days supply | Qty: 90 | Fill #1

## 2018-04-28 MED FILL — SYNJARDY XR 25-1000 MG TB24: 25-1000 | 30 days supply | Qty: 30 | Fill #0

## 2018-04-28 MED FILL — CITALOPRAM HBR 40 MG TABLET: 40 | 90 days supply | Qty: 90 | Fill #0

## 2018-04-29 ENCOUNTER — Encounter: Payer: Self-pay | Admitting: Gastroenterology

## 2018-05-06 ENCOUNTER — Encounter: Payer: Self-pay | Admitting: Gastroenterology

## 2018-05-06 ENCOUNTER — Ambulatory Visit (AMBULATORY_SURGERY_CENTER): Payer: Self-pay | Admitting: *Deleted

## 2018-05-06 VITALS — Ht 72.0 in | Wt 244.0 lb

## 2018-05-06 DIAGNOSIS — Z1211 Encounter for screening for malignant neoplasm of colon: Secondary | ICD-10-CM

## 2018-05-06 MED ORDER — NA SULFATE-K SULFATE-MG SULF 17.5-3.13-1.6 GM/177ML PO SOLN: ORAL | 0 refills | Status: DC

## 2018-05-06 NOTE — Progress Notes (Signed)
Wife with pt during PV. Patient denies any allergies to eggs or soy. Patient denies any problems with anesthesia/sedation. Patient denies any oxygen use at home. Patient denies taking any diet/weight loss medications or blood thinners.

## 2018-05-15 MED FILL — LOSARTAN POTASSIUM 50 MG TA: 50 | 30 days supply | Qty: 30 | Fill #1

## 2018-05-20 ENCOUNTER — Ambulatory Visit (AMBULATORY_SURGERY_CENTER): Payer: 59 | Admitting: Gastroenterology

## 2018-05-20 ENCOUNTER — Telehealth: Payer: Self-pay | Admitting: Gastroenterology

## 2018-05-20 ENCOUNTER — Encounter: Payer: Self-pay | Admitting: Gastroenterology

## 2018-05-20 VITALS — BP 146/74 | HR 58 | Temp 97.3°F | Resp 10 | Ht 72.0 in | Wt 241.0 lb

## 2018-05-20 DIAGNOSIS — E119 Type 2 diabetes mellitus without complications: Secondary | ICD-10-CM | POA: Diagnosis not present

## 2018-05-20 DIAGNOSIS — D123 Benign neoplasm of transverse colon: Secondary | ICD-10-CM

## 2018-05-20 DIAGNOSIS — G4733 Obstructive sleep apnea (adult) (pediatric): Secondary | ICD-10-CM | POA: Diagnosis not present

## 2018-05-20 DIAGNOSIS — Z1211 Encounter for screening for malignant neoplasm of colon: Secondary | ICD-10-CM

## 2018-05-20 DIAGNOSIS — I10 Essential (primary) hypertension: Secondary | ICD-10-CM | POA: Diagnosis not present

## 2018-05-20 DIAGNOSIS — I2510 Atherosclerotic heart disease of native coronary artery without angina pectoris: Secondary | ICD-10-CM | POA: Diagnosis not present

## 2018-05-20 DIAGNOSIS — K219 Gastro-esophageal reflux disease without esophagitis: Secondary | ICD-10-CM | POA: Diagnosis not present

## 2018-05-20 MED ORDER — SODIUM CHLORIDE 0.9 % IV SOLN: 500.0000 mL | Freq: Once | INTRAVENOUS | Status: DC

## 2018-05-20 NOTE — Telephone Encounter (Signed)
Patient's wife called about 8:30 pm stating husband developed nausea and vomiting this evening.  Felt well after colonoscopy ealier in day, able to eat and was active all day.  She had zofran at home, which I advised her to give him.  Please have nursing call him in AM.

## 2018-05-20 NOTE — Progress Notes (Signed)
Pt's states no medical or surgical changes since previsit or office visit. 

## 2018-05-20 NOTE — Progress Notes (Signed)
Called to room to assist during endoscopic procedure.  Patient ID and intended procedure confirmed with present staff. Received instructions for my participation in the procedure from the performing physician.  

## 2018-05-20 NOTE — Progress Notes (Signed)
Report to RN, VSS, adequate respirations noted, no c/o pain or discomfort 

## 2018-05-20 NOTE — Op Note (Signed)
[facility] Patient Name: Glenn Stephens Procedure Date: 05/20/2018 8:43 AM MRN: [id number] Endoscopist: Justice Britain , MD Age: 62 Referring MD:  Date of Birth: [birth year] Gender: Male Account #: [id number] Procedure:                Colonoscopy Indications:              Screening for colorectal malignant neoplasm Medicines:                Monitored Anesthesia Care Procedure:                Pre-Anesthesia Assessment:                           - Prior to the procedure, a History and Physical                            was performed, and patient medications and                            allergies were reviewed. The patient's tolerance of                            previous anesthesia was also reviewed. The risks                            and benefits of the procedure and the sedation                            options and risks were discussed with the patient.                            All questions were answered, and informed consent                            was obtained. Prior Anticoagulants: The patient has                            taken no previous anticoagulant or antiplatelet                            agents. ASA Grade Assessment: II - A patient with                            mild systemic disease. After reviewing the risks                            and benefits, the patient was deemed in                            satisfactory condition to undergo the procedure.                           After obtaining informed consent, the colonoscope  was passed under direct vision. Throughout the                            procedure, the patient's blood pressure, pulse, and                            oxygen saturations were monitored continuously. The                            Colonoscope was introduced through the anus and                            advanced to the 3 cm into the ileum. The                            colonoscopy was  performed without difficulty. The                            patient tolerated the procedure. The quality of the                            bowel preparation was evaluated using the BBPS                            [facility] Bowel Preparation Scale) with scores of:                            Right Colon = 2 (minor amount of residual staining,                            small fragments of stool and/or opaque liquid, but                            mucosa seen well), Transverse Colon = 3 (entire                            mucosa seen well with no residual staining, small                            fragments of stool or opaque liquid) and Left Colon                            = 2 (minor amount of residual staining, small                            fragments of stool and/or opaque liquid, but mucosa                            seen well). The total BBPS score equals 7. The                            quality of the bowel preparation was fair. Scope In:  8:51:53 AM Scope Out: 9:09:31 AM Scope Withdrawal Time: 0 hours 13 minutes 35 seconds  Total Procedure Duration: 0 hours 17 minutes 38 seconds  Findings:                 The digital rectal exam findings include                            non-thrombosed internal hemorrhoids. Pertinent                            negatives include no palpable rectal lesions.                           The ileocecal valve appeared normal.                           A 4 mm polyp was found in the transverse colon. The                            polyp was sessile. The polyp was removed with a                            cold snare. Resection and retrieval were complete.                           A few small-mouthed diverticula were found in the                            sigmoid colon.                           Normal mucosa was found in the entire colon                            otherwise.                           Non-bleeding non-thrombosed internal hemorrhoids                             were found during retroflexion, during perianal                            exam and during digital exam. The hemorrhoids were                            medium-sized and Grade II (internal hemorrhoids                            that prolapse but reduce spontaneously). Complications:            No immediate complications. Estimated Blood Loss:     Estimated blood loss was minimal. Impression:               - Preparation of the colon was fair.                           -  Non-thrombosed internal hemorrhoids found on                            digital rectal exam.                           - The examined portion of the ileum was normal.                           - One 4 mm polyp in the transverse colon, removed                            with a cold snare. Resected and retrieved.                           - Diverticulosis in the sigmoid colon.                           - Normal mucosa in the entire examined colon                            otherwise.                           - Non-bleeding non-thrombosed internal hemorrhoids. Recommendation:           - The patient will be observed post-procedure,                            until all discharge criteria are met.                           - Discharge patient to home.                           - Patient has a contact number available for                            emergencies. The signs and symptoms of potential                            delayed complications were discussed with the                            patient. Return to normal activities tomorrow.                            Written discharge instructions were provided to the                            patient.                           - High fiber diet.                           -  Continue present medications.                           - Consider initiation of fiber supplementation 1-2                            times daily.                           - Await pathology  results.                           - Repeat colonoscopy in 5-10 years for surveillance                            based on pathology results and findings of                            adenomatous tissue.                           - The findings and recommendations were discussed                            with the patient.                           - The findings and recommendations were discussed                            with the patient's family. Justice Britain, MD 05/20/2018 9:16:16 AM

## 2018-05-20 NOTE — Patient Instructions (Signed)
*   handout on polyps, diverticulosis and hemorrhoids given*  YOU HAD AN ENDOSCOPIC PROCEDURE TODAY AT [facility]:   Refer to the procedure report that was given to you for any specific questions about what was found during the examination.  If the procedure report does not answer your questions, please call your gastroenterologist to clarify.  If you requested that your care partner not be given the details of your procedure findings, then the procedure report has been included in a sealed envelope for you to review at your convenience later.  YOU SHOULD EXPECT: Some feelings of bloating in the abdomen. Passage of more gas than usual.  Walking can help get rid of the air that was put into your GI tract during the procedure and reduce the bloating. If you had a lower endoscopy (such as a colonoscopy or flexible sigmoidoscopy) you may notice spotting of blood in your stool or on the toilet paper. If you underwent a bowel prep for your procedure, you may not have a normal bowel movement for a few days.  Please Note:  You might notice some irritation and congestion in your nose or some drainage.  This is from the oxygen used during your procedure.  There is no need for concern and it should clear up in a day or so.  SYMPTOMS TO REPORT IMMEDIATELY:   Following lower endoscopy (colonoscopy or flexible sigmoidoscopy):  Excessive amounts of blood in the stool  Significant tenderness or worsening of abdominal pains  Swelling of the abdomen that is new, acute  Fever of 100F or higher   For urgent or emergent issues, a gastroenterologist can be reached at any hour by calling [phone].   DIET:  We do recommend a small meal at first, but then you may proceed to your regular diet.  Drink plenty of fluids but you should avoid alcoholic beverages for 24 hours.  ACTIVITY:  You should plan to take it easy for the rest of today and you should NOT DRIVE or use heavy machinery until  tomorrow (because of the sedation medicines used during the test).    FOLLOW UP: Our staff will call the number listed on your records the next business day following your procedure to check on you and address any questions or concerns that you may have regarding the information given to you following your procedure. If we do not reach you, we will leave a message.  However, if you are feeling well and you are not experiencing any problems, there is no need to return our call.  We will assume that you have returned to your regular daily activities without incident.  If any biopsies were taken you will be contacted by phone or by letter within the next 1-3 weeks.  Please call us at [phone] if you have not heard about the biopsies in 3 weeks.    SIGNATURES/CONFIDENTIALITY: You and/or your care partner have signed paperwork which will be entered into your electronic medical record.  These signatures attest to the fact that that the information above on your After Visit Summary has been reviewed and is understood.  Full responsibility of the confidentiality of this discharge information lies with you and/or your care-partner.

## 2018-05-21 ENCOUNTER — Telehealth: Payer: Self-pay

## 2018-05-21 NOTE — Telephone Encounter (Signed)
I spoke with the pt's wife and she states the pt is feeling much better and has been working in the yard some today.  She/He will call back if needed.

## 2018-05-21 NOTE — Telephone Encounter (Signed)
  Follow up Call-  Call back number 05/20/2018  Post procedure Call Back phone  # [phone]  Permission to leave phone message Yes  Some recent data might be hidden     Patient questions:  Do you have a fever, pain , or abdominal swelling? No. Pain Score  0 *  Have you tolerated food without any problems? No.  Have you been able to return to your normal activities? No.  Do you have any questions about your discharge instructions: Diet   No. Medications  No. Follow up visit  No.  Do you have questions or concerns about your Care? Yes.    Actions: * If pain score is 4 or above: No action needed, pain <4. Pt wife verbalize pt had nausea and vomiting yesterday evening. Called MD on call and was told to give zofran. Pt took zofran yesterday and wife believes it helped pt was still sleeping this morning when I called. Advised to call back if not feeling better.

## 2018-05-21 NOTE — Telephone Encounter (Signed)
Thank you Mallie Mussel for the update. I see that the staff from [facility] called and spoke with wife who felt that patient did well with Zofran. I tried calling the patient on multiple numbers and was not able to reach him. I left a voicemail to both he and his wife. Patty, can you reach out this PM and see if you can ensure he is still doing well. Thanks. Chester Holstein

## 2018-05-22 ENCOUNTER — Ambulatory Visit (HOSPITAL_COMMUNITY): Payer: 59

## 2018-05-22 ENCOUNTER — Encounter (HOSPITAL_COMMUNITY): Payer: Self-pay

## 2018-05-22 ENCOUNTER — Telehealth: Payer: Self-pay | Admitting: Gastroenterology

## 2018-05-22 ENCOUNTER — Inpatient Hospital Stay (HOSPITAL_COMMUNITY)
Admission: EM | Admit: 2018-05-22 | Discharge: 2018-05-24 | DRG: 418 | Disposition: A | Discharge status: Home / Self Care | Payer: 59 | Attending: Oncology | Admitting: Oncology

## 2018-05-22 DIAGNOSIS — K8020 Calculus of gallbladder without cholecystitis without obstruction: Secondary | ICD-10-CM

## 2018-05-22 DIAGNOSIS — K8000 Calculus of gallbladder with acute cholecystitis without obstruction: Secondary | ICD-10-CM | POA: Diagnosis present

## 2018-05-22 DIAGNOSIS — R112 Nausea with vomiting, unspecified: Secondary | ICD-10-CM | POA: Diagnosis not present

## 2018-05-22 DIAGNOSIS — K8050 Calculus of bile duct without cholangitis or cholecystitis without obstruction: Secondary | ICD-10-CM

## 2018-05-22 DIAGNOSIS — Z85828 Personal history of other malignant neoplasm of skin: Secondary | ICD-10-CM

## 2018-05-22 DIAGNOSIS — G2581 Restless legs syndrome: Secondary | ICD-10-CM | POA: Diagnosis present

## 2018-05-22 DIAGNOSIS — K219 Gastro-esophageal reflux disease without esophagitis: Secondary | ICD-10-CM | POA: Diagnosis present

## 2018-05-22 DIAGNOSIS — K869 Disease of pancreas, unspecified: Secondary | ICD-10-CM | POA: Diagnosis present

## 2018-05-22 DIAGNOSIS — K760 Fatty (change of) liver, not elsewhere classified: Secondary | ICD-10-CM | POA: Diagnosis not present

## 2018-05-22 DIAGNOSIS — M1990 Unspecified osteoarthritis, unspecified site: Secondary | ICD-10-CM | POA: Diagnosis present

## 2018-05-22 DIAGNOSIS — I10 Essential (primary) hypertension: Secondary | ICD-10-CM | POA: Diagnosis present

## 2018-05-22 DIAGNOSIS — K8010 Calculus of gallbladder with chronic cholecystitis without obstruction: Secondary | ICD-10-CM | POA: Diagnosis not present

## 2018-05-22 DIAGNOSIS — K8080 Other cholelithiasis without obstruction: Secondary | ICD-10-CM

## 2018-05-22 DIAGNOSIS — F4310 Post-traumatic stress disorder, unspecified: Secondary | ICD-10-CM | POA: Diagnosis present

## 2018-05-22 DIAGNOSIS — R17 Unspecified jaundice: Secondary | ICD-10-CM

## 2018-05-22 DIAGNOSIS — E669 Obesity, unspecified: Secondary | ICD-10-CM | POA: Diagnosis present

## 2018-05-22 DIAGNOSIS — E559 Vitamin D deficiency, unspecified: Secondary | ICD-10-CM

## 2018-05-22 DIAGNOSIS — Z888 Allergy status to other drugs, medicaments and biological substances status: Secondary | ICD-10-CM | POA: Diagnosis not present

## 2018-05-22 DIAGNOSIS — Z7982 Long term (current) use of aspirin: Secondary | ICD-10-CM | POA: Diagnosis not present

## 2018-05-22 DIAGNOSIS — R945 Abnormal results of liver function studies: Secondary | ICD-10-CM

## 2018-05-22 DIAGNOSIS — R7989 Other specified abnormal findings of blood chemistry: Secondary | ICD-10-CM

## 2018-05-22 DIAGNOSIS — E1142 Type 2 diabetes mellitus with diabetic polyneuropathy: Secondary | ICD-10-CM | POA: Diagnosis present

## 2018-05-22 DIAGNOSIS — Z79899 Other long term (current) drug therapy: Secondary | ICD-10-CM

## 2018-05-22 DIAGNOSIS — F411 Generalized anxiety disorder: Secondary | ICD-10-CM | POA: Diagnosis present

## 2018-05-22 DIAGNOSIS — G4733 Obstructive sleep apnea (adult) (pediatric): Secondary | ICD-10-CM | POA: Diagnosis present

## 2018-05-22 DIAGNOSIS — E785 Hyperlipidemia, unspecified: Secondary | ICD-10-CM | POA: Diagnosis present

## 2018-05-22 DIAGNOSIS — K8012 Calculus of gallbladder with acute and chronic cholecystitis without obstruction: Secondary | ICD-10-CM | POA: Diagnosis present

## 2018-05-22 DIAGNOSIS — Z87891 Personal history of nicotine dependence: Secondary | ICD-10-CM

## 2018-05-22 DIAGNOSIS — R748 Abnormal levels of other serum enzymes: Secondary | ICD-10-CM | POA: Diagnosis present

## 2018-05-22 DIAGNOSIS — R935 Abnormal findings on diagnostic imaging of other abdominal regions, including retroperitoneum: Secondary | ICD-10-CM | POA: Diagnosis not present

## 2018-05-22 DIAGNOSIS — R011 Cardiac murmur, unspecified: Secondary | ICD-10-CM | POA: Diagnosis present

## 2018-05-22 DIAGNOSIS — E119 Type 2 diabetes mellitus without complications: Secondary | ICD-10-CM | POA: Diagnosis not present

## 2018-05-22 DIAGNOSIS — Z7984 Long term (current) use of oral hypoglycemic drugs: Secondary | ICD-10-CM | POA: Diagnosis not present

## 2018-05-22 DIAGNOSIS — K8689 Other specified diseases of pancreas: Secondary | ICD-10-CM

## 2018-05-22 DIAGNOSIS — Z6832 Body mass index (BMI) 32.0-32.9, adult: Secondary | ICD-10-CM | POA: Diagnosis not present

## 2018-05-22 DIAGNOSIS — Z9049 Acquired absence of other specified parts of digestive tract: Secondary | ICD-10-CM

## 2018-05-22 HISTORY — DX: Obstructive sleep apnea (adult) (pediatric): G47.33

## 2018-05-22 HISTORY — DX: Calculus of gallbladder with acute cholecystitis without obstruction: K80.00

## 2018-05-22 LAB — CBC WITH DIFFERENTIAL/PLATELET
Abs Immature Granulocytes: 0.01 10*3/uL (ref 0.00–0.07)
BASOS PCT: 0 %
Basophils Absolute: 0 10*3/uL (ref 0.0–0.1)
EOS PCT: 2 %
Eosinophils Absolute: 0.1 10*3/uL (ref 0.0–0.5)
HCT: 42.8 % (ref 39.0–52.0)
Hemoglobin: 13.8 g/dL (ref 13.0–17.0)
Immature Granulocytes: 0 %
Lymphocytes Relative: 20 %
Lymphs Abs: 1.1 10*3/uL (ref 0.7–4.0)
MCH: 28.8 pg (ref 26.0–34.0)
MCHC: 32.2 g/dL (ref 30.0–36.0)
MCV: 89.4 fL (ref 80.0–100.0)
MONO ABS: 0.4 10*3/uL (ref 0.1–1.0)
MONOS PCT: 7 %
Neutro Abs: 4 10*3/uL (ref 1.7–7.7)
Neutrophils Relative %: 71 %
PLATELETS: 139 10*3/uL — AB (ref 150–400)
RBC: 4.79 MIL/uL (ref 4.22–5.81)
RDW: 12.9 % (ref 11.5–15.5)
WBC: 5.7 10*3/uL (ref 4.0–10.5)
nRBC: 0 % (ref 0.0–0.2)

## 2018-05-22 LAB — GLUCOSE, CAPILLARY
Glucose-Capillary: 79 mg/dL (ref 70–99)
Glucose-Capillary: 76 mg/dL (ref 70–99)

## 2018-05-22 LAB — URINALYSIS, ROUTINE W REFLEX MICROSCOPIC
Bacteria, UA: NONE SEEN
Bilirubin Urine: NEGATIVE
Glucose, UA: >=500 mg/dL — AB
HGB URINE DIPSTICK: NEGATIVE
Ketones, ur: 5 mg/dL — AB
Leukocytes, UA: NEGATIVE
Nitrite: NEGATIVE
Protein, ur: NEGATIVE mg/dL
SPECIFIC GRAVITY, URINE: 1.031 — AB (ref 1.005–1.030)
pH: 6 (ref 5.0–8.0)

## 2018-05-22 LAB — COMPREHENSIVE METABOLIC PANEL
ALK PHOS: 132 U/L — AB (ref 38–126)
ALT: 501 U/L — AB (ref 0–44)
AST: 241 U/L — ABNORMAL HIGH (ref 15–41)
Albumin: 3.6 g/dL (ref 3.5–5.0)
Anion gap: 10 (ref 5–15)
BILIRUBIN TOTAL: 5.6 mg/dL — AB (ref 0.3–1.2)
BUN: 12 mg/dL (ref 8–23)
CALCIUM: 9.2 mg/dL (ref 8.9–10.3)
CHLORIDE: 101 mmol/L (ref 98–111)
CO2: 23 mmol/L (ref 22–32)
CREATININE: 1.05 mg/dL (ref 0.61–1.24)
Glucose, Bld: 273 mg/dL — ABNORMAL HIGH (ref 70–99)
Potassium: 3.5 mmol/L (ref 3.5–5.1)
Sodium: 134 mmol/L — ABNORMAL LOW (ref 135–145)
TOTAL PROTEIN: 7 g/dL (ref 6.5–8.1)

## 2018-05-22 LAB — I-STAT CG4 LACTIC ACID, ED: Lactic Acid, Venous: 1.9 mmol/L (ref 0.5–1.9)

## 2018-05-22 LAB — PROTIME-INR
INR: 1.13
PROTHROMBIN TIME: 14.5 s (ref 11.4–15.2)

## 2018-05-22 LAB — BILIRUBIN, DIRECT: BILIRUBIN DIRECT: 3.3 mg/dL — AB (ref 0.0–0.2)

## 2018-05-22 LAB — ACETAMINOPHEN LEVEL: Acetaminophen (Tylenol), Serum: <10 ug/mL — ABNORMAL LOW (ref 10–30)

## 2018-05-22 LAB — LIPASE, BLOOD: Lipase: 112 U/L — ABNORMAL HIGH (ref 11–51)

## 2018-05-22 MED ORDER — SODIUM CHLORIDE 0.9 % IV SOLN
1000.0000 mL | INTRAVENOUS | Status: AC
  Administered 2018-05-22 – 2018-05-23 (×3): 1000 mL via INTRAVENOUS

## 2018-05-22 MED ORDER — ROSUVASTATIN CALCIUM 5 MG PO TABS
5.0000 mg | ORAL_TABLET | Freq: Every day | ORAL | Status: DC
  Administered 2018-05-23: 5 mg via ORAL
  Filled 2018-05-22 (×2): qty 1

## 2018-05-22 MED ORDER — SENNOSIDES-DOCUSATE SODIUM 8.6-50 MG PO TABS: 1.0000 | ORAL_TABLET | Freq: Every evening | ORAL | Status: DC | PRN

## 2018-05-22 MED ORDER — SODIUM CHLORIDE 0.9 % IV SOLN
1000.0000 mL | INTRAVENOUS | Status: DC
  Administered 2018-05-22: 1000 mL via INTRAVENOUS

## 2018-05-22 MED ORDER — EZETIMIBE 10 MG PO TABS
10.0000 mg | ORAL_TABLET | Freq: Every day | ORAL | Status: DC
  Filled 2018-05-22 (×2): qty 1

## 2018-05-22 MED ORDER — GABAPENTIN 300 MG PO CAPS
600.0000 mg | ORAL_CAPSULE | Freq: Every day | ORAL | Status: DC
  Administered 2018-05-23: 600 mg via ORAL
  Filled 2018-05-22: qty 2

## 2018-05-22 MED ORDER — INSULIN ASPART 100 UNIT/ML ~~LOC~~ SOLN: 0.0000 [IU] | Freq: Three times a day (TID) | SUBCUTANEOUS | Status: DC

## 2018-05-22 MED ORDER — ACETAMINOPHEN 650 MG RE SUPP: 650.0000 mg | Freq: Four times a day (QID) | RECTAL | Status: DC | PRN

## 2018-05-22 MED ORDER — ENOXAPARIN SODIUM 40 MG/0.4ML ~~LOC~~ SOLN
40.0000 mg | SUBCUTANEOUS | Status: DC
  Filled 2018-05-22: qty 0.4

## 2018-05-22 MED ORDER — ACETAMINOPHEN 325 MG PO TABS: 650.0000 mg | ORAL_TABLET | Freq: Four times a day (QID) | ORAL | Status: DC | PRN

## 2018-05-22 MED ORDER — INSULIN ASPART 100 UNIT/ML ~~LOC~~ SOLN: 0.0000 [IU] | Freq: Every day | SUBCUTANEOUS | Status: DC

## 2018-05-22 MED ORDER — SODIUM CHLORIDE 0.9 % IV BOLUS (SEPSIS)
1000.0000 mL | Freq: Once | INTRAVENOUS | Status: AC
  Administered 2018-05-22: 1000 mL via INTRAVENOUS

## 2018-05-22 MED ORDER — CITALOPRAM HYDROBROMIDE 20 MG PO TABS: 40.0000 mg | ORAL_TABLET | Freq: Every day | ORAL | Status: DC

## 2018-05-22 MED ORDER — LOSARTAN POTASSIUM 50 MG PO TABS
50.0000 mg | ORAL_TABLET | Freq: Every day | ORAL | Status: DC
  Administered 2018-05-23: 50 mg via ORAL
  Filled 2018-05-22: qty 1

## 2018-05-22 MED ORDER — ASPIRIN EC 81 MG PO TBEC: 81.0000 mg | DELAYED_RELEASE_TABLET | Freq: Every day | ORAL | Status: DC

## 2018-05-22 NOTE — ED Provider Notes (Signed)
[facility] EMERGENCY DEPARTMENT Provider Note   CSN: [id number] Arrival date & time: 05/22/18  1055     History   Chief Complaint Chief Complaint  Patient presents with  . Emesis    HPI Glenn Stephens is a 62 y.o. male.  HPI Patient had a colonoscopy day before yesterday.  He had a polyp removed.  He did not develop any subsequent rectal bleeding or significant pain at the time.  That evening when he got home, he did start to feel somewhat nauseated and then later in the evening vomited once a large amount.  He did is not go on to have any subsequent vomiting.  He did not have a bowel movement since then.  No bloody bowel movement.  By the next morning he felt better.  He went on to have a day at work.  He did not eat much that day.  One meal only which is atypical per his wife.  He reports just at the time that he was having vomiting in the evening, he had some diffuse generalized upper abdominal discomfort.  That subsequently resolved and he has no abdominal pain now.  He reports that he started to notice he was looking slightly yellow yesterday.  This morning, his wife distinctly noted that he had a yellowish appearance of his skin and his eyes.  Patient has not developed any fever.  He has not had any persisting abdominal pain.  He has had no further vomiting but has not been eating and drinking much.  His urine has become dark.  Patient has no prior history of known hepatic or gallbladder disease.  He does not have a history of pain with eating.  He reports he did take a cruise with his wife to the [place] about 3 weeks ago.  He reports that they did not get off the ship.  He denies that he had any illness at that time. Past Medical History:  Diagnosis Date  . Cancer ([place])    skin cancer  . Diabetes mellitus without complication ([place])   . GERD (gastroesophageal reflux disease)   . Heart murmur   . Hyperlipidemia   . Hypertension   . OSA on CPAP 08/03/2013   pt  does not use CPAP  . Unspecified vitamin D deficiency     Patient Active Problem List   Diagnosis Date Noted  . Cholelithiases 05/22/2018  . Elevated LFTs 05/22/2018  . Jaundice 05/22/2018  . Type 2 diabetes mellitus ([place]) 01/13/2018  . History of posttraumatic stress disorder (PTSD) 06/25/2016  . RLS (restless legs syndrome) 06/25/2016  . Abnormal dreams 04/17/2016  . PLMD (periodic limb movement disorder) 04/17/2016  . Diabetic peripheral neuropathy associated with type 2 diabetes mellitus ([place]) 03/27/2016  . Obesity (BMI 30.0-34.9) 03/24/2015  . Generalized anxiety disorder 03/24/2015  . History of SCC (squamous cell carcinoma) of skin 12/28/2014  . OSA on CPAP 08/03/2013  . Vitamin D deficiency   . Hypertension   . Hyperlipidemia   . GERD (gastroesophageal reflux disease)   . Varicose veins of both lower extremities 02/17/2013    Past Surgical History:  Procedure Laterality Date  . LASER ABLATION     veins  . METATARSAL OSTEOTOMY WITH BUNIONECTOMY    . orthopedic surgeries     knee,foot  . VASECTOMY          Home Medications    Prior to Admission medications   Medication Sig Start Date End Date Taking? Authorizing Provider  acetaminophen (TYLENOL) 500 MG tablet Take 1,000 mg by mouth 2 (two) times daily.   Yes [provider]  aspirin 81 MG tablet Take 81 mg by mouth daily.   Yes [provider]  citalopram (CELEXA) 40 MG tablet TAKE 1 TABLET BY MOUTH DAILY. Patient taking differently: Take 40 mg by mouth daily.  04/28/18  Yes Liane Comber, NP  ezetimibe (ZETIA) 10 MG tablet TAKE 1 TABLET (10 MG TOTAL) BY MOUTH DAILY. 04/18/18 04/18/19 Yes Vicie Mutters, PA-C  gabapentin (NEURONTIN) 300 MG capsule TAKE 1 TO 3 CAPSULES BY MOUTH AT NIGHT FOR RESTLESS LEG Patient taking differently: Take 600 mg by mouth at bedtime.  02/24/18  Yes Vicie Mutters, PA-C  losartan (COZAAR) 50 MG tablet TAKE 1 TABLET (50 MG TOTAL) BY MOUTH DAILY. 04/11/18 04/11/19  Yes Unk Pinto, MD  Magnesium 100 MG CAPS Take 100 mg by mouth daily.    Yes [provider]  Multiple Vitamins-Minerals (ZINC PO) Take 1 tablet by mouth daily.    Yes [provider]  omeprazole (PRILOSEC) 20 MG capsule TAKE 1 CAPSULE BY MOUTH ONCE DAILY Patient taking differently: Take 20 mg by mouth daily.  01/23/18  Yes Unk Pinto, MD  rosuvastatin (CRESTOR) 5 MG tablet Take 1 tablet (5 mg total) by mouth at bedtime. 04/17/18 04/17/19 Yes Collier, Amanda, PA-C  SYNJARDY XR 25-1000 MG TB24 TAKE 1 TABLET BY MOUTH ONCE DAILY Patient taking differently: Take 1 tablet by mouth daily.  04/28/18  Yes Liane Comber, NP  TURMERIC PO Take 1 tablet by mouth daily.    Yes [provider]  blood glucose meter kit and supplies Test sugars once dailyDispense based insurance preference. E11.9 04/17/18   Vicie Mutters, PA-C    Family History Family History  Problem Relation Age of Onset  . Cancer Mother        melanoma with mets   . Heart disease Father   . Hyperlipidemia Father   . Diabetes Sister   . Hypertension Brother   . Stomach cancer Maternal Grandfather   . Colon cancer Neg Hx   . Colon polyps Neg Hx   . Esophageal cancer Neg Hx   . Rectal cancer Neg Hx     Social History Social History   Tobacco Use  . Smoking status: Former Smoker    Last attempt to quit: 06/24/1993    Years since quitting: 24.9  . Smokeless tobacco: Never Used  Substance Use Topics  . Alcohol use: No  . Drug use: No     Allergies   Caduet [amlodipine-atorvastatin] and Statins   Review of Systems Review of Systems 10 Systems reviewed and are negative for acute change except as noted in the HPI.   Physical Exam Updated Vital Signs BP (!) 160/83   Pulse (!) 49   Temp 98.5 F (36.9 C) (Oral)   Resp 13   SpO2 98%   Physical Exam  Constitutional: He is oriented to person, place, and time. He appears well-developed and well-nourished. No distress.  Patient  is visually jaundiced to appearance.  He is nontoxic and alert and otherwise clinically well in appearance.  HENT:  Nose: Nose normal.  Mouth/Throat: Oropharynx is clear and moist.  Eyes: Pupils are equal, round, and reactive to light. EOM are normal. Scleral icterus is present.  Neck: Neck supple.  Cardiovascular:  Heart is regular.  9-2\9 systolic ejection murmur.  Pulmonary/Chest: Effort normal and breath sounds normal.  Abdominal: Soft. He exhibits no distension and no  mass. There is no tenderness. There is no guarding.  Patient's abdomen is completely soft and nontender.  Musculoskeletal: Normal range of motion. He exhibits no edema or tenderness.  Neurological: He is alert and oriented to person, place, and time. No cranial nerve deficit. He exhibits normal muscle tone. Coordination normal.  Skin: Skin is warm and dry.  Patient is jaundiced.  Psychiatric: He has a normal mood and affect.     ED Treatments / Results  Labs (all labs ordered are listed, but only abnormal results are displayed) Labs Reviewed  COMPREHENSIVE METABOLIC PANEL - Abnormal; Notable for the following components:      Result Value   Sodium 134 (*)    Glucose, Bld 273 (*)    AST 241 (*)    ALT 501 (*)    Alkaline Phosphatase 132 (*)    Total Bilirubin 5.6 (*)    All other components within normal limits  LIPASE, BLOOD - Abnormal; Notable for the following components:   Lipase 112 (*)    All other components within normal limits  CBC WITH DIFFERENTIAL/PLATELET - Abnormal; Notable for the following components:   Platelets 139 (*)    All other components within normal limits  URINALYSIS, ROUTINE W REFLEX MICROSCOPIC - Abnormal; Notable for the following components:   Color, Urine AMBER (*)    Specific Gravity, Urine 1.031 (*)    Glucose, UA >=500 (*)    Ketones, ur 5 (*)    All other components within normal limits  BILIRUBIN, DIRECT - Abnormal; Notable for the following components:   Bilirubin,  Direct 3.3 (*)    All other components within normal limits  PROTIME-INR  HEPATITIS PANEL, ACUTE  HIV ANTIBODY (ROUTINE TESTING W REFLEX)  I-STAT CG4 LACTIC ACID, ED  I-STAT CG4 LACTIC ACID, ED    EKG None  Radiology US Abdomen Limited  Result Date: 05/22/2018 CLINICAL DATA:  62 year old male with jaundice.  Recent colonoscopy. EXAM: ULTRASOUND ABDOMEN LIMITED RIGHT UPPER QUADRANT COMPARISON:  None. FINDINGS: Gallbladder: Shadowing gallstones individually up to 19 millimeters diameter. There is a gallstone in the neck of the gallbladder on image 16. There is borderline to mild gallbladder wall thickening of 2-3 millimeters. However, no sonographic Murphy sign was elicited. Common bile duct: Diameter: 4 millimeters, normal. Liver: Echogenic liver (image 29). No discrete liver lesion. No intrahepatic biliary ductal dilatation. Portal vein is patent on color Doppler imaging with normal direction of blood flow towards the liver. Other findings: Negative visible right kidney. IMPRESSION: 1. Appearance suspicious for Acute Cholecystitis: - cholelithiasis, including a stone in the neck of the gallbladder. - mild gallbladder wall thickening, - but no sonographic Murphy sign elicited. 2. No evidence of bile duct obstruction. 3. Fatty liver disease. Electronically Signed   By: Genevie Ann M.D.   On: 05/22/2018 13:19    Procedures Procedures (including critical care time)  Medications Ordered in ED Medications  enoxaparin (LOVENOX) injection 40 mg (has no administration in time range)  senna-docusate (Senokot-S) tablet 1 tablet (has no administration in time range)  insulin aspart (novoLOG) injection 0-15 Units (has no administration in time range)  insulin aspart (novoLOG) injection 0-5 Units (has no administration in time range)  sodium chloride 0.9 % bolus 1,000 mL (0 mLs Intravenous Stopped 05/22/18 1555)    Followed by  0.9 %  sodium chloride infusion (1,000 mLs Intravenous New Bag/Given 05/22/18  1530)  aspirin EC tablet 81 mg (has no administration in time range)  citalopram (CELEXA) tablet 40  mg (has no administration in time range)  ezetimibe (ZETIA) tablet 10 mg (has no administration in time range)  gabapentin (NEURONTIN) capsule 600 mg (has no administration in time range)  losartan (COZAAR) tablet 50 mg (has no administration in time range)  rosuvastatin (CRESTOR) tablet 5 mg (has no administration in time range)     Initial Impression / Assessment and Plan / ED Course  I have reviewed the triage vital signs and the nursing notes.  Pertinent labs & imaging results that were available during my care of the patient were reviewed by me and considered in my medical decision making (see chart for details).  Clinical Course as of May 22 1630  Thu May 22, 2018  1411 Nancy Marus has returned to consult call for low power GI.   [MP]  8022 Consult to general surgery ordered per request of GI consult.   [MP]    Clinical Course User Index [MP] Charlesetta Shanks, MD  Patient presents as outlined above with acute onset of jaundice.  He did not have significant associated pain.  He however has retained stone and elevated LFTs.  Patient will be admitted for further diagnostic evaluation by gastroenterology.   Final Clinical Impressions(s) / ED Diagnoses   Final diagnoses:  Jaundice  Choledocholithiasis    ED Discharge Orders    None       Charlesetta Shanks, MD 05/22/18 [phone]

## 2018-05-22 NOTE — H&P (Signed)
Date: 05/22/2018               Patient Name:  Glenn Stephens MRN: [id number]  DOB: June 14, 1956 Age / Sex: 62 y.o., male   PCP: Unk Pinto, MD         Medical Service: Internal Medicine Teaching Service         Attending Physician: Dr. Charlesetta Shanks, MD    First Contact: Dr. Myrtie Hawk Pager: [phone]  Second Contact: Dr. Tarri Abernethy Pager: [phone]       After Hours (After 5p/  First Contact Pager: [phone]  weekends / holidays): Second Contact Pager: [phone]   Chief Complaint: Yellow skin  History of Present Illness: Glenn Stephens is a 62 year old male with past medical history of DM type II on Synjardy, OSA, HTN, hyperlipidemia, PTSD and GAD, BCC and SCC of his skin, Vit D deficiency, referred to the emergency room by his gastroenterologist complaining of yellow skin.  Patient had been in colonoscopy on Tuesday.  Later on on the evening, he felt fatigue and nauseated with some bloating on his abdomen.  He also noticed some dark urine that has been going on since then.  Next day, he has a skin got mildly yellow he had some vomiting but is not sure what color or blood content has because it was dark in the room.  He said that he got 4 mg of Zofran for that and initially felt better.  However this morning, his wife noticed that his skin is more yellow.  They contacted his gastroenterologist Dr. Carmin Muskrat and he recommended to come to ED. Patient denies any abdominal pain. No fever. No shortness of breath. Also no itching, no rash. Has had low appetite since colonoscopy. He mentions that he has been on his normal health before colonoscopy. No weight loss. No Hx of abdominal pain.  Patient denies any change in his medication, However, in last month, he has taken 2000 mg Tylenol, daily for his arthritis.(2x500 mg BID). He did take [place] Cruise few weeks ago with her wife but but they did not get off of the ship.   ED course: Patient did not have any complaints other than the yellow skin on  arrival.  Was afebrile with stable vital sign stable on arrival. Lab work-up showed direct hyperbilirubinemia (with total bilirubin at 5.6), AST at 241, ALT 501, elevated lipase at 112, elevated alkaline phosphatase at 132.  No leukocytosis.  Normal lactic acid. GI from [facility] consulted. Abdominal ultrasound showed cholelithiasis and neck of gallbladder and suspicious for acute cholecystitis. Patient admitted at Internal medicine teaching service.  Meds:  Current Meds  Medication Sig  . acetaminophen (TYLENOL) 500 MG tablet Take 1,000 mg by mouth 2 (two) times daily.  Marland Kitchen aspirin 81 MG tablet Take 81 mg by mouth daily.  . citalopram (CELEXA) 40 MG tablet TAKE 1 TABLET BY MOUTH DAILY. (Patient taking differently: Take 40 mg by mouth daily. )  . ezetimibe (ZETIA) 10 MG tablet TAKE 1 TABLET (10 MG TOTAL) BY MOUTH DAILY.  Marland Kitchen gabapentin (NEURONTIN) 300 MG capsule TAKE 1 TO 3 CAPSULES BY MOUTH AT NIGHT FOR RESTLESS LEG (Patient taking differently: Take 600 mg by mouth at bedtime. )  . losartan (COZAAR) 50 MG tablet TAKE 1 TABLET (50 MG TOTAL) BY MOUTH DAILY.  . Magnesium 100 MG CAPS Take 100 mg by mouth daily.   . Multiple Vitamins-Minerals (ZINC PO) Take 1 tablet by mouth daily.   Marland Kitchen omeprazole (PRILOSEC) 20 MG capsule  TAKE 1 CAPSULE BY MOUTH ONCE DAILY (Patient taking differently: Take 20 mg by mouth daily. )  . rosuvastatin (CRESTOR) 5 MG tablet Take 1 tablet (5 mg total) by mouth at bedtime.  Marland Kitchen SYNJARDY XR 25-1000 MG TB24 TAKE 1 TABLET BY MOUTH ONCE DAILY (Patient taking differently: Take 1 tablet by mouth daily. )  . TURMERIC PO Take 1 tablet by mouth daily.      Allergies: Allergies as of 05/22/2018 - Review Complete 05/22/2018  Allergen Reaction Noted  . Caduet [amlodipine-atorvastatin] Hives and Itching 02/17/2013  . Statins  06/02/2013   Past Medical History:  Diagnosis Date  . Cancer ([place])    skin cancer  . Diabetes mellitus without complication ([place])   . GERD (gastroesophageal  reflux disease)   . Heart murmur   . Hyperlipidemia   . Hypertension   . OSA on CPAP 08/03/2013   pt does not use CPAP  . Unspecified vitamin D deficiency     Family History:  Heart disease in father DM in sister HTN in brother Melanoma in mother Stomach cancer in maternal Grandmother  Social History:  Denies smoking Denies EtOh use Denies illicit drug use  Review of Systems: A complete ROS was negative except as per HPI.   Physical Exam: Blood pressure (!) 160/83, pulse (!) 50, temperature 98.5 F (36.9 C), temperature source Oral, resp. rate 17, SpO2 100 %. Physical Exam  Constitutional: He is oriented to person, place, and time. He appears well-developed and well-nourished. No distress.  HENT:  Head: Atraumatic.  Eyes: Pupils are equal, round, and reactive to light. EOM are normal. Scleral icterus is present.  Cardiovascular: Normal rate and regular rhythm.  Systolic Murmur heard at A and P area. Pulmonary/Chest: Effort normal and breath sounds normal. No stridor. No respiratory distress. He has no wheezes. He has no rales.  Abdominal: Soft. Bowel sounds are normal. He exhibits no distension and no ascites. There is no hepatomegaly. There is no tenderness. There is no rigidity, no rebound and no guarding.  Musculoskeletal: He exhibits no edema or tenderness.  Neurological: He is alert and oriented to person, place, and time. He exhibits normal muscle tone.  Skin: Skin is warm and dry. No rash noted.  Psychiatric: He has a normal mood and affect. His behavior is normal. Judgment normal.    CMP Latest Ref Rng & Units 05/22/2018 04/17/2018 01/15/2018  Glucose 70 - 99 mg/dL 273(H) 154(H) 160(H)  BUN 8 - 23 mg/dL 12 15 15   Creatinine 0.61 - 1.24 mg/dL 1.05 0.95 0.98  Sodium 135 - 145 mmol/L 134(L) 139 137  Potassium 3.5 - 5.1 mmol/L 3.5 4.2 4.3  Chloride 98 - 111 mmol/L 101 102 101  CO2 22 - 32 mmol/L 23 28 25   Calcium 8.9 - 10.3 mg/dL 9.2 9.7 9.6  Total Protein 6.5 -  8.1 g/dL 7.0 7.1 7.0  Total Bilirubin 0.3 - 1.2 mg/dL 5.6(H) 0.8 0.9  Alkaline Phos 38 - 126 U/L 132(H) - -  AST 15 - 41 U/L 241(H) 21 27  ALT 0 - 44 U/L 501(H) 36 35  Assessment & Plan by Problem: Active Problems:   * No active hospital problems. * Glenn Stephens is a 62 year old male with past medical history of DM type II on Synjardy, OSA, HTN, hyperlipidemia, PTSD and GAD, BCC and SCC of his skin, Vit D deficiency, referred to the emergency room by his gastroenterologist complaining of yellow skin, he had screening colonoscopy last Tuesday.  New onset jaundice,  asymptomatic direct hyperbilirubinemia Moderately elevated LFT, lipase and mild elevation of alkaline phosphatase Intrahepatic vs extrahepatic pathology (Extrahepatic is less likely as the ALKP is not significantly high) Abdominal ultrasound: Gallstone in the neck of gallbladder with borderline to mild gallbladder wall thickening Normal common bile duct diameter No intra-hepatitic biliary ductal dilation Fatty liver disease  Patient symptoms and presentation likely due to Cholelithiasis and intrahepatic biliary obstruction. We will also evaluate for intrahepatic cholestasis vs hepatocellular injury   -MRCP -GI and general surgery are on board.  May proceed for lab [place] this admission appreciate their recommendation -Lipase tomorrow -NPO -125 ml/h (has got NS 1 li bolus) -I-STAT lactic acid -ANA, Antismooth Ab, mitochondrial Ab -CMP now and daily  -Tylenol level -Hepatitis panel  Type 2 DM: On Synjardy XR 25-1000 mg daily at home -Moderate SSI -CBG monitoring -C/w home regimen of ASA 81 mg QD  HLD: -Continue with home regimen of Crestor 5 mg daily and Zetia 10 mg daily  HTN: -Continue home regimen of losartan 50 mg daily  -Monitor VS  Restless leg syndrome: Is on gabapentin 600 mL daily at home -Continue home regimen of gabapentin 600 mL daily  II/VI systolic murmur at A and P area: Patient denies any  DOE. No echo in chart -Recommend TTE outpatient  Diet: NPO IV fluid: NS VTE ppx: Lovenox Code status: Full  Dispo: Admit patient to Inpatient with expected length of stay greater than 2 midnights.  SignedDewayne Hatch, MD 05/22/2018, 2:27 PM  Pager: [phone]

## 2018-05-22 NOTE — ED Notes (Signed)
42 back from CT

## 2018-05-22 NOTE — Consult Note (Addendum)
[facility] Consult: 2:12 PM 05/22/2018  LOS: 0 days    Referring Provider: Dr. Vallery Ridge in the ED Primary Care Physician:  Unk Pinto, MD Primary Gastroenterologist:  Dr. Rush Landmark     Reason for Consultation: Jaundice, choledocholithiasis.   HPI: Glenn Stephens is a 62 y.o. male.  PMH type 2 DM.  OSA, not on CPAP.  GERD.  Skin cancer. Normal colonoscopy in 2008 (Dr Benson Norway)  Just underwent screening colonoscopy on Tuesday 05/20/2018.  Fair prep.  Nonthrombosed, nonbleeding internal hemorrhoids.  4 mm polyp in the transverse colon resected/retrieved.  Sigmoid diverticulosis.  Pathology not yet resulted. He felt fine after the procedure.  That night he developed upper abdominal distention and eventually nausea and vomiting of bilious material.  No intense or focal abd pain.  Urine deep yellow.  Next day, Wednesday, with noticed jaundice and pt's urine tea-colored.  Anorexia and eating very little.  No recurrent abd pain.  No dyspnea, fever, chills, pruritus.   Overnight the jaundiced progressed and he was advised to go to the ED.  Remains anorexic, fatigued, malaised but has not had recurrent abdominal pain.  No stools but then he has eaten maybe 2 small meals since the procedure. Leading up to the colonoscopy he had a great appetite.  Had no trouble with the bowel prep.  He does not drink alcohol.  Stable meds for at least a year.    T bili 5.6; direct 3.3.  Alkaline phosphatase 132.  AST/ALT 241/501.  Lipase 112. As recently as 04/2018 and on 3 previous lab draws this year, LFTs normal (isolated reading of ALT 47 in 10/2017).   PT/INR normal.   WBCs normal.  No anemia.  Platelets slightly low at 139.   Abdominal ultrasound shows gallbladder appearance suspicious for acute cholecystitis as there is  cholelithiasis up to 19 mm diameter, a stone in the neck of the gallbladder and gallbladder wall thickening.  Fatty liver.  CBD 4 mm.  EMT, drives ambulance.    Past Medical History:  Diagnosis Date  . Cancer ([place])    skin cancer  . Diabetes mellitus without complication ([address])   . GERD (gastroesophageal reflux disease)   . Heart murmur   . Hyperlipidemia   . Hypertension   . OSA on CPAP 08/03/2013   pt does not use CPAP  . Unspecified vitamin D deficiency     Past Surgical History:  Procedure Laterality Date  . LASER ABLATION     veins  . METATARSAL OSTEOTOMY WITH BUNIONECTOMY    . orthopedic surgeries     knee,foot  . VASECTOMY      Prior to Admission medications   Medication Sig Start Date End Date Taking? Authorizing Provider  acetaminophen (TYLENOL) 500 MG tablet Take 1,000 mg by mouth 2 (two) times daily.   Yes [provider]  aspirin 81 MG tablet Take 81 mg by mouth daily.   Yes [provider]  citalopram (CELEXA) 40 MG tablet TAKE 1 TABLET BY MOUTH DAILY. Patient taking differently: Take 40 mg by mouth daily.  04/28/18  Yes Liane Comber, NP  ezetimibe (ZETIA) 10 MG tablet TAKE 1 TABLET (10 MG TOTAL) BY MOUTH DAILY. 04/18/18 04/18/19 Yes Vicie Mutters, PA-C  gabapentin (NEURONTIN) 300 MG capsule TAKE 1 TO 3 CAPSULES BY MOUTH AT NIGHT FOR RESTLESS LEG Patient taking differently: Take 600 mg by mouth at bedtime.  02/24/18  Yes Vicie Mutters, PA-C  losartan (COZAAR) 50 MG tablet TAKE 1 TABLET (50 MG TOTAL) BY MOUTH DAILY. 04/11/18 04/11/19 Yes Unk Pinto, MD  Magnesium 100 MG CAPS Take 100 mg by mouth daily.    Yes [provider]  Multiple Vitamins-Minerals (ZINC PO) Take 1 tablet by mouth daily.    Yes [provider]  omeprazole (PRILOSEC) 20 MG capsule TAKE 1 CAPSULE BY MOUTH ONCE DAILY Patient taking differently: Take 20 mg by mouth daily.  01/23/18  Yes Unk Pinto, MD  rosuvastatin (CRESTOR) 5 MG tablet Take 1  tablet (5 mg total) by mouth at bedtime. 04/17/18 04/17/19 Yes Collier, Amanda, PA-C  SYNJARDY XR 25-1000 MG TB24 TAKE 1 TABLET BY MOUTH ONCE DAILY Patient taking differently: Take 1 tablet by mouth daily.  04/28/18  Yes Liane Comber, NP  TURMERIC PO Take 1 tablet by mouth daily.    Yes [provider]  blood glucose meter kit and supplies Test sugars once dailyDispense based insurance preference. E11.9 04/17/18   Vicie Mutters, PA-C    Scheduled Meds:  Infusions: . sodium chloride 1,000 mL (05/22/18 1143)   PRN Meds:    Allergies as of 05/22/2018 - Review Complete 05/22/2018  Allergen Reaction Noted  . Caduet [amlodipine-atorvastatin] Hives and Itching 02/17/2013  . Statins  06/02/2013    Family History  Problem Relation Age of Onset  . Cancer Mother        melanoma with mets   . Heart disease Father   . Hyperlipidemia Father   . Diabetes Sister   . Hypertension Brother   . Stomach cancer Maternal Grandfather   . Colon cancer Neg Hx   . Colon polyps Neg Hx   . Esophageal cancer Neg Hx   . Rectal cancer Neg Hx     Social History   Socioeconomic History  . Marital status: Married    Spouse name: Not on file  . Number of children: Not on file  . Years of education: Not on file  . Highest education level: Not on file  Occupational History  . Not on file  Social Needs  . Financial resource strain: Not on file  . Food insecurity:    Worry: Not on file    Inability: Not on file  . Transportation needs:    Medical: Not on file    Non-medical: Not on file  Tobacco Use  . Smoking status: Former Smoker    Last attempt to quit: 06/24/1993    Years since quitting: 24.9  . Smokeless tobacco: Never Used  Substance and Sexual Activity  . Alcohol use: No  . Drug use: No  . Sexual activity: Not on file  Lifestyle  . Physical activity:    Days per week: Not on file    Minutes per session: Not on file  . Stress: Not on file  Relationships  . Social  connections:    Talks on phone: Not on file    Gets together: Not on file    Attends religious service: Not on file    Active member of club or organization: Not on file    Attends meetings of clubs  or organizations: Not on file    Relationship status: Not on file  . Intimate partner violence:    Fear of current or ex partner: Not on file    Emotionally abused: Not on file    Physically abused: Not on file    Forced sexual activity: Not on file  Other Topics Concern  . Not on file  Social History Narrative  . Not on file    REVIEW OF SYSTEMS: Constitutional: Per HPI. ENT:  No nose bleeds Pulm: No difficulty breathing. CV:  No palpitations, no LE edema.  GU:  No hematuria, no frequency GI: Per HPI Heme: She will bleeding or bruising. Transfusions: None Neuro:  No headaches, no peripheral tingling or numbness Derm:  No itching, no rash or sores.  Endocrine:  No sweats or chills.  No polyuria or dysuria Immunization: Did not ask him about his vaccination history. Travel:  None beyond local counties in last few months.    PHYSICAL EXAM: Vital signs in last 24 hours: Vitals:   05/22/18 1330 05/22/18 1345  BP: 126/63 (!) 151/79  Pulse: (!) 51 (!) 51  Resp: 16 16  Temp:    SpO2: 97% 100%   Wt Readings from Last 3 Encounters:  05/20/18 109.3 kg  05/06/18 110.7 kg  04/17/18 109.6 kg    General: Pleasant, mildly jaundiced and icteric WM.  Looks very mildly ill and not chronically ill.  Sitting on the stretcher, comfortable. Head: No facial asymmetry or swelling.  No signs of head trauma. Eyes: Slight scleral icterus.  No conjunctival pallor.  EOMI. Ears: Not hard of hearing. Nose: No discharge or congestion Mouth: Moist, pink, clear oral mucosa.  Tongue midline. Neck: No JVD, no masses, no thyromegaly. Lungs: Clear bilaterally.  No labored breathing or cough. Heart: RRR.  No MRG.  S1, S2 present. Abdomen: Soft.  Not tender or distended.  No HSM, masses, bruits,  hernias.  Bowel sounds active..   Rectal: Deferred Musc/Skeltl: No joint swelling or contracture deformities. Extremities: No CCE. Neurologic: Alert.  Oriented x3.  No limb weakness or tremors.  No gross deficits. Skin: Subtle jaundice.  No telangiectasia, no rashes. Tattoos: None observed. Nodes: No cervical adenopathy Psych: Pleasant, calm, cooperative.  Intake/Output from previous day: No intake/output data recorded. Intake/Output this shift: No intake/output data recorded.  LAB RESULTS: Recent Labs    05/22/18 1134  WBC 5.7  HGB 13.8  HCT 42.8  PLT 139*   BMET Lab Results  Component Value Date   NA 134 (L) 05/22/2018   NA 139 04/17/2018   NA 137 01/15/2018   K 3.5 05/22/2018   K 4.2 04/17/2018   K 4.3 01/15/2018   CL 101 05/22/2018   CL 102 04/17/2018   CL 101 01/15/2018   CO2 23 05/22/2018   CO2 28 04/17/2018   CO2 25 01/15/2018   GLUCOSE 273 (H) 05/22/2018   GLUCOSE 154 (H) 04/17/2018   GLUCOSE 160 (H) 01/15/2018   BUN 12 05/22/2018   BUN 15 04/17/2018   BUN 15 01/15/2018   CREATININE 1.05 05/22/2018   CREATININE 0.95 04/17/2018   CREATININE 0.98 01/15/2018   CALCIUM 9.2 05/22/2018   CALCIUM 9.7 04/17/2018   CALCIUM 9.6 01/15/2018   LFT Recent Labs    05/22/18 1134  PROT 7.0  ALBUMIN 3.6  AST 241*  ALT 501*  ALKPHOS 132*  BILITOT 5.6*  BILIDIR 3.3*   PT/INR Lab Results  Component Value Date   INR 1.13 05/22/2018  Hepatitis Panel No results for input(s): HEPBSAG, HCVAB, HEPAIGM, HEPBIGM in the last 72 hours. C-Diff No components found for: CDIFF Lipase     Component Value Date/Time   LIPASE 112 (H) 05/22/2018 1134    Drugs of Abuse  No results found for: LABOPIA, COCAINSCRNUR, LABBENZ, AMPHETMU, THCU, LABBARB   RADIOLOGY STUDIES: US Abdomen Limited  Result Date: 05/22/2018 CLINICAL DATA:  62 year old male with jaundice.  Recent colonoscopy. EXAM: ULTRASOUND ABDOMEN LIMITED RIGHT UPPER QUADRANT COMPARISON:  None. FINDINGS:  Gallbladder: Shadowing gallstones individually up to 19 millimeters diameter. There is a gallstone in the neck of the gallbladder on image 16. There is borderline to mild gallbladder wall thickening of 2-3 millimeters. However, no sonographic Murphy sign was elicited. Common bile duct: Diameter: 4 millimeters, normal. Liver: Echogenic liver (image 29). No discrete liver lesion. No intrahepatic biliary ductal dilatation. Portal vein is patent on color Doppler imaging with normal direction of blood flow towards the liver. Other findings: Negative visible right kidney. IMPRESSION: 1. Appearance suspicious for Acute Cholecystitis: - cholelithiasis, including a stone in the neck of the gallbladder. - mild gallbladder wall thickening, - but no sonographic Murphy sign elicited. 2. No evidence of bile duct obstruction. 3. Fatty liver disease. Electronically Signed   By: Genevie Ann M.D.   On: 05/22/2018 13:19     IMPRESSION:   *  Jaundice acutely, occurring within 24 hours of uneventful colonoscopy/polypectomy.   Cholelithiasis and s/o cholecystitis, fatty liver on ultrasound.  CBD not dilated.  ? DILI.    *   Mild lipase elevation.   Raises ? Of mild, (biliary) pancreatitis.     *  Colon polypectomy 3 d ago.  Path not yet resulted.      PLAN:     *  MRCP.   Tentatively set him up for ERCP tomorrow at 10 AM but final decision to proceed with this based on MRCP.  *   Okay to have full liquids after the MRCP has completed but needs to be n.p.o. after midnight in case of ERCP.    *  Surgical consult,   *  ANA, smooth muscle ab, AMA to r/o AIH   Azucena Freed  05/22/2018, 2:12 PM Phone [phone]   Attending physician's note   I have taken an interval history, reviewed the chart and examined the patient. I agree with the Advanced Practitioner's note, impression and recommendations.   62 year old with painless obstructive jaundice.  US showing cholelithiasis but normal CBD.  Mild increase in  lipase.  Highly suspicious of choledocholithiasis, need to rule out any pancreatic masses. No clinical acute cholecystitis/ascending cholangitis or pancreatitis.  Incidentally had screening colonoscopy 3 days ago. Plan: Proceed with MRCP.  If positive, ERCP in a.m..  Surgery consultation in a.m. hepatitis work-up in progress.  Carmell Austria, MD

## 2018-05-22 NOTE — ED Notes (Signed)
Ready for transport.  

## 2018-05-22 NOTE — ED Triage Notes (Signed)
Pt presents for evaluation of emesis/nausea with dark urine and yellow skin color after colonoscopy on Tuesday. States had one polyp removed, no rectal bleeding or blood in emesis.

## 2018-05-22 NOTE — Telephone Encounter (Signed)
I had seen that the patient was feeling well upon discussion in the afternoon with my nurse. I heard from my staff about the patient having yellow jaundice reported today. I am not clear as to why this would be occurring at this point in time however if he is having biliary pathology or a reaction to the propafol if his LFTs and urine confirm elevated LFTs.  This does require further workup. I called and left a message on the patient's wife's cell phone and I will try and reach them this afternoon. I see the patient is currently in the [facility], ED with laboratories that are currently pending. I will try and reach the patient this afternoon.  I will make aware the inpatient GI service at [facility] in case a consultation is required.  Justice Britain, MD [facility] Advanced Endoscopy Office # [phone]

## 2018-05-22 NOTE — ED Notes (Signed)
Admitting at the bedside.  

## 2018-05-22 NOTE — Telephone Encounter (Signed)
I just got off the phone with the patient's wife.  Patient's labs are still pending currently.  There is plan for an abdominal ultrasound that is ordered but he has not gone for that yet.  The patient's wife does recount that he was doing better yesterday in the afternoon feeling well but only had one meal but drank pretty significant amounts of fluids this tries to hydrated.  This morning had breakfast and noticed that he was more yellow in his face and his wife confirm this when they went outside.  Subsequently reached out to our office and primary care doctor office was asked to be evaluated in the ED.  Patient has no abdominal pain there is no history of biliary colic that I can get over the phone.  This was a direct procedure colonoscopy so I did not have a chance to talk with him in more depth.  We will see with his liver tests and urine show as well as what his ultrasound shows regards to what the next steps of evaluation should be about the concern of biliary obstruction versus intrinsic liver disease.  If the patient remains hospitalized the inpatient GI service from [facility] should be called to discuss what next steps of evaluation may be.   Justice Britain, MD [facility] Advanced Endoscopy Office # [phone]

## 2018-05-22 NOTE — Consult Note (Signed)
Reason for Consult:  Jaundice Referring Physician: M pHeiffer  Glenn Stephens is an 62 y.o. male.    CC: emesis/nausea with dark urine and yellow skin color after colonoscopy   HPI: Patient is a 62-year-old underwent colonoscopy 2 days ago with polyp removal.  That evening he developed nausea and vomited x1.  He went to work but still felt bad the next day.  He started vomiting again in the evening and complained of generalized abdominal discomfort.  He ultimately came to the ED because of jaundice appearance which was noted by his wife.   Work-up shows he is afebrile and vital signs are stable.  Blood pressures up slightly.  Total bilirubin is 5.6 ALT 501, AST 241, lipase 112, alk phos 132.  Glucose 273, sodium 134.  Creatinine 1.05.  WBC 5.7 hemoglobin 13.8, 42.8 hematocrit platelets 139,000.  Urinalysis unremarkable.  Abdominal ultrasound shows gallstones measuring up to 19 mm.  There is a gallstone in the neck of the gallbladder mild gallbladder wall thickening 2 to 3 mm.  Common bile duct was 4 mm.  GI has been called to reevaluate the patient for biliary disease versus hepatic disease.  We are asked to see to evaluate for possible cholecystitis/cholelithiasis.  Past Medical History:  Diagnosis Date  . Cancer (HCC)    skin cancer  . Diabetes mellitus without complication (HCC)   . GERD (gastroesophageal reflux disease)   . Heart murmur   . Hyperlipidemia   . Hypertension   . OSA on CPAP 08/03/2013   pt does not use CPAP  . Unspecified vitamin D deficiency     Past Surgical History:  Procedure Laterality Date  . LASER ABLATION     veins  . METATARSAL OSTEOTOMY WITH BUNIONECTOMY    . orthopedic surgeries     knee,foot  . VASECTOMY      Family History  Problem Relation Age of Onset  . Cancer Mother        melanoma with mets   . Heart disease Father   . Hyperlipidemia Father   . Diabetes Sister   . Hypertension Brother   . Stomach cancer Maternal Grandfather   . Colon  cancer Neg Hx   . Colon polyps Neg Hx   . Esophageal cancer Neg Hx   . Rectal cancer Neg Hx     Social History:  reports that he quit smoking about 24 years ago. He has never used smokeless tobacco. He reports that he does not drink alcohol or use drugs. 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  Allergies:  Allergies  Allergen Reactions  . Caduet [Amlodipine-Atorvastatin] Hives and Itching  . Statins     BLURRED VISION   Prior to Admission medications   Medication Sig Start Date End Date Taking? Authorizing Provider  acetaminophen (TYLENOL) 500 MG tablet Take 1,000 mg by mouth 2 (two) times daily.   Yes [provider]  aspirin 81 MG tablet Take 81 mg by mouth daily.   Yes [provider]  citalopram (CELEXA) 40 MG tablet TAKE 1 TABLET BY MOUTH DAILY. Patient taking differently: Take 40 mg by mouth daily.  04/28/18  Yes Corbett, Ashley, NP  ezetimibe (ZETIA) 10 MG tablet TAKE 1 TABLET (10 MG TOTAL) BY MOUTH DAILY. 04/18/18 04/18/19 Yes Collier, Amanda, PA-C  gabapentin (NEURONTIN) 300 MG capsule TAKE 1 TO 3 CAPSULES BY MOUTH AT NIGHT FOR RESTLESS LEG Patient taking differently: Take 600 mg by mouth at bedtime.  02/24/18  Yes Collier, Amanda, PA-C  losartan (  COZAAR) 50 MG tablet TAKE 1 TABLET (50 MG TOTAL) BY MOUTH DAILY. 04/11/18 04/11/19 Yes McKeown, William, MD  Magnesium 100 MG CAPS Take 100 mg by mouth daily.    Yes [provider]  Multiple Vitamins-Minerals (ZINC PO) Take 1 tablet by mouth daily.    Yes [provider]  omeprazole (PRILOSEC) 20 MG capsule TAKE 1 CAPSULE BY MOUTH ONCE DAILY Patient taking differently: Take 20 mg by mouth daily.  01/23/18  Yes McKeown, William, MD  rosuvastatin (CRESTOR) 5 MG tablet Take 1 tablet (5 mg total) by mouth at bedtime. 04/17/18 04/17/19 Yes Collier, Amanda, PA-C  SYNJARDY XR 25-1000 MG TB24 TAKE 1 TABLET BY MOUTH ONCE DAILY Patient taking differently: Take 1 tablet by mouth daily.  04/28/18  Yes Corbett,  Ashley, NP  TURMERIC PO Take 1 tablet by mouth daily.    Yes [provider]  blood glucose meter kit and supplies Test sugars once dailyDispense based insurance preference. E11.9 04/17/18   Collier, Amanda, PA-C      Continuous: . sodium chloride 1,000 mL (05/22/18 1143)   Anti-infectives (From admission, onward)   None      Results for orders placed or performed during the hospital encounter of 05/22/18 (from the past 48 hour(s))  Comprehensive metabolic panel     Status: Abnormal   Collection Time: 05/22/18 11:34 AM  Result Value Ref Range   Sodium 134 (L) 135 - 145 mmol/L   Potassium 3.5 3.5 - 5.1 mmol/L   Chloride 101 98 - 111 mmol/L   CO2 23 22 - 32 mmol/L   Glucose, Bld 273 (H) 70 - 99 mg/dL   BUN 12 8 - 23 mg/dL   Creatinine, Ser 1.05 0.61 - 1.24 mg/dL   Calcium 9.2 8.9 - 10.3 mg/dL   Total Protein 7.0 6.5 - 8.1 g/dL   Albumin 3.6 3.5 - 5.0 g/dL   AST 241 (H) 15 - 41 U/L   ALT 501 (H) 0 - 44 U/L   Alkaline Phosphatase 132 (H) 38 - 126 U/L   Total Bilirubin 5.6 (H) 0.3 - 1.2 mg/dL   GFR calc non Af Amer >60 >60 mL/min   GFR calc Af Amer >60 >60 mL/min    Comment: (NOTE) The eGFR has been calculated using the CKD EPI equation. This calculation has not been validated in all clinical situations. eGFR's persistently <60 mL/min signify possible Chronic Kidney Disease.    Anion gap 10 5 - 15    Comment: Performed at [facility], [address] [place], [place] [address]  Lipase, blood     Status: Abnormal   Collection Time: 05/22/18 11:34 AM  Result Value Ref Range   Lipase 112 (H) 11 - 51 U/L    Comment: Performed at [facility], [address] [place], [place] [address]  CBC with Differential     Status: Abnormal   Collection Time: 05/22/18 11:34 AM  Result Value Ref Range   WBC 5.7 4.0 - 10.5 K/uL   RBC 4.79 4.22 - 5.81 MIL/uL   Hemoglobin 13.8 13.0 - 17.0 g/dL   HCT 42.8 39.0 - 52.0 %   MCV 89.4 80.0 - 100.0 fL   MCH 28.8 26.0 -  34.0 pg   MCHC 32.2 30.0 - 36.0 g/dL   RDW 12.9 11.5 - 15.5 %   Platelets 139 (L) 150 - 400 K/uL   nRBC 0.0 0.0 - 0.2 %   Neutrophils Relative % 71 %     Neutro Abs 4.0 1.7 - 7.7 K/uL   Lymphocytes Relative 20 %   Lymphs Abs 1.1 0.7 - 4.0 K/uL   Monocytes Relative 7 %   Monocytes Absolute 0.4 0.1 - 1.0 K/uL   Eosinophils Relative 2 %   Eosinophils Absolute 0.1 0.0 - 0.5 K/uL   Basophils Relative 0 %   Basophils Absolute 0.0 0.0 - 0.1 K/uL   Immature Granulocytes 0 %   Abs Immature Granulocytes 0.01 0.00 - 0.07 K/uL    Comment: Performed at [facility], [address] [place], [place] [address]  Protime-INR     Status: None   Collection Time: 05/22/18 11:34 AM  Result Value Ref Range   Prothrombin Time 14.5 11.4 - 15.2 seconds   INR 1.13     Comment: Performed at [facility], [address] [place], [place] [address]  Urinalysis, Routine w reflex microscopic     Status: Abnormal   Collection Time: 05/22/18 11:34 AM  Result Value Ref Range   Color, Urine AMBER (A) YELLOW    Comment: BIOCHEMICALS MAY BE AFFECTED BY COLOR   APPearance CLEAR CLEAR   Specific Gravity, Urine 1.031 (H) 1.005 - 1.030   pH 6.0 5.0 - 8.0   Glucose, UA >=500 (A) NEGATIVE mg/dL   Hgb urine dipstick NEGATIVE NEGATIVE   Bilirubin Urine NEGATIVE NEGATIVE   Ketones, ur 5 (A) NEGATIVE mg/dL   Protein, ur NEGATIVE NEGATIVE mg/dL   Nitrite NEGATIVE NEGATIVE   Leukocytes, UA NEGATIVE NEGATIVE   RBC / HPF 0-5 0 - 5 RBC/hpf   WBC, UA 0-5 0 - 5 WBC/hpf   Bacteria, UA NONE SEEN NONE SEEN   Squamous Epithelial / LPF 0-5 0 - 5    Comment: Performed at [facility], [address] [place], [place] [address]  Bilirubin, direct     Status: Abnormal   Collection Time: 05/22/18 11:34 AM  Result Value Ref Range   Bilirubin, Direct 3.3 (H) 0.0 - 0.2 mg/dL    Comment: Performed at [facility], [address] [place], [place] [address]  I-Stat CG4 Lactic Acid, ED     Status: None    Collection Time: 05/22/18 11:54 AM  Result Value Ref Range   Lactic Acid, Venous 1.90 0.5 - 1.9 mmol/L    Us Abdomen Limited  Result Date: 05/22/2018 CLINICAL DATA:  62-year-old male with jaundice.  Recent colonoscopy. EXAM: ULTRASOUND ABDOMEN LIMITED RIGHT UPPER QUADRANT COMPARISON:  None. FINDINGS: Gallbladder: Shadowing gallstones individually up to 19 millimeters diameter. There is a gallstone in the neck of the gallbladder on image 16. There is borderline to mild gallbladder wall thickening of 2-3 millimeters. However, no sonographic Murphy sign was elicited. Common bile duct: Diameter: 4 millimeters, normal. Liver: Echogenic liver (image 29). No discrete liver lesion. No intrahepatic biliary ductal dilatation. Portal vein is patent on color Doppler imaging with normal direction of blood flow towards the liver. Other findings: Negative visible right kidney. IMPRESSION: 1. Appearance suspicious for Acute Cholecystitis: - cholelithiasis, including a stone in the neck of the gallbladder. - mild gallbladder wall thickening, - but no sonographic Murphy sign elicited. 2. No evidence of bile duct obstruction. 3. Fatty liver disease. Electronically Signed   By: H  Hall M.D.   On: 05/22/2018 13:19    Review of Systems  Constitutional: Negative.   HENT: Negative.   Eyes: Negative.   Respiratory: Negative.   Cardiovascular: Positive for leg swelling (up all day on   feet they will swell).  Gastrointestinal: Positive for nausea and vomiting. Negative for abdominal pain, blood in stool, constipation, diarrhea and melena.       Only one BM since colonoscopy Tuesday 05/20/18.  Genitourinary: Negative.   Musculoskeletal: Negative.   Skin:       Wife noted Jaundice  Neurological: Negative.   Endo/Heme/Allergies: Negative.   Psychiatric/Behavioral: Negative.    Blood pressure (!) 160/83, pulse (!) 49, temperature 98.5 F (36.9 C), temperature source Oral, resp. rate 13, SpO2 98 %. Physical Exam   Constitutional: He is oriented to person, place, and time. He appears well-developed and well-nourished. No distress.  HENT:  Head: Normocephalic.  Mouth/Throat: Oropharynx is clear and moist. No oropharyngeal exudate.  Eyes: Right eye exhibits no discharge. Left eye exhibits no discharge. Scleral icterus (very mild, but his wife noticed it) is present.  Pupils are equal  Neck: Normal range of motion. Neck supple. No JVD present. No tracheal deviation present. No thyromegaly present.  Cardiovascular: Normal rate, regular rhythm, normal heart sounds and intact distal pulses.  No murmur heard. Respiratory: Effort normal and breath sounds normal. No respiratory distress. He has no wheezes. He has no rales. He exhibits no tenderness.  GI: Soft. Bowel sounds are normal. He exhibits no distension and no mass. There is no tenderness. There is no rebound and no guarding.  Musculoskeletal: He exhibits no edema or tenderness.  Lymphadenopathy:    He has no cervical adenopathy.  Neurological: He is alert and oriented to person, place, and time. A cranial nerve deficit is present.  Skin: Skin is warm and dry. No rash noted. He is not diaphoretic. No erythema. No pallor.  Psychiatric: He has a normal mood and affect. His behavior is normal. Judgment and thought content normal.    Assessment/Plan: Nausea, vomiting, Jaundice with elevated LFT's Cholelithiasis  Colonoscopy 05/20/18 Hx of Hypertension OSA/CPAP Restless leg syndrome Hyperlipidemia Type II diabetes Skin cancer (basal and squamous)  PLan:  Pt being admitted by Medicine, GI evaluating for  liver disease, MRCP ordered, and we will follow with you.  Rechecking labs in AM.  Lipase 112, will add to current AM labs  Elery Cadenhead 05/22/2018, 2:56 PM     

## 2018-05-22 NOTE — H&P (View-Only) (Signed)
Reason for Consult:  Jaundice Referring Physician: M pHeiffer  LATEEF JUNCAJ is an 62 y.o. male.    CC: emesis/nausea with dark urine and yellow skin color after colonoscopy   HPI: Patient is a 62 year old underwent colonoscopy 2 days ago with polyp removal.  That evening he developed nausea and vomited x1.  He went to work but still felt bad the next day.  He started vomiting again in the evening and complained of generalized abdominal discomfort.  He ultimately came to the ED because of jaundice appearance which was noted by his wife.   Work-up shows he is afebrile and vital signs are stable.  Blood pressures up slightly.  Total bilirubin is 5.6 ALT 501, AST 241, lipase 112, alk phos 132.  Glucose 273, sodium 134.  Creatinine 1.05.  WBC 5.7 hemoglobin 13.8, 42.8 hematocrit platelets 139,000.  Urinalysis unremarkable.  Abdominal ultrasound shows gallstones measuring up to 19 mm.  There is a gallstone in the neck of the gallbladder mild gallbladder wall thickening 2 to 3 mm.  Common bile duct was 4 mm.  GI has been called to reevaluate the patient for biliary disease versus hepatic disease.  We are asked to see to evaluate for possible cholecystitis/cholelithiasis.  Past Medical History:  Diagnosis Date  . Cancer ([facility])    skin cancer  . Diabetes mellitus without complication ([facility])   . GERD (gastroesophageal reflux disease)   . Heart murmur   . Hyperlipidemia   . Hypertension   . OSA on CPAP 08/03/2013   pt does not use CPAP  . Unspecified vitamin D deficiency     Past Surgical History:  Procedure Laterality Date  . LASER ABLATION     veins  . METATARSAL OSTEOTOMY WITH BUNIONECTOMY    . orthopedic surgeries     knee,foot  . VASECTOMY      Family History  Problem Relation Age of Onset  . Cancer Mother        melanoma with mets   . Heart disease Father   . Hyperlipidemia Father   . Diabetes Sister   . Hypertension Brother   . Stomach cancer Maternal Grandfather   . Colon  cancer Neg Hx   . Colon polyps Neg Hx   . Esophageal cancer Neg Hx   . Rectal cancer Neg Hx     Social History:  reports that he quit smoking about 24 years ago. He has never used smokeless tobacco. He reports that he does not drink alcohol or use drugs. Retired Loss adjuster, chartered now.  Allergies:  Allergies  Allergen Reactions  . Caduet [Amlodipine-Atorvastatin] Hives and Itching  . Statins     BLURRED VISION   Prior to Admission medications   Medication Sig Start Date End Date Taking? Authorizing Provider  acetaminophen (TYLENOL) 500 MG tablet Take 1,000 mg by mouth 2 (two) times daily.   Yes [provider]  aspirin 81 MG tablet Take 81 mg by mouth daily.   Yes [provider]  citalopram (CELEXA) 40 MG tablet TAKE 1 TABLET BY MOUTH DAILY. Patient taking differently: Take 40 mg by mouth daily.  04/28/18  Yes Liane Comber, NP  ezetimibe (ZETIA) 10 MG tablet TAKE 1 TABLET (10 MG TOTAL) BY MOUTH DAILY. 04/18/18 04/18/19 Yes Vicie Mutters, PA-C  gabapentin (NEURONTIN) 300 MG capsule TAKE 1 TO 3 CAPSULES BY MOUTH AT NIGHT FOR RESTLESS LEG Patient taking differently: Take 600 mg by mouth at bedtime.  02/24/18  Yes Vicie Mutters, PA-C  losartan (  COZAAR) 50 MG tablet TAKE 1 TABLET (50 MG TOTAL) BY MOUTH DAILY. 04/11/18 04/11/19 Yes Unk Pinto, MD  Magnesium 100 MG CAPS Take 100 mg by mouth daily.    Yes [provider]  Multiple Vitamins-Minerals (ZINC PO) Take 1 tablet by mouth daily.    Yes [provider]  omeprazole (PRILOSEC) 20 MG capsule TAKE 1 CAPSULE BY MOUTH ONCE DAILY Patient taking differently: Take 20 mg by mouth daily.  01/23/18  Yes Unk Pinto, MD  rosuvastatin (CRESTOR) 5 MG tablet Take 1 tablet (5 mg total) by mouth at bedtime. 04/17/18 04/17/19 Yes Collier, Amanda, PA-C  SYNJARDY XR 25-1000 MG TB24 TAKE 1 TABLET BY MOUTH ONCE DAILY Patient taking differently: Take 1 tablet by mouth daily.  04/28/18  Yes Liane Comber, NP  TURMERIC PO Take 1 tablet by mouth daily.    Yes [provider]  blood glucose meter kit and supplies Test sugars once dailyDispense based insurance preference. E11.9 04/17/18   Vicie Mutters, PA-C      Continuous: . sodium chloride 1,000 mL (05/22/18 1143)   Anti-infectives (From admission, onward)   None      Results for orders placed or performed during the hospital encounter of 05/22/18 (from the past 48 hour(s))  Comprehensive metabolic panel     Status: Abnormal   Collection Time: 05/22/18 11:34 AM  Result Value Ref Range   Sodium 134 (L) 135 - 145 mmol/L   Potassium 3.5 3.5 - 5.1 mmol/L   Chloride 101 98 - 111 mmol/L   CO2 23 22 - 32 mmol/L   Glucose, Bld 273 (H) 70 - 99 mg/dL   BUN 12 8 - 23 mg/dL   Creatinine, Ser 1.05 0.61 - 1.24 mg/dL   Calcium 9.2 8.9 - 10.3 mg/dL   Total Protein 7.0 6.5 - 8.1 g/dL   Albumin 3.6 3.5 - 5.0 g/dL   AST 241 (H) 15 - 41 U/L   ALT 501 (H) 0 - 44 U/L   Alkaline Phosphatase 132 (H) 38 - 126 U/L   Total Bilirubin 5.6 (H) 0.3 - 1.2 mg/dL   GFR calc non Af Amer >60 >60 mL/min   GFR calc Af Amer >60 >60 mL/min    Comment: (NOTE) The eGFR has been calculated using the CKD EPI equation. This calculation has not been validated in all clinical situations. eGFR's persistently <60 mL/min signify possible Chronic Kidney Disease.    Anion gap 10 5 - 15    Comment: Performed at [facility] [address] [place], [place] [address]  Lipase, blood     Status: Abnormal   Collection Time: 05/22/18 11:34 AM  Result Value Ref Range   Lipase 112 (H) 11 - 51 U/L    Comment: Performed at [facility], [place] [address], [place], [place] [address]  CBC with Differential     Status: Abnormal   Collection Time: 05/22/18 11:34 AM  Result Value Ref Range   WBC 5.7 4.0 - 10.5 K/uL   RBC 4.79 4.22 - 5.81 MIL/uL   Hemoglobin 13.8 13.0 - 17.0 g/dL   HCT 42.8 39.0 - 52.0 %   MCV 89.4 80.0 - 100.0 fL   MCH 28.8 26.0 -  34.0 pg   MCHC 32.2 30.0 - 36.0 g/dL   RDW 12.9 11.5 - 15.5 %   Platelets 139 (L) 150 - 400 K/uL   nRBC 0.0 0.0 - 0.2 %   Neutrophils Relative % 71 %  Neutro Abs 4.0 1.7 - 7.7 K/uL   Lymphocytes Relative 20 %   Lymphs Abs 1.1 0.7 - 4.0 K/uL   Monocytes Relative 7 %   Monocytes Absolute 0.4 0.1 - 1.0 K/uL   Eosinophils Relative 2 %   Eosinophils Absolute 0.1 0.0 - 0.5 K/uL   Basophils Relative 0 %   Basophils Absolute 0.0 0.0 - 0.1 K/uL   Immature Granulocytes 0 %   Abs Immature Granulocytes 0.01 0.00 - 0.07 K/uL    Comment: Performed at [facility] [address] [place], [place] [address]  Protime-INR     Status: None   Collection Time: 05/22/18 11:34 AM  Result Value Ref Range   Prothrombin Time 14.5 11.4 - 15.2 seconds   INR 1.13     Comment: Performed at [facility] [address] [place], [place] [address]  Urinalysis, Routine w reflex microscopic     Status: Abnormal   Collection Time: 05/22/18 11:34 AM  Result Value Ref Range   Color, Urine AMBER (A) YELLOW    Comment: BIOCHEMICALS MAY BE AFFECTED BY COLOR   APPearance CLEAR CLEAR   Specific Gravity, Urine 1.031 (H) 1.005 - 1.030   pH 6.0 5.0 - 8.0   Glucose, UA >=500 (A) NEGATIVE mg/dL   Hgb urine dipstick NEGATIVE NEGATIVE   Bilirubin Urine NEGATIVE NEGATIVE   Ketones, ur 5 (A) NEGATIVE mg/dL   Protein, ur NEGATIVE NEGATIVE mg/dL   Nitrite NEGATIVE NEGATIVE   Leukocytes, UA NEGATIVE NEGATIVE   RBC / HPF 0-5 0 - 5 RBC/hpf   WBC, UA 0-5 0 - 5 WBC/hpf   Bacteria, UA NONE SEEN NONE SEEN   Squamous Epithelial / LPF 0-5 0 - 5    Comment: Performed at [facility], [place] [address] [place], [place] [address]  Bilirubin, direct     Status: Abnormal   Collection Time: 05/22/18 11:34 AM  Result Value Ref Range   Bilirubin, Direct 3.3 (H) 0.0 - 0.2 mg/dL    Comment: Performed at [facility] [address] [place], [place] [address]  I-Stat CG4 Lactic Acid, ED     Status: None    Collection Time: 05/22/18 11:54 AM  Result Value Ref Range   Lactic Acid, Venous 1.90 0.5 - 1.9 mmol/L    US Abdomen Limited  Result Date: 05/22/2018 CLINICAL DATA:  62 year old male with jaundice.  Recent colonoscopy. EXAM: ULTRASOUND ABDOMEN LIMITED RIGHT UPPER QUADRANT COMPARISON:  None. FINDINGS: Gallbladder: Shadowing gallstones individually up to 19 millimeters diameter. There is a gallstone in the neck of the gallbladder on image 16. There is borderline to mild gallbladder wall thickening of 2-3 millimeters. However, no sonographic Murphy sign was elicited. Common bile duct: Diameter: 4 millimeters, normal. Liver: Echogenic liver (image 29). No discrete liver lesion. No intrahepatic biliary ductal dilatation. Portal vein is patent on color Doppler imaging with normal direction of blood flow towards the liver. Other findings: Negative visible right kidney. IMPRESSION: 1. Appearance suspicious for Acute Cholecystitis: - cholelithiasis, including a stone in the neck of the gallbladder. - mild gallbladder wall thickening, - but no sonographic Murphy sign elicited. 2. No evidence of bile duct obstruction. 3. Fatty liver disease. Electronically Signed   By: Genevie Ann M.D.   On: 05/22/2018 13:19    Review of Systems  Constitutional: Negative.   HENT: Negative.   Eyes: Negative.   Respiratory: Negative.   Cardiovascular: Positive for leg swelling (up all day on  feet they will swell).  Gastrointestinal: Positive for nausea and vomiting. Negative for abdominal pain, blood in stool, constipation, diarrhea and melena.       Only one BM since colonoscopy Tuesday 05/20/18.  Genitourinary: Negative.   Musculoskeletal: Negative.   Skin:       Wife noted Jaundice  Neurological: Negative.   Endo/Heme/Allergies: Negative.   Psychiatric/Behavioral: Negative.    Blood pressure (!) 160/83, pulse (!) 49, temperature 98.5 F (36.9 C), temperature source Oral, resp. rate 13, SpO2 98 %. Physical Exam   Constitutional: He is oriented to person, place, and time. He appears well-developed and well-nourished. No distress.  HENT:  Head: Normocephalic.  Mouth/Throat: Oropharynx is clear and moist. No oropharyngeal exudate.  Eyes: Right eye exhibits no discharge. Left eye exhibits no discharge. Scleral icterus (very mild, but his wife noticed it) is present.  Pupils are equal  Neck: Normal range of motion. Neck supple. No JVD present. No tracheal deviation present. No thyromegaly present.  Cardiovascular: Normal rate, regular rhythm, normal heart sounds and intact distal pulses.  No murmur heard. Respiratory: Effort normal and breath sounds normal. No respiratory distress. He has no wheezes. He has no rales. He exhibits no tenderness.  GI: Soft. Bowel sounds are normal. He exhibits no distension and no mass. There is no tenderness. There is no rebound and no guarding.  Musculoskeletal: He exhibits no edema or tenderness.  Lymphadenopathy:    He has no cervical adenopathy.  Neurological: He is alert and oriented to person, place, and time. A cranial nerve deficit is present.  Skin: Skin is warm and dry. No rash noted. He is not diaphoretic. No erythema. No pallor.  Psychiatric: He has a normal mood and affect. His behavior is normal. Judgment and thought content normal.    Assessment/Plan: Nausea, vomiting, Jaundice with elevated LFT's Cholelithiasis  Colonoscopy 05/20/18 Hx of Hypertension OSA/CPAP Restless leg syndrome Hyperlipidemia Type II diabetes Skin cancer (basal and squamous)  PLan:  Pt being admitted by Medicine, GI evaluating for  liver disease, MRCP ordered, and we will follow with you.  Rechecking labs in AM.  Lipase 112, will add to current AM labs  Aarian Cleaver 05/22/2018, 2:56 PM

## 2018-05-22 NOTE — Telephone Encounter (Signed)
The pt had a colonoscopy on Tuesday and Tuesday night developed nausea and vomiting.  He took zofran and Wed felt better and was able to work in the yard.  This morning the pt woke to find his face and eyes have a yellow tint and dark urine.  Some chills but no abd pain or noted fever.  No rectal bleeding.  Please advise.

## 2018-05-23 ENCOUNTER — Encounter (HOSPITAL_COMMUNITY)
Admission: EM | Disposition: A | Discharge status: Home / Self Care | Payer: Self-pay | Source: Home / Self Care | Attending: Internal Medicine

## 2018-05-23 ENCOUNTER — Inpatient Hospital Stay (HOSPITAL_COMMUNITY): Payer: 59

## 2018-05-23 ENCOUNTER — Encounter (HOSPITAL_COMMUNITY): Payer: Self-pay | Admitting: Physician Assistant

## 2018-05-23 ENCOUNTER — Inpatient Hospital Stay (HOSPITAL_COMMUNITY): Payer: 59 | Admitting: Certified Registered Nurse Anesthetist

## 2018-05-23 ENCOUNTER — Other Ambulatory Visit: Payer: Self-pay

## 2018-05-23 HISTORY — PX: CHOLECYSTECTOMY: SHX55

## 2018-05-23 LAB — CBC
HCT: 36 % — ABNORMAL LOW (ref 39.0–52.0)
Hemoglobin: 12 g/dL — ABNORMAL LOW (ref 13.0–17.0)
MCH: 29.1 pg (ref 26.0–34.0)
MCHC: 33.3 g/dL (ref 30.0–36.0)
MCV: 87.2 fL (ref 80.0–100.0)
NRBC: 0 % (ref 0.0–0.2)
PLATELETS: 138 10*3/uL — AB (ref 150–400)
RBC: 4.13 MIL/uL — ABNORMAL LOW (ref 4.22–5.81)
RDW: 13 % (ref 11.5–15.5)
WBC: 4.3 10*3/uL (ref 4.0–10.5)

## 2018-05-23 LAB — COMPREHENSIVE METABOLIC PANEL
ALK PHOS: 114 U/L (ref 38–126)
ALT: 316 U/L — AB (ref 0–44)
AST: 122 U/L — ABNORMAL HIGH (ref 15–41)
Albumin: 3.1 g/dL — ABNORMAL LOW (ref 3.5–5.0)
Anion gap: 8 (ref 5–15)
BILIRUBIN TOTAL: 3.4 mg/dL — AB (ref 0.3–1.2)
BUN: 8 mg/dL (ref 8–23)
CALCIUM: 8.6 mg/dL — AB (ref 8.9–10.3)
CO2: 23 mmol/L (ref 22–32)
CREATININE: 0.96 mg/dL (ref 0.61–1.24)
Chloride: 107 mmol/L (ref 98–111)
Glucose, Bld: 84 mg/dL (ref 70–99)
Potassium: 3.7 mmol/L (ref 3.5–5.1)
Sodium: 138 mmol/L (ref 135–145)
Total Protein: 5.9 g/dL — ABNORMAL LOW (ref 6.5–8.1)

## 2018-05-23 LAB — GLUCOSE, CAPILLARY
Glucose-Capillary: 72 mg/dL (ref 70–99)
Glucose-Capillary: 79 mg/dL (ref 70–99)
Glucose-Capillary: 84 mg/dL (ref 70–99)
Glucose-Capillary: 174 mg/dL — ABNORMAL HIGH (ref 70–99)
Glucose-Capillary: 260 mg/dL — ABNORMAL HIGH (ref 70–99)

## 2018-05-23 LAB — SURGICAL PCR SCREEN
MRSA, PCR: NEGATIVE
STAPHYLOCOCCUS AUREUS: POSITIVE — AB

## 2018-05-23 LAB — HM DIABETES EYE EXAM

## 2018-05-23 LAB — LIPASE, BLOOD: Lipase: 46 U/L (ref 11–51)

## 2018-05-23 LAB — HEPATITIS PANEL, ACUTE
HCV Ab: 0.2 s/co ratio (ref 0.0–0.9)
HEP B C IGM: NEGATIVE
Hep A IgM: NEGATIVE
Hepatitis B Surface Ag: NEGATIVE

## 2018-05-23 LAB — HIV ANTIBODY (ROUTINE TESTING W REFLEX): HIV Screen 4th Generation wRfx: NONREACTIVE

## 2018-05-23 SURGERY — LAPAROSCOPIC CHOLECYSTECTOMY
Anesthesia: General

## 2018-05-23 SURGERY — ENDOSCOPIC RETROGRADE CHOLANGIOPANCREATOGRAPHY (ERCP) WITH PROPOFOL
Anesthesia: General

## 2018-05-23 MED ORDER — EPHEDRINE SULFATE-NACL 50-0.9 MG/10ML-% IV SOSY
PREFILLED_SYRINGE | INTRAVENOUS | Status: DC | PRN
  Administered 2018-05-23: 10 mg via INTRAVENOUS

## 2018-05-23 MED ORDER — FENTANYL CITRATE (PF) 100 MCG/2ML IJ SOLN
INTRAMUSCULAR | Status: DC | PRN
  Administered 2018-05-23: 100 ug via INTRAVENOUS
  Administered 2018-05-23: 50 ug via INTRAVENOUS
  Administered 2018-05-23: 25 ug via INTRAVENOUS

## 2018-05-23 MED ORDER — TRAMADOL HCL 50 MG PO TABS: 50.0000 mg | ORAL_TABLET | Freq: Four times a day (QID) | ORAL | Status: DC | PRN

## 2018-05-23 MED ORDER — DEXAMETHASONE SODIUM PHOSPHATE 10 MG/ML IJ SOLN
INTRAMUSCULAR | Status: DC | PRN
  Administered 2018-05-23: 10 mg via INTRAVENOUS

## 2018-05-23 MED ORDER — IOPAMIDOL (ISOVUE-300) INJECTION 61%
INTRAVENOUS | Status: AC
  Filled 2018-05-23: qty 50

## 2018-05-23 MED ORDER — DEXAMETHASONE SODIUM PHOSPHATE 10 MG/ML IJ SOLN
INTRAMUSCULAR | Status: AC
  Filled 2018-05-23: qty 1

## 2018-05-23 MED ORDER — SODIUM CHLORIDE 0.9 % IR SOLN
Status: DC | PRN
  Administered 2018-05-23: 1000 mL

## 2018-05-23 MED ORDER — CHLORHEXIDINE GLUCONATE CLOTH 2 % EX PADS: 6.0000 | MEDICATED_PAD | Freq: Every day | CUTANEOUS | Status: DC

## 2018-05-23 MED ORDER — SUGAMMADEX SODIUM 200 MG/2ML IV SOLN
INTRAVENOUS | Status: AC
  Filled 2018-05-23: qty 2

## 2018-05-23 MED ORDER — LIDOCAINE 2% (20 MG/ML) 5 ML SYRINGE
INTRAMUSCULAR | Status: AC
  Filled 2018-05-23: qty 5

## 2018-05-23 MED ORDER — HYDROMORPHONE HCL 1 MG/ML IJ SOLN: 0.5000 mg | INTRAMUSCULAR | Status: DC | PRN

## 2018-05-23 MED ORDER — PROPOFOL 10 MG/ML IV BOLUS
INTRAVENOUS | Status: AC
  Filled 2018-05-23: qty 20

## 2018-05-23 MED ORDER — MIDAZOLAM HCL 2 MG/2ML IJ SOLN
INTRAMUSCULAR | Status: DC | PRN
  Administered 2018-05-23 (×2): 1 mg via INTRAVENOUS

## 2018-05-23 MED ORDER — ONDANSETRON HCL 4 MG/2ML IJ SOLN
INTRAMUSCULAR | Status: AC
  Filled 2018-05-23: qty 2

## 2018-05-23 MED ORDER — MIDAZOLAM HCL 2 MG/2ML IJ SOLN
INTRAMUSCULAR | Status: AC
  Filled 2018-05-23: qty 2

## 2018-05-23 MED ORDER — GADOBUTROL 1 MMOL/ML IV SOLN
6.0000 mL | Freq: Once | INTRAVENOUS | Status: AC | PRN
  Administered 2018-05-23: 6 mL via INTRAVENOUS

## 2018-05-23 MED ORDER — SUGAMMADEX SODIUM 200 MG/2ML IV SOLN
INTRAVENOUS | Status: DC | PRN
  Administered 2018-05-23: 200 mg via INTRAVENOUS

## 2018-05-23 MED ORDER — BUPIVACAINE-EPINEPHRINE 0.25% -1:200000 IJ SOLN
INTRAMUSCULAR | Status: DC | PRN
  Administered 2018-05-23: 8 mL

## 2018-05-23 MED ORDER — LACTATED RINGERS IV SOLN
INTRAVENOUS | Status: DC
  Administered 2018-05-23 (×2): via INTRAVENOUS

## 2018-05-23 MED ORDER — LIDOCAINE 2% (20 MG/ML) 5 ML SYRINGE
INTRAMUSCULAR | Status: DC | PRN
  Administered 2018-05-23: 80 mg via INTRAVENOUS

## 2018-05-23 MED ORDER — MUPIROCIN 2 % EX OINT
1.0000 "application " | TOPICAL_OINTMENT | Freq: Two times a day (BID) | CUTANEOUS | Status: DC
  Filled 2018-05-23: qty 22

## 2018-05-23 MED ORDER — DOCUSATE SODIUM 100 MG PO CAPS
100.0000 mg | ORAL_CAPSULE | Freq: Two times a day (BID) | ORAL | Status: DC
  Filled 2018-05-23: qty 1

## 2018-05-23 MED ORDER — 0.9 % SODIUM CHLORIDE (POUR BTL) OPTIME
TOPICAL | Status: DC | PRN
  Administered 2018-05-23: 1000 mL

## 2018-05-23 MED ORDER — ROCURONIUM BROMIDE 10 MG/ML (PF) SYRINGE
PREFILLED_SYRINGE | INTRAVENOUS | Status: DC | PRN
  Administered 2018-05-23: 50 mg via INTRAVENOUS
  Administered 2018-05-23: 20 mg via INTRAVENOUS

## 2018-05-23 MED ORDER — ONDANSETRON HCL 4 MG/2ML IJ SOLN
INTRAMUSCULAR | Status: DC | PRN
  Administered 2018-05-23: 4 mg via INTRAVENOUS

## 2018-05-23 MED ORDER — ROCURONIUM BROMIDE 50 MG/5ML IV SOSY
PREFILLED_SYRINGE | INTRAVENOUS | Status: AC
  Filled 2018-05-23: qty 5

## 2018-05-23 MED ORDER — PROPOFOL 10 MG/ML IV BOLUS
INTRAVENOUS | Status: DC | PRN
  Administered 2018-05-23: 170 mg via INTRAVENOUS

## 2018-05-23 MED ORDER — FENTANYL CITRATE (PF) 250 MCG/5ML IJ SOLN
INTRAMUSCULAR | Status: AC
  Filled 2018-05-23: qty 5

## 2018-05-23 MED ORDER — SODIUM CHLORIDE 0.9 % IV SOLN
2.0000 g | INTRAVENOUS | Status: AC
  Administered 2018-05-23: 2 g via INTRAVENOUS
  Filled 2018-05-23: qty 20

## 2018-05-23 SURGICAL SUPPLY — 40 items
ADH SKN CLS APL DERMABOND .7 (GAUZE/BANDAGES/DRESSINGS) ×1
APPLIER CLIP 5 13 M/L LIGAMAX5 (MISCELLANEOUS) ×2
APR CLP MED LRG 5 ANG JAW (MISCELLANEOUS) ×1
BAG SPEC RTRVL LRG 6X4 10 (ENDOMECHANICALS) ×1
BLADE CLIPPER SURG (BLADE) IMPLANT
CANISTER SUCT 3000ML PPV (MISCELLANEOUS) ×2 IMPLANT
CHLORAPREP W/TINT 26ML (MISCELLANEOUS) ×2 IMPLANT
CLIP APPLIE 5 13 M/L LIGAMAX5 (MISCELLANEOUS) ×1 IMPLANT
COVER SURGICAL LIGHT HANDLE (MISCELLANEOUS) ×2 IMPLANT
COVER WAND RF STERILE (DRAPES) ×2 IMPLANT
DERMABOND ADVANCED (GAUZE/BANDAGES/DRESSINGS) ×1
DERMABOND ADVANCED .7 DNX12 (GAUZE/BANDAGES/DRESSINGS) ×1 IMPLANT
ELECT REM PT RETURN 9FT ADLT (ELECTROSURGICAL) ×2
ELECTRODE REM PT RTRN 9FT ADLT (ELECTROSURGICAL) ×1 IMPLANT
ENDOLOOP SUT PDS II  0 18 (SUTURE) ×2
ENDOLOOP SUT PDS II 0 18 (SUTURE) IMPLANT
GLOVE BIO SURGEON STRL SZ 6 (GLOVE) ×2 IMPLANT
GLOVE INDICATOR 6.5 STRL GRN (GLOVE) ×2 IMPLANT
GOWN STRL REUS W/ TWL LRG LVL3 (GOWN DISPOSABLE) ×3 IMPLANT
GOWN STRL REUS W/TWL LRG LVL3 (GOWN DISPOSABLE) ×6
GRASPER SUT TROCAR 14GX15 (MISCELLANEOUS) ×2 IMPLANT
KIT BASIN OR (CUSTOM PROCEDURE TRAY) ×2 IMPLANT
KIT TURNOVER KIT B (KITS) ×2 IMPLANT
NDL INSUFFLATION 14GA 120MM (NEEDLE) ×1 IMPLANT
NEEDLE INSUFFLATION 14GA 120MM (NEEDLE) ×2 IMPLANT
NS IRRIG 1000ML POUR BTL (IV SOLUTION) ×2 IMPLANT
PAD ARMBOARD 7.5X6 YLW CONV (MISCELLANEOUS) ×2 IMPLANT
POUCH SPECIMEN RETRIEVAL 10MM (ENDOMECHANICALS) ×2 IMPLANT
SCISSORS LAP 5X35 DISP (ENDOMECHANICALS) ×2 IMPLANT
SET IRRIG TUBING LAPAROSCOPIC (IRRIGATION / IRRIGATOR) ×2 IMPLANT
SLEEVE ENDOPATH XCEL 5M (ENDOMECHANICALS) ×4 IMPLANT
SPECIMEN JAR SMALL (MISCELLANEOUS) ×2 IMPLANT
SUT MNCRL AB 4-0 PS2 18 (SUTURE) ×2 IMPLANT
TOWEL OR 17X24 6PK STRL BLUE (TOWEL DISPOSABLE) ×1 IMPLANT
TOWEL OR 17X26 10 PK STRL BLUE (TOWEL DISPOSABLE) ×1 IMPLANT
TRAY LAPAROSCOPIC MC (CUSTOM PROCEDURE TRAY) ×2 IMPLANT
TROCAR XCEL NON-BLD 11X100MML (ENDOMECHANICALS) ×2 IMPLANT
TROCAR XCEL NON-BLD 5MMX100MML (ENDOMECHANICALS) ×2 IMPLANT
TUBING INSUFFLATION (TUBING) ×2 IMPLANT
WATER STERILE IRR 1000ML POUR (IV SOLUTION) ×1 IMPLANT

## 2018-05-23 NOTE — Progress Notes (Signed)
Notified Dr. Kieth Brightly of bleeding around umbilicus site told to continue to monitor.

## 2018-05-23 NOTE — Interval H&P Note (Signed)
History and Physical Interval Note:  05/23/2018 11:24 AM  Glenn Stephens  has presented today for surgery, with the diagnosis of Choledocholithiasis  The various methods of treatment have been discussed with the patient and family. After consideration of risks, benefits and other options for treatment, the patient has consented to  Procedure(s): LAPAROSCOPIC CHOLECYSTECTOMY (N/A) as a surgical intervention .  The patient's history has been reviewed, patient examined, no change in status, stable for surgery.  I have reviewed the patient's chart and labs.  Questions were answered to the patient's satisfaction.     Ulanda Tackett Rich Brave

## 2018-05-23 NOTE — Progress Notes (Signed)
Subjective: Patient was seen and evaluated at bedside on morning rounds. No acute events overnight. MRCP has been done this AM. He denies any abdominal pain. No nausea or vomiting. No other complaint.  I update patient and his husband about the lab results so far and the general plan. They have a good verbal understanding and agree with plan.  Objective:  Vital signs in last 24 hours: Vitals:   05/22/18 1430 05/22/18 1445 05/22/18 1728 05/22/18 2149  BP:   (!) 154/89 (!) 169/76  Pulse: (!) 49 (!) 49 (!) 53 (!) 51  Resp: 15 13 17 18   Temp:   98.5 F (36.9 C) 98.7 F (37.1 C)  TempSrc:   Oral Oral  SpO2: 100% 98% 100% 99%   Physical exam: General: Pleasant gentleman, lying on the bed in no acute distress HEENT: Eyes: Sclerae is icterus CV: Has II to III/VI systolic murmur, rate is regular in rate is is bradycardic Lungs: CTA bilaterally, no rale, no wheeze Abdomen: Soft and nontender to palpation, no hepatomegaly.  BS are present Extremities: Pulses are palpable bilaterally, no lower extremity edema Neurology: Patient is alert and oriented x3 no gross neurological deficit Psychiatric: Normal mood and affect, judgment is normal  Assessment/Plan:  Principal Problem:   Jaundice Active Problems:   Cholelithiases   Elevated LFTs  Mr. 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  New onset jaundice, asymptomatic direct hyperbilirubinemia Moderately elevated LFT, lipase and mild elevation of alkaline phosphatase Patient still has some scleral icterus otherwise asymptomatic. MRCP-->Gallstone at head of gallbladder. With no intra or extra hepatic bile duct dilation. No pancreatic bile dilation. No biliary obstruction   1. Ovoid lesion  in the head of the pancreas which appears to contain fat which typically indicates a benign process. Differential include Lipoma versus an atypical periampullary duodenum diverticulum. Other pancreatic neoplastic process cannot be completely excluded. No pancreatic duct dilatation.   2. No evidence of biliary obstruction. Mild focal thickening along the posterior wall of the common bile duct through the head of the pancreas. No common bile duct or intrahepatic dilatation. 3. Two large gallstones in the gallbladder one towards the neck.  Total Bili trended down to 3.4 AST, ALT trended down but still miild to moderately high (x4 and x8) NL Lipase today (112-->46) CBC with no leukocytosis No Tylenol toxicity HBs Ag neg HCV Ab normal  Gallstone with probable  intrahepatic/extrahepatic obstruction lab Coley performed today. -Pain control and monitoring overnight and may discharge tomorrow -Clear Diet -Dilaudid 0.5 mg IV q 4h for breakthrough pain -Tramadol 50 mg q 6h PRN for moderate to sever pain -f/u ANA, Antismooth Ab, mitochondrial Ab -CMP now and daily -f/u with HIV result  Type 2 DM: On Synjardy XR 25-1000 mg daily at home. Last HbA1c: 8.4 -Moderate SSI -CBG monitoring -C/w home regimen of ASA 81 mg QD -f/u outpatient with PCP to consider   HLD:  -Continue with home regimen of Crestor 5 mg daily and Zetia 10 mg daily  HTN: Mildly hypertensive at SBP 150-169.  We will monitor -Continue home regimen of losartan 50 mg daily  -Monitor VS  Restless leg syndrome: Is on gabapentin 600 mL daily at home. Stable. -Continue home regimen   II/VI systolic murmur at  A and P area: Patient denies any DOE. No echo in chart -Recommend TTE outpatient  Diet: liquid IV fluid: None VTE ppx: Lovenox Code status: Full  Dispo: Anticipated discharge in approximately 1-2 day(s).   Dewayne Hatch, MD 05/23/2018, 5:48 AM Pager: [phone]

## 2018-05-23 NOTE — Discharge Summary (Signed)
Name: Glenn Stephens MRN: [id number] DOB: 1955-10-16 62 y.o. PCP: Unk Pinto, MD  Date of Admission: 05/22/2018 10:57 AM Date of Discharge: 05/24/2018 Attending Physician: Dr. Murriel Hopper  Discharge Diagnosis: 1. Painless jaundice secondary to choledocholithiasis 2. Pancreatic Lesion 3. Type II Diabetes 4. Hyperlipidemia 5. Hypertension 6. Restless leg syndrome 7. Systolic Murmur  Discharge Medications: Allergies as of 05/24/2018      Reactions   Caduet [amlodipine-atorvastatin] Hives, Itching   Statins    BLURRED VISION      Medication List    TAKE these medications   acetaminophen 500 MG tablet Commonly known as:  TYLENOL Take 1,000 mg by mouth 2 (two) times daily.   aspirin 81 MG tablet Take 81 mg by mouth daily.   blood glucose meter kit and supplies Test sugars once dailyDispense based insurance preference. E11.9   citalopram 40 MG tablet Commonly known as:  CELEXA TAKE 1 TABLET BY MOUTH DAILY.   ezetimibe 10 MG tablet Commonly known as:  ZETIA TAKE 1 TABLET (10 MG TOTAL) BY MOUTH DAILY.   gabapentin 300 MG capsule Commonly known as:  NEURONTIN TAKE 1 TO 3 CAPSULES BY MOUTH AT NIGHT FOR RESTLESS LEG What changed:  See the new instructions.   losartan 50 MG tablet Commonly known as:  COZAAR TAKE 1 TABLET (50 MG TOTAL) BY MOUTH DAILY.   Magnesium 100 MG Caps Take 100 mg by mouth daily.   omeprazole 20 MG capsule Commonly known as:  PRILOSEC TAKE 1 CAPSULE BY MOUTH ONCE DAILY   rosuvastatin 5 MG tablet Commonly known as:  CRESTOR Take 1 tablet (5 mg total) by mouth at bedtime.   SYNJARDY XR 25-1000 MG Tb24 Generic drug:  Empagliflozin-metFORMIN HCl ER TAKE 1 TABLET BY MOUTH ONCE DAILY   traMADol 50 MG tablet Commonly known as:  ULTRAM Take 1-2 tablets (50-100 mg total) by mouth every 6 (six) hours as needed for moderate pain or severe pain.   TURMERIC PO Take 1 tablet by mouth daily.   ZINC PO Take 1 tablet by mouth  daily.            Discharge Care Instructions  (From admission, onward)         Start     Ordered   05/24/18 0000  Discharge wound care:    Comments:  It is good for closed incisions and even open wounds to be washed every day.  Shower every day.  Short baths are fine.  Wash the incisions and wounds clean with soap & water.     You may leave closed incisions open to air if it is dry.   You may cover the incision with clean gauze & replace it after your daily shower for comfort.   If you have skin tapes (Steristrips) or skin glue (Dermabond) on your incision, leave them in place.  They will fall off on their own like a scab.  You may trim any edges that curl up with clean scissors.    If you have skin staples, set up an appointment for them to be removed in the office in 10 days after surgery.  If you have a drain, wash around the skin exit site with soap & water and place a new dressing of gauze or band aid around the skin every day.  Keep the drain site clean & dry.   05/24/18 0845          Disposition and follow-up:   [name] was discharged  from [facility] in Stable condition.  At the hospital follow up visit please address:  1.  Please follow LFT's to ensure that they are continuing to decrease.   2. Recommend outpatient ECHO for new systolic murmur.  3. Please follow-up GI recommendations for work-up of pancreatic lesion.  4.  Labs / imaging needed at time of follow-up: CMP  5.  Pending labs/ test needing follow-up:  ANA, Antismooth Ab, mitochondrial Ab, HIV   Follow-up Appointments: Follow-up [facility], [place]. Go on 06/09/2018.   Specialty:  General Surgery Why:  Your appointment is 12/02 at 10 am Please arrive 30 minutes prior to your appointment to check in and fill out paperwork. Bring photo ID and insurance information. Contact information: [address] [place] Cata[place] Hospital Course by problem list: 1. Painless jaundice secondary to choledocholithiasis: [name] presented with painless jaundice and was found to have hyperbilirubinemia, transaminitis, elevated lipase and alkaline phosphatase. Labs were consistent with an obstructive pattern.  MRCP showed 2 large gallstones, nonobstructing, and the gallbladder without biliary obstruction.  An ovoid lesion seen in the head of the pancreas with fat density most consistent with a benign lesion.  Liver chemistries were improving and it is thought that he likely passed a gallstone.  He was seen in consultation by general surgery and he underwent an uncomplicated laparoscopic cholecystectomy on November 15. Liver functions continued to improve post-operatively and he was discharged in stable condition.   2. Pancreatic Lesion: An ovoid lesion seen in the head of the pancreas with fat density most consistent with a benign lesion. He will follow-up with Dr. Rush Landmark with [facility] to evaulate pancreatic mass.   3. Type II Diabetes: He is on Synjardy XR 25-1000 mg daily at home. He was placed on moderate SSI in the hospital.  Blood sugar remained within acceptable limits and his Iva Boop was restarted upon discharge.   4. Hyperlipidemia: His Crestor Crestor 5 mg daily and Zetia 10 mg daily was continued throughout admission and discharge.  5. Hypertension: He was hypertensive throughout admission with SBP 150-170 while on home Losartan 50 mg daily. This medication was continued at discharge. He will need a blood pressure recheck at follow-up to determine need for additional antihypertensive medications.  6. Restless leg syndrome: Home gabapentin 600 mg QD was continued throughout admission and his RLS remained stable without symptoms. This medication was continued at discharge.   7. Systolic Murmur: New systolic murmur auscultated during admission. Recommend ECHO outpatient.      Discharge Vitals:   BP (!) 143/72 (BP Location: Right Arm)   Pulse 61   Temp 98.8 F (37.1 C) (Oral)   Resp 18   Ht 6' 0.01" (1.829 m)   Wt 109.3 kg   SpO2 96%   BMI 32.68 kg/m   Pertinent Labs, Studies, and Procedures:  CMP Latest Ref Rng & Units 05/24/2018 05/23/2018 05/22/2018  Glucose 70 - 99 mg/dL 172(H) 84 273(H)  BUN 8 - 23 mg/dL 14 8 12   Creatinine 0.61 - 1.24 mg/dL 1.03 0.96 1.05  Sodium 135 - 145 mmol/L 137 138 134(L)  Potassium 3.5 - 5.1 mmol/L 4.0 3.7 3.5  Chloride 98 - 111 mmol/L 103 107 101  CO2 22 - 32 mmol/L 26 23 23   Calcium 8.9 - 10.3 mg/dL 8.8(L) 8.6(L) 9.2  Total Protein 6.5 - 8.1 g/dL 6.4(L) 5.9(L) 7.0  Total Bilirubin 0.3 - 1.2 mg/dL 1.7(H) 3.4(H) 5.6(H)  Alkaline Phos 38 - 126 U/L 113 114 132(H)  AST 15 - 41 U/L 63(H) 122(H) 241(H)  ALT 0 - 44 U/L 234(H) 316(H) 501(H)    MRCP IMPRESSION: 1. Ovoid lesion in the head of the pancreas which appears to contain fat which typically indicates a benign process. Differential include lipoma in the head of the pancreas versus an atypical periampullary duodenum diverticulum. Other pancreatic neoplastic process cannot be completely excluded. No pancreatic duct dilatation. Pancreas was not imaged on limited comparison ultrasound. 2. No evidence of biliary obstruction. There is mild focal thickening along the posterior wall of the common bile duct through the head of the pancreas. No common bile duct dilatation. No intrahepatic duct dilatation 3. Two large gallstones in the gallbladder one towards the neck. Mild pericholecystic fluid. No gallbladder distension.   Signed: Carroll Sage, MD 05/23/2018, 3:39 PM   Pager: [phone]

## 2018-05-23 NOTE — Anesthesia Postprocedure Evaluation (Signed)
Anesthesia Post Note  Patient: Glenn Stephens  Procedure(s) Performed: LAPAROSCOPIC CHOLECYSTECTOMY (N/A )     Patient location during evaluation: PACU Anesthesia Type: General Level of consciousness: awake and alert Pain management: pain level controlled Vital Signs Assessment: post-procedure vital signs reviewed and stable Respiratory status: spontaneous breathing, nonlabored ventilation, respiratory function stable and patient connected to nasal cannula oxygen Cardiovascular status: blood pressure returned to baseline and stable Postop Assessment: no apparent nausea or vomiting Anesthetic complications: no    Last Vitals:  Vitals:   05/23/18 1429 05/23/18 1712  BP: (!) 143/72 (!) 173/78  Pulse: 61 (!) 57  Resp: 18 18  Temp: 37.1 C 36.7 C  SpO2: 96% 99%    Last Pain:  Vitals:   05/23/18 1712  TempSrc: Oral  PainSc:                  Lyndel Dancel DAVID

## 2018-05-23 NOTE — Progress Notes (Signed)
    CC:  Subjective: No pain he feels fine this a.m.  He was told he is got [place] scheduled spot with endoscopy this a.m.  MRCP still pending.  Objective: Vital signs in last 24 hours: Temp:  [98.5 F (36.9 C)-98.7 F (37.1 C)] 98.7 F (37.1 C) (11/14 2149) Pulse Rate:  [49-69] 51 (11/14 2149) Resp:  [11-20] 18 (11/14 2149) BP: (126-169)/(63-89) 169/76 (11/14 2149) SpO2:  [96 %-100 %] 99 % (11/14 2149) Last BM Date: 05/21/18 1431 IV Afebrile vital signs are stable LFTs improving, lipase is normal WBC 4.3 H/H 12/36 Platelets 138K - low   Intake/Output from previous day: 11/14 0701 - 11/15 0700 In: 1431.5 [I.V.:1431.5] Out: -  Intake/Output this shift: No intake/output data recorded.  General appearance: alert, cooperative and no distress Resp: clear to auscultation bilaterally GI: Soft nontender, no pain, positive bowel sounds  Lab Results:  Recent Labs    05/22/18 1134 05/23/18 0318  WBC 5.7 4.3  HGB 13.8 12.0*  HCT 42.8 36.0*  PLT 139* 138*    BMET Recent Labs    05/22/18 1134 05/23/18 0318  NA 134* 138  K 3.5 3.7  CL 101 107  CO2 23 23  GLUCOSE 273* 84  BUN 12 8  CREATININE 1.05 0.96  CALCIUM 9.2 8.6*   PT/INR Recent Labs    05/22/18 1134  LABPROT 14.5  INR 1.13    Recent Labs  Lab 05/22/18 1134 05/23/18 0318  AST 241* 122*  ALT 501* 316*  ALKPHOS 132* 114  BILITOT 5.6* 3.4*  PROT 7.0 5.9*  ALBUMIN 3.6 3.1*     Lipase     Component Value Date/Time   LIPASE 46 05/23/2018 0318     Medications: . aspirin EC  81 mg Oral Daily  . citalopram  40 mg Oral Daily  . enoxaparin (LOVENOX) injection  40 mg Subcutaneous Q24H  . ezetimibe  10 mg Oral Daily  . gabapentin  600 mg Oral QHS  . insulin aspart  0-15 Units Subcutaneous TID WC  . insulin aspart  0-5 Units Subcutaneous QHS  . losartan  50 mg Oral Daily  . rosuvastatin  5 mg Oral QHS   . sodium chloride 125 mL/hr at 05/23/18 0307   Anti-infectives (From admission, onward)    None      Assessment/Plan Hx of Hypertension OSA/CPAP Restless leg syndrome Hyperlipidemia Type II diabetes Skin cancer (basal and squamous) Colonoscopy 05/20/18    Nausea, vomiting, Jaundice with elevated LFT's Cholelithiasis - possible choledocholithiasis  - MRCP pending  - LFT's better   FEN: IV fluids/n.p.o. ID: None DVT: Lovenox   Plan: Awaiting final on MRCP.  If this is negative we will plan cholecystectomy later this a.m.  I will preop him now.  LOS: 1 day    Yarixa Lightcap 05/23/2018 [phone]

## 2018-05-23 NOTE — Anesthesia Preprocedure Evaluation (Addendum)
Anesthesia Evaluation  Patient identified by MRN, date of birth, ID band Patient awake    Reviewed: Allergy & Precautions, NPO status , Patient's Chart, lab work & pertinent test results  History of Anesthesia Complications Negative for: history of anesthetic complications  Airway Mallampati: I  TM Distance: >3 FB Neck ROM: Full    Dental  (+) Edentulous Upper, Edentulous Lower, Dental Advisory Given   Pulmonary sleep apnea (no CPAP or O2) , former smoker,    Pulmonary exam normal        Cardiovascular hypertension, Pt. on medications Normal cardiovascular examValvular problems/murmurs: heart murmur.      Neuro/Psych Anxiety  Neuromuscular disease (neuropathy (DM))    GI/Hepatic GERD  Controlled,  Endo/Other  diabetes, Type 2, Oral Hypoglycemic Agents  Renal/GU      Musculoskeletal   Abdominal   Peds  Hematology   Anesthesia Other Findings   Reproductive/Obstetrics                            Anesthesia Physical Anesthesia Plan  ASA: II  Anesthesia Plan: General   Post-op Pain Management:    Induction:   PONV Risk Score and Plan: 2 and Ondansetron, Dexamethasone and Midazolam  Airway Management Planned: Oral ETT  Additional Equipment:   Intra-op Plan:   Post-operative Plan: Extubation in OR  Informed Consent: I have reviewed the patients History and Physical, chart, labs and discussed the procedure including the risks, benefits and alternatives for the proposed anesthesia with the patient or authorized representative who has indicated his/her understanding and acceptance.   Dental advisory given  Plan Discussed with: CRNA and Surgeon  Anesthesia Plan Comments:        Anesthesia Quick Evaluation

## 2018-05-23 NOTE — Anesthesia Procedure Notes (Signed)
Procedure Name: Intubation Date/Time: 05/23/2018 12:10 PM Performed by: Audry Pili, MD Pre-anesthesia Checklist: Patient identified, Emergency Drugs available, Suction available and Patient being monitored Patient Re-evaluated:Patient Re-evaluated prior to induction Oxygen Delivery Method: Circle System Utilized Preoxygenation: Pre-oxygenation with 100% oxygen Induction Type: IV induction Ventilation: Mask ventilation without difficulty Laryngoscope Size: Mac and 4 Grade View: Grade I Tube type: Oral Tube size: 7.5 mm Number of attempts: 1 Airway Equipment and Method: Stylet and Oral airway Placement Confirmation: ETT inserted through vocal cords under direct vision,  positive ETCO2 and breath sounds checked- equal and bilateral Secured at: 23 cm Tube secured with: Tape Dental Injury: Teeth and Oropharynx as per pre-operative assessment

## 2018-05-23 NOTE — Op Note (Signed)
Operative Note  Glenn Stephens 62 y.o. male [id number]  05/23/2018  Surgeon: Clovis Riley MD  Assistant: none  Procedure performed: Laparoscopic Cholecystectomy  Preop diagnosis: recent cholecocholithiasis Post-op diagnosis/intraop findings: same, evidence of acute on chronic cholecystitis  Specimens: gallbladder  EBL: minimal   Complications: none  Description of procedure: After obtaining informed consent the patient was brought to the operating room. Prophylactic antibiotics and subcutaneous heparin were administered. SCD's were applied. General endotracheal anesthesia was initiated and a formal time-out was performed. The abdomen was prepped and draped in the usual sterile fashion and the abdomen was entered using an infraumbilical veress needle after instilling the site with local. Insufflation to 86mmHg was obtained, 72mm trocar and camera inserted and gross inspection revealed no evidence of injury from our entry or other intraabdominal abnormalities. Two 59mm trocars were introduced in the right midclavicular and right anterior axillary lines under direct visualization and following infiltration with local. An 11mm trocar was placed in the epigastrium. The gallbladder was encased in dense omental adhesions which were carefully taken down with cautery and blunt dissection until the infundibulum of the gallbladder could be visualized. There was a preponderance of visceral fat in addition to which I suspect inflammation was tethering structures together and this made dissection of the cystic duct extremely difficult. Dissection was done carefully, taking one translucent layer at a time. The gallbladder was retracted cephalad and the infundibulum was retracted laterally. A combination of hook electrocautery and blunt dissection was ultimately able to clear the peritoneum an fatty infiltration from the neck and cystic duct, circumferentially isolating the cystic artery and cystic duct and  lifting the gallbladder from the cystic plate. The critical view of safety was achieved with the cystic artery, cystic duct, and liver bed visualized between them with no other structures. The cystic duct was quite patulous and would not be occluded with a clip. The artery was clipped with a single clip proximally and distally and divided. The remainder of the gallbladder was dissected from the liver bed using cautery until the gallbladder was connected to the patient only by the cystic duct. This was ligated with 2 PDS Endoloops proximally and then the cystic duct was sharply divided distally. The gallbladder was placed in an Endo Catch bag without spillage of stones or bile. This was removed through the epigastric trocar site. A small amount of bleeding on the liver bed was controlled with cautery. The right upper quadrant was irrigated and aspirated and the effluent was clear. The liver bed was closely inspected. Hemostasis was once again confirmed, and reinspection of the abdomen revealed no injuries. The Endoloops were well opposed without any bile leak from the duct or the liver bed. The 93mm trocar site in the epigastrium was closed with a 0 vicryl in the fascia under direct visualization using a PMI device. The abdomen was desufflated and all trocars removed. The skin incisions were closed with running subcuticular monocryl and Dermabond. The patient was awakened, extubated and transported to the recovery room in stable condition.   All counts were correct at the completion of the case.

## 2018-05-23 NOTE — Progress Notes (Addendum)
Daily Rounding Note  05/23/2018, 8:19 AM  LOS: 1 day   SUBJECTIVE:   Chief complaint: jaundice, anorexia Pt feeling well, he is hungry.    OBJECTIVE:         Vital signs in last 24 hours:    Temp:  [98.5 F (36.9 C)-98.7 F (37.1 C)] 98.7 F (37.1 C) (11/14 2149) Pulse Rate:  [49-69] 51 (11/14 2149) Resp:  [11-20] 18 (11/14 2149) BP: (126-169)/(63-89) 169/76 (11/14 2149) SpO2:  [96 %-100 %] 99 % (11/14 2149) Last BM Date: 05/21/18 There were no vitals filed for this visit. General: looks well   Heart: RRR Chest: clear bil.  No labored breathing.   Abdomen: soft, NT, active BS, ND  Extremities: No CCE Neuro/Psych:  Oriented x 3.  Fully alert, calm.    Intake/Output from previous day: 11/14 0701 - 11/15 0700 In: 1431.5 [I.V.:1431.5] Out: -   Intake/Output this shift: No intake/output data recorded.  Lab Results: Recent Labs    05/22/18 1134 05/23/18 0318  WBC 5.7 4.3  HGB 13.8 12.0*  HCT 42.8 36.0*  PLT 139* 138*   BMET Recent Labs    05/22/18 1134 05/23/18 0318  NA 134* 138  K 3.5 3.7  CL 101 107  CO2 23 23  GLUCOSE 273* 84  BUN 12 8  CREATININE 1.05 0.96  CALCIUM 9.2 8.6*   LFT Recent Labs    05/22/18 1134 05/23/18 0318  PROT 7.0 5.9*  ALBUMIN 3.6 3.1*  AST 241* 122*  ALT 501* 316*  ALKPHOS 132* 114  BILITOT 5.6* 3.4*  BILIDIR 3.3*  --    PT/INR Recent Labs    05/22/18 1134  LABPROT 14.5  INR 1.13   Hepatitis Panel Recent Labs    05/22/18 1134  HEPBSAG Negative  HCVAB 0.2  HEPAIGM Negative  HEPBIGM Negative    Studies/Results: Mr 3d Recon At Scanner Mr Abdomen Mrcp Moise Boring Contast  Result Date: 05/23/2018 CLINICAL DATA:  62 year old male with past medical history of DM type II on Synjardy, OSA, HTN, hyperlipidemia, PTSD and GAD, 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 Heavily T2-weighted images of the biliary and pancreatic ducts were obtained, and three-dimensional MRCP images were rendered by post processing. CONTRAST:  6 mL Gadavist COMPARISON:  Ultrasound 05/22/2018 FINDINGS: Lower chest:  Lung bases are clear. Hepatobiliary: No intrahepatic biliary duct dilatation. No extrahepatic biliary duct dilatation. The common bile duct measures 5-6 mm which is within normal limits. No filling defect within the common bile duct. There is may be subtle focal thickening along the posterior wall of the common bile duct (image 23/3). This is not seen on all sequences. This is adjacent to the a pancreatic lesion described below. There is a large gallstone in the neck of the gallbladder measuring 16 mm. Large gallstone in the fundus of the gallbladder measuring 19 mm. There is mild pericholecystic fluid (image 21/3). No gallbladder distension of the gallbladder measuring 2.3 cm in diameter. Pancreas: Within head of the pancreas there is a rounded lesion measuring 2.7 by 2.2 cm which is hypointense on noncontrast T1 weighted imaging compared to the normal pancreatic parenchyma (image 66/18). This  lesion has mixed signal intensity on T2 weighted imaging (image 25/3 and image 27/7). Lesion demonstrates minimal post-contrast enhancement (series 24). Apparent loss of signal intensity on the fat-suppressed imaging suggest fat containing lesion. There is no dilatation of the pancreatic duct no pancreatic inflammation Spleen: Normal spleen.  Splenule at splenic hilum. Adrenals/urinary tract: Adrenal glands and kidneys are normal. Stomach/Bowel: Stomach and limited of the small bowel is unremarkable Vascular/Lymphatic: Abdominal aortic normal caliber. No  retroperitoneal periportal lymphadenopathy. Musculoskeletal: No aggressive osseous lesion IMPRESSION: 1. Ovoid lesion in the head of the pancreas which appears to contain fat which typically indicates a benign process. Differential include lipoma in the head of the pancreas versus an atypical periampullary duodenum diverticulum. Other pancreatic neoplastic process cannot be completely excluded. No pancreatic duct dilatation. Pancreas was not imaged on limited comparison ultrasound. 2. No evidence of biliary obstruction. There is. No common bile duct dilatation. No intrahepatic duct dilatation 3. Two large gallstones in the gallbladder one towards the neck. Mild pericholecystic fluid. No gallbladder distension. These results will be called to the ordering clinician or representative by the Radiologist Assistant, and communication documented in the PACS or zVision Dashboard. Electronically Signed   By: Suzy Bouchard M.D.   On: 05/23/2018 08:01   US Abdomen Limited  Result Date: 05/22/2018 CLINICAL DATA:  62 year old male with jaundice.  Recent colonoscopy. EXAM: ULTRASOUND ABDOMEN LIMITED RIGHT UPPER QUADRANT COMPARISON:  None. FINDINGS: Gallbladder: Shadowing gallstones individually up to 19 millimeters diameter. There is a gallstone in the neck of the gallbladder on image 16. There is borderline to mild gallbladder wall thickening of 2-3 millimeters. However, no sonographic Murphy sign was elicited. Common bile duct: Diameter: 4 millimeters, normal. Liver: Echogenic liver (image 29). No discrete liver lesion. No intrahepatic biliary ductal dilatation. Portal vein is patent on color Doppler imaging with normal direction of blood flow towards the liver. Other findings: Negative visible right kidney. IMPRESSION: 1. Appearance suspicious for Acute Cholecystitis: - cholelithiasis, including a stone in the neck of the gallbladder. - mild gallbladder wall thickening, - but no sonographic Murphy sign elicited. 2. No  evidence of bile duct obstruction. 3. Fatty liver disease. Electronically Signed   By: Genevie Ann M.D.   On: 05/22/2018 13:19       ASSESMENT:   *  Acute jaundice.   MRCP: (fat containing?) lesion at [facility]OP.  No pancreatitis. No ductal dilatation. Mild focal thickening along the posterior wall of the common bile duct through the head of the pancreas.  Large GB stones and mild peri-cholecystic fluid.    LFTs improved overnight.    *  Routine screening colonoscopy with polypectomy 05/20/18.  Pathology not yet read.    PLAN   *  Lap chole, likely today..    *  Outpt EUS to detail the pancreas.  Messaged Dr Rush Landmark and this will be arranged.      Azucena Freed  05/23/2018, 8:19 AM Phone [phone]   Attending physician's note   I have reviewed the chart.  Patient is status post lap chole and was in recovery.  I agree with the Advanced Practitioner's note, impression and recommendations.   S/P lap chole, no IOC performed. MRCP -fat-containing lesion at [facility] without ductal dilatation.Plan: Trend LFTs as outpatient, EUS as outpatient Dr. Rush Landmark.  Will sign off for now.  Carmell Austria, MD

## 2018-05-23 NOTE — Discharge Instructions (Addendum)
[facility] ° °Please arrive at least 30 min before your appointment to complete your check in paperwork.  If you are unable to arrive 30 min prior to your appointment time we may have to cancel or reschedule you. °LAPAROSCOPIC SURGERY: POST OP INSTRUCTIONS °Always review your discharge instruction sheet given to you by the facility where your surgery was performed. °IF YOU HAVE DISABILITY OR FAMILY LEAVE FORMS, YOU MUST BRING THEM TO THE OFFICE FOR PROCESSING.   °DO NOT GIVE THEM TO YOUR DOCTOR. ° °PAIN CONTROL ° °1. First take acetaminophen (Tylenol) AND/or ibuprofen (Advil) to control your pain after surgery.  Follow directions on package.  Taking acetaminophen (Tylenol) and/or ibuprofen (Advil) regularly after surgery will help to control your pain and lower the amount of prescription pain medication you may need.  You should not take more than 4,000 mg (4 grams) of acetaminophen (Tylenol) in 24 hours.  You should not take ibuprofen (Advil), aleve, motrin, naprosyn or other NSAIDS if you have a history of stomach ulcers or chronic kidney disease.  °2. A prescription for pain medication may be given to you upon discharge.  Take your pain medication as prescribed, if you still have uncontrolled pain after taking acetaminophen (Tylenol) or ibuprofen (Advil). °3. Use ice packs to help control pain. °4. If you need a refill on your pain medication, please contact your pharmacy.  They will contact our office to request authorization. Prescriptions will not be filled after 5pm or on week-ends. ° °HOME MEDICATIONS °5. Take your usually prescribed medications unless otherwise directed. ° °DIET °6. You should follow a light diet the first few days after arrival home.  Be sure to include lots of fluids daily. Avoid fatty, fried foods.  ° °CONSTIPATION °7. It is common to experience some constipation after surgery and if you are taking pain medication.  Increasing fluid intake and taking a stool  softener (such as Colace) will usually help or prevent this problem from occurring.  A mild laxative (Milk of Magnesia or Miralax) should be taken according to package instructions if there are no bowel movements after 48 hours. ° °WOUND/INCISION CARE °8. Most patients will experience some swelling and bruising in the area of the incisions.  Ice packs will help.  Swelling and bruising can take several days to resolve.  °9. Unless discharge instructions indicate otherwise, follow guidelines below  °a. STERI-STRIPS - you may remove your outer bandages 48 hours after surgery, and you may shower at that time.  You have steri-strips (small skin tapes) in place directly over the incision.  These strips should be left on the skin for 7-10 days.   °b. DERMABOND/SKIN GLUE - you may shower in 24 hours.  The glue will flake off over the next 2-3 weeks. °10. Any sutures or staples will be removed at the office during your follow-up visit. ° °ACTIVITIES °11. You may resume regular (light) daily activities beginning the next day--such as daily self-care, walking, climbing stairs--gradually increasing activities as tolerated.  You may have sexual intercourse when it is comfortable.  Refrain from any heavy lifting or straining until approved by your doctor. °a. You may drive when you are no longer taking prescription pain medication, you can comfortably wear a seatbelt, and you can safely maneuver your car and apply brakes. ° °FOLLOW-UP °12. You should see your doctor in the office for a follow-up appointment approximately 2-3 weeks after your surgery.  You should have been given your post-op/follow-up appointment when   your surgery was scheduled.  If you did not receive a post-op/follow-up appointment, make sure that you call for this appointment within a day or two after you arrive home to insure a convenient appointment time. ° °OTHER INSTRUCTIONS ° °WHEN TO CALL YOUR DOCTOR: °1. Fever over 101.0 °2. Inability to  urinate °3. Continued bleeding from incision. °4. Increased pain, redness, or drainage from the incision. °5. Increasing abdominal pain ° °The clinic staff is available to answer your questions during regular business hours.  Please don’t hesitate to call and ask to speak to one of the nurses for clinical concerns.  If you have a medical emergency, go to the nearest emergency room or call 911.  A surgeon from [facility] is always on call at the hospital. °[address], [address], [place], [place]  [address] ? [address] [address], [place], [place]   [address] °[phone] ? [phone] ? FAX [phone] ° ° ° °

## 2018-05-23 NOTE — Transfer of Care (Signed)
Immediate Anesthesia Transfer of Care Note  Patient: Glenn Stephens  Procedure(s) Performed: LAPAROSCOPIC CHOLECYSTECTOMY (N/A )  Patient Location: PACU  Anesthesia Type:General  Level of Consciousness: awake, alert  and oriented  Airway & Oxygen Therapy: Patient Spontanous Breathing and Patient connected to nasal cannula oxygen  Post-op Assessment: Report given to RN, Post -op Vital signs reviewed and stable and Patient moving all extremities  Post vital signs: Reviewed and stable  Last Vitals:  Vitals Value Taken Time  BP 131/68 05/23/2018  1:48 PM  Temp    Pulse 65 05/23/2018  1:56 PM  Resp 12 05/23/2018  1:56 PM  SpO2 95 % 05/23/2018  1:56 PM  Vitals shown include unvalidated device data.  Last Pain:  Vitals:   05/23/18 1348  TempSrc:   PainSc: 0-No pain         Complications: No apparent anesthesia complications

## 2018-05-24 ENCOUNTER — Encounter (HOSPITAL_COMMUNITY): Payer: Self-pay | Admitting: Surgery

## 2018-05-24 DIAGNOSIS — K8689 Other specified diseases of pancreas: Secondary | ICD-10-CM

## 2018-05-24 LAB — COMPREHENSIVE METABOLIC PANEL
ALT: 234 U/L — AB (ref 0–44)
AST: 63 U/L — ABNORMAL HIGH (ref 15–41)
Albumin: 3.2 g/dL — ABNORMAL LOW (ref 3.5–5.0)
Alkaline Phosphatase: 113 U/L (ref 38–126)
Anion gap: 8 (ref 5–15)
BILIRUBIN TOTAL: 1.7 mg/dL — AB (ref 0.3–1.2)
BUN: 14 mg/dL (ref 8–23)
CHLORIDE: 103 mmol/L (ref 98–111)
CO2: 26 mmol/L (ref 22–32)
CREATININE: 1.03 mg/dL (ref 0.61–1.24)
Calcium: 8.8 mg/dL — ABNORMAL LOW (ref 8.9–10.3)
Glucose, Bld: 172 mg/dL — ABNORMAL HIGH (ref 70–99)
Potassium: 4 mmol/L (ref 3.5–5.1)
Sodium: 137 mmol/L (ref 135–145)
TOTAL PROTEIN: 6.4 g/dL — AB (ref 6.5–8.1)

## 2018-05-24 LAB — CBC
HEMATOCRIT: 38.1 % — AB (ref 39.0–52.0)
Hemoglobin: 12.4 g/dL — ABNORMAL LOW (ref 13.0–17.0)
MCH: 28.3 pg (ref 26.0–34.0)
MCHC: 32.5 g/dL (ref 30.0–36.0)
MCV: 87 fL (ref 80.0–100.0)
PLATELETS: 178 10*3/uL (ref 150–400)
RBC: 4.38 MIL/uL (ref 4.22–5.81)
RDW: 12.9 % (ref 11.5–15.5)
WBC: 6.5 10*3/uL (ref 4.0–10.5)
nRBC: 0 % (ref 0.0–0.2)

## 2018-05-24 LAB — GLUCOSE, CAPILLARY: Glucose-Capillary: 170 mg/dL — ABNORMAL HIGH (ref 70–99)

## 2018-05-24 LAB — MITOCHONDRIAL ANTIBODIES: Mitochondrial M2 Ab, IgG: <20 Units (ref 0.0–20.0)

## 2018-05-24 LAB — ANTI-SMOOTH MUSCLE ANTIBODY, IGG: F-Actin IgG: 7 Units (ref 0–19)

## 2018-05-24 MED ORDER — TRAMADOL HCL 50 MG PO TABS: 50.0000 mg | ORAL_TABLET | Freq: Four times a day (QID) | ORAL | 0 refills | Status: DC | PRN

## 2018-05-24 MED ORDER — TRAMADOL HCL 50 MG PO TABS: 50.0000 mg | ORAL_TABLET | Freq: Four times a day (QID) | ORAL | Status: DC | PRN

## 2018-05-24 MED ORDER — ACETAMINOPHEN 500 MG PO TABS: 1000.0000 mg | ORAL_TABLET | Freq: Three times a day (TID) | ORAL | Status: DC

## 2018-05-24 MED ORDER — GABAPENTIN 300 MG PO CAPS: 300.0000 mg | ORAL_CAPSULE | Freq: Two times a day (BID) | ORAL | Status: DC

## 2018-05-24 MED ORDER — METHOCARBAMOL 500 MG PO TABS: 1000.0000 mg | ORAL_TABLET | Freq: Four times a day (QID) | ORAL | Status: DC | PRN

## 2018-05-24 NOTE — Progress Notes (Signed)
UNDRAY ALLMAN [id number] [birth year]  CARE TEAM:  PCP: Unk Pinto, MD  Outpatient Care Team: Patient Care Team: Unk Pinto, MD as PCP - General (Internal Medicine) Carol Ada, MD as Consulting Physician (Gastroenterology) Serena Colonel, RN as [place] Management  Inpatient Treatment Team: Treatment Team: Attending Provider: Oda Kilts, MD; Consulting Physician: Edison Pace, Md, MD; Rounding Team: (Rounding), Imts - Orene Desanctis, MD; Registered Nurse: Kennith Center, RN; Technician: Gasper Sells, NT; Registered Nurse: Candice Camp, RN (Inactive)   Problem List:   Principal Problem:   Jaundice Active Problems:   Hypertension   Acute calculous cholecystitis s/p lap cholecystectopmy 05/23/2018   Elevated LFTs   Mass of head pancreas (fatty)   1 Day Post-Op  05/23/2018  Procedure(s): [facility] Stay = 2 days  Assessment  Improved  Plan:  -OK to d/c home from surgery standpoint -Jaundice most likely) syndrome type situation with stones and infundibulum causing compression.  Another possibility is the fatty mass of the pancreas.  Follow-up with Dr. Rush Landmark with [facility] gastroenterology to evaluate pancreatic mass.  Hopefully benign.  Follow-up with our office in a few weeks to make sure he continues to recover.  -VTE prophylaxis- SCDs, etc -mobilize as tolerated to help recovery  20 minutes spent in review, evaluation, examination, counseling, and coordination of care.  More than 50% of that time was spent in counseling.  I updated the patient's status to the Patient, his wife and daughter, medicine attending.  Recommendations were made.  Questions were answered.  They expressed understanding & appreciation.   05/24/2018    Subjective: (Chief complaint)  Up in chair.  Feeling well.  Family at bedside.  Medicine attending at bedside  Objective:  Vital signs:  Vitals:   05/23/18 1712 05/23/18 2127 05/24/18 0252 05/24/18 0606  BP: (!) 173/78 (!) 173/69 (!) 148/70 (!) 156/80  Pulse: (!) 57 (!) 55 (!) 54 (!) 50  Resp: _0 Temp: 98 F (36.7 C) 98.8 F (37.1 C) 98.2 F (36.8 C) 98.3 F (36.8 C)  TempSrc: Oral Oral Oral Oral  SpO2: 99% 97% 97% 98%  Weight:      Height:        Last BM Date: 05/21/18  Intake/Output   Yesterday:  11/15 0701 - 11/16 0700 In: 1086.3 [P.O.:440; I.V.:646.3] Out: 20 [Blood:20] This shift:  No intake/output data recorded.  Bowel function:  Flatus: YES  BM:  No  Drain: (No drain)   Physical Exam:  General: Pt awake/alert/oriented x4 in no acute distress Eyes: PERRL, normal EOM.  Sclera clear.  No icterus Neuro: CN II-XII intact w/o focal sensory/motor deficits. Lymph: No head/neck/groin lymphadenopathy Psych:  No delerium/psychosis/paranoia HENT: Normocephalic, Mucus membranes moist.  No thrush Neck: Supple, No tracheal deviation Chest: No chest wall pain w good excursion CV:  Pulses intact.  Regular rhythm MS: Normal AROM mjr joints.  No obvious deformity  Abdomen: Obese  Soft.  Nondistended.  Nontender.  Laparoscopic incisions clean dry and intact  no evidence of peritonitis.  No incarcerated hernias.  Ext:   No deformity.  No mjr edema.  No cyanosis Skin: No petechiae / purpura  Results:   Labs: Results for orders placed or performed during the hospital encounter of 05/22/18 (from the past 48 hour(s))  Comprehensive metabolic panel     Status: Abnormal   Collection Time: 05/22/18 11:34 AM  Result Value Ref Range   Sodium 134 (L) 135 - 145  mmol/L   Potassium 3.5 3.5 - 5.1 mmol/L   Chloride 101 98 - 111 mmol/L   CO2 23 22 - 32 mmol/L   Glucose, Bld 273 (H) 70 - 99 mg/dL   BUN 12 8 - 23 mg/dL   Creatinine, Ser 1.05 0.61 - 1.24 mg/dL   Calcium 9.2 8.9 - 10.3 mg/dL   Total Protein 7.0 6.5 - 8.1 g/dL   Albumin 3.6 3.5 - 5.0 g/dL   AST 241 (H) 15 - 41 U/L   ALT 501 (H) 0 - 44 U/L   Alkaline  Phosphatase 132 (H) 38 - 126 U/L   Total Bilirubin 5.6 (H) 0.3 - 1.2 mg/dL   GFR calc non Af Amer >60 >60 mL/min   GFR calc Af Amer >60 >60 mL/min    Comment: (NOTE) The eGFR has been calculated using the CKD EPI equation. This calculation has not been validated in all clinical situations. eGFR's persistently <60 mL/min signify possible Chronic Kidney Disease.    Anion gap 10 5 - 15    Comment: Performed at [facility] [address], [place], [place] [address]  Lipase, blood     Status: Abnormal   Collection Time: 05/22/18 11:34 AM  Result Value Ref Range   Lipase 112 (H) 11 - 51 U/L    Comment: Performed at [facility], [place] [address] [place], [place] [address]  CBC with Differential     Status: Abnormal   Collection Time: 05/22/18 11:34 AM  Result Value Ref Range   WBC 5.7 4.0 - 10.5 K/uL   RBC 4.79 4.22 - 5.81 MIL/uL   Hemoglobin 13.8 13.0 - 17.0 g/dL   HCT 42.8 39.0 - 52.0 %   MCV 89.4 80.0 - 100.0 fL   MCH 28.8 26.0 - 34.0 pg   MCHC 32.2 30.0 - 36.0 g/dL   RDW 12.9 11.5 - 15.5 %   Platelets 139 (L) 150 - 400 K/uL   nRBC 0.0 0.0 - 0.2 %   Neutrophils Relative % 71 %   Neutro Abs 4.0 1.7 - 7.7 K/uL   Lymphocytes Relative 20 %   Lymphs Abs 1.1 0.7 - 4.0 K/uL   Monocytes Relative 7 %   Monocytes Absolute 0.4 0.1 - 1.0 K/uL   Eosinophils Relative 2 %   Eosinophils Absolute 0.1 0.0 - 0.5 K/uL   Basophils Relative 0 %   Basophils Absolute 0.0 0.0 - 0.1 K/uL   Immature Granulocytes 0 %   Abs Immature Granulocytes 0.01 0.00 - 0.07 K/uL    Comment: Performed at [facility] [address] [place], [place] [address]  Protime-INR     Status: None   Collection Time: 05/22/18 11:34 AM  Result Value Ref Range   Prothrombin Time 14.5 11.4 - 15.2 seconds   INR 1.13     Comment: Performed at [facility] [address], [place], [place] [address]  Urinalysis, Routine w reflex microscopic     Status: Abnormal   Collection Time: 05/22/18 11:34 AM    Result Value Ref Range   Color, Urine AMBER (A) YELLOW    Comment: BIOCHEMICALS MAY BE AFFECTED BY COLOR   APPearance CLEAR CLEAR   Specific Gravity, Urine 1.031 (H) 1.005 - 1.030   pH 6.0 5.0 - 8.0   Glucose, UA >=500 (A) NEGATIVE mg/dL   Hgb urine dipstick NEGATIVE NEGATIVE   Bilirubin Urine NEGATIVE NEGATIVE   Ketones, ur 5 (A) NEGATIVE mg/dL  Protein, ur NEGATIVE NEGATIVE mg/dL   Nitrite NEGATIVE NEGATIVE   Leukocytes, UA NEGATIVE NEGATIVE   RBC / HPF 0-5 0 - 5 RBC/hpf   WBC, UA 0-5 0 - 5 WBC/hpf   Bacteria, UA NONE SEEN NONE SEEN   Squamous Epithelial / LPF 0-5 0 - 5    Comment: Performed at [facility], [place] [address] [place], [place] [address]  Hepatitis panel, acute     Status: None   Collection Time: 05/22/18 11:34 AM  Result Value Ref Range   Hepatitis B Surface Ag Negative Negative   HCV Ab 0.2 0.0 - 0.9 s/co ratio    Comment: (NOTE)                                  Negative:     < 0.8                             Indeterminate: 0.8 - 0.9                                  Positive:     > 0.9 The CDC recommends that a positive HCV antibody result be followed up with a HCV Nucleic Acid Amplification test (768115). Performed At: [facility] [place], [place] [id number] Rush Farmer MD [id number]    Hep A IgM Negative Negative   Hep B C IgM Negative Negative  Bilirubin, direct     Status: Abnormal   Collection Time: 05/22/18 11:34 AM  Result Value Ref Range   Bilirubin, Direct 3.3 (H) 0.0 - 0.2 mg/dL    Comment: Performed at [facility] [address], [place], [place] [address]  I-Stat CG4 Lactic Acid, ED     Status: None   Collection Time: 05/22/18 11:54 AM  Result Value Ref Range   Lactic Acid, Venous 1.90 0.5 - 1.9 mmol/L  HIV antibody (Routine Testing)     Status: None   Collection Time: 05/22/18  3:20 PM  Result Value Ref Range   HIV Screen 4th Generation wRfx Non Reactive Non Reactive    Comment:  (NOTE) Performed At: [facility] [address] [place], [place] [id number] Rush Farmer MD [id number]   Glucose, capillary     Status: None   Collection Time: 05/22/18  5:44 PM  Result Value Ref Range   Glucose-Capillary 79 70 - 99 mg/dL  Acetaminophen level     Status: Abnormal   Collection Time: 05/22/18  8:31 PM  Result Value Ref Range   Acetaminophen (Tylenol), Serum <10 (L) 10 - 30 ug/mL    Comment: (NOTE) Therapeutic concentrations vary significantly. A range of 10-30 ug/mL  may be an effective concentration for many patients. However, some  are best treated at concentrations outside of this range. Acetaminophen concentrations >150 ug/mL at 4 hours after ingestion  and >50 ug/mL at 12 hours after ingestion are often associated with  toxic reactions. Performed at [facility], [place] [address], [place], [place] [address]   Glucose, capillary     Status: None   Collection Time: 05/22/18  9:46 PM  Result Value Ref Range   Glucose-Capillary 76 70 - 99 mg/dL  Comprehensive metabolic panel     Status: Abnormal   Collection Time: 05/23/18  3:18 AM  Result Value Ref Range   Sodium 138 135 - 145 mmol/L   Potassium 3.7 3.5 - 5.1 mmol/L   Chloride 107 98 - 111 mmol/L   CO2 23 22 - 32 mmol/L   Glucose, Bld 84 70 - 99 mg/dL   BUN 8 8 - 23 mg/dL   Creatinine, Ser 0.96 0.61 - 1.24 mg/dL   Calcium 8.6 (L) 8.9 - 10.3 mg/dL   Total Protein 5.9 (L) 6.5 - 8.1 g/dL   Albumin 3.1 (L) 3.5 - 5.0 g/dL   AST 122 (H) 15 - 41 U/L   ALT 316 (H) 0 - 44 U/L   Alkaline Phosphatase 114 38 - 126 U/L   Total Bilirubin 3.4 (H) 0.3 - 1.2 mg/dL   GFR calc non Af Amer >60 >60 mL/min   GFR calc Af Amer >60 >60 mL/min    Comment: (NOTE) The eGFR has been calculated using the CKD EPI equation. This calculation has not been validated in all clinical situations. eGFR's persistently <60 mL/min signify possible Chronic Kidney Disease.    Anion gap 8 5 - 15    Comment: Performed at  [facility] [address] [place], [place] [address]  CBC     Status: Abnormal   Collection Time: 05/23/18  3:18 AM  Result Value Ref Range   WBC 4.3 4.0 - 10.5 K/uL   RBC 4.13 (L) 4.22 - 5.81 MIL/uL   Hemoglobin 12.0 (L) 13.0 - 17.0 g/dL   HCT 36.0 (L) 39.0 - 52.0 %   MCV 87.2 80.0 - 100.0 fL   MCH 29.1 26.0 - 34.0 pg   MCHC 33.3 30.0 - 36.0 g/dL   RDW 13.0 11.5 - 15.5 %   Platelets 138 (L) 150 - 400 K/uL   nRBC 0.0 0.0 - 0.2 %    Comment: Performed at [facility], [place] [address] [place], [place] [address]  Lipase, blood     Status: None   Collection Time: 05/23/18  3:18 AM  Result Value Ref Range   Lipase 46 11 - 51 U/L    Comment: Performed at [facility] [address], [place], [place] [address]  Surgical pcr screen     Status: Abnormal   Collection Time: 05/23/18  8:42 AM  Result Value Ref Range   MRSA, PCR NEGATIVE NEGATIVE   Staphylococcus aureus POSITIVE (A) NEGATIVE    Comment: (NOTE) The Xpert SA Assay (FDA approved for NASAL specimens in patients 44 years of age and older), is one component of a comprehensive surveillance program. It is not intended to diagnose infection nor to guide or monitor treatment. Performed at [facility], [place] [address], [place],  [address]   Glucose, capillary     Status: None   Collection Time: 05/23/18  9:00 AM  Result Value Ref Range   Glucose-Capillary 72 70 - 99 mg/dL   Comment 1 Notify RN    Comment 2 Document in Chart   Glucose, capillary     Status: None   Collection Time: 05/23/18  1:51 PM  Result Value Ref Range   Glucose-Capillary 79 70 - 99 mg/dL   Comment 1 Notify RN   Glucose, capillary     Status: None   Collection Time: 05/23/18  2:39 PM  Result Value Ref Range   Glucose-Capillary 84 70 - 99 mg/dL   Comment 1 Notify RN    Comment 2 Document in Chart  Glucose, capillary     Status: Abnormal   Collection Time: 05/23/18  5:09 PM  Result Value Ref Range   Glucose-Capillary  174 (H) 70 - 99 mg/dL   Comment 1 Notify RN    Comment 2 Document in Chart   Glucose, capillary     Status: Abnormal   Collection Time: 05/23/18  9:30 PM  Result Value Ref Range   Glucose-Capillary 260 (H) 70 - 99 mg/dL  CBC     Status: Abnormal   Collection Time: 05/24/18  5:23 AM  Result Value Ref Range   WBC 6.5 4.0 - 10.5 K/uL   RBC 4.38 4.22 - 5.81 MIL/uL   Hemoglobin 12.4 (L) 13.0 - 17.0 g/dL   HCT 38.1 (L) 39.0 - 52.0 %   MCV 87.0 80.0 - 100.0 fL   MCH 28.3 26.0 - 34.0 pg   MCHC 32.5 30.0 - 36.0 g/dL   RDW 12.9 11.5 - 15.5 %   Platelets 178 150 - 400 K/uL   nRBC 0.0 0.0 - 0.2 %    Comment: Performed at [facility], [place] [address] [place], [place] [address]  Comprehensive metabolic panel     Status: Abnormal   Collection Time: 05/24/18  5:23 AM  Result Value Ref Range   Sodium 137 135 - 145 mmol/L   Potassium 4.0 3.5 - 5.1 mmol/L   Chloride 103 98 - 111 mmol/L   CO2 26 22 - 32 mmol/L   Glucose, Bld 172 (H) 70 - 99 mg/dL   BUN 14 8 - 23 mg/dL   Creatinine, Ser 1.03 0.61 - 1.24 mg/dL   Calcium 8.8 (L) 8.9 - 10.3 mg/dL   Total Protein 6.4 (L) 6.5 - 8.1 g/dL   Albumin 3.2 (L) 3.5 - 5.0 g/dL   AST 63 (H) 15 - 41 U/L   ALT 234 (H) 0 - 44 U/L   Alkaline Phosphatase 113 38 - 126 U/L   Total Bilirubin 1.7 (H) 0.3 - 1.2 mg/dL   GFR calc non Af Amer >60 >60 mL/min   GFR calc Af Amer >60 >60 mL/min    Comment: (NOTE) The eGFR has been calculated using the CKD EPI equation. This calculation has not been validated in all clinical situations. eGFR's persistently <60 mL/min signify possible Chronic Kidney Disease.    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 Heavily T2-weighted images of the biliary and pancreatic ducts were obtained, and three-dimensional MRCP images were rendered by post processing. CONTRAST:  6 mL Gadavist COMPARISON:  Ultrasound 05/22/2018 FINDINGS: Lower chest:  Lung bases are clear. Hepatobiliary: No intrahepatic biliary duct dilatation. No extrahepatic biliary duct dilatation. The common bile duct measures 5-6 mm which is within normal limits. No filling defect within the common bile duct. There is may be subtle focal thickening along the posterior wall of the common bile  duct (image 23/3). This is not seen on all sequences. This is adjacent to the a pancreatic lesion described below. There is a large gallstone in the neck of the gallbladder measuring 16 mm. Large gallstone in the fundus of the gallbladder measuring 19 mm. There is mild pericholecystic fluid (image 21/3). No gallbladder distension of the gallbladder measuring 2.3 cm in diameter. Pancreas: Within head of the pancreas there is a rounded lesion measuring 2.7 by 2.2 cm which is hypointense on noncontrast T1 weighted imaging compared to the normal pancreatic parenchyma (image 66/18). This lesion has mixed signal intensity on T2 weighted imaging (image 25/3 and image 27/7). Lesion demonstrates minimal post-contrast enhancement (series 24). Apparent loss of signal intensity on the fat-suppressed imaging suggest fat containing lesion. There is no dilatation of the pancreatic duct no pancreatic inflammation Spleen: Normal spleen.  Splenule at splenic hilum. Adrenals/urinary tract: Adrenal glands and kidneys are normal. Stomach/Bowel: Stomach and limited of the  small bowel is unremarkable Vascular/Lymphatic: Abdominal aortic normal caliber. No retroperitoneal periportal lymphadenopathy. Musculoskeletal: No aggressive osseous lesion IMPRESSION: 1. Ovoid lesion in the head of the pancreas which appears to contain fat which typically indicates a benign process. Differential include lipoma in the head of the pancreas versus an atypical periampullary duodenum diverticulum. Other pancreatic neoplastic process cannot be completely excluded. No pancreatic duct dilatation. Pancreas was not imaged on limited comparison ultrasound. 2. No evidence of biliary obstruction. There is mild focal thickening along the posterior wall of the common bile duct through the head of the pancreas. No common bile duct dilatation. No intrahepatic duct dilatation 3. Two large gallstones in the gallbladder one towards the neck. Mild pericholecystic fluid. No gallbladder distension. These results will be called to the ordering clinician or representative by the Radiologist Assistant, and communication documented in the PACS or zVision Dashboard. Electronically Signed   By: Suzy Bouchard M.D.   On: 05/23/2018 08:01   US Abdomen Limited  Result Date: 05/22/2018 CLINICAL DATA:  62 year old male with jaundice.  Recent colonoscopy. EXAM: ULTRASOUND ABDOMEN LIMITED RIGHT UPPER QUADRANT COMPARISON:  None. FINDINGS: Gallbladder: Shadowing gallstones individually up to 19 millimeters diameter. There is a gallstone in the neck of the gallbladder on image 16. There is borderline to mild gallbladder wall thickening of 2-3 millimeters. However, no sonographic Murphy sign was elicited. Common bile duct: Diameter: 4 millimeters, normal. Liver: Echogenic liver (image 29). No discrete liver lesion. No intrahepatic biliary ductal dilatation. Portal vein is patent on color Doppler imaging with normal direction of blood flow towards the liver. Other findings: Negative visible right kidney. IMPRESSION: 1. Appearance  suspicious for Acute Cholecystitis: - cholelithiasis, including a stone in the neck of the gallbladder. - mild gallbladder wall thickening, - but no sonographic Murphy sign elicited. 2. No evidence of bile duct obstruction. 3. Fatty liver disease. Electronically Signed   By: Genevie Ann M.D.   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 Heavily T2-weighted images of the biliary and pancreatic ducts were obtained, and  three-dimensional MRCP images were rendered by post processing. CONTRAST:  6 mL Gadavist COMPARISON:  Ultrasound 05/22/2018 FINDINGS: Lower chest:  Lung bases are clear. Hepatobiliary: No intrahepatic biliary duct dilatation. No extrahepatic biliary duct dilatation. The common bile duct measures 5-6 mm which is within normal limits. No filling defect within the common bile duct. There is may be subtle focal thickening along the posterior wall of the common bile duct (image 23/3). This is not seen on all sequences. This is adjacent to the a pancreatic lesion described below. There is a large gallstone in the neck of the gallbladder measuring 16 mm. Large gallstone in the fundus of the gallbladder measuring 19 mm. There is mild pericholecystic fluid (image 21/3). No gallbladder distension of the gallbladder measuring 2.3 cm in diameter. Pancreas: Within head of the pancreas there is a rounded lesion measuring 2.7 by 2.2 cm which is hypointense on noncontrast T1 weighted  imaging compared to the normal pancreatic parenchyma (image 66/18). This lesion has mixed signal intensity on T2 weighted imaging (image 25/3 and image 27/7). Lesion demonstrates minimal post-contrast enhancement (series 24). Apparent loss of signal intensity on the fat-suppressed imaging suggest fat containing lesion. There is no dilatation of the pancreatic duct no pancreatic inflammation Spleen: Normal spleen.  Splenule at splenic hilum. Adrenals/urinary tract: Adrenal glands and kidneys are normal. Stomach/Bowel: Stomach and limited of the small bowel is unremarkable Vascular/Lymphatic: Abdominal aortic normal caliber. No retroperitoneal periportal lymphadenopathy. Musculoskeletal: No aggressive osseous lesion IMPRESSION: 1. Ovoid lesion in the head of the pancreas which appears to contain fat which typically indicates a benign process. Differential include lipoma in the head of the pancreas versus an atypical periampullary duodenum diverticulum. Other pancreatic neoplastic process cannot be completely excluded. No pancreatic duct dilatation. Pancreas was not imaged on limited comparison ultrasound. 2. No evidence of biliary obstruction. There is mild focal thickening along the posterior wall of the common bile duct through the head of the pancreas. No common bile duct dilatation. No intrahepatic duct dilatation 3. Two large gallstones in the gallbladder one towards the neck. Mild pericholecystic fluid. No gallbladder distension. These results will be called to the ordering clinician or representative by the Radiologist Assistant, and communication documented in the PACS or zVision Dashboard. Electronically Signed   By: Suzy Bouchard M.D.   On: 05/23/2018 08:01    Medications / Allergies: per chart  Antibiotics: Anti-infectives (From admission, onward)   Start     Dose/Rate Route Frequency Ordered Stop   05/23/18 0800  cefTRIAXone (ROCEPHIN) 2 g in sodium chloride 0.9 % 100 mL IVPB     2 g 200 mL/hr  over 30 Minutes Intravenous On call to O.R. 05/23/18 0752 05/23/18 1212        Note: Portions of this report may have been transcribed using voice recognition software. Every effort was made to ensure accuracy; however, inadvertent computerized transcription errors may be present.   Any transcriptional errors that result from this process are unintentional.     Adin Hector, MD, FACS, MASCRS Gastrointestinal and Minimally Invasive Surgery    [address] [place], [place] [address] [phone] Main / Paging [phone] Fax

## 2018-05-24 NOTE — Progress Notes (Signed)
Patient given discharge instructions. Patient verbalized understanding. Patient left unit in stable condition with family.

## 2018-05-24 NOTE — Progress Notes (Signed)
   Subjective: Mr. Arca feels much better today.  He is ready to go home.  He denies any shortness of breath, chest pain, dizziness, headache.  He does state that he has some soreness at the laparoscopic procedure site.  He has not requested any narcotics postop.  Objective:  Vital signs in last 24 hours: Vitals:   05/23/18 1712 05/23/18 2127 05/24/18 0252 05/24/18 0606  BP: (!) 173/78 (!) 173/69 (!) 148/70 (!) 156/80  Pulse: (!) 57 (!) 55 (!) 54 (!) 50  Resp: 18 19 19 19   Temp: 98 F (36.7 C) 98.8 F (37.1 C) 98.2 F (36.8 C) 98.3 F (36.8 C)  TempSrc: Oral Oral Oral Oral  SpO2: 99% 97% 97% 98%  Weight:      Height:       Physical Exam  Constitutional: He is oriented to person, place, and time. He appears well-developed and well-nourished.  HENT:  Head: Normocephalic and atraumatic.  Eyes: EOM are normal.  Neck: Normal range of motion. Neck supple.  Cardiovascular: Normal rate and regular rhythm.  Pulmonary/Chest: Effort normal and breath sounds normal.  Abdominal: Soft. Bowel sounds are normal. He exhibits no distension. There is no tenderness.  Musculoskeletal: He exhibits no edema or tenderness.  Neurological: He is alert and oriented to person, place, and time.  Skin: Skin is warm and dry.  Psychiatric: He has a normal mood and affect. His behavior is normal.  Nursing note and vitals reviewed.   Assessment/Plan:  Principal Problem:   Jaundice Active Problems:   Hypertension   Acute calculous cholecystitis s/p lap cholecystectopmy 05/23/2018   Elevated LFTs   Mass of head pancreas (fatty)  Mr. Denherder presented with jaundice and found to have hyperbilirubinemia, transaminitis, elevated lipase and alkaline phosphatase thought to be secondary to choledocholithiasis with passage of stone now POD1 s/p laparoscopic cholecystectomy.  Transaminitis/hyperbilirubinemia likely secondary to choledocholithiasis: LFTs and bilirubin have improved dramatically over the last  several days. 1.  We will need repeat CMP at hospital follow-up 2.  Follow-up with general surgery to post discharge  Ovoid lesion found in the head of the pancreas: Differential includes lipoma, atypical periampullary duodenum diverticulum and pancreatic neoplastic process. 1.  He will likely need a biopsy of the pancreas to determine etiology.  Type 2 DM:On Synjardy XR 25-1000 mg daily at home.  Moderate SSI in hospital.  Blood sugar has remained within acceptable limits. 1.  Restart Synjardy XR 25-1000 mg daily at discharge  HLD: Stable 1.  Continue Crestor 5 mg daily and Zetia 10 mg daily at discharge  HTN: Mildly hypertensive  throughout admission systolics 300T to 622Q while on home losartan 50 mg daily. 1.  He will need a blood pressure recheck at follow-up to determine need for additional antihypertensive medications.  Restless leg syndrome:Is on gabapentin 600 mg daily at home. Stable. 1.  Continue gabapentin 600 mg daily discharge  New systolic murmur: Asymptomatic.  No previous echo. 1.  Recommend echo outpatient  Diet:liquid IV fluid:None VTE JFH:[id number] Code status:Full  Dispo: Anticipated discharge today  Carroll Sage, MD 05/24/2018, 1:48 PM Pager: [phone]

## 2018-05-26 ENCOUNTER — Telehealth: Payer: Self-pay | Admitting: Gastroenterology

## 2018-05-26 ENCOUNTER — Telehealth: Payer: Self-pay

## 2018-05-26 LAB — ANTINUCLEAR ANTIBODIES, IFA: ANA Ab, IFA: NEGATIVE

## 2018-05-26 MED FILL — EZETIMIBE 10 MG TABLET: 10 | 30 days supply | Qty: 30 | Fill #1

## 2018-05-26 MED FILL — traMADol HCL 50 MG TABS: 50 | 3 days supply | Qty: 20 | Fill #0

## 2018-05-26 NOTE — Telephone Encounter (Signed)
appt made for 06/11/18 with Dr Rush Landmark.

## 2018-05-26 NOTE — Telephone Encounter (Signed)
See alternate note the appt has been made

## 2018-05-26 NOTE — Telephone Encounter (Signed)
-----   Message from Marlon Pel, RN sent at 05/23/2018 10:29 AM EST ----- Regarding: FW: schedule an EUS Tried to schedule with his wife.  She declined my offered appts.  She will call back on Monday to schedule  Sheri ----- Message ----- From: Irving Copas., MD Sent: 05/23/2018   8:38 AM EST To: Vena Rua, PA-C, Timothy Lasso, RN, # Subject: RE: schedule an EUS                            Thanks Sarah for letting me know. Just spoke with Dr. Lyndel Safe. [name], please schedule this patient for a follow up with me in 4-6 weeks and we can talk and determine timing of an EUS in 2020. Thanks. Gabe ----- Message ----- From: Clearence Cheek Sent: 05/23/2018   8:28 AM EST To: Irving Copas., MD, Jackquline Denmark, MD Subject: schedule an EUS                                Hi Gabe.   Pt has (fat-containing?) lesion at [facility] on MRCP.  Ducts not obstructed.  LFTs declining.  Lap chole planned 11/15.   Needs EUS to eval HOP.   Thanks.

## 2018-05-26 NOTE — Telephone Encounter (Signed)
Patient wife returning Patty's call

## 2018-05-27 ENCOUNTER — Ambulatory Visit: Payer: 59 | Admitting: Physician Assistant

## 2018-05-27 ENCOUNTER — Encounter: Payer: Self-pay | Admitting: Physician Assistant

## 2018-05-27 ENCOUNTER — Encounter: Payer: Self-pay | Admitting: Gastroenterology

## 2018-05-27 VITALS — BP 134/84 | HR 76 | Temp 97.8°F | Ht 72.0 in | Wt 230.2 lb

## 2018-05-27 DIAGNOSIS — K8689 Other specified diseases of pancreas: Secondary | ICD-10-CM

## 2018-05-27 DIAGNOSIS — E1122 Type 2 diabetes mellitus with diabetic chronic kidney disease: Secondary | ICD-10-CM | POA: Diagnosis not present

## 2018-05-27 DIAGNOSIS — E1142 Type 2 diabetes mellitus with diabetic polyneuropathy: Secondary | ICD-10-CM

## 2018-05-27 DIAGNOSIS — R011 Cardiac murmur, unspecified: Secondary | ICD-10-CM | POA: Diagnosis not present

## 2018-05-27 DIAGNOSIS — N181 Chronic kidney disease, stage 1: Secondary | ICD-10-CM | POA: Diagnosis not present

## 2018-05-27 DIAGNOSIS — K8000 Calculus of gallbladder with acute cholecystitis without obstruction: Secondary | ICD-10-CM

## 2018-05-27 DIAGNOSIS — R17 Unspecified jaundice: Secondary | ICD-10-CM | POA: Diagnosis not present

## 2018-05-27 NOTE — Patient Instructions (Addendum)
Please go to the ER if you have any severe AB pain, unable to hold down food/water, blood in stool or vomit, chest pain, shortness of breath, or any worsening symptoms.     HYPERTENSION INFORMATION  Monitor your blood pressure at home, please keep a record and bring that in with you to your next office visit.   Go to the ER if any CP, SOB, nausea, dizziness, severe HA, changes vision/speech  Due to a recent study, SPRINT, we have changed our goal for the systolic or top blood pressure number. Ideally we want your top number at 120.  In the [facility] Trial, 5000 people were randomized to a goal BP of 120 and 5000 people were randomized to a goal BP of less than 140. The patients with the goal BP at 120 had LESS DEMENTIA, LESS HEART ATTACKS, AND LESS STROKES, AS WELL AS OVERALL DECREASED MORTALITY OR DEATH RATE.   If you are willing, our goal BP is the top number of 120.  Your most recent BP: BP: 134/84   Take your medications faithfully as instructed. Maintain a healthy weight. Get at least 150 minutes of aerobic exercise per week. Minimize salt intake. Minimize alcohol intake  DASH Eating Plan DASH stands for "Dietary Approaches to Stop Hypertension." The DASH eating plan is a healthy eating plan that has been shown to reduce high blood pressure (hypertension). Additional health benefits may include reducing the risk of type 2 diabetes mellitus, heart disease, and stroke. The DASH eating plan may also help with weight loss. WHAT DO I NEED TO KNOW ABOUT THE DASH EATING PLAN? For the DASH eating plan, you will follow these general guidelines:  Choose foods with a percent daily value for sodium of less than 5% (as listed on the food label).  Use salt-free seasonings or herbs instead of table salt or sea salt.  Check with your health care provider or pharmacist before using salt substitutes.  Eat lower-sodium products, often labeled as "lower sodium" or "no salt added."  Eat fresh  foods.  Eat more vegetables, fruits, and low-fat dairy products.  Choose whole grains. Look for the word "whole" as the first word in the ingredient list.  Choose fish and skinless chicken or [place] more often than red meat. Limit fish, poultry, and meat to 6 oz (170 g) each day.  Limit sweets, desserts, sugars, and sugary drinks.  Choose heart-healthy fats.  Limit cheese to 1 oz (28 g) per day.  Eat more home-cooked food and less restaurant, buffet, and fast food.  Limit fried foods.  Cook foods using methods other than frying.  Limit canned vegetables. If you do use them, rinse them well to decrease the sodium.  When eating at a restaurant, ask that your food be prepared with less salt, or no salt if possible. WHAT FOODS CAN I EAT? Seek help from a dietitian for individual calorie needs. Grains Whole grain or whole wheat bread. Brown rice. Whole grain or whole wheat pasta. Quinoa, bulgur, and whole grain cereals. Low-sodium cereals. Corn or whole wheat flour tortillas. Whole grain cornbread. Whole grain crackers. Low-sodium crackers. Vegetables Fresh or frozen vegetables (raw, steamed, roasted, or grilled). Low-sodium or reduced-sodium tomato and vegetable juices. Low-sodium or reduced-sodium tomato sauce and paste. Low-sodium or reduced-sodium canned vegetables.  Fruits All fresh, canned (in natural juice), or frozen fruits. Meat and Other Protein Products Ground beef (85% or leaner), grass-fed beef, or beef trimmed of fat. Skinless chicken or [place]. Ground chicken or [place].  Pork trimmed of fat. All fish and seafood. Eggs. Dried beans, peas, or lentils. Unsalted nuts and seeds. Unsalted canned beans. Dairy Low-fat dairy products, such as skim or 1% milk, 2% or reduced-fat cheeses, low-fat ricotta or cottage cheese, or plain low-fat yogurt. Low-sodium or reduced-sodium cheeses. Fats and Oils Tub margarines without trans fats. Light or reduced-fat mayonnaise and salad dressings  (reduced sodium). Avocado. Safflower, olive, or canola oils. Natural peanut or almond butter. Other Unsalted popcorn and pretzels. The items listed above may not be a complete list of recommended foods or beverages. Contact your dietitian for more options. WHAT FOODS ARE NOT RECOMMENDED? Grains White bread. White pasta. White rice. Refined cornbread. Bagels and croissants. Crackers that contain trans fat. Vegetables Creamed or fried vegetables. Vegetables in a cheese sauce. Regular canned vegetables. Regular canned tomato sauce and paste. Regular tomato and vegetable juices. Fruits Dried fruits. Canned fruit in light or heavy syrup. Fruit juice. Meat and Other Protein Products Fatty cuts of meat. Ribs, chicken wings, bacon, sausage, bologna, salami, chitterlings, fatback, hot dogs, bratwurst, and packaged luncheon meats. Salted nuts and seeds. Canned beans with salt. Dairy Whole or 2% milk, cream, half-and-half, and cream cheese. Whole-fat or sweetened yogurt. Full-fat cheeses or blue cheese. Nondairy creamers and whipped toppings. Processed cheese, cheese spreads, or cheese curds. Condiments Onion and garlic salt, seasoned salt, table salt, and sea salt. Canned and packaged gravies. Worcestershire sauce. Tartar sauce. Barbecue sauce. Teriyaki sauce. Soy sauce, including reduced sodium. Steak sauce. Fish sauce. Oyster sauce. Cocktail sauce. Horseradish. Ketchup and mustard. Meat flavorings and tenderizers. Bouillon cubes. Hot sauce. Tabasco sauce. Marinades. Taco seasonings. Relishes. Fats and Oils Butter, stick margarine, lard, shortening, ghee, and bacon fat. Coconut, palm kernel, or palm oils. Regular salad dressings. Other Pickles and olives. Salted popcorn and pretzels. The items listed above may not be a complete list of foods and beverages to avoid. Contact your dietitian for more information. WHERE CAN I FIND MORE INFORMATION? National [employer], [employer], and [employer] Institute:  travelstabl[employer] Document Released: 06/14/2011 Document Revised: 11/09/2013 Document Reviewed: 04/29/2013 [facility] Patient Information 2015 [place], [place]. This information is not intended to replace advice given to you by your health care provider. Make sure you discuss any questions you have with your health care provider.

## 2018-05-27 NOTE — Progress Notes (Signed)
Hospital follow up  Assessment and Plan: Hospital visit follow up for   Acute calculous cholecystitis s/p lap cholecystectmy 05/23/2018 Healing well, no AB pain, jaundice improving -     CBC with Differential/Platelet -     COMPLETE METABOLIC PANEL WITH GFR  Mass of head pancreas (fatty) has follow up  -     CBC with Differential/Platelet -     COMPLETE METABOLIC PANEL WITH GFR  Jaundice Improved per patient and wife -     CBC with Differential/Platelet -     COMPLETE METABOLIC PANEL WITH GFR  Type 2 diabetes mellitus with stage 1 chronic kidney disease, without long-term current use of insulin (HCC) Continue medication, continue weight loss.   Diabetic peripheral neuropathy associated with type 2 diabetes mellitus ([place]) Continue medication, continue weight loss.   Systolic murmur -     ECHOCARDIOGRAM COMPLETE; Future  Hospital discharge meds were reviewed, and reconciled with the patient.    Medications Discontinued During This Encounter  Medication Reason  . acetaminophen (TYLENOL) 500 MG tablet Completed Course      Over 40 minutes of exam, counseling, chart review, and complex, high/moderate level critical decision making was performed this visit.   Future Appointments  Date Time Provider [place]  06/11/2018  9:45 AM Mansouraty, Telford Nab., MD [facility]  08/04/2018  9:30 AM Vicie Mutters, PA-C [facility] None  04/28/2019  9:00 AM Vicie Mutters, PA-C [facility] None      HPI 62 y.o.male presents for follow up for transition from recent hospitalization or SNIF stay. Admit date to the hospital was 05/22/18, patient was discharged from the hospital on 05/24/18. The discharge summary, medications, and diagnostic test results were reviewed before meeting with the patient. The patient was admitted for:   He presented with painless jaundice, found to have choleodocolithiasis, s/p cholecystectomy on 05/23/2018.  He also was found to have a mass  on his pancreatic head,  will follow up with Dr. Rush Landmark with GI on 12/04.  His autoimmune work up was all negative.ANA, Antismooth Ab, mitochondrial Ab, HIV  were all negative.   Has follow up  with general surgery 06/09/2018.   He states his appetite is getting back to normal, he is still watching what he will eat. 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   He was hypertensive throughout his admission, he is on losartan 50 and nothing was changed and his BP is doing better at this time.  BP Readings from Last 5 Encounters:  05/27/18 134/84  05/24/18 (!) 156/80  05/20/18 (!) 146/74  04/17/18 124/86  01/21/18 135/71   Has a murmur but he states he has had x a kid, no SOB, no edema.   BMI is Body mass index is 31.22 kg/m. He has lost a significant amount of weight due to recent colonoscopy and then during the hospitalization he did not eat much.   Wt Readings from Last 10 Encounters:  05/27/18 230 lb 3.2 oz (104.4 kg)  05/23/18 241 lb (109.3 kg)  05/20/18 241 lb (109.3 kg)  05/06/18 244 lb (110.7 kg)  04/17/18 241 lb 9.6 oz (109.6 kg)  01/15/18 242 lb (109.8 kg)  10/15/17 236 lb 12.8 oz (107.4 kg)  07/11/17 241 lb (109.3 kg)  03/26/17 242 lb 6.4 oz (110 kg)  11/02/16 246 lb (111.6 kg)  Home health is not involved.   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 Heavily T2-weighted images of the biliary and pancreatic ducts were obtained, and three-dimensional MRCP images were rendered by post processing. CONTRAST:  6 mL Gadavist COMPARISON:  Ultrasound 05/22/2018 FINDINGS: Lower chest:  Lung bases are clear. Hepatobiliary: No intrahepatic biliary duct dilatation. No extrahepatic biliary duct dilatation. The common bile duct measures 5-6 mm which is within normal limits. No filling defect within the common bile duct. There is may be subtle focal thickening along the posterior wall of the common bile duct (image 23/3). This is not seen on all sequences. This is adjacent to the a pancreatic lesion described below. There is a large gallstone in the neck of the gallbladder measuring 16 mm. Large gallstone in the fundus of the gallbladder measuring 19 mm. There is mild pericholecystic fluid (image 21/3). No gallbladder distension of the gallbladder measuring 2.3 cm in diameter. Pancreas: Within head of the pancreas there is a rounded lesion measuring 2.7 by 2.2 cm which is hypointense on noncontrast T1 weighted imaging compared to the normal pancreatic parenchyma (image 66/18). This lesion has mixed signal intensity on T2 weighted imaging (image 25/3 and image 27/7). Lesion demonstrates minimal post-contrast enhancement (series 24). Apparent loss of signal intensity on the fat-suppressed imaging suggest fat containing lesion. There is no dilatation of the pancreatic duct no pancreatic inflammation Spleen: Normal spleen.  Splenule at splenic hilum. Adrenals/urinary tract: Adrenal glands and kidneys are normal. Stomach/Bowel: Stomach and limited of the small bowel is unremarkable Vascular/Lymphatic: Abdominal aortic normal caliber. No retroperitoneal periportal lymphadenopathy. Musculoskeletal: No aggressive osseous lesion IMPRESSION: 1. Ovoid lesion in the head of the pancreas which appears to  contain fat which typically indicates a benign process. Differential include lipoma in the head of the pancreas versus an atypical periampullary duodenum diverticulum. Other pancreatic neoplastic process cannot be completely excluded. No pancreatic duct dilatation. Pancreas was not imaged on limited comparison ultrasound. 2. No evidence of biliary obstruction. There is mild focal thickening along the posterior wall of the common bile duct through the head of the pancreas. No common bile duct dilatation. No intrahepatic duct dilatation 3. Two large gallstones in the gallbladder one towards the neck. Mild pericholecystic fluid. No gallbladder distension. These results will be called to the ordering clinician or representative by the Radiologist Assistant, and communication documented in the PACS or zVision Dashboard. Electronically Signed   By: Suzy Bouchard M.D.   On: 05/23/2018 08:01   US Abdomen Limited  Result Date: 05/22/2018 CLINICAL DATA:  62 year old male with jaundice.  Recent colonoscopy. EXAM: ULTRASOUND ABDOMEN LIMITED RIGHT UPPER QUADRANT COMPARISON:  None. FINDINGS: Gallbladder: Shadowing gallstones individually up to 19 millimeters diameter. There is a gallstone in the neck of the gallbladder on image 16. There is borderline to mild gallbladder wall thickening of 2-3 millimeters. However, no sonographic Murphy sign was elicited. Common bile duct: Diameter: 4 millimeters, normal. Liver: Echogenic liver (image 29). No discrete liver lesion. No intrahepatic biliary  ductal dilatation. Portal vein is patent on color Doppler imaging with normal direction of blood flow towards the liver. Other findings: Negative visible right kidney. IMPRESSION: 1. Appearance suspicious for Acute Cholecystitis: - cholelithiasis, including a stone in the neck of the gallbladder. - mild gallbladder wall thickening, - but no sonographic Murphy sign elicited. 2. No evidence of bile duct obstruction. 3. Fatty liver disease.  Electronically Signed   By: Genevie Ann M.D.   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 Heavily T2-weighted images of the biliary and pancreatic ducts were obtained, and three-dimensional MRCP images were rendered by post processing. CONTRAST:  6 mL Gadavist COMPARISON:  Ultrasound 05/22/2018 FINDINGS: Lower chest:  Lung bases are clear. Hepatobiliary: No intrahepatic biliary duct dilatation. No extrahepatic biliary duct dilatation. The common bile duct measures 5-6 mm which is within normal limits. No filling defect within the common bile duct. There is may be subtle focal thickening along the posterior wall of the common bile duct (image 23/3). This is not seen on all sequences. This is adjacent to the a pancreatic lesion described below. There is a large gallstone in the neck of the gallbladder measuring 16 mm. Large gallstone in the fundus of the gallbladder measuring 19 mm. There is mild pericholecystic fluid (image 21/3). No gallbladder distension of the gallbladder measuring 2.3 cm in diameter. Pancreas: Within head of the pancreas there is a rounded lesion measuring 2.7 by 2.2 cm which is hypointense on noncontrast T1 weighted imaging compared to the normal pancreatic parenchyma (image 66/18). This lesion has mixed signal intensity on T2 weighted imaging (image 25/3 and image 27/7). Lesion demonstrates minimal post-contrast enhancement (series 24). Apparent loss of signal  intensity on the fat-suppressed imaging suggest fat containing lesion. There is no dilatation of the pancreatic duct no pancreatic inflammation Spleen: Normal spleen.  Splenule at splenic hilum. Adrenals/urinary tract: Adrenal glands and kidneys are normal. Stomach/Bowel: Stomach and limited of the small bowel is unremarkable Vascular/Lymphatic: Abdominal aortic normal caliber. No retroperitoneal periportal lymphadenopathy. Musculoskeletal: No aggressive osseous lesion IMPRESSION: 1. Ovoid lesion in the head of the pancreas which appears to contain fat which typically indicates a benign process. Differential include lipoma in the head of the pancreas versus an atypical periampullary duodenum diverticulum. Other pancreatic neoplastic process cannot be completely excluded. No pancreatic duct dilatation. Pancreas was not imaged on limited comparison ultrasound. 2. No evidence of biliary obstruction. There is mild focal thickening along the posterior wall of the common bile duct through the head of the pancreas. No common bile duct dilatation. No intrahepatic duct dilatation 3. Two large gallstones in the gallbladder one towards the neck. Mild pericholecystic fluid. No gallbladder distension. These results will be called to the ordering clinician or representative by the Radiologist Assistant, and communication documented in the PACS or zVision Dashboard. Electronically Signed   By: Suzy Bouchard M.D.   On: 05/23/2018 08:01    Past Medical History:  Diagnosis Date  . Cancer ([place])    skin cancer  .  Diabetes mellitus without complication ([place])   . GERD (gastroesophageal reflux disease)   . Heart murmur   . Hyperlipidemia   . Hypertension   . OSA (obstructive sleep apnea) 08/03/2013   pt does not use CPAP  . Unspecified vitamin D deficiency      Allergies  Allergen Reactions  . Caduet [Amlodipine-Atorvastatin] Hives and Itching  . Statins     BLURRED VISION      Current Outpatient Medications on  File Prior to Visit  Medication Sig Dispense Refill  . aspirin 81 MG tablet Take 81 mg by mouth daily.    . blood glucose meter kit and supplies Test sugars once dailyDispense based insurance preference. E11.9 1 each 0  . citalopram (CELEXA) 40 MG tablet TAKE 1 TABLET BY MOUTH DAILY. (Patient taking differently: Take 40 mg by mouth daily. ) 90 tablet 1  . ezetimibe (ZETIA) 10 MG tablet TAKE 1 TABLET (10 MG TOTAL) BY MOUTH DAILY. 30 tablet 11  . gabapentin (NEURONTIN) 300 MG capsule TAKE 1 TO 3 CAPSULES BY MOUTH AT NIGHT FOR RESTLESS LEG (Patient taking differently: Take 600 mg by mouth at bedtime. ) 90 capsule 0  . losartan (COZAAR) 50 MG tablet TAKE 1 TABLET (50 MG TOTAL) BY MOUTH DAILY. 30 tablet 11  . Magnesium 100 MG CAPS Take 100 mg by mouth daily.     . Multiple Vitamins-Minerals (ZINC PO) Take 1 tablet by mouth daily.     Marland Kitchen omeprazole (PRILOSEC) 20 MG capsule TAKE 1 CAPSULE BY MOUTH ONCE DAILY (Patient taking differently: Take 20 mg by mouth daily. ) 90 capsule 1  . rosuvastatin (CRESTOR) 5 MG tablet Take 1 tablet (5 mg total) by mouth at bedtime. 90 tablet 1  . SYNJARDY XR 25-1000 MG TB24 TAKE 1 TABLET BY MOUTH ONCE DAILY (Patient taking differently: Take 1 tablet by mouth daily. ) 30 tablet 3  . traMADol (ULTRAM) 50 MG tablet Take 1-2 tablets (50-100 mg total) by mouth every 6 (six) hours as needed for moderate pain or severe pain. 20 tablet 0  . TURMERIC PO Take 1 tablet by mouth daily.      No current facility-administered medications on file prior to visit.     ROS: all negative except above.   Physical Exam: Filed Weights   05/27/18 1104  Weight: 230 lb 3.2 oz (104.4 kg)   BP 134/84   Pulse 76   Temp 97.8 F (36.6 C)   Ht 6' (1.829 m)   Wt 230 lb 3.2 oz (104.4 kg)   SpO2 96%   BMI 31.22 kg/m  General Appearance: Well nourished, in no apparent distress. Eyes: slightly yellowing bilateral eyes, PERRLA, EOMs, conjunctiva no swelling or erythema Sinuses: No  Frontal/maxillary tenderness ENT/Mouth: Ext aud canals clear, TMs without erythema, bulging. No erythema, swelling, or exudate on post pharynx.  Tonsils not swollen or erythematous. Hearing normal.  Neck: Supple, thyroid normal.  Respiratory: Respiratory effort normal, BS equal bilaterally without rales, rhonchi, wheezing or stridor.  Cardio: RRR with 1/6 systolic murmur no radiation.. Brisk peripheral pulses without edema.  Abdomen: Soft, + BS.  non tender with well healing laparoscopic scars, some healing ecchymosis around RUQ but healing well, no discharge, no warmth, no guarding, rebound, hernias, masses. Lymphatics: Non tender without lymphadenopathy.  Musculoskeletal: Full ROM, 5/5 strength, normal gait.  Skin: Warm, dry without rashes, lesions, ecchymosis.  Neuro: Cranial nerves intact. Normal muscle tone, no cerebellar symptoms.  Psych: Awake and oriented X 3, normal affect, Insight  and Judgment appropriate.    Vicie Mutters, PA-C 11:59 AM [facility]

## 2018-05-28 LAB — CBC WITH DIFFERENTIAL/PLATELET
Basophils Absolute: 40 cells/uL (ref 0–200)
Basophils Relative: 0.5 %
EOS ABS: 277 {cells}/uL (ref 15–500)
EOS PCT: 3.5 %
HEMATOCRIT: 42.5 % (ref 38.5–50.0)
HEMOGLOBIN: 14.5 g/dL (ref 13.2–17.1)
LYMPHS ABS: 3302 {cells}/uL (ref 850–3900)
MCH: 29.2 pg (ref 27.0–33.0)
MCHC: 34.1 g/dL (ref 32.0–36.0)
MCV: 85.5 fL (ref 80.0–100.0)
MPV: 10.2 fL (ref 7.5–12.5)
Monocytes Relative: 5.1 %
Neutro Abs: 3879 cells/uL (ref 1500–7800)
Neutrophils Relative %: 49.1 %
Platelets: 231 10*3/uL (ref 140–400)
RBC: 4.97 10*6/uL (ref 4.20–5.80)
RDW: 12.7 % (ref 11.0–15.0)
Total Lymphocyte: 41.8 %
WBC: 7.9 10*3/uL (ref 3.8–10.8)
WBCMIX: 403 {cells}/uL (ref 200–950)

## 2018-05-28 LAB — COMPLETE METABOLIC PANEL WITH GFR
AG Ratio: 1.6 (calc) (ref 1.0–2.5)
ALT: 100 U/L — AB (ref 9–46)
AST: 26 U/L (ref 10–35)
Albumin: 4.2 g/dL (ref 3.6–5.1)
Alkaline phosphatase (APISO): 104 U/L (ref 40–115)
BUN: 17 mg/dL (ref 7–25)
CALCIUM: 9.9 mg/dL (ref 8.6–10.3)
CO2: 27 mmol/L (ref 20–32)
CREATININE: 0.92 mg/dL (ref 0.70–1.25)
Chloride: 100 mmol/L (ref 98–110)
GFR, EST NON AFRICAN AMERICAN: 88 mL/min/{1.73_m2} (ref >=60)
GFR, Est African American: 102 mL/min/{1.73_m2} (ref >=60)
Globulin: 2.7 g/dL (calc) (ref 1.9–3.7)
Glucose, Bld: 116 mg/dL — ABNORMAL HIGH (ref 65–99)
Potassium: 4.5 mmol/L (ref 3.5–5.3)
Sodium: 139 mmol/L (ref 135–146)
TOTAL PROTEIN: 6.9 g/dL (ref 6.1–8.1)
Total Bilirubin: 1.1 mg/dL (ref 0.2–1.2)

## 2018-05-29 ENCOUNTER — Other Ambulatory Visit: Payer: Self-pay | Admitting: *Deleted

## 2018-05-29 NOTE — Patient Outreach (Signed)
[facility] [facility] [facility]) Care Management  05/29/2018  Glenn Stephens 1956-05-14 [id number]   Subjective: Telephone call to patient's home number, no answer, left HIPAA compliant voicemail message, and requested call back.     Objective: Per KPN (Knowledge Performance Now, point of care tool) and chart review, patient hospitalized 05/22/18 -05/24/18 for choledocholithiasis, status post Laparoscopic Cholecystectomy on 05/23/18.     Patient also has a history of restless leg syndrome, hypertension, hyperlipidemia, diabetes, jaundice secondary to choledocholithiasis, pancreatic lesion,  and systolic murmur.         Assessment: Received UMR Transition of care referral on 05/23/18.   Transition of care follow up pending patient contact.       Plan: RNCM will send unsuccessful outreach  letter, [facility] pamphlet, will call patient for 2nd telephone outreach attempt, transition of care follow up, and proceed with case closure, within 10 business days if no return call.       Ewell Benassi H. Annia Friendly, BSN, [place] Management [facility] Telephonic CM Phone: [phone] Fax: [phone]

## 2018-06-02 DIAGNOSIS — H5203 Hypermetropia, bilateral: Secondary | ICD-10-CM | POA: Diagnosis not present

## 2018-06-02 MED FILL — SYNJARDY XR 25-1000 MG TB24: 25-1000 | 30 days supply | Qty: 30 | Fill #1

## 2018-06-03 ENCOUNTER — Other Ambulatory Visit: Payer: Self-pay | Admitting: *Deleted

## 2018-06-03 NOTE — Patient Outreach (Signed)
[place] [facility]) Care Management  06/03/2018  Glenn Stephens Nov 08, 1955 [id number]   Subjective: Telephone call to patient's home number, no answer, left HIPAA compliant voicemail message, and requested call back.     Objective: Per KPN (Knowledge Performance Now, point of care tool) and chart review, patient hospitalized 05/22/18 -05/24/18 for choledocholithiasis, status post Laparoscopic Cholecystectomy on 05/23/18.     Patient also has a history of restless leg syndrome, hypertension, hyperlipidemia, diabetes, jaundice secondary to choledocholithiasis, pancreatic lesion,  and systolic murmur.         Assessment: Received UMR Transition of care referral on 05/23/18.   Transition of care follow up pending patient contact.       Plan: RNCM has sent unsuccessful outreach  letter, [facility] pamphlet, will call patient for 3rd telephone outreach attempt, transition of care follow up, and proceed with case closure, within 10 business days if no return call.      Glenn Stephens, BSN, [place] Management [facility] Telephonic CM Phone: [phone] Fax: [phone]

## 2018-06-09 ENCOUNTER — Other Ambulatory Visit: Payer: Self-pay | Admitting: *Deleted

## 2018-06-09 ENCOUNTER — Encounter: Payer: Self-pay | Admitting: *Deleted

## 2018-06-09 NOTE — Patient Outreach (Signed)
[place] [facility]) Care Management  06/09/2018  Glenn Stephens September 30, 1955 [id number]   Subjective: Telephone call from patient's home number, spoke with patient, and HIPAA verified.  Discussed [facility] Care Management UMR Transition of care follow up, patient voiced understanding, and is in agreement to follow up.  Patient states he is feeling much better, has a follow up appointment with surgeon today, and appointment went well.  States he has a follow up appointment with primary provider on 06/02/18, appointment went well, and liver enzymes are decreasing to baseline, and will follow up in January of 2020.  Patient aware of signs/ symptoms to report to provider as needed.   Patient states he is able to manage self care and has assistance as needed.  Patient voices understanding of medical diagnosis, surgery, and treatment plan.   Patient states per his provider he does not have sleep apnea.  States he is accessing the following [facility] benefits: outpatient pharmacy, hospital indemnity (not chosen), and has family medical leave act [employer]) in place.  Patient aware he may need follow with [employer] regarding FMLA leave dates and approval extension.  Patient states he does not have any education material, transition of care, care coordination, disease management, disease monitoring, transportation, community resource, or pharmacy needs at this time.  Patient states he is currently active with [facility] application.  States he is very appreciative of the follow up and is in agreement to receive [place] Management information.     Objective:Per KPN (Knowledge Performance Now, point of care tool) and chart review,patient hospitalized 05/22/18 -05/24/18 forcholedocholithiasis, status postLaparoscopic Cholecystectomyon 05/23/18. Patient also has a history of restless leg syndrome, hypertension, hyperlipidemia, diabetes, jaundice secondary to choledocholithiasis, pancreatic lesion, and systolic  murmur.       Assessment: Received UMR Transition of care referral on 05/23/18.Transition of care follow up completed, no care management needs, and will proceed with case closure.      Plan:RNCM will send patient successful outreach letter, [facility] pamphlet, and magnet. RNCM will complete case closure due to follow up completed / no care management needs.       Glenn Stephens, BSN, [place] Management [facility] Telephonic CM Phone: [phone] Fax: [phone]

## 2018-06-09 NOTE — Patient Outreach (Addendum)
[facility]) Care Management  06/09/2018  Glenn Stephens 1956-05-25 [id number]   Subjective: Received voicemail from Festus Barren, states he is returning call, and requested call back on home phone. Telephone call to patient's home  number, no answer, left HIPAA compliant voicemail message, and requested call back.     Objective:Per KPN (Knowledge Performance Now, point of care tool) and chart review,patient hospitalized 05/22/18 -05/24/18 forcholedocholithiasis, status postLaparoscopic Cholecystectomyon 05/23/18. Patient also has a history of restless leg syndrome, hypertension, hyperlipidemia, diabetes, jaundice secondary to choledocholithiasis, pancreatic lesion, and systolic murmur.       Assessment: Received UMR Transition of care referral on 05/23/18.Transition of care follow up pending patient contact.      Plan:RNCM has sent unsuccessful outreach letter, [facility] pamphlet, and will proceed with case closure, within 10 business days if no return call.      Kaileia Flow H. Annia Friendly, BSN, [place] Management [facility] Telephonic CM Phone: [phone] Fax: [phone]

## 2018-06-11 ENCOUNTER — Other Ambulatory Visit (INDEPENDENT_AMBULATORY_CARE_PROVIDER_SITE_OTHER): Payer: 59

## 2018-06-11 ENCOUNTER — Encounter: Payer: Self-pay | Admitting: Gastroenterology

## 2018-06-11 ENCOUNTER — Ambulatory Visit: Payer: 59 | Admitting: Gastroenterology

## 2018-06-11 ENCOUNTER — Ambulatory Visit (HOSPITAL_COMMUNITY)
Admission: RE | Admit: 2018-06-11 | Discharge: 2018-06-11 | Disposition: A | Discharge status: Home / Self Care | Payer: 59 | Source: Ambulatory Visit | Attending: Gastroenterology | Admitting: Gastroenterology

## 2018-06-11 VITALS — BP 118/60 | HR 68 | Ht 72.0 in | Wt 236.4 lb

## 2018-06-11 DIAGNOSIS — I808 Phlebitis and thrombophlebitis of other sites: Secondary | ICD-10-CM

## 2018-06-11 DIAGNOSIS — Z9049 Acquired absence of other specified parts of digestive tract: Secondary | ICD-10-CM

## 2018-06-11 DIAGNOSIS — Z860100 Personal history of colon polyps, unspecified: Secondary | ICD-10-CM

## 2018-06-11 DIAGNOSIS — R935 Abnormal findings on diagnostic imaging of other abdominal regions, including retroperitoneum: Secondary | ICD-10-CM

## 2018-06-11 DIAGNOSIS — K869 Disease of pancreas, unspecified: Secondary | ICD-10-CM | POA: Diagnosis not present

## 2018-06-11 LAB — HEPATIC FUNCTION PANEL
ALT: 54 U/L — ABNORMAL HIGH (ref 0–53)
AST: 40 U/L — ABNORMAL HIGH (ref 0–37)
Albumin: 4.5 g/dL (ref 3.5–5.2)
Alkaline Phosphatase: 61 U/L (ref 39–117)
Bilirubin, Direct: 0.2 mg/dL (ref 0.0–0.3)
Total Bilirubin: 1.1 mg/dL (ref 0.2–1.2)
Total Protein: 7.4 g/dL (ref 6.0–8.3)

## 2018-06-11 NOTE — Patient Instructions (Signed)
If you are age 63 or older, your body mass index should be between 23-30. Your Body mass index is 32.06 kg/m. If this is out of the aforementioned range listed, please consider follow up with your Primary Care Provider.  If you are age 76 or younger, your body mass index should be between 19-25. Your Body mass index is 32.06 kg/m. If this is out of the aformentioned range listed, please consider follow up with your Primary Care Provider.   Please go to the lab in the basement of our building to have lab work done as you leave today. Hit "B" for basement when you get on the elevator.  When the doors open the lab is on your left.  We will call you with the results. Thank you.  You are scheduled for an Upper Extremity Doppler Ultrasound of your left arm at [facility] today, 06-11-18 at 11:30am.  Please arrive at Admissions on the first floor by 11:15 today.  You will be contacted to schedule your EUS.  Thank you for entrusting me with your care, Dr. Justice Britain

## 2018-06-11 NOTE — Progress Notes (Signed)
Left upper extremity venous duplex has been completed. Negative for DVT. 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 Results were given to [facility] at Dr. Donneta Romberg office.  06/11/18 11:57 AM Glenn Stephens RVT

## 2018-06-11 NOTE — Progress Notes (Signed)
w  [place] VISIT   Primary Care Provider Unk Pinto, MD [address] [place] [address] [phone]  Referring Provider Unk Pinto, [place], [place] [address] [phone]  Patient Profile: Glenn Stephens is a 62 y.o. male with a pmh significant for GERD, diabetes, GERD, hyperlipidemia, hypertension, OSA, colon polyps (tubular adenoma), status post cholecystectomy.  The patient presents to the [facility] for an evaluation and management of problem(s) noted below:  Problem List 1. Superficial thrombophlebitis of left upper extremity   2. History of cholecystectomy   3. Lesion of pancreas   4. Abnormal MRI of abdomen   5. History of colonic polyps     History of Present Illness: This is the patient's first visit to outpatient [facility] clinic.  I met him for screening colonoscopy found tubular adenomas with plan for 5-year follow-up.  Within the next 36 hours the patient developed nausea without abdominal pain.  Subsequently he had the development of jaundice.  We asked him to be evaluated further and he came into the emergency department.  He was found to have concern for obstructive jaundice.  He underwent imaging that showed concern for cholecystitis.  Subsequently underwent an MRI/MRCP to rule out choledocholithiasis.  Choledocholithiasis was ruled out.  However a duodenal diverticulum versus a fatty pancreatic lesion was noted in the head of the pancreas.  The patient's was subsequently taken for a laparoscopic cholecystectomy.  He did well after his laparoscopic cholecystectomy and had been discharged home.  He has since followed up with his surgeon and has concern for hernia in the region of his scar site.  He has done exceedingly well over the course of the last few weeks however as well.  Cholecystitis after colonoscopy is not a known complication but.  With an interval  time.  Of an unclear etiology.  He describes never having any significant type of biliary colic before this.  The pathology showed chronic cholecystitis.  At this point in time since being discharged the patient still has some discomfort around the area of his incision with nerve like neuropathic pain at times.  He also noted on his left forearm with development of some swelling around a vein.  He has been putting some hot packs to the region.  This is occurred for the last 3 to 4 days.  Otherwise he is not having any changes in his bowel habits at this point in time and denies any melena or hematochezia.  He denies any family history of pancreatic issues.  He had never had jaundice before.    GI Review of Systems Positive as above including medically controlled GERD for years on PPI therapy Negative for dysphagia, odynophagia, nausea, vomiting, jaundice, change in appetite, abdominal distention, change in bowel habits  Review of Systems General: Denies fevers/chills/weight loss HEENT: Denies oral lesions Cardiovascular: Denies chest pain Pulmonary: Denies shortness of breath Gastroenterological: See HPI Genitourinary: Denies darkened urine Hematological: Denies easy bruising/bleeding Dermatological: Denies any jaundice since the patient's presentation to the hospital Psychological: Mood is stable   Medications Current Outpatient Medications  Medication Sig Dispense Refill  . aspirin 81 MG tablet Take 81 mg by mouth daily.    . blood glucose meter kit and supplies Test sugars once dailyDispense based insurance preference. E11.9 1 each 0  . Cholecalciferol 125 MCG (5000 UT) capsule Take 5,000 Units by mouth daily.    . citalopram (CELEXA) 40 MG tablet TAKE 1 TABLET  BY MOUTH DAILY. (Patient taking differently: Take 40 mg by mouth daily. ) 90 tablet 1  . ezetimibe (ZETIA) 10 MG tablet TAKE 1 TABLET (10 MG TOTAL) BY MOUTH DAILY. 30 tablet 11  . gabapentin (NEURONTIN) 300 MG capsule TAKE 1 TO 3  CAPSULES BY MOUTH AT NIGHT FOR RESTLESS LEG (Patient taking differently: Take 600 mg by mouth at bedtime. ) 90 capsule 0  . losartan (COZAAR) 50 MG tablet TAKE 1 TABLET (50 MG TOTAL) BY MOUTH DAILY. 30 tablet 11  . Magnesium 100 MG CAPS Take 100 mg by mouth daily.     Marland Kitchen omeprazole (PRILOSEC) 20 MG capsule TAKE 1 CAPSULE BY MOUTH ONCE DAILY (Patient taking differently: Take 20 mg by mouth daily. ) 90 capsule 1  . rosuvastatin (CRESTOR) 5 MG tablet Take 1 tablet (5 mg total) by mouth at bedtime. 90 tablet 1  . SYNJARDY XR 25-1000 MG TB24 TAKE 1 TABLET BY MOUTH ONCE DAILY (Patient taking differently: Take 1 tablet by mouth daily. ) 30 tablet 3  . TURMERIC PO Take 1 tablet by mouth daily.      No current facility-administered medications for this visit.     Allergies Allergies  Allergen Reactions  . Caduet [Amlodipine-Atorvastatin] Hives and Itching  . Statins     BLURRED VISION    Histories Past Medical History:  Diagnosis Date  . Cancer ([place])    skin cancer  . Diabetes mellitus without complication ([place])   . GERD (gastroesophageal reflux disease)   . Heart murmur   . Hyperlipidemia   . Hypertension   . OSA (obstructive sleep apnea) 08/03/2013   pt does not use CPAP  . Unspecified vitamin D deficiency    Past Surgical History:  Procedure Laterality Date  . CHOLECYSTECTOMY N/A 05/23/2018   Procedure: LAPAROSCOPIC CHOLECYSTECTOMY;  Surgeon: Clovis Riley, MD;  Location: [facility] OR;  Service: General;  Laterality: N/A;  . LASER ABLATION     veins  . METATARSAL OSTEOTOMY WITH BUNIONECTOMY    . orthopedic surgeries     knee,foot  . VASECTOMY     Social History   Socioeconomic History  . Marital status: Married    Spouse name: Not on file  . Number of children: Not on file  . Years of education: Not on file  . Highest education level: Not on file  Occupational History  . Not on file  Social Needs  . Financial resource strain: Not on file  . Food insecurity:    Worry:  Not on file    Inability: Not on file  . Transportation needs:    Medical: Not on file    Non-medical: Not on file  Tobacco Use  . Smoking status: Former Smoker    Last attempt to quit: 06/24/1993    Years since quitting: 25.0  . Smokeless tobacco: Never Used  Substance and Sexual Activity  . Alcohol use: No  . Drug use: No  . Sexual activity: Not on file  Lifestyle  . Physical activity:    Days per week: Not on file    Minutes per session: Not on file  . Stress: Not on file  Relationships  . Social connections:    Talks on phone: Not on file    Gets together: Not on file    Attends religious service: Not on file    Active member of club or organization: Not on file    Attends meetings of clubs or organizations: Not on file  Relationship status: Not on file  . Intimate partner violence:    Fear of current or ex partner: Not on file    Emotionally abused: Not on file    Physically abused: Not on file    Forced sexual activity: Not on file  Other Topics Concern  . Not on file  Social History Narrative  . Not on file   Family History  Problem Relation Age of Onset  . Cancer Mother        melanoma with mets .  multiple endocrine neoplasia, MEN 1, in sister as well  . Heart disease Father   . Hyperlipidemia Father   . Diabetes Sister        prothrombin gene mutations, hypercoagulable disorder in sister and her kids.    . Hypertension Brother   . Stomach cancer Maternal Grandfather   . Colon cancer Neg Hx   . Colon polyps Neg Hx   . Esophageal cancer Neg Hx   . Rectal cancer Neg Hx   . Inflammatory bowel disease Neg Hx   . Liver disease Neg Hx   . Pancreatic cancer Neg Hx    I have reviewed his medical, social, and family history in detail and updated the electronic medical record as necessary.    PHYSICAL EXAMINATION  BP 118/60 (BP Location: Right Arm, Patient Position: Sitting, Cuff Size: Normal)   Pulse 68   Ht 6' (1.829 m)   Wt 236 lb 6 oz (107.2 kg)    BMI 32.06 kg/m  Wt Readings from Last 3 Encounters:  06/17/18 238 lb (108 kg)  06/11/18 236 lb 6 oz (107.2 kg)  05/27/18 230 lb 3.2 oz (104.4 kg)  GEN: NAD, appears stated age, doesn't appear chronically ill, accompanied by wife PSYCH: Cooperative, without pressured speech EYE: Conjunctivae pink, sclerae anicteric ENT: MMM, without oral ulcers, no erythema or exudates noted CV: RR without R/Gs  RESP: CTAB posteriorly, without wheezing GI: NABS, soft, surgical scars present and healing, tenderness to palpation in the right upper quadrant region upon light palpation of the skin, a small incisional hernia is present, ND, without rebound or guarding, no HSM appreciated MSK/EXT: No lower extremity edema SKIN: No jaundice NEURO:  Alert & Oriented x 3, no focal deficits   REVIEW OF DATA  I reviewed the following data at the time of this encounter:  GI Procedures and Studies  November 2019 colonoscopy - Preparation of the colon was fair. - Non-thrombosed internal hemorrhoids found on digital rectal exam. - The examined portion of the ileum was normal. - One 4 mm polyp in the transverse colon, removed with a cold snare. Resected and retrieved. - Diverticulosis in the sigmoid colon. - Normal mucosa in the entire examined colon otherwise. - Non-bleeding non-thrombosed internal hemorrhoids.  Laboratory Studies  Reviewed in epic  Imaging Studies  November 2019 MRI/MRCP IMPRESSION: 1. Ovoid lesion in the head of the pancreas which appears to contain fat which typically indicates a benign process. Differential include lipoma in the head of the pancreas versus an atypical periampullary duodenum diverticulum. Other pancreatic neoplastic process cannot be completely excluded. No pancreatic duct dilatation. Pancreas was not imaged on limited comparison ultrasound. 2. No evidence of biliary obstruction. There is mild focal thickening along the posterior wall of the common bile duct  through the head of the pancreas. No common bile duct dilatation. No intrahepatic duct dilatation 3. Two large gallstones in the gallbladder one towards the neck. Mild pericholecystic fluid. No gallbladder distension.  November  2019 ultrasound abdomen IMPRESSION: 1. Appearance suspicious for Acute Cholecystitis: - cholelithiasis, including a stone in the neck of the gallbladder. - mild gallbladder wall thickening, - but no sonographic Murphy sign elicited. 2. No evidence of bile duct obstruction. 3. Fatty liver disease.   ASSESSMENT  Mr. Garraway is a 62 y.o. male with a pmh significant for GERD, diabetes, GERD, hyperlipidemia, hypertension, OSA, colon polyps (tubular adenoma), status post cholecystectomy.  The patient is seen today for evaluation and management of:  1. Superficial thrombophlebitis of left upper extremity   2. History of cholecystectomy   3. Lesion of pancreas   4. Abnormal MRI of abdomen   5. History of colonic polyps    The patient is hemodynamically and clinically stable at this point in time.  He underwent a successful cholecystectomy with findings of chronic cholecystitis.  His liver biochemical tests improved prior to his discharge.  In the setting of his work-up he was found to have a possible pancreatic lesion versus duodenal diverticulum on MRI/MRCP imaging.  This looks like it is probably a benign fatty infiltrate in the head of the pancreas however we should be very thoughtful and careful sure that nothing else is missed.  As such would like to proceed with an upper endoscopy with EUS to evaluate and possibly sample the head of the pancreas if something is found.  With that being said I think the likelihood of finding a significant adenocarcinoma is very low but never 0 so we should proceed as able with ultrasound endoscopy.  The risks of EUS including bleeding, infection, aspiration pneumonia and intestinal perforation were discussed as was the possibility it may  not give a definitive diagnosis.  If a biopsy of the pancreas is done as part of the EUS, there is an additional risk of pancreatitis at the rate of about 1%.  It was explained that procedure related pancreatitis is typically mild, although can be severe and even life threatening, which is why we do not perform random pancreatic biopsies and only biopsy a lesion we feel is concerning enough to warrant the risk.  We will plan to repeat his liver biochemical test today and follow them hopefully to normalcy.  There is been concern for some fatty liver on his imaging.  No plan for liver biopsy currently.  We will obtain some basic serologies to rule out his viral hepatitis.  All patient questions were answered, to the best of my ability, and the patient agrees to the aforementioned plan of action with follow-up as indicated.   PLAN  Labs as outlined below Plan for EGD/EUS next available to evaluate for pancreatic lesion with possible FNB No plan for liver biopsy currently Patient continues to have issues surrounding his little hernia he will need to reach out to his surgeon to discuss if any further management is necessary Colon polyp surveillance in 5 years Continue PPI at current dose as it controls his symptoms and he will have an endoscopy to evaluate the esophagus showed either dosing be necessary  Orders Placed This Encounter  Procedures  . Hepatitis A antibody, total  . Hepatitis B Core Antibody, total  . Hepatitis B Surface AntiBODY  . Hepatic function panel    New Prescriptions   No medications on file   Modified Medications   No medications on file    Planned Follow Up: No follow-ups on file.   Justice Britain, MD [facility] Office # [phone]

## 2018-06-12 ENCOUNTER — Other Ambulatory Visit: Payer: Self-pay

## 2018-06-12 ENCOUNTER — Ambulatory Visit (HOSPITAL_COMMUNITY): Payer: 59 | Attending: Cardiovascular Disease

## 2018-06-12 DIAGNOSIS — R011 Cardiac murmur, unspecified: Secondary | ICD-10-CM | POA: Diagnosis not present

## 2018-06-12 LAB — HEPATITIS B CORE ANTIBODY, TOTAL: Hep B Core Total Ab: NONREACTIVE

## 2018-06-12 LAB — HEPATITIS A ANTIBODY, TOTAL: Hepatitis A AB,Total: REACTIVE — AB

## 2018-06-12 LAB — HEPATITIS B SURFACE ANTIBODY,QUALITATIVE: HEP B S AB: REACTIVE — AB

## 2018-06-13 ENCOUNTER — Other Ambulatory Visit (HOSPITAL_COMMUNITY): Payer: 59

## 2018-06-13 ENCOUNTER — Other Ambulatory Visit: Payer: Self-pay | Admitting: Gastroenterology

## 2018-06-13 ENCOUNTER — Other Ambulatory Visit: Payer: Self-pay

## 2018-06-13 DIAGNOSIS — R935 Abnormal findings on diagnostic imaging of other abdominal regions, including retroperitoneum: Secondary | ICD-10-CM

## 2018-06-13 DIAGNOSIS — R945 Abnormal results of liver function studies: Principal | ICD-10-CM

## 2018-06-13 DIAGNOSIS — R7989 Other specified abnormal findings of blood chemistry: Secondary | ICD-10-CM

## 2018-06-13 NOTE — Progress Notes (Signed)
hep

## 2018-06-17 ENCOUNTER — Ambulatory Visit: Payer: 59 | Admitting: Physician Assistant

## 2018-06-17 ENCOUNTER — Encounter: Payer: Self-pay | Admitting: Gastroenterology

## 2018-06-17 ENCOUNTER — Encounter: Payer: Self-pay | Admitting: Physician Assistant

## 2018-06-17 VITALS — BP 140/68 | HR 61 | Temp 97.4°F | Ht 72.0 in | Wt 238.0 lb

## 2018-06-17 DIAGNOSIS — I776 Arteritis, unspecified: Secondary | ICD-10-CM

## 2018-06-17 DIAGNOSIS — R945 Abnormal results of liver function studies: Secondary | ICD-10-CM | POA: Diagnosis not present

## 2018-06-17 DIAGNOSIS — K8689 Other specified diseases of pancreas: Secondary | ICD-10-CM | POA: Diagnosis not present

## 2018-06-17 DIAGNOSIS — R7989 Other specified abnormal findings of blood chemistry: Secondary | ICD-10-CM

## 2018-06-17 MED FILL — LOSARTAN POTASSIUM 50 MG TA: 50 | 90 days supply | Qty: 90 | Fill #2

## 2018-06-17 NOTE — Progress Notes (Signed)
   Subjective:    Patient ID: Glenn Stephens, male    DOB: 27-Dec-1955, 62 y.o.   MRN: [id number]  HPI 62 y.o. WM presents for questions.   He had LUE swelling and tenderness, had VAS [place] 06/11/2018 that showed ? Right sided basilic SUPERFICAL thrombophlebititis.   ALT continues to come down, albumin is back to normal. AST is up from last time, may stop crestor IF still elevated at this time.  Lab Results  Component Value Date   ALT 54 (H) 06/11/2018   AST 40 (H) 06/11/2018   ALKPHOS 61 06/11/2018   BILITOT 1.1 06/11/2018   Echo shows EF 55% and minimal aortic sclerosis.   Review of Systems  Constitutional: Negative.   HENT: Negative.   Respiratory: Negative.   Cardiovascular: Negative.   Gastrointestinal: Negative.   Genitourinary: Negative.   Musculoskeletal: Negative.   Skin: Negative.   Neurological: Negative.   Psychiatric/Behavioral: Negative.        Objective:   Physical Exam General Appearance: Well nourished, in no apparent distress. Eyes: slightly yellowing bilateral eyes, PERRLA, EOMs, conjunctiva no swelling or erythema Sinuses: No Frontal/maxillary tenderness ENT/Mouth: Ext aud canals clear, TMs without erythema, bulging. No erythema, swelling, or exudate on post pharynx.  Tonsils not swollen or erythematous. Hearing normal.  Neck: Supple, thyroid normal.  Respiratory: Respiratory effort normal, BS equal bilaterally without rales, rhonchi, wheezing or stridor.  Cardio: RRR with 1/6 systolic murmur no radiation.. Brisk peripheral pulses without edema.  Abdomen: Soft, + BS.  non tender with well healing laparoscopic scars, linea laba seperation 2 fingers, no guarding, rebound, hernias, masses. Lymphatics: Non tender without lymphadenopathy.  Musculoskeletal: Full ROM, 5/5 strength, normal gait.  Skin: Warm, dry without rashes, lesions, ecchymosis.  Neuro: Cranial nerves intact. Normal muscle tone, no cerebellar symptoms.  Psych: Awake and oriented X 3, normal  affect, Insight and Judgment appropriate.       Assessment & Plan:   Vasculitis ([place]) ? superfical thrombophlebitis, patient and wife reassured that it is superficial not deep Personally think it was left side and not right and correlated with blood draw/IV area however will get autoimmune work up for vasculitis -     Anti-DNA antibody, double-stranded -     Cyclic citrul peptide antibody, IgG -     C-reactive protein -     ANCA screen with reflex titer -     Anti-Smith antibody -     RNP Antibody  Elevated LFTs ? Need to stop crestor for a while if still elevated, off tylenol.  -     Anti-DNA antibody, double-stranded -     Cyclic citrul peptide antibody, IgG -     C-reactive protein -     ANCA screen with reflex titer -     Anti-Smith antibody -     RNP Antibody  Mass of head pancreas (fatty) Getting biopsy, reassured again  Hernia Actually more of an AB separation, patient reassured as well.      Future Appointments  Date Time Provider [place]  08/04/2018  9:30 AM Vicie Mutters, PA-C [facility] None  04/28/2019  9:00 AM Vicie Mutters, PA-C [facility] None

## 2018-06-18 DIAGNOSIS — K869 Disease of pancreas, unspecified: Secondary | ICD-10-CM | POA: Insufficient documentation

## 2018-06-18 DIAGNOSIS — R935 Abnormal findings on diagnostic imaging of other abdominal regions, including retroperitoneum: Secondary | ICD-10-CM | POA: Insufficient documentation

## 2018-06-18 DIAGNOSIS — Z860100 Personal history of colon polyps, unspecified: Secondary | ICD-10-CM | POA: Insufficient documentation

## 2018-06-18 DIAGNOSIS — Z9049 Acquired absence of other specified parts of digestive tract: Secondary | ICD-10-CM | POA: Insufficient documentation

## 2018-06-18 DIAGNOSIS — I808 Phlebitis and thrombophlebitis of other sites: Secondary | ICD-10-CM

## 2018-06-18 HISTORY — DX: Phlebitis and thrombophlebitis of other sites: I80.8

## 2018-06-18 LAB — RNP ANTIBODY: Ribonucleic Protein(ENA) Antibody, IgG: <1 AI

## 2018-06-18 LAB — ANCA SCREEN W REFLEX TITER: ANCA Screen: NEGATIVE

## 2018-06-18 LAB — ANTI-SMITH ANTIBODY: ENA SM Ab Ser-aCnc: <1 AI

## 2018-06-18 LAB — CYCLIC CITRUL PEPTIDE ANTIBODY, IGG: Cyclic Citrullin Peptide Ab: <16 UNITS

## 2018-06-18 LAB — C-REACTIVE PROTEIN: CRP: 4.4 mg/L (ref <8.0)

## 2018-06-18 LAB — ANTI-DNA ANTIBODY, DOUBLE-STRANDED: ds DNA Ab: 1 IU/mL

## 2018-06-19 MED FILL — EZETIMIBE 10 MG TABS: 10 | 90 days supply | Qty: 90 | Fill #2

## 2018-06-30 ENCOUNTER — Encounter: Payer: Self-pay | Admitting: Internal Medicine

## 2018-07-03 ENCOUNTER — Other Ambulatory Visit: Payer: Self-pay | Admitting: Physician Assistant

## 2018-07-03 MED FILL — GABAPENTIN 300 MG CAPSULE: 300 | 30 days supply | Qty: 90 | Fill #0

## 2018-07-03 MED FILL — SYNJARDY XR 25-1000 MG TB24: 25-1000 | 30 days supply | Qty: 30 | Fill #2

## 2018-07-22 ENCOUNTER — Encounter (HOSPITAL_COMMUNITY): Payer: Self-pay | Admitting: *Deleted

## 2018-07-22 ENCOUNTER — Other Ambulatory Visit: Payer: Self-pay

## 2018-07-22 NOTE — Progress Notes (Addendum)
Pt denies SOB and chest pain but is under the care of Dr. Daneen Schick, Cardiology. Pt stated that he was not instructed to stop taking Aspirin. Pt made aware to stop taking vitamins, fish oil, Turmeric and herbal medications. Do not take any NSAIDs ie: Ibuprofen, Advil, Naproxen (Aleve), Motrin, BC and Goody Powder. Pt stated that he was not instructed to hold Synjardy the day before procedure-pt already took today's  dose. Pt made aware- do not take Synjardy the morning of procedure. Pt made aware to check BG every 2 hours prior to arrival to hospital on DOS. Pt made aware to treat a BG < 70 with 4 ounces of apple juice, wait 15 minutes after intervention to recheck BG, if BG remains < 70, call Endoscopy unit to speak with a nurse. Pt verbalized understanding of all pre-op instructions

## 2018-07-23 ENCOUNTER — Ambulatory Visit (HOSPITAL_COMMUNITY): Payer: 59 | Admitting: Anesthesiology

## 2018-07-23 ENCOUNTER — Telehealth: Payer: Self-pay | Admitting: Gastroenterology

## 2018-07-23 ENCOUNTER — Encounter (HOSPITAL_COMMUNITY)
Admission: RE | Disposition: A | Discharge status: Home / Self Care | Payer: Self-pay | Source: Home / Self Care | Attending: Gastroenterology

## 2018-07-23 ENCOUNTER — Other Ambulatory Visit (INDEPENDENT_AMBULATORY_CARE_PROVIDER_SITE_OTHER): Payer: 59

## 2018-07-23 ENCOUNTER — Ambulatory Visit (HOSPITAL_COMMUNITY)
Admission: RE | Admit: 2018-07-23 | Discharge: 2018-07-23 | Disposition: A | Discharge status: Home / Self Care | Payer: 59 | Attending: Gastroenterology | Admitting: Gastroenterology

## 2018-07-23 ENCOUNTER — Encounter (HOSPITAL_COMMUNITY): Payer: Self-pay | Admitting: *Deleted

## 2018-07-23 DIAGNOSIS — I899 Noninfective disorder of lymphatic vessels and lymph nodes, unspecified: Secondary | ICD-10-CM | POA: Diagnosis not present

## 2018-07-23 DIAGNOSIS — K869 Disease of pancreas, unspecified: Secondary | ICD-10-CM | POA: Diagnosis not present

## 2018-07-23 DIAGNOSIS — K8689 Other specified diseases of pancreas: Secondary | ICD-10-CM | POA: Diagnosis not present

## 2018-07-23 DIAGNOSIS — E559 Vitamin D deficiency, unspecified: Secondary | ICD-10-CM | POA: Diagnosis not present

## 2018-07-23 DIAGNOSIS — Z87891 Personal history of nicotine dependence: Secondary | ICD-10-CM | POA: Diagnosis not present

## 2018-07-23 DIAGNOSIS — K449 Diaphragmatic hernia without obstruction or gangrene: Secondary | ICD-10-CM | POA: Insufficient documentation

## 2018-07-23 DIAGNOSIS — E785 Hyperlipidemia, unspecified: Secondary | ICD-10-CM | POA: Insufficient documentation

## 2018-07-23 DIAGNOSIS — I10 Essential (primary) hypertension: Secondary | ICD-10-CM | POA: Diagnosis not present

## 2018-07-23 DIAGNOSIS — K222 Esophageal obstruction: Secondary | ICD-10-CM | POA: Insufficient documentation

## 2018-07-23 DIAGNOSIS — F419 Anxiety disorder, unspecified: Secondary | ICD-10-CM | POA: Insufficient documentation

## 2018-07-23 DIAGNOSIS — K2980 Duodenitis without bleeding: Secondary | ICD-10-CM | POA: Diagnosis not present

## 2018-07-23 DIAGNOSIS — R945 Abnormal results of liver function studies: Secondary | ICD-10-CM | POA: Diagnosis not present

## 2018-07-23 DIAGNOSIS — Z85828 Personal history of other malignant neoplasm of skin: Secondary | ICD-10-CM | POA: Diagnosis not present

## 2018-07-23 DIAGNOSIS — K5710 Diverticulosis of small intestine without perforation or abscess without bleeding: Secondary | ICD-10-CM | POA: Diagnosis not present

## 2018-07-23 DIAGNOSIS — K219 Gastro-esophageal reflux disease without esophagitis: Secondary | ICD-10-CM | POA: Insufficient documentation

## 2018-07-23 DIAGNOSIS — K317 Polyp of stomach and duodenum: Secondary | ICD-10-CM

## 2018-07-23 DIAGNOSIS — Z79899 Other long term (current) drug therapy: Secondary | ICD-10-CM | POA: Diagnosis not present

## 2018-07-23 DIAGNOSIS — K3189 Other diseases of stomach and duodenum: Secondary | ICD-10-CM | POA: Diagnosis not present

## 2018-07-23 DIAGNOSIS — E119 Type 2 diabetes mellitus without complications: Secondary | ICD-10-CM | POA: Insufficient documentation

## 2018-07-23 DIAGNOSIS — K8050 Calculus of bile duct without cholangitis or cholecystitis without obstruction: Secondary | ICD-10-CM | POA: Diagnosis not present

## 2018-07-23 DIAGNOSIS — Z888 Allergy status to other drugs, medicaments and biological substances status: Secondary | ICD-10-CM | POA: Insufficient documentation

## 2018-07-23 DIAGNOSIS — K2950 Unspecified chronic gastritis without bleeding: Secondary | ICD-10-CM | POA: Diagnosis not present

## 2018-07-23 DIAGNOSIS — Z7984 Long term (current) use of oral hypoglycemic drugs: Secondary | ICD-10-CM | POA: Insufficient documentation

## 2018-07-23 DIAGNOSIS — G4733 Obstructive sleep apnea (adult) (pediatric): Secondary | ICD-10-CM | POA: Diagnosis not present

## 2018-07-23 DIAGNOSIS — Z7982 Long term (current) use of aspirin: Secondary | ICD-10-CM | POA: Insufficient documentation

## 2018-07-23 DIAGNOSIS — R1013 Epigastric pain: Secondary | ICD-10-CM | POA: Diagnosis present

## 2018-07-23 DIAGNOSIS — R7989 Other specified abnormal findings of blood chemistry: Secondary | ICD-10-CM

## 2018-07-23 HISTORY — DX: Complete loss of teeth, unspecified cause, unspecified class: K08.109

## 2018-07-23 HISTORY — PX: BIOPSY: SHX5522

## 2018-07-23 HISTORY — DX: Pneumonia, unspecified organism: J18.9

## 2018-07-23 HISTORY — PX: UPPER ESOPHAGEAL ENDOSCOPIC ULTRASOUND (EUS): SHX6562

## 2018-07-23 HISTORY — DX: Complete loss of teeth, unspecified cause, unspecified class: Z97.2

## 2018-07-23 HISTORY — PX: ESOPHAGOGASTRODUODENOSCOPY (EGD) WITH PROPOFOL: SHX5813

## 2018-07-23 HISTORY — DX: Unspecified abdominal hernia without obstruction or gangrene: K46.9

## 2018-07-23 HISTORY — DX: Presence of spectacles and contact lenses: Z97.3

## 2018-07-23 LAB — HEPATIC FUNCTION PANEL
ALT: 34 U/L (ref 0–53)
AST: 25 U/L (ref 0–37)
Albumin: 4 g/dL (ref 3.5–5.2)
Alkaline Phosphatase: 47 U/L (ref 39–117)
Bilirubin, Direct: 0.2 mg/dL (ref 0.0–0.3)
TOTAL PROTEIN: 6.7 g/dL (ref 6.0–8.3)
Total Bilirubin: 0.6 mg/dL (ref 0.2–1.2)

## 2018-07-23 LAB — GLUCOSE, CAPILLARY: Glucose-Capillary: 159 mg/dL — ABNORMAL HIGH (ref 70–99)

## 2018-07-23 SURGERY — UPPER ENDOSCOPIC ULTRASOUND (EUS) LINEAR
Anesthesia: Monitor Anesthesia Care

## 2018-07-23 SURGERY — UPPER ESOPHAGEAL ENDOSCOPIC ULTRASOUND (EUS)
Anesthesia: Monitor Anesthesia Care

## 2018-07-23 MED ORDER — LACTATED RINGERS IV SOLN
INTRAVENOUS | Status: DC
  Administered 2018-07-23: 1000 mL via INTRAVENOUS

## 2018-07-23 MED ORDER — PROPOFOL 500 MG/50ML IV EMUL
INTRAVENOUS | Status: DC | PRN
  Administered 2018-07-23: 100 ug/kg/min via INTRAVENOUS

## 2018-07-23 MED ORDER — SODIUM CHLORIDE 0.9 % IV SOLN: INTRAVENOUS | Status: DC

## 2018-07-23 NOTE — Discharge Instructions (Signed)
Monitored Anesthesia Care, Care After These instructions provide you with information about caring for yourself after your procedure. Your health care provider may also give you more specific instructions. Your treatment has been planned according to current medical practices, but problems sometimes occur. Call your health care provider if you have any problems or questions after your procedure. What can I expect after the procedure? After your procedure, you may:  Feel sleepy for several hours.  Feel clumsy and have poor balance for several hours.  Feel forgetful about what happened after the procedure.  Have poor judgment for several hours.  Feel nauseous or vomit.  Have a sore throat if you had a breathing tube during the procedure. Follow these instructions at home: For at least 24 hours after the procedure:      Have a responsible adult stay with you. It is important to have someone help care for you until you are awake and alert.  Rest as needed.  Do not: ? Participate in activities in which you could fall or become injured. ? Drive. ? Use heavy machinery. ? Drink alcohol. ? Take sleeping pills or medicines that cause drowsiness. ? Make important decisions or sign legal documents. ? Take care of children on your own. Eating and drinking  Follow the diet that is recommended by your health care provider.  If you vomit, drink water, juice, or soup when you can drink without vomiting.  Make sure you have little or no nausea before eating solid foods. General instructions  Take over-the-counter and prescription medicines only as told by your health care provider.  If you have sleep apnea, surgery and certain medicines can increase your risk for breathing problems. Follow instructions from your health care provider about wearing your sleep device: ? Anytime you are sleeping, including during daytime naps. ? While taking prescription pain medicines, sleeping medicines,  or medicines that make you drowsy.  If you smoke, do not smoke without supervision.  Keep all follow-up visits as told by your health care provider. This is important. Contact a health care provider if:  You keep feeling nauseous or you keep vomiting.  You feel light-headed.  You develop a rash.  You have a fever. Get help right away if:  You have trouble breathing. Summary  For several hours after your procedure, you may feel sleepy and have poor judgment.  Have a responsible adult stay with you for at least 24 hours or until you are awake and alert. This information is not intended to replace advice given to you by your health care provider. Make sure you discuss any questions you have with your health care provider. Document Released: 10/16/2015 Document Revised: 02/08/2017 Document Reviewed: 10/16/2015 Elsevier Interactive Patient Education  2019 [place] HAD AN ENDOSCOPIC PROCEDURE TODAY: Refer to the procedure report and other information in the discharge instructions given to you for any specific questions about what was found during the examination. If this information does not answer your questions, please call [facility] at [phone] to clarify.   YOU SHOULD EXPECT: Some feelings of bloating in the abdomen. Passage of more gas than usual. Walking can help get rid of the air that was put into your GI tract during the procedure and reduce the bloating. If you had a lower endoscopy (such as a colonoscopy or flexible sigmoidoscopy) you may notice spotting of blood in your stool or on the toilet paper. Some abdominal soreness may be present for a day or two, also.  DIET: Your first meal following the procedure should be a light meal and then it is ok to progress to your normal diet. A half-sandwich or bowl of soup is an example of a good first meal. Heavy or fried foods are harder to digest and may make you feel nauseous or bloated. Drink plenty of fluids but  you should avoid alcoholic beverages for 24 hours. If you had an esophageal dilation, please see attached information for diet.   ACTIVITY: Your care partner should take you home directly after the procedure. You should plan to take it easy, moving slowly for the rest of the day. You can resume normal activity the day after the procedure however YOU SHOULD NOT DRIVE, use power tools, machinery or perform tasks that involve climbing or major physical exertion for 24 hours (because of the sedation medicines used during the test).   SYMPTOMS TO REPORT IMMEDIATELY: A gastroenterologist can be reached at any hour. Please call [phone]  for any of the following symptoms:  Following lower endoscopy (colonoscopy, flexible sigmoidoscopy) Excessive amounts of blood in the stool  Significant tenderness, worsening of abdominal pains  Swelling of the abdomen that is new, acute  Fever of 100 or higher  Following upper endoscopy (EGD, EUS, ERCP, esophageal dilation) Vomiting of blood or coffee ground material  New, significant abdominal pain  New, significant chest pain or pain under the shoulder blades  Painful or persistently difficult swallowing  New shortness of breath  Black, tarry-looking or red, bloody stools  FOLLOW UP:  If any biopsies were taken you will be contacted by phone or by letter within the next 1-3 weeks. Call [phone]  if you have not heard about the biopsies in 3 weeks.  Please also call with any specific questions about appointments or follow up tests.

## 2018-07-23 NOTE — H&P (View-Only) (Signed)
GASTROENTEROLOGY OUTPATIENT PROCEDURE H&P NOTE   Primary Care Physician: Unk Pinto, MD  HPI: Glenn Stephens is a 63 y.o. male who presents for EGD/EUS with possible FNB.  Past Medical History:  Diagnosis Date  . Cancer ([place])    skin cancer  . Diabetes mellitus without complication ([place])   . Full dentures   . GERD (gastroesophageal reflux disease)   . Heart murmur   . Hernia, abdominal   . Hyperlipidemia   . Hypertension   . OSA (obstructive sleep apnea) 08/03/2013   pt does not use CPAP  . Pneumonia    as a small child only  . Unspecified vitamin D deficiency   . Wears glasses    Past Surgical History:  Procedure Laterality Date  . CHOLECYSTECTOMY N/A 05/23/2018   Procedure: LAPAROSCOPIC CHOLECYSTECTOMY;  Surgeon: Clovis Riley, MD;  Location: [facility] OR;  Service: General;  Laterality: N/A;  . LASER ABLATION     veins  . METATARSAL OSTEOTOMY WITH BUNIONECTOMY    . MULTIPLE TOOTH EXTRACTIONS    . orthopedic surgeries     knee,foot  . VASECTOMY     No current facility-administered medications for this encounter.    Allergies  Allergen Reactions  . Caduet [Amlodipine-Atorvastatin] Hives and Itching  . Statins     BLURRED VISION   Family History  Problem Relation Age of Onset  . Cancer Mother        melanoma with mets .  multiple endocrine neoplasia, MEN 1, in sister as well  . Heart disease Father   . Hyperlipidemia Father   . Diabetes Sister        prothrombin gene mutations, hypercoagulable disorder in sister and her kids.    . Hypertension Brother   . Stomach cancer Maternal Grandfather   . Colon cancer Neg Hx   . Colon polyps Neg Hx   . Esophageal cancer Neg Hx   . Rectal cancer Neg Hx   . Inflammatory bowel disease Neg Hx   . Liver disease Neg Hx   . Pancreatic cancer Neg Hx    Social History   Socioeconomic History  . Marital status: Married    Spouse name: Not on file  . Number of children: Not on file  . Years of education: Not on  file  . Highest education level: Not on file  Occupational History  . Not on file  Social Needs  . Financial resource strain: Not on file  . Food insecurity:    Worry: Not on file    Inability: Not on file  . Transportation needs:    Medical: Not on file    Non-medical: Not on file  Tobacco Use  . Smoking status: Former Smoker    Last attempt to quit: 06/24/1993    Years since quitting: 25.0  . Smokeless tobacco: Former Systems developer    Types: Chew  Substance and Sexual Activity  . Alcohol use: No  . Drug use: No  . Sexual activity: Not on file  Lifestyle  . Physical activity:    Days per week: Not on file    Minutes per session: Not on file  . Stress: Not on file  Relationships  . Social connections:    Talks on phone: Not on file    Gets together: Not on file    Attends religious service: Not on file    Active member of club or organization: Not on file    Attends meetings of clubs or organizations: Not  on file    Relationship status: Not on file  . Intimate partner violence:    Fear of current or ex partner: Not on file    Emotionally abused: Not on file    Physically abused: Not on file    Forced sexual activity: Not on file  Other Topics Concern  . Not on file  Social History Narrative  . Not on file    Physical Exam: Vital signs in last 24 hours: Temp:  [98 F (36.7 C)] 98 F (36.7 C) (01/15 0814) Resp:  [12] 12 (01/15 0814) BP: (158)/(82) 158/82 (01/15 0814) SpO2:  [99 %] 99 % (01/15 0814) Weight:  [109.8 kg] 109.8 kg (01/15 0814)   GEN: NAD EYE: Sclerae anicteric ENT: MMM CV: RR without R/Gs  RESP: CTAB posteriorly GI: Soft, NT/ND NEURO:  Alert & Oriented x 3  Lab Results: No results for input(s): WBC, HGB, HCT, PLT in the last 72 hours. BMET No results for input(s): NA, K, CL, CO2, GLUCOSE, BUN, CREATININE, CALCIUM in the last 72 hours. LFT No results for input(s): PROT, ALBUMIN, AST, ALT, ALKPHOS, BILITOT, BILIDIR, IBILI in the last 72  hours. PT/INR No results for input(s): LABPROT, INR in the last 72 hours.   Impression / Plan: This is a 63 y.o.male who presents for for EGD/EUS with possible FNB.  The risks and benefits of endoscopic evaluation were discussed with the patient; these include but are not limited to the risk of perforation, infection, bleeding, missed lesions, lack of diagnosis, severe illness requiring hospitalization, as well as anesthesia and sedation related illnesses.  The patient is agreeable to proceed.    Justice Britain, MD [facility] Office # [phone]

## 2018-07-23 NOTE — Transfer of Care (Signed)
Immediate Anesthesia Transfer of Care Note  Patient: Glenn Stephens  Procedure(s) Performed: UPPER ESOPHAGEAL ENDOSCOPIC ULTRASOUND (EUS) (N/A ) BIOPSY ESOPHAGOGASTRODUODENOSCOPY (EGD) WITH PROPOFOL (N/A )  Patient Location: Endoscopy Unit  Anesthesia Type:MAC  Level of Consciousness: awake, alert  and oriented  Airway & Oxygen Therapy: Patient Spontanous Breathing and Patient connected to nasal cannula oxygen  Post-op Assessment: Report given to RN and Post -op Vital signs reviewed and stable  Post vital signs: Reviewed and stable  Last Vitals:  Vitals Value Taken Time  BP    Temp    Pulse    Resp    SpO2      Last Pain:  Vitals:   07/23/18 0814  TempSrc: Oral  PainSc: 0-No pain         Complications: No apparent anesthesia complications

## 2018-07-23 NOTE — Anesthesia Preprocedure Evaluation (Addendum)
Anesthesia Evaluation  Patient identified by MRN, date of birth, ID band Patient awake    Reviewed: Allergy & Precautions, NPO status , Patient's Chart, lab work & pertinent test results  History of Anesthesia Complications Negative for: history of anesthetic complications  Airway Mallampati: II  TM Distance: >3 FB Neck ROM: Full    Dental  (+) Upper Dentures, Lower Dentures   Pulmonary sleep apnea , former smoker,    Pulmonary exam normal breath sounds clear to auscultation       Cardiovascular hypertension, Pt. on medications Normal cardiovascular exam Rhythm:Regular Rate:Normal     Neuro/Psych Anxiety negative neurological ROS     GI/Hepatic Neg liver ROS, GERD  Controlled and Medicated,  Endo/Other  diabetes, Type 2, Oral Hypoglycemic Agents  Renal/GU negative Renal ROS     Musculoskeletal negative musculoskeletal ROS (+)   Abdominal   Peds  Hematology negative hematology ROS (+)   Anesthesia Other Findings Day of surgery medications reviewed with the patient.  Reproductive/Obstetrics                            Anesthesia Physical Anesthesia Plan  ASA: II  Anesthesia Plan: MAC   Post-op Pain Management:    Induction:   PONV Risk Score and Plan: Treatment may vary due to age or medical condition and Propofol infusion  Airway Management Planned: Natural Airway and Nasal Cannula  Additional Equipment:   Intra-op Plan:   Post-operative Plan:   Informed Consent: I have reviewed the patients History and Physical, chart, labs and discussed the procedure including the risks, benefits and alternatives for the proposed anesthesia with the patient or authorized representative who has indicated his/her understanding and acceptance.     Dental advisory given  Plan Discussed with: CRNA  Anesthesia Plan Comments:        Anesthesia Quick Evaluation

## 2018-07-23 NOTE — Telephone Encounter (Signed)
The pt wife has been advised that labs are in [place] and he can come today or tomorrow at his convenience. The pt has been advised of the information and verbalized understanding.

## 2018-07-23 NOTE — H&P (Signed)
GASTROENTEROLOGY OUTPATIENT PROCEDURE H&P NOTE   Primary Care Physician: Unk Pinto, MD  HPI: Glenn Stephens is a 63 y.o. male who presents for EGD/EUS with possible FNB.  Past Medical History:  Diagnosis Date  . Cancer ([place])    skin cancer  . Diabetes mellitus without complication ([place])   . Full dentures   . GERD (gastroesophageal reflux disease)   . Heart murmur   . Hernia, abdominal   . Hyperlipidemia   . Hypertension   . OSA (obstructive sleep apnea) 08/03/2013   pt does not use CPAP  . Pneumonia    as a small child only  . Unspecified vitamin D deficiency   . Wears glasses    Past Surgical History:  Procedure Laterality Date  . CHOLECYSTECTOMY N/A 05/23/2018   Procedure: LAPAROSCOPIC CHOLECYSTECTOMY;  Surgeon: Clovis Riley, MD;  Location: [facility] OR;  Service: General;  Laterality: N/A;  . LASER ABLATION     veins  . METATARSAL OSTEOTOMY WITH BUNIONECTOMY    . MULTIPLE TOOTH EXTRACTIONS    . orthopedic surgeries     knee,foot  . VASECTOMY     No current facility-administered medications for this encounter.    Allergies  Allergen Reactions  . Caduet [Amlodipine-Atorvastatin] Hives and Itching  . Statins     BLURRED VISION   Family History  Problem Relation Age of Onset  . Cancer Mother        melanoma with mets .  multiple endocrine neoplasia, MEN 1, in sister as well  . Heart disease Father   . Hyperlipidemia Father   . Diabetes Sister        prothrombin gene mutations, hypercoagulable disorder in sister and her kids.    . Hypertension Brother   . Stomach cancer Maternal Grandfather   . Colon cancer Neg Hx   . Colon polyps Neg Hx   . Esophageal cancer Neg Hx   . Rectal cancer Neg Hx   . Inflammatory bowel disease Neg Hx   . Liver disease Neg Hx   . Pancreatic cancer Neg Hx    Social History   Socioeconomic History  . Marital status: Married    Spouse name: Not on file  . Number of children: Not on file  . Years of education: Not on  file  . Highest education level: Not on file  Occupational History  . Not on file  Social Needs  . Financial resource strain: Not on file  . Food insecurity:    Worry: Not on file    Inability: Not on file  . Transportation needs:    Medical: Not on file    Non-medical: Not on file  Tobacco Use  . Smoking status: Former Smoker    Last attempt to quit: 06/24/1993    Years since quitting: 25.0  . Smokeless tobacco: Former Systems developer    Types: Chew  Substance and Sexual Activity  . Alcohol use: No  . Drug use: No  . Sexual activity: Not on file  Lifestyle  . Physical activity:    Days per week: Not on file    Minutes per session: Not on file  . Stress: Not on file  Relationships  . Social connections:    Talks on phone: Not on file    Gets together: Not on file    Attends religious service: Not on file    Active member of club or organization: Not on file    Attends meetings of clubs or organizations: Not  on file    Relationship status: Not on file  . Intimate partner violence:    Fear of current or ex partner: Not on file    Emotionally abused: Not on file    Physically abused: Not on file    Forced sexual activity: Not on file  Other Topics Concern  . Not on file  Social History Narrative  . Not on file    Physical Exam: Vital signs in last 24 hours: Temp:  [98 F (36.7 C)] 98 F (36.7 C) (01/15 0814) Resp:  [12] 12 (01/15 0814) BP: (158)/(82) 158/82 (01/15 0814) SpO2:  [99 %] 99 % (01/15 0814) Weight:  [109.8 kg] 109.8 kg (01/15 0814)   GEN: NAD EYE: Sclerae anicteric ENT: MMM CV: RR without R/Gs  RESP: CTAB posteriorly GI: Soft, NT/ND NEURO:  Alert & Oriented x 3  Lab Results: No results for input(s): WBC, HGB, HCT, PLT in the last 72 hours. BMET No results for input(s): NA, K, CL, CO2, GLUCOSE, BUN, CREATININE, CALCIUM in the last 72 hours. LFT No results for input(s): PROT, ALBUMIN, AST, ALT, ALKPHOS, BILITOT, BILIDIR, IBILI in the last 72  hours. PT/INR No results for input(s): LABPROT, INR in the last 72 hours.   Impression / Plan: This is a 63 y.o.male who presents for for EGD/EUS with possible FNB.  The risks and benefits of endoscopic evaluation were discussed with the patient; these include but are not limited to the risk of perforation, infection, bleeding, missed lesions, lack of diagnosis, severe illness requiring hospitalization, as well as anesthesia and sedation related illnesses.  The patient is agreeable to proceed.    Justice Britain, MD [facility] Office # [phone]

## 2018-07-23 NOTE — Anesthesia Procedure Notes (Signed)
Procedure Name: MAC Date/Time: 07/23/2018 9:04 AM Performed by: Kyung Rudd, CRNA Pre-anesthesia Checklist: Patient identified, Emergency Drugs available, Suction available, Patient being monitored and Timeout performed Patient Re-evaluated:Patient Re-evaluated prior to induction Oxygen Delivery Method: Nasal cannula Induction Type: IV induction Placement Confirmation: positive ETCO2 Dental Injury: Teeth and Oropharynx as per pre-operative assessment

## 2018-07-23 NOTE — Op Note (Signed)
[facility] Patient Name: Glenn Stephens Procedure Date : 07/23/2018 MRN: [id number] Attending MD: Justice Britain , MD Date of Birth: 1956-02-28 CSN: [id number] Age: 63 Admit Type: Outpatient Procedure:                Upper EUS Indications:              Suspected mass in pancreas on CT scan, Epigastric                            abdominal pain, Heartburn Providers:                Justice Britain, MD, Dorise Hiss, RN, Carlyn Reichert, RN, Cletis Athens, Technician, Rhae Lerner,                            CRNA Referring MD:              Medicines:                Monitored Anesthesia Care Complications:            No immediate complications. Estimated Blood Loss:     Estimated blood loss was minimal. Procedure:                Pre-Anesthesia Assessment:                           - Prior to the procedure, a History and Physical                            was performed, and patient medications and                            allergies were reviewed. The patient's tolerance of                            previous anesthesia was also reviewed. The risks                            and benefits of the procedure and the sedation                            options and risks were discussed with the patient.                            All questions were answered, and informed consent                            was obtained. Prior Anticoagulants: The patient has                            taken no previous anticoagulant or antiplatelet                            agents.  ASA Grade Assessment: II - A patient with                            mild systemic disease. After reviewing the risks                            and benefits, the patient was deemed in                            satisfactory condition to undergo the procedure.                           After obtaining informed consent, the endoscope was                            passed under direct vision.  Throughout the                            procedure, the patient's blood pressure, pulse, and                            oxygen saturations were monitored continuously. The                            [id number]-H190 ([id number]) Olympus gastroscope was                            introduced through the mouth, and advanced to the                            second part of duodenum. The [id number]-[id number] ([id number])                            Olympus duodenoscope was introduced through the                            mouth, and advanced to the second part of duodenum.                            The GF-UTC180 ([id number]) Olympus Linear EUS scope                            was introduced through the mouth, and advanced to                            the duodenum for ultrasound examination from the                            stomach and duodenum. The upper EUS was                            accomplished without difficulty. The patient  tolerated the procedure. Scope In: Scope Out: Findings:      ENDOSCOPIC FINDING: :      No gross lesions were noted in the entire esophagus. Biopsies were taken       with a cold forceps for histology from the proximal and middle esophagus       for EoE. Biopsies were taken with a cold forceps for histology from the       distal esophagus for EoE.      A widely patent and non-obstructing Schatzki ring was found at the       gastroesophageal junction. Biopsies were taken with a cold forceps for       histology and to disrupt the area.      A small hiatal hernia was found. The proximal extent of the gastric       folds (end of tubular esophagus) was 40 cm from the incisors. The hiatal       narrowing was 41 cm from the incisors. The Z-line was 39 cm from the       incisors.      Striped mildly erythematous mucosa without bleeding was found in the       gastric antrum.      No other gross lesions were noted in the entire examined stomach.       Biopsies were  taken with a cold forceps for histology and Helicobacter       pylori testing from the antrum/incisura/greater curve/lesser curve.      A single 7 mm nodule was found in the duodenal bulb on the right lateral       wall. Biopsies were taken with a cold forceps for histology to rule out       adenoma.      A single 2 mm sessile polyp was found in the duodenal bulb on the left       lateral wall before moving into the D1/D2 sweep. Biopsies were taken       with a cold forceps for histology - this was removed with the biopsy       forceps.      A medium non-bleeding diverticulum was found in the area of the papilla.      The major papilla was normal and on the edge of the aforementioned       diverticulum.      ENDOSONOGRAPHIC FINDING: :      Pancreatic parenchymal abnormalities were noted in the genu of the       pancreas, pancreatic body and pancreatic tail. These consisted of       hyperechoic strands. No other parenchymal abnormalities were noted on       the examination.      The pancreatic duct had a normal endosonographic appearance in the       pancreatic head (0.8 mm -> 1.0 mm), genu of the pancreas (0.7 mm -> 1.5       mm), body of the pancreas (0.4 mm) and tail of the pancreas (0.3 mm).       There was concern for possible divisum, but upon careful evaluation and       previous MRICP, it looks like the pancreatic duct wraps around the       region due to the diverticulum being in the region.      Endosonographic imaging in the entire pancreas showed no mass.      One stone was visualized endosonographically in the common  bile duct       (distally). The stone measured 3.4 mm by 2.4 mm in dimension. The stone       was rounded It was hyperechoic and characterized by shadowing.      There was no sign of other significant endosonographic abnormality in       the common bile duct (3.4 mm -> 4.9 mm) and in the common hepatic duct       (6.1 mm). No masses were identified.       Endosonographic imaging of the ampulla showed no intramural       (subepithelial) lesion or mass.      Endosonographic imaging in the visualized portion of the left lobe of       the liver showed no mass-lesion.      No malignant-appearing lymph nodes were visualized in the gastrohepatic       ligament (level 18), celiac region (level 20), peripancreatic region and       porta hepatis region.      The celiac region was visualized. Impression:               EGD Impression:                           - No gross lesions in esophagus. Biopsied for EoE.                           - Widely patent and non-obstructing Schatzki ring.                            Biopsied/Disrupted.                           - Small hiatal hernia.                           - Erythematous mucosa in the antrum. Otherwise, no                            gross lesions in the stomach. Biopsied for HP.                           - Nodule found in the duodenum. Biopsied to rule                            out adenoma.                           - A single duodenal polyp. Biopsied/removed.                           - Non-bleeding duodenal diverticulum.                           - Normal major papilla on the rim of the                            diverticulum.  EUS Impression:                           - Pancreatic parenchymal abnormalities consisting                            of hyperechoic strands were noted in the genu of                            the pancreas, pancreatic body and pancreatic tail.                            Otherwise no other parenchymal abnormalities were                            noted. These can be normal findings in a patient                            and not always indicative of chronic pancreatitis.                           - The pancreatic duct had a normal endosonographic                            appearance in the pancreatic head, genu of the                             pancreas, body of the pancreas and tail of the                            pancreas.                           - No mass or lesion was noted - query as did                            Radiology whether the previously noted lesion was                            actually the diverticulum.                           - One echogenic/shadowing stone was visualized                            endosonographically in the common bile duct                            distally.                           - There was no other sign of significant pathology                            in the common bile  duct and in the common hepatic                            duct.                           - No malignant-appearing lymph nodes were                            visualized in the gastrohepatic ligament (level                            18), celiac region (level 20), peripancreatic                            region and porta hepatis region. Recommendation:           - The patient will be observed post-procedure,                            until all discharge criteria are met.                           - Discharge patient to home.                           - Await path results.                           - Observe patient's clinical course.                           - Proceed with scheduled ERCP for attempt at bile                            duct clearance of visualized stone.                           - LFTs to be rechecked.                           - Maintain current dose of PPI.                           - After ERCP, will consider the role of possible                            repeat imaging of the Pancreas with Protocolled CT                            vs MRI in 6-12 months.                           - The findings and recommendations were discussed                            with  the patient.                           - The findings and recommendations were discussed                            with the  patient's family. Procedure Code(s):        --- Professional ---                           [phone], Esophagogastroduodenoscopy, flexible,                            transoral; with endoscopic ultrasound examination                            limited to the esophagus, stomach or duodenum, and                            adjacent structures                           43239, 2, Esophagogastroduodenoscopy, flexible,                            transoral; with biopsy, single or multiple Diagnosis Code(s):        --- Professional ---                           K22.2, Esophageal obstruction                           K44.9, Diaphragmatic hernia without obstruction or                            gangrene                           K31.89, Other diseases of stomach and duodenum                           K31.7, Polyp of stomach and duodenum                           K86.9, Disease of pancreas, unspecified                           K80.50, Calculus of bile duct without cholangitis                            or cholecystitis without obstruction                           I89.9, Noninfective disorder of lymphatic vessels                            and lymph nodes, unspecified  R10.13, Epigastric pain                           R12, Heartburn                           K57.10, Diverticulosis of small intestine without                            perforation or abscess without bleeding                           R93.3, Abnormal findings on diagnostic imaging of                            other parts of digestive tract CPT copyright 2018 [employer]. All rights reserved. The codes documented in this report are preliminary and upon coder review may  be revised to meet current compliance requirements. Justice Britain, MD 07/23/2018 10:32:20 AM Number of Addenda: 0

## 2018-07-23 NOTE — Anesthesia Postprocedure Evaluation (Signed)
Anesthesia Post Note  Patient: BRILEY SULTON  Procedure(s) Performed: UPPER ESOPHAGEAL ENDOSCOPIC ULTRASOUND (EUS) (N/A ) BIOPSY ESOPHAGOGASTRODUODENOSCOPY (EGD) WITH PROPOFOL (N/A )     Patient location during evaluation: PACU Anesthesia Type: MAC Level of consciousness: awake and alert Pain management: pain level controlled Vital Signs Assessment: post-procedure vital signs reviewed and stable Respiratory status: spontaneous breathing, nonlabored ventilation and respiratory function stable Cardiovascular status: blood pressure returned to baseline and stable Postop Assessment: no apparent nausea or vomiting Anesthetic complications: no    Last Vitals:  Vitals:   07/23/18 1018 07/23/18 1020  BP: (!) 138/58   Resp: 16   Temp:  36.4 C  SpO2: 96%     Last Pain:  Vitals:   07/23/18 1020  TempSrc: Oral  PainSc:                  Brennan Bailey

## 2018-07-24 ENCOUNTER — Encounter: Payer: Self-pay | Admitting: Gastroenterology

## 2018-07-24 ENCOUNTER — Telehealth: Payer: Self-pay

## 2018-07-24 ENCOUNTER — Other Ambulatory Visit: Payer: Self-pay

## 2018-07-24 DIAGNOSIS — K8050 Calculus of bile duct without cholangitis or cholecystitis without obstruction: Secondary | ICD-10-CM

## 2018-07-24 NOTE — Telephone Encounter (Signed)
-----   Message from Irving Copas., MD sent at 07/24/2018  5:15 AM EST ----- Dakisha Schoof, the patient's ultrasound exam showed evidence of choledocholithiasis.  We need to perform a ERCP.  He does not just need to see me in clinic in February discussed with him and his wife the results of the ultrasound exam.  Please schedule in the next few weeks.  Thank you. GM

## 2018-07-24 NOTE — Telephone Encounter (Signed)
appt made for 2/5 at 1030 am [facility] with Dr Rush Landmark. ERCP

## 2018-07-24 NOTE — Telephone Encounter (Signed)
ERCP scheduled, pt instructed and medications reviewed.  Patient instructions mailed to home.  Patient to call with any questions or concerns.  

## 2018-08-04 ENCOUNTER — Ambulatory Visit: Payer: Self-pay | Admitting: Physician Assistant

## 2018-08-07 ENCOUNTER — Other Ambulatory Visit: Payer: Self-pay | Admitting: Internal Medicine

## 2018-08-07 MED FILL — ROSUVASTATIN CALCIUM 5 MG T: 5 | 90 days supply | Qty: 90 | Fill #1

## 2018-08-07 MED FILL — SYNJARDY XR 25-1000 MG TB24: 25-1000 | 30 days supply | Qty: 30 | Fill #3

## 2018-08-07 MED FILL — CITALOPRAM HBR 40 MG TABLET: 40 | 90 days supply | Qty: 90 | Fill #1

## 2018-08-11 MED FILL — OMEPRAZOLE 20 MG CPDR: 20 | 90 days supply | Qty: 90 | Fill #0

## 2018-08-12 ENCOUNTER — Other Ambulatory Visit: Payer: Self-pay

## 2018-08-12 ENCOUNTER — Encounter (HOSPITAL_COMMUNITY): Payer: Self-pay | Admitting: *Deleted

## 2018-08-12 NOTE — Progress Notes (Signed)
Spoke with pt for pre-op call. Pt sees Dr. Daneen Schick but states he does not have cardiac hx, sees him for his HTN. Pt is a type 2 Diabetic. Last A1C was 8.4 on 04/17/18. States his fasting blood sugar is usually between 120-150. States he took his Aspirin this AM, was told by office not to take it the day of the procedure.   Echo - 06/12/18 EKG - 05/22/18

## 2018-08-13 ENCOUNTER — Encounter (HOSPITAL_COMMUNITY): Payer: Self-pay | Admitting: Gastroenterology

## 2018-08-13 ENCOUNTER — Other Ambulatory Visit: Payer: Self-pay

## 2018-08-13 ENCOUNTER — Ambulatory Visit (HOSPITAL_COMMUNITY)
Admission: RE | Admit: 2018-08-13 | Discharge: 2018-08-13 | Disposition: A | Discharge status: Home / Self Care | Payer: 59 | Attending: Gastroenterology | Admitting: Gastroenterology

## 2018-08-13 ENCOUNTER — Ambulatory Visit (HOSPITAL_COMMUNITY): Payer: 59 | Admitting: Anesthesiology

## 2018-08-13 ENCOUNTER — Ambulatory Visit (HOSPITAL_COMMUNITY): Payer: 59

## 2018-08-13 ENCOUNTER — Encounter (HOSPITAL_COMMUNITY)
Admission: RE | Disposition: A | Discharge status: Home / Self Care | Payer: Self-pay | Source: Home / Self Care | Attending: Gastroenterology

## 2018-08-13 DIAGNOSIS — K319 Disease of stomach and duodenum, unspecified: Secondary | ICD-10-CM | POA: Insufficient documentation

## 2018-08-13 DIAGNOSIS — G4733 Obstructive sleep apnea (adult) (pediatric): Secondary | ICD-10-CM | POA: Diagnosis not present

## 2018-08-13 DIAGNOSIS — K219 Gastro-esophageal reflux disease without esophagitis: Secondary | ICD-10-CM | POA: Diagnosis not present

## 2018-08-13 DIAGNOSIS — E119 Type 2 diabetes mellitus without complications: Secondary | ICD-10-CM | POA: Insufficient documentation

## 2018-08-13 DIAGNOSIS — K2950 Unspecified chronic gastritis without bleeding: Secondary | ICD-10-CM | POA: Diagnosis not present

## 2018-08-13 DIAGNOSIS — E785 Hyperlipidemia, unspecified: Secondary | ICD-10-CM | POA: Diagnosis not present

## 2018-08-13 DIAGNOSIS — Z85828 Personal history of other malignant neoplasm of skin: Secondary | ICD-10-CM | POA: Insufficient documentation

## 2018-08-13 DIAGNOSIS — R945 Abnormal results of liver function studies: Principal | ICD-10-CM

## 2018-08-13 DIAGNOSIS — K831 Obstruction of bile duct: Secondary | ICD-10-CM

## 2018-08-13 DIAGNOSIS — K869 Disease of pancreas, unspecified: Secondary | ICD-10-CM

## 2018-08-13 DIAGNOSIS — K3189 Other diseases of stomach and duodenum: Secondary | ICD-10-CM

## 2018-08-13 DIAGNOSIS — E559 Vitamin D deficiency, unspecified: Secondary | ICD-10-CM | POA: Insufficient documentation

## 2018-08-13 DIAGNOSIS — Z87891 Personal history of nicotine dependence: Secondary | ICD-10-CM | POA: Insufficient documentation

## 2018-08-13 DIAGNOSIS — K8050 Calculus of bile duct without cholangitis or cholecystitis without obstruction: Secondary | ICD-10-CM

## 2018-08-13 DIAGNOSIS — R7989 Other specified abnormal findings of blood chemistry: Secondary | ICD-10-CM

## 2018-08-13 DIAGNOSIS — K835 Biliary cyst: Secondary | ICD-10-CM | POA: Diagnosis not present

## 2018-08-13 DIAGNOSIS — K2940 Chronic atrophic gastritis without bleeding: Secondary | ICD-10-CM | POA: Diagnosis not present

## 2018-08-13 DIAGNOSIS — I10 Essential (primary) hypertension: Secondary | ICD-10-CM | POA: Diagnosis not present

## 2018-08-13 DIAGNOSIS — R17 Unspecified jaundice: Secondary | ICD-10-CM

## 2018-08-13 HISTORY — PX: SPHINCTEROTOMY: SHX5544

## 2018-08-13 HISTORY — PX: ENDOSCOPIC RETROGRADE CHOLANGIOPANCREATOGRAPHY (ERCP) WITH PROPOFOL: SHX5810

## 2018-08-13 HISTORY — PX: REMOVAL OF STONES: SHX5545

## 2018-08-13 HISTORY — PX: BIOPSY: SHX5522

## 2018-08-13 LAB — GLUCOSE, CAPILLARY: Glucose-Capillary: 173 mg/dL — ABNORMAL HIGH (ref 70–99)

## 2018-08-13 SURGERY — ENDOSCOPIC RETROGRADE CHOLANGIOPANCREATOGRAPHY (ERCP) WITH PROPOFOL
Anesthesia: General

## 2018-08-13 MED ORDER — INDOMETHACIN 50 MG RE SUPP
RECTAL | Status: DC | PRN
  Administered 2018-08-13: 100 mg via RECTAL

## 2018-08-13 MED ORDER — IOPAMIDOL (ISOVUE-300) INJECTION 61%
INTRAVENOUS | Status: AC
  Filled 2018-08-13: qty 50

## 2018-08-13 MED ORDER — ROCURONIUM BROMIDE 10 MG/ML (PF) SYRINGE
PREFILLED_SYRINGE | INTRAVENOUS | Status: DC | PRN
  Administered 2018-08-13: 50 mg via INTRAVENOUS

## 2018-08-13 MED ORDER — CIPROFLOXACIN IN D5W 400 MG/200ML IV SOLN
INTRAVENOUS | Status: AC
  Filled 2018-08-13: qty 200

## 2018-08-13 MED ORDER — PROPOFOL 10 MG/ML IV BOLUS
INTRAVENOUS | Status: DC | PRN
  Administered 2018-08-13: 100 mg via INTRAVENOUS
  Administered 2018-08-13: 50 mg via INTRAVENOUS

## 2018-08-13 MED ORDER — SODIUM CHLORIDE 0.9 % IV SOLN: INTRAVENOUS | Status: DC

## 2018-08-13 MED ORDER — PHENYLEPHRINE 40 MCG/ML (10ML) SYRINGE FOR IV PUSH (FOR BLOOD PRESSURE SUPPORT)
PREFILLED_SYRINGE | INTRAVENOUS | Status: DC | PRN
  Administered 2018-08-13: 120 ug via INTRAVENOUS

## 2018-08-13 MED ORDER — SUGAMMADEX SODIUM 200 MG/2ML IV SOLN
INTRAVENOUS | Status: DC | PRN
  Administered 2018-08-13: 200 mg via INTRAVENOUS

## 2018-08-13 MED ORDER — DEXAMETHASONE SODIUM PHOSPHATE 10 MG/ML IJ SOLN
INTRAMUSCULAR | Status: DC | PRN
  Administered 2018-08-13: 5 mg via INTRAVENOUS

## 2018-08-13 MED ORDER — GLUCAGON HCL RDNA (DIAGNOSTIC) 1 MG IJ SOLR
INTRAMUSCULAR | Status: AC
  Filled 2018-08-13: qty 1

## 2018-08-13 MED ORDER — MIDAZOLAM HCL 2 MG/2ML IJ SOLN
INTRAMUSCULAR | Status: DC | PRN
  Administered 2018-08-13: 2 mg via INTRAVENOUS

## 2018-08-13 MED ORDER — INDOMETHACIN 50 MG RE SUPP
RECTAL | Status: AC
  Filled 2018-08-13: qty 2

## 2018-08-13 MED ORDER — GLUCAGON HCL RDNA (DIAGNOSTIC) 1 MG IJ SOLR
INTRAMUSCULAR | Status: DC | PRN
  Administered 2018-08-13 (×2): 0.25 mg via INTRAVENOUS

## 2018-08-13 MED ORDER — LIDOCAINE 2% (20 MG/ML) 5 ML SYRINGE
INTRAMUSCULAR | Status: DC | PRN
  Administered 2018-08-13: 60 mg via INTRAVENOUS

## 2018-08-13 MED ORDER — LACTATED RINGERS IV SOLN
INTRAVENOUS | Status: DC
  Administered 2018-08-13: 09:00:00 via INTRAVENOUS

## 2018-08-13 MED ORDER — ONDANSETRON HCL 4 MG/2ML IJ SOLN
INTRAMUSCULAR | Status: DC | PRN
  Administered 2018-08-13: 4 mg via INTRAVENOUS

## 2018-08-13 MED ORDER — IOPAMIDOL (ISOVUE-300) INJECTION 61%
INTRAVENOUS | Status: DC | PRN
  Administered 2018-08-13: 30 mL

## 2018-08-13 MED ORDER — ACETAMINOPHEN 500 MG PO TABS
1000.0000 mg | ORAL_TABLET | Freq: Once | ORAL | Status: AC
  Administered 2018-08-13: 1000 mg via ORAL
  Filled 2018-08-13: qty 2

## 2018-08-13 MED ORDER — PROMETHAZINE HCL 25 MG/ML IJ SOLN: 6.2500 mg | INTRAMUSCULAR | Status: DC | PRN

## 2018-08-13 MED ORDER — FENTANYL CITRATE (PF) 100 MCG/2ML IJ SOLN
INTRAMUSCULAR | Status: DC | PRN
  Administered 2018-08-13: 50 ug via INTRAVENOUS

## 2018-08-13 MED ORDER — CIPROFLOXACIN IN D5W 400 MG/200ML IV SOLN
400.0000 mg | Freq: Once | INTRAVENOUS | Status: AC
  Administered 2018-08-13: 400 mg via INTRAVENOUS

## 2018-08-13 NOTE — Anesthesia Postprocedure Evaluation (Signed)
Anesthesia Post Note  Patient: Glenn Stephens  Procedure(s) Performed: ENDOSCOPIC RETROGRADE CHOLANGIOPANCREATOGRAPHY (ERCP) WITH PROPOFOL (N/A ) BIOPSY SPHINCTEROTOMY REMOVAL OF STONES     Patient location during evaluation: Endoscopy Anesthesia Type: General Level of consciousness: awake and alert Pain management: pain level controlled Vital Signs Assessment: post-procedure vital signs reviewed and stable Respiratory status: spontaneous breathing, nonlabored ventilation, respiratory function stable and patient connected to nasal cannula oxygen Cardiovascular status: blood pressure returned to baseline and stable Postop Assessment: no apparent nausea or vomiting Anesthetic complications: no    Last Vitals:  Vitals:   08/13/18 1035 08/13/18 1045  BP: (!) 156/92 (!) 160/86  Pulse: 68 64  Resp: 17 14  Temp:    SpO2: 98% 98%    Last Pain:  Vitals:   08/13/18 1045  TempSrc:   PainSc: 0-No pain                 Glenn Stephens

## 2018-08-13 NOTE — Anesthesia Preprocedure Evaluation (Addendum)
Anesthesia Evaluation  Patient identified by MRN, date of birth, ID band Patient awake    Reviewed: Allergy & Precautions, NPO status , Patient's Chart, lab work & pertinent test results  Airway Mallampati: I  TM Distance: >3 FB Neck ROM: Full    Dental no notable dental hx. (+) Edentulous Upper, Edentulous Lower   Pulmonary sleep apnea (does not use CPAP) , former smoker,    Pulmonary exam normal breath sounds clear to auscultation       Cardiovascular hypertension, Pt. on medications negative cardio ROS Normal cardiovascular exam Rhythm:Regular Rate:Normal  TTE 06/2018 - Normal LV EF by 2D and 3D measurements. Aortic valve sclerosis without stenosis   Neuro/Psych PSYCHIATRIC DISORDERS Anxiety negative neurological ROS     GI/Hepatic Neg liver ROS, GERD  Medicated and Poorly Controlled,  Endo/Other  negative endocrine ROSdiabetes, Type 2, Oral Hypoglycemic Agents  Renal/GU negative Renal ROS  negative genitourinary   Musculoskeletal negative musculoskeletal ROS (+)   Abdominal   Peds  Hematology negative hematology ROS (+)   Anesthesia Other Findings choledocholithiasis  Reproductive/Obstetrics                            Anesthesia Physical Anesthesia Plan  ASA: III  Anesthesia Plan: General   Post-op Pain Management:    Induction: Intravenous  PONV Risk Score and Plan: 2 and Dexamethasone, Ondansetron and Midazolam  Airway Management Planned: Oral ETT  Additional Equipment:   Intra-op Plan:   Post-operative Plan: Extubation in OR  Informed Consent: I have reviewed the patients History and Physical, chart, labs and discussed the procedure including the risks, benefits and alternatives for the proposed anesthesia with the patient or authorized representative who has indicated his/her understanding and acceptance.     Dental advisory given  Plan Discussed with:  CRNA  Anesthesia Plan Comments:        Anesthesia Quick Evaluation

## 2018-08-13 NOTE — Transfer of Care (Signed)
Immediate Anesthesia Transfer of Care Note  Patient: COLTON TASSIN  Procedure(s) Performed: ENDOSCOPIC RETROGRADE CHOLANGIOPANCREATOGRAPHY (ERCP) WITH PROPOFOL (N/A ) BIOPSY SPHINCTEROTOMY REMOVAL OF STONES  Patient Location: Endoscopy Unit  Anesthesia Type:General  Level of Consciousness: awake, alert  and oriented  Airway & Oxygen Therapy: Patient Spontanous Breathing and Patient connected to nasal cannula oxygen  Post-op Assessment: Report given to RN, Post -op Vital signs reviewed and stable and Patient moving all extremities  Post vital signs: Reviewed and stable  Last Vitals:  Vitals Value Taken Time  BP 177/76 08/13/2018 10:25 AM  Temp 36.6 C 08/13/2018 10:25 AM  Pulse 68 08/13/2018 10:35 AM  Resp 17 08/13/2018 10:35 AM  SpO2 98 % 08/13/2018 10:35 AM  Vitals shown include unvalidated device data.  Last Pain:  Vitals:   08/13/18 1025  TempSrc: Oral  PainSc: 0-No pain         Complications: No apparent anesthesia complications

## 2018-08-13 NOTE — Op Note (Signed)
[facility] Patient Name: Glenn Stephens Procedure Date : 08/13/2018 MRN: [id number] Attending MD: Justice Britain , MD Date of Birth: 11-24-1955 CSN: [id number] Age: 63 Admit Type: Outpatient Procedure:                ERCP Indications:              Bile duct stone(s), Bile duct stone on Ultrasound,                            Abnormal liver function test, Gastric Mapping for                            Intestinal Metaplasia Providers:                Justice Britain, MD, Carlyn Reichert, RN, Charolette Child, Technician, Trixie Deis, CRNA Referring MD:             Unk Pinto Medicines:                General Anesthesia, Indomethacin 100 mg PR, Cipro                            322 mg IV Complications:            No immediate complications. Estimated Blood Loss:     Estimated blood loss was minimal. Procedure:                Pre-Anesthesia Assessment:                           - Prior to the procedure, a History and Physical                            was performed, and patient medications and                            allergies were reviewed. The patient's tolerance of                            previous anesthesia was also reviewed. The risks                            and benefits of the procedure and the sedation                            options and risks were discussed with the patient.                            All questions were answered, and informed consent                            was obtained. Prior Anticoagulants: The patient has  taken previous NSAID medication. ASA Grade                            Assessment: II - A patient with mild systemic                            disease. After reviewing the risks and benefits,                            the patient was deemed in satisfactory condition to                            undergo the procedure.                           After obtaining informed  consent, the scope was                            passed under direct vision. Throughout the                            procedure, the patient's blood pressure, pulse, and                            oxygen saturations were monitored continuously. The                            GIF-H190 ([id number]) Olympus gastroscope was                            introduced through the mouth, and used to inject                            contrast into and used to locate the major papilla.                            The TJF-Q180V ([id number]) Olympus duodenoscope was                            introduced through the mouth, and used to inject                            contrast into and used to inject contrast into the                            bile duct. The ERCP was accomplished without                            difficulty. The patient tolerated the procedure.                            Fluoroscopic imaging on CANOPY. Scope In: Scope Out: Findings:      The scout film was normal.      The  upper GI tract was traversed under direct vision without detailed       examination. No gross lesions were noted in the entire esophagus.       Striped mildly erythematous mucosa without bleeding was found in the       gastric antrum. Otherwise, no gross lesions were noted in the entire       examined stomach. The major papilla was on the rim of a diverticulum but       was otherwise normal. Gastric mapping biopsies were obtained. Biopsies       were taken in the gastric antrum through the esophagogastroduodenoscope       with the cold forceps for histology. Biopsies were taken at the incisura       through the esophagogastroduodenoscope with the cold forceps for       histology. Biopsies were taken on the greater curvature of the stomach       through the esophagogastroduodenoscope with the cold forceps for       histology. Biopsies were taken on the lesser curvature of the stomach       through the  esophagogastroduodenoscope with the cold forceps for       histology. Biopsies were taken in the gastric fundus through the       esophagogastroduodenoscope with the cold forceps for histology. Biopsies       were taken in the cardia through the esophagogastroduodenoscope with the       cold forceps for histology.      A short 0.035 inch Soft Jagwire was passed into the biliary tree on the       first attempt. The Autotome sphincterotome was passed over the guidewire       and the bile duct was then deeply cannulated. Contrast was injected. I       personally interpreted the bile duct images. Ductal flow of contrast was       adequate. Image quality was adequate. Contrast extended to the hepatic       ducts. Opacification of the entire biliary tree except for the       gallbladder was successful. The maximum diameter of the ducts was 8 mm.       The lower third of the main duct contained a possible filling defect       thought to be a stone vs sludge. A 6 mm biliary sphincterotomy was made       with a monofilament Autotome sphincterotome using ERBE electrocautery       due to being on the rim of the diverticulum. There was no       post-sphincterotomy bleeding. To discover objects, the biliary tree was       swept with a retrieval balloon starting at the bifurcation. Sludge was       swept from the duct and one dark pigmented stone was removed. No stones       remained at the completion of the sweeping. An occlusion cholangiogram       was performed that showed no further significant biliary pathology.      A pancreatogram was not performed.      The duodenoscope was withdrawn from the patient. Impression:               - No gross lesions in esophagus.                           -  Erythematous mucosa in the antrum but otherwise                            normal mucosa. Gastric mapping biopsies were                            performed.                           - The major papilla was on  the rim of a                            diverticulum but was otherwise normal.                           - A filling defect consistent with possible stone                            vs sludge was seen on the cholangiogram.                           - Biliary sludge and choledocholithiasis was found.                            Complete removal was accomplished by biliary                            sphincterotomy and balloon sweeping. Recommendation:           - The patient will be observed post-procedure,                            until all discharge criteria are met.                           - Discharge patient to home.                           - Patient has a contact number available for                            emergencies. The signs and symptoms of potential                            delayed complications were discussed with the                            patient. Return to normal activities tomorrow.                            Written discharge instructions were provided to the                            patient.                           -  Observe patient's clinical course.                           - Watch for pancreatitis, bleeding, perforation,                            and cholangitis.                           - Check liver enzymes (AST, ALT, alkaline                            phosphatase, bilirubin) in 1 week.                           - Return to GI clinic in 4-6 weeks for follow up.                           - The findings and recommendations were discussed                            with the patient.                           - The findings and recommendations were discussed                            with the patient's family. Procedure Code(s):        --- Professional ---                           [phone], Endoscopic retrograde                            cholangiopancreatography (ERCP); with removal of                            calculi/debris from  biliary/pancreatic duct(s)                           43262, Endoscopic retrograde                            cholangiopancreatography (ERCP); with                            sphincterotomy/papillotomy                           43239, Esophagogastroduodenoscopy, flexible,                            transoral; with biopsy, single or multiple Diagnosis Code(s):        --- Professional ---                           K31.89, Other diseases of stomach and duodenum  K80.50, Calculus of bile duct without cholangitis                            or cholecystitis without obstruction                           R94.5, Abnormal results of liver function studies                           R93.2, Abnormal findings on diagnostic imaging of                            liver and biliary tract CPT copyright 2018 American Medical Association. All rights reserved. The codes documented in this report are preliminary and upon coder review may  be revised to meet current compliance requirements. Justice Britain, MD 08/13/2018 10:28:57 AM Number of Addenda: 0

## 2018-08-13 NOTE — Anesthesia Procedure Notes (Signed)
Procedure Name: Intubation Date/Time: 08/13/2018 9:22 AM Performed by: Leonor Liv, CRNA Pre-anesthesia Checklist: Patient identified, Emergency Drugs available, Suction available and Patient being monitored Patient Re-evaluated:Patient Re-evaluated prior to induction Oxygen Delivery Method: Circle System Utilized Preoxygenation: Pre-oxygenation with 100% oxygen Induction Type: IV induction Ventilation: Mask ventilation without difficulty Laryngoscope Size: Mac and 4 Grade View: Grade I Tube type: Oral Tube size: 7.5 mm Number of attempts: 1 Airway Equipment and Method: Stylet and Oral airway Placement Confirmation: ETT inserted through vocal cords under direct vision,  positive ETCO2 and breath sounds checked- equal and bilateral Secured at: 22 cm Tube secured with: Tape Dental Injury: Teeth and Oropharynx as per pre-operative assessment

## 2018-08-13 NOTE — Discharge Instructions (Signed)
YOU HAD AN ENDOSCOPIC PROCEDURE TODAY: Refer to the procedure report and other information in the discharge instructions given to you for any specific questions about what was found during the examination. If this information does not answer your questions, please call [place] office at [phone] to clarify.  ° °YOU SHOULD EXPECT: Some feelings of bloating in the abdomen. Passage of more gas than usual. Walking can help get rid of the air that was put into your GI tract during the procedure and reduce the bloating. If you had a lower endoscopy (such as a colonoscopy or flexible sigmoidoscopy) you may notice spotting of blood in your stool or on the toilet paper. Some abdominal soreness may be present for a day or two, also. ° °DIET: Your first meal following the procedure should be a light meal and then it is ok to progress to your normal diet. A half-sandwich or bowl of soup is an example of a good first meal. Heavy or fried foods are harder to digest and may make you feel nauseous or bloated. Drink plenty of fluids but you should avoid alcoholic beverages for 24 hours. If you had a esophageal dilation, please see attached instructions for diet.   ° °ACTIVITY: Your care partner should take you home directly after the procedure. You should plan to take it easy, moving slowly for the rest of the day. You can resume normal activity the day after the procedure however YOU SHOULD NOT DRIVE, use power tools, machinery or perform tasks that involve climbing or major physical exertion for 24 hours (because of the sedation medicines used during the test).  ° °SYMPTOMS TO REPORT IMMEDIATELY: °A gastroenterologist can be reached at any hour. Please call [phone]  for any of the following symptoms:  °Following lower endoscopy (colonoscopy, flexible sigmoidoscopy) °Excessive amounts of blood in the stool  °Significant tenderness, worsening of abdominal pains  °Swelling of the abdomen that is new, acute  °Fever of 100° or  higher  °Following upper endoscopy (EGD, EUS, ERCP, esophageal dilation) °Vomiting of blood or coffee ground material  °New, significant abdominal pain  °New, significant chest pain or pain under the shoulder blades  °Painful or persistently difficult swallowing  °New shortness of breath  °Black, tarry-looking or red, bloody stools ° °FOLLOW UP:  °If any biopsies were taken you will be contacted by phone or by letter within the next 1-3 weeks. Call [phone]  if you have not heard about the biopsies in 3 weeks.  °Please also call with any specific questions about appointments or follow up tests. ° °

## 2018-08-13 NOTE — Interval H&P Note (Signed)
History and Physical Interval Note:  08/13/2018 8:53 AM  Glenn Stephens  has presented today for surgery, with the diagnosis of choledocholithiasis  The various methods of treatment have been discussed with the patient and family. After consideration of risks, benefits and other options for treatment, the patient has consented to  Procedure(s): ENDOSCOPIC RETROGRADE CHOLANGIOPANCREATOGRAPHY (ERCP) WITH PROPOFOL (N/A) as a surgical intervention .  The patient's history has been reviewed, patient examined, no change in status, stable for surgery.  I have reviewed the patient's chart and labs.  Questions were answered to the patient's satisfaction.    The risks of an ERCP were discussed at length, including but not limited to the risk of perforation, bleeding, abdominal pain, post-ERCP pancreatitis (while usually mild can be severe and even life threatening).    [employer]

## 2018-08-14 ENCOUNTER — Encounter (HOSPITAL_COMMUNITY): Payer: Self-pay | Admitting: Gastroenterology

## 2018-08-14 ENCOUNTER — Telehealth: Payer: Self-pay

## 2018-08-14 MED ORDER — CIPROFLOXACIN HCL 500 MG PO TABS: 500.0000 mg | ORAL_TABLET | Freq: Two times a day (BID) | ORAL | 0 refills | Status: AC

## 2018-08-14 NOTE — Addendum Note (Signed)
Addended by: Justice Britain on: 08/14/2018 07:37 PM   Modules accepted: Orders

## 2018-08-14 NOTE — Telephone Encounter (Signed)
I called the patient back and spoke with his wife. Low grade temp of 100.0 earlier this afternoon, he feels well otherwise. Eating fine, denies pain. They took some tylenol. He adamently refused to go to the ED tonight unless he feels poorly. Advised them to keep an eye on temp, if rising or he develops any symptoms which we reviewed he should be evaluated. They agreed with the plan. I told them we could call and touch base with them in the AM for reassessment or call me overnight tonight if any questions.   Wynetta Fines

## 2018-08-14 NOTE — Telephone Encounter (Signed)
Patient is S/P ERCP 08/13/18.  Patient's wife reports fever this pm T - max 100.0.Marland Kitchen  He denies nausea, vomiting, any abdominal pain.  He is fatigued , but denies any other symptoms at this point. Dr. Havery Moros you are on call.

## 2018-08-14 NOTE — Telephone Encounter (Signed)
Discussed with Dr. Havery Moros.  Patient to take Tylenol.  If he has continued greater than 101 despite tylenol, has new Gi complaints, pain, N&V he needs to go to the ED for evaluation.

## 2018-08-14 NOTE — Telephone Encounter (Signed)
Glenn Stephens, thank you for the update. I just saw this. I called he and his wife to discuss things as well. He is feeling slightly better since the afternoon and call. No more fevers. No issues of other abdominal complaints. Denies jaundice or darkened urine. He is taking in good amounts of PO intake. I have asked that they monitor his temperature closely overnight. If he develops another fever >100.3 then he will take 975 mg of Tylenol and initiate Ciprofloxacin 500 mg BID. I will send a prescription to the [place] so that they can pick up and have available to take if this occurs. They will also alert Dr. Havery Moros overnight if necessary and if he were to break through with fevers or other symptoms with this initiation they will alert Dr. Havery Moros and understand the need for likely evaluation at the ED. Patty please call the patient tomorrow to see how he is doing. Thank you. GM

## 2018-08-15 ENCOUNTER — Other Ambulatory Visit (INDEPENDENT_AMBULATORY_CARE_PROVIDER_SITE_OTHER): Payer: 59

## 2018-08-15 ENCOUNTER — Encounter: Payer: Self-pay | Admitting: Gastroenterology

## 2018-08-15 DIAGNOSIS — R945 Abnormal results of liver function studies: Secondary | ICD-10-CM

## 2018-08-15 DIAGNOSIS — R7989 Other specified abnormal findings of blood chemistry: Secondary | ICD-10-CM

## 2018-08-15 LAB — HEPATIC FUNCTION PANEL
ALT: 43 U/L (ref 0–53)
AST: 22 U/L (ref 0–37)
Albumin: 4.4 g/dL (ref 3.5–5.2)
Alkaline Phosphatase: 71 U/L (ref 39–117)
Bilirubin, Direct: 0.3 mg/dL (ref 0.0–0.3)
TOTAL PROTEIN: 7.1 g/dL (ref 6.0–8.3)
Total Bilirubin: 1.5 mg/dL — ABNORMAL HIGH (ref 0.2–1.2)

## 2018-08-15 NOTE — Telephone Encounter (Signed)
Thank you  for update

## 2018-08-15 NOTE — Telephone Encounter (Signed)
Left message on machine to call back  

## 2018-08-15 NOTE — Telephone Encounter (Signed)
The pt will begin abx today.  He is having NO pain and no other symptoms other than slight temp elevation.  He will call on call doc over the weekend with any problems or go to the ED if he develops pain.  He will also call on Monday to update [place] on how he is doing.

## 2018-08-15 NOTE — Telephone Encounter (Signed)
99.6 temp 15 min before next tylenol 500 mg (dose every 4 hours) He still feels fatigued no other symptoms. Please advise

## 2018-08-15 NOTE — Telephone Encounter (Signed)
PT is returning your call if you could call her back.Glenn Stephens

## 2018-08-15 NOTE — Telephone Encounter (Signed)
Let him start antibiotics today. If he is having any abdominal pain then we need to get a KUB this afternoon to ensure nothing too significant. Thank you. GM

## 2018-08-18 ENCOUNTER — Other Ambulatory Visit: Payer: Self-pay

## 2018-08-18 DIAGNOSIS — R7989 Other specified abnormal findings of blood chemistry: Secondary | ICD-10-CM

## 2018-08-18 DIAGNOSIS — R945 Abnormal results of liver function studies: Principal | ICD-10-CM

## 2018-08-18 NOTE — Telephone Encounter (Signed)
Thank you for update Patty. Glad to hear he is doing well. Let's just plan for him to complete the antibiotics course. GM

## 2018-08-21 ENCOUNTER — Telehealth: Payer: Self-pay | Admitting: Gastroenterology

## 2018-08-21 ENCOUNTER — Other Ambulatory Visit (INDEPENDENT_AMBULATORY_CARE_PROVIDER_SITE_OTHER): Payer: 59

## 2018-08-21 DIAGNOSIS — R7989 Other specified abnormal findings of blood chemistry: Secondary | ICD-10-CM

## 2018-08-21 DIAGNOSIS — R945 Abnormal results of liver function studies: Secondary | ICD-10-CM | POA: Diagnosis not present

## 2018-08-21 LAB — HEPATIC FUNCTION PANEL
ALT: 38 U/L (ref 0–53)
AST: 35 U/L (ref 0–37)
Albumin: 4.2 g/dL (ref 3.5–5.2)
Alkaline Phosphatase: 59 U/L (ref 39–117)
Bilirubin, Direct: 0.2 mg/dL (ref 0.0–0.3)
Total Bilirubin: 0.7 mg/dL (ref 0.2–1.2)
Total Protein: 7.1 g/dL (ref 6.0–8.3)

## 2018-08-21 NOTE — Telephone Encounter (Signed)
Call to discuss the results of his hepatic function panel which is normalized. Patient's wife states that the patient is doing well and has regained his appetite and back to his normal energy and self. The patient has an upcoming clinic visit which we can go ahead and cancel for now as is not required in follow-up now. He is going to require a repeat endoscopy in 1 year for gastric mapping in the setting of his intestinal metaplasia and then surveillance will be dictated thereafter. In approximately 6 months from January I want a CT abdomen with IV/p.o. 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 We will work on scheduling that for July or August of this year. Patient and family appreciative for our call back and care.

## 2018-08-22 NOTE — Telephone Encounter (Signed)
Recall in Epic for 1 year repeat EGD and staff message to set up Ct in 6 months from January.

## 2018-09-05 ENCOUNTER — Other Ambulatory Visit: Payer: Self-pay | Admitting: Physician Assistant

## 2018-09-05 ENCOUNTER — Other Ambulatory Visit: Payer: Self-pay | Admitting: Adult Health

## 2018-09-05 MED FILL — GABAPENTIN 300 MG CAPSULE: 300 | 30 days supply | Qty: 90 | Fill #0

## 2018-09-05 MED FILL — SYNJARDY XR 25-1000 MG TB24: 25-1000 | 30 days supply | Qty: 30 | Fill #0

## 2018-09-08 NOTE — Progress Notes (Signed)
FOLLOW UP  Assessment and Plan:   Hypertension Well controlled with current medications  Monitor blood pressure at home; patient to call if consistently greater than 130/80 Continue DASH diet.   Reminder to go to the ER if any CP, SOB, nausea, dizziness, severe HA, changes vision/speech, left arm numbness and tingling and jaw pain.  Cholesterol Currently above goal; statin intolerant; continue zetia; diet emphasized Continue low cholesterol diet and exercise.  Check lipid panel.   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  Perform daily foot/skin check, notify office of any concerning changes.  Check A1C  Obesity with co morbidities Long discussion about weight loss, diet, and exercise Recommended diet heavy in fruits and veggies and low in animal meats, cheeses, and dairy products, appropriate calorie intake Discussed ideal weight for height and initial weight goal (235 lb) Patient will work on cutting down on soft drinks Will follow up in 3 months  Vitamin D Def Below goal at last check;  He has not initiated supplementation Defer Vit D level Recommended to start on 5000 IU daily  Continue diet and meds as discussed. Further disposition pending results of labs. Discussed med's effects and SE's.   Over 30 minutes of exam, counseling, chart review, and critical decision making was performed.   Future Appointments  Date Time Provider [place]  04/28/2019  9:00 AM Vicie Mutters, PA-C [facility] None    ----------------------------------------------------------------------------------------------------------------------  HPI 63 y.o. male  presents for 3 month follow up on hypertension, cholesterol, diabetes, obesity and vitamin D deficiency.   Patient had choleodocolithiasis, s/p  cholecystectomy on 05/23/2018.  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 Has repeat CT AB for pancreatic head mass but appears benign at this time, had biopsies and all were normal but one, will have repeat scan in 6 months and being monitored by Dr. Georjean Mode.  Marland Kitchen  BMI is Body mass index is 32.36 kg/m., he has not been working on diet and exercise. Wt Readings from Last 8 Encounters:  09/10/18 238 lb 9.6 oz (108.2 kg)  07/23/18 242 lb (109.8 kg)  06/17/18 238 lb (108 kg)  06/11/18 236 lb 6 oz (107.2 kg)  05/27/18 230 lb 3.2 oz (104.4 kg)  05/23/18 241 lb (109.3 kg)  05/20/18 241 lb (109.3 kg)  05/06/18 244 lb (110.7 kg)    His blood pressure has been controlled at home, he is on lisinopril and losartan, today their BP is BP: 122/86  He does not workout. He denies chest pain, shortness of breath, dizziness. Lab Results  Component Value Date   CREATININE 0.92 05/27/2018   BUN 17 05/27/2018   NA 139 05/27/2018   K 4.5 05/27/2018   CL 100 05/27/2018   CO2 27 05/27/2018     Lab Results  Component Value Date   GFRNONAA 88 05/27/2018    He is on cholesterol medication zetia and crestor 5 mg (recently added) daily and states some more knee pain but wife states he has been more active. His cholesterol is not at goal. The cholesterol last visit was:   Lab Results  Component Value Date   CHOL 234 (H) 04/17/2018   HDL 46 04/17/2018   LDLCALC 146 (H) 04/17/2018   TRIG 275 (H) 04/17/2018   CHOLHDL 5.1 (H) 04/17/2018    He has  not been working on diet and exercise for T2 diabetes on synjardy XR, and denies foot ulcerations, increased appetite, nausea, paresthesia of the feet, polydipsia, polyuria, visual disturbances, vomiting and weight loss. He does have a meter but does not regularly check sugars, his sugars have been 154-205, they are very fluctuating. He has freestyle and is interested in the [employer].  Last A1C in the office  was:  Lab Results  Component Value Date   HGBA1C 8.4 (H) 04/17/2018   Patient is on Vitamin D supplement.   Lab Results  Component Value Date   VD25OH 51 04/17/2018       Current Medications:  Current Outpatient Medications on File Prior to Visit  Medication Sig  . aspirin 81 MG tablet Take 81 mg by mouth daily.  . blood glucose meter kit and supplies Test sugars once dailyDispense based insurance preference. E11.9  . Cholecalciferol 125 MCG (5000 UT) capsule Take 5,000 Units by mouth daily.  Marland Kitchen ezetimibe (ZETIA) 10 MG tablet TAKE 1 TABLET (10 MG TOTAL) BY MOUTH DAILY.  Marland Kitchen ibuprofen (ADVIL,MOTRIN) 200 MG tablet Take 600 mg by mouth every 6 (six) hours as needed for headache, mild pain, moderate pain or cramping.  Marland Kitchen lisinopril (PRINIVIL,ZESTRIL) 40 MG tablet Take 40 mg by mouth daily.  Marland Kitchen losartan (COZAAR) 50 MG tablet TAKE 1 TABLET (50 MG TOTAL) BY MOUTH DAILY.  . Magnesium 100 MG CAPS Take 100 mg by mouth daily.   . TURMERIC PO Take 1 capsule by mouth 2 (two) times daily.    No current facility-administered medications on file prior to visit.      Allergies:  Allergies  Allergen Reactions  . Caduet [Amlodipine-Atorvastatin] Hives and Itching  . Statins     BLURRED VISION     Medical History:  Past Medical History:  Diagnosis Date  . Cancer ([place])    skin cancer - basal cell  . Diabetes mellitus without complication ([place])   . Full dentures   . GERD (gastroesophageal reflux disease)   . Heart murmur    no problems per pt  . Hernia, abdominal   . Hyperlipidemia   . Hypertension   . OSA (obstructive sleep apnea) 08/03/2013   pt does not use CPAP  . Pneumonia    as a small child only  . Unspecified vitamin D deficiency   . Wears glasses    Family history- Reviewed and unchanged Social history- Reviewed and unchanged   Review of Systems:  Review of Systems  Constitutional: Negative for malaise/fatigue and weight loss.  HENT: Negative for hearing loss and  tinnitus.   Eyes: Negative for blurred vision and double vision.  Respiratory: Negative for cough, shortness of breath and wheezing.   Cardiovascular: Negative for chest pain, palpitations, orthopnea, claudication and leg swelling.  Gastrointestinal: Negative for abdominal pain, blood in stool, constipation, diarrhea, heartburn, melena, nausea and vomiting.  Genitourinary: Negative.   Musculoskeletal: Negative for joint pain and myalgias.  Skin: Negative for rash.  Neurological: Negative for dizziness, tingling, sensory change, weakness and headaches.  Endo/Heme/Allergies: Negative for polydipsia.  Psychiatric/Behavioral: Negative.   All other systems reviewed and are negative.     Physical Exam: BP 122/86   Pulse 61   Temp 97.6 F (36.4 C)   Ht 6' (1.829 m)   Wt 238 lb 9.6 oz (108.2 kg)   SpO2 97%   BMI 32.36 kg/m  Wt Readings from Last 3 Encounters:  09/10/18 238 lb 9.6 oz (108.2 kg)  07/23/18  242 lb (109.8 kg)  06/17/18 238 lb (108 kg)   General Appearance: Well nourished, in no apparent distress. Eyes: PERRLA, EOMs, conjunctiva no swelling or erythema Sinuses: No Frontal/maxillary tenderness ENT/Mouth: Ext aud canals clear, TMs without erythema, bulging. No erythema, swelling, or exudate on post pharynx.  Tonsils not swollen or erythematous. Hearing normal.  Neck: Supple, thyroid normal.  Respiratory: Respiratory effort normal, BS equal bilaterally without rales, rhonchi, wheezing or stridor.  Cardio: RRR with no MRGs. Brisk peripheral pulses without edema.  Abdomen: Soft, + BS.  Non tender, no guarding, rebound, hernias, masses. Lymphatics: Non tender without lymphadenopathy.  Musculoskeletal: Full ROM, 5/5 strength, Normal gait Skin: Warm, dry without rashes, lesions, ecchymosis.  Neuro: Cranial nerves intact. No cerebellar symptoms.  Psych: Awake and oriented X 3, normal affect, Insight and Judgment appropriate.    Vicie Mutters, PA-C 11:54 AM [facility]

## 2018-09-10 ENCOUNTER — Ambulatory Visit: Payer: 59 | Admitting: Physician Assistant

## 2018-09-10 ENCOUNTER — Ambulatory Visit: Payer: Self-pay | Admitting: Physician Assistant

## 2018-09-10 ENCOUNTER — Other Ambulatory Visit: Payer: Self-pay

## 2018-09-10 ENCOUNTER — Encounter: Payer: Self-pay | Admitting: Physician Assistant

## 2018-09-10 VITALS — BP 122/86 | HR 61 | Temp 97.6°F | Ht 72.0 in | Wt 238.6 lb

## 2018-09-10 DIAGNOSIS — Z79899 Other long term (current) drug therapy: Secondary | ICD-10-CM

## 2018-09-10 DIAGNOSIS — N181 Chronic kidney disease, stage 1: Secondary | ICD-10-CM | POA: Diagnosis not present

## 2018-09-10 DIAGNOSIS — E1122 Type 2 diabetes mellitus with diabetic chronic kidney disease: Secondary | ICD-10-CM | POA: Diagnosis not present

## 2018-09-10 DIAGNOSIS — I10 Essential (primary) hypertension: Secondary | ICD-10-CM | POA: Diagnosis not present

## 2018-09-10 DIAGNOSIS — E782 Mixed hyperlipidemia: Secondary | ICD-10-CM | POA: Diagnosis not present

## 2018-09-10 DIAGNOSIS — E1142 Type 2 diabetes mellitus with diabetic polyneuropathy: Secondary | ICD-10-CM

## 2018-09-10 MED ORDER — EMPAGLIFLOZIN-METFORMIN HCL ER 25-1000 MG PO TB24: 1.0000 | ORAL_TABLET | Freq: Every day | ORAL | 3 refills | Status: DC

## 2018-09-10 MED ORDER — CITALOPRAM HYDROBROMIDE 40 MG PO TABS: 40.0000 mg | ORAL_TABLET | Freq: Every day | ORAL | 3 refills | Status: DC

## 2018-09-10 MED ORDER — OMEPRAZOLE 20 MG PO CPDR: 20.0000 mg | DELAYED_RELEASE_CAPSULE | Freq: Every day | ORAL | 3 refills | Status: DC

## 2018-09-10 MED ORDER — GABAPENTIN 300 MG PO CAPS: ORAL_CAPSULE | ORAL | 1 refills | Status: DC

## 2018-09-10 MED ORDER — ROSUVASTATIN CALCIUM 5 MG PO TABS: 5.0000 mg | ORAL_TABLET | Freq: Every day | ORAL | 3 refills | Status: DC

## 2018-09-10 MED ORDER — FREESTYLE LIBRE SENSOR SYSTEM MISC: 0 refills | Status: DC

## 2018-09-10 NOTE — Patient Instructions (Signed)
What does your A1C results mean?  Your A1C is a measure of your sugar over the past 3 months   Use this chart as a guide to compare the results of your A1C blood test to your estimated average daily blood sugar:  A1C Range Average Sugar  4.0-6.0% 60-120 mg/dl  6.1-7.0% 121 - 150 mg/dl  7.1-8.0% 151-180 mg/dl  8.1-9.0% 181-210 mg/dl  10.1-11% 211-240 mg/dl  11.1-12.0% 271-300 mg/dl  12.1-13.0% 301-330 mg/dl  13.1-14.0% 331-360 mg/dl  Greater than 14.0% Greater than 360 mg/dl      Bad carbs also include fruit juice, alcohol, and sweet tea. These are empty calories that do not signal to your brain that you are full.   Please remember the good carbs are still carbs which convert into sugar. So please measure them out no more than 1/2-1 cup of rice, oatmeal, pasta, and beans  Veggies are however free foods! Pile them on.   Not all fruit is created equal. Please see the list below, the fruit at the bottom is higher in sugars than the fruit at the top. Please avoid all dried fruits.     

## 2018-09-11 LAB — LIPID PANEL
Cholesterol: 142 mg/dL (ref <200)
HDL: 42 mg/dL (ref >=40)
LDL CHOLESTEROL (CALC): 75 mg/dL
Non-HDL Cholesterol (Calc): 100 mg/dL (calc) (ref <130)
TRIGLYCERIDES: 146 mg/dL (ref <150)
Total CHOL/HDL Ratio: 3.4 (calc) (ref <5.0)

## 2018-09-11 LAB — COMPLETE METABOLIC PANEL WITH GFR
AG Ratio: 1.7 (calc) (ref 1.0–2.5)
ALT: 33 U/L (ref 9–46)
AST: 29 U/L (ref 10–35)
Albumin: 4.5 g/dL (ref 3.6–5.1)
Alkaline phosphatase (APISO): 51 U/L (ref 35–144)
BUN: 17 mg/dL (ref 7–25)
CO2: 26 mmol/L (ref 20–32)
Calcium: 9.5 mg/dL (ref 8.6–10.3)
Chloride: 102 mmol/L (ref 98–110)
Creat: 0.98 mg/dL (ref 0.70–1.25)
GFR, Est African American: 95 mL/min/{1.73_m2} (ref >=60)
GFR, Est Non African American: 82 mL/min/{1.73_m2} (ref >=60)
Globulin: 2.6 g/dL (calc) (ref 1.9–3.7)
Glucose, Bld: 127 mg/dL — ABNORMAL HIGH (ref 65–99)
Potassium: 4.2 mmol/L (ref 3.5–5.3)
Sodium: 138 mmol/L (ref 135–146)
Total Bilirubin: 1.2 mg/dL (ref 0.2–1.2)
Total Protein: 7.1 g/dL (ref 6.1–8.1)

## 2018-09-11 LAB — CBC WITH DIFFERENTIAL/PLATELET
Absolute Monocytes: 336 cells/uL (ref 200–950)
BASOS ABS: 30 {cells}/uL (ref 0–200)
Basophils Relative: 0.5 %
Eosinophils Absolute: 258 cells/uL (ref 15–500)
Eosinophils Relative: 4.3 %
HCT: 41 % (ref 38.5–50.0)
Hemoglobin: 14.2 g/dL (ref 13.2–17.1)
Lymphs Abs: 2922 cells/uL (ref 850–3900)
MCH: 29.3 pg (ref 27.0–33.0)
MCHC: 34.6 g/dL (ref 32.0–36.0)
MCV: 84.7 fL (ref 80.0–100.0)
MPV: 10 fL (ref 7.5–12.5)
Monocytes Relative: 5.6 %
Neutro Abs: 2454 cells/uL (ref 1500–7800)
Neutrophils Relative %: 40.9 %
Platelets: 188 10*3/uL (ref 140–400)
RBC: 4.84 10*6/uL (ref 4.20–5.80)
RDW: 12.3 % (ref 11.0–15.0)
Total Lymphocyte: 48.7 %
WBC: 6 10*3/uL (ref 3.8–10.8)

## 2018-09-11 LAB — HEMOGLOBIN A1C
EAG (MMOL/L): 10.3 (calc)
Hgb A1c MFr Bld: 8.1 % of total Hgb — ABNORMAL HIGH (ref <5.7)
Mean Plasma Glucose: 186 (calc)

## 2018-09-11 LAB — TSH: TSH: 1.7 mIU/L (ref 0.40–4.50)

## 2018-09-11 LAB — MAGNESIUM: Magnesium: 1.8 mg/dL (ref 1.5–2.5)

## 2018-09-16 ENCOUNTER — Ambulatory Visit: Payer: Self-pay | Admitting: Adult Health Nurse Practitioner

## 2018-09-25 MED FILL — EZETIMIBE 10 MG TABS: 10 | 90 days supply | Qty: 90 | Fill #3

## 2018-09-25 MED FILL — LOSARTAN POTASSIUM 50 MG TA: 50 | 90 days supply | Qty: 90 | Fill #3

## 2018-09-26 ENCOUNTER — Other Ambulatory Visit: Payer: Self-pay | Admitting: Physician Assistant

## 2018-09-30 ENCOUNTER — Ambulatory Visit: Payer: 59 | Admitting: Gastroenterology

## 2018-10-08 MED FILL — SYNJARDY XR 25-1000 MG TB24: 25-1000 | 30 days supply | Qty: 30 | Fill #0

## 2018-10-18 MED FILL — FREESTYLE LITE TEST STRIP: 90 days supply | Qty: 100 | Fill #0

## 2018-11-03 MED FILL — FREESTYLE LANCETS: 90 days supply | Qty: 100 | Fill #0

## 2018-11-03 MED FILL — GABAPENTIN 300 MG CAPSULE: 300 | 30 days supply | Qty: 90 | Fill #0

## 2018-11-12 MED FILL — SYNJARDY XR 25-1000 MG TB24: 25-1000 | 30 days supply | Qty: 30 | Fill #1

## 2018-11-20 MED FILL — CITALOPRAM HBR 40 MG TABLET: 40 | 90 days supply | Qty: 90 | Fill #0

## 2018-11-20 MED FILL — OMEPRAZOLE 20 MG CPDR: 20 | 90 days supply | Qty: 90 | Fill #0

## 2018-12-09 ENCOUNTER — Encounter: Payer: Self-pay | Admitting: Physician Assistant

## 2018-12-09 NOTE — Progress Notes (Signed)
FOLLOW UP  THIS ENCOUNTER IS A VIRTUAL/TELEPHONE VISIT DUE TO COVID-19 - PATIENT WAS NOT SEEN IN THE OFFICE.  PATIENT HAS CONSENTED TO VIRTUAL VISIT / TELEMEDICINE VISIT  This provider placed a call to Glenn Stephens using telephone, his appointment was changed to a virtual office visit to reduce the risk of exposure to the COVID-19 virus and to help [employer] remain healthy and safe. The virtual visit will also provide continuity of care. He verbalizes understanding.    Assessment and Plan:  Tremor Will do klonopin for tremor and abnormal REM sleep Can refer back to neuro if not better Needs neuro exam at next OV but tremor is with intention  Hypertension Well controlled with current medications  Monitor blood pressure at home; patient to call if consistently greater than 130/80 Continue DASH diet.   Reminder to go to the ER if any CP, SOB, nausea, dizziness, severe HA, changes vision/speech, left arm numbness and tingling and jaw pain.  Cholesterol Currently above goal; continue zetia, PATIENT TOLERATING LOW DOSE CRESTOR; diet emphasized Continue low cholesterol diet and exercise.   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 Continue medication: synjardy Xr - WILL ADD ON METFORMIN AND CINNAMON HE WILL CHECK SUGARS AND GIVE ME A LOG IN 2 WEEKS CHECK LAB ONLY 1-2 WEEKS Patient states his sugars are uncontrolled and fluctuating, will try freestyle libre  Obesity with co morbidities Long discussion about weight loss, diet, and exercise Recommended diet heavy in fruits and veggies and low in animal meats, cheeses, and dairy products, appropriate calorie intake Discussed ideal weight for height and initial weight goal (235 lb) Patient will work on cutting down on soft drinks Will follow up in 3 months  Vitamin D Def Below goal at last check;  He has not initiated supplementation Defer Vit D level Recommended to start on 5000 IU  daily  Continue diet and meds as discussed. Further disposition pending results of labs. Discussed med's effects and SE's.   Over 30 minutes of exam, counseling, chart review, and critical decision making was performed.   Future Appointments  Date Time Provider [place]  04/28/2019  9:00 AM Glenn Mutters, PA-C [facility] None    ----------------------------------------------------------------------------------------------------------------------  HPI 63 y.o. male  presents for 3 month follow up on hypertension, cholesterol, diabetes, obesity and vitamin D deficiency.   Patient also complains of fine motor tremor with intent, none at rest. He has PTSD/dreams at night. Had sleep study 2017 has seen Dr. Beacher May in the past.  .  BMI is There is no height or weight on file to calculate BMI., he has not been working on diet and exercise. Wt Readings from Last 8 Encounters:  09/10/18 238 lb 9.6 oz (108.2 kg)  07/23/18 242 lb (109.8 kg)  06/17/18 238 lb (108 kg)  06/11/18 236 lb 6 oz (107.2 kg)  05/27/18 230 lb 3.2 oz (104.4 kg)  05/23/18 241 lb (109.3 kg)  05/20/18 241 lb (109.3 kg)  05/06/18 244 lb (110.7 kg)    His blood pressure has been controlled at home states in normal limits, he is on lisinopril and losartan, today their BP is    He does not workout. He denies chest pain, shortness of breath, dizziness. Lab Results  Component Value Date   CREATININE 0.98 09/10/2018   BUN 17 09/10/2018   NA 138 09/10/2018   K 4.2 09/10/2018   CL 102 09/10/2018   CO2 26 09/10/2018  Lab Results  Component Value Date   GFRNONAA 82 09/10/2018    He is on cholesterol medication zetia and crestor 5 mg (recently added) daily and states some more knee pain but wife states he has been more active. His cholesterol is not at goal. The cholesterol last visit was:   Lab Results  Component Value Date   CHOL 142 09/10/2018   HDL 42 09/10/2018   LDLCALC 75 09/10/2018   TRIG 146  09/10/2018   CHOLHDL 3.4 09/10/2018    He has not been working on diet and exercise for T2 diabetes  on synjardy XR 25/1000 once a day without more metformin Admits that he is stress eating and sugars have been up  and denies foot ulcerations, increased appetite, nausea, paresthesia of the feet, polydipsia, polyuria, visual disturbances, vomiting and weight loss.  On average fasting is 200 in the morning No low blood sugars  He has freestyle and is interested in the freestyle [place].   Last A1C in the office was:  Lab Results  Component Value Date   HGBA1C 8.1 (H) 09/10/2018   Patient is on Vitamin D supplement.   Lab Results  Component Value Date   VD25OH 51 04/17/2018       Current Medications:  Current Outpatient Medications on File Prior to Visit  Medication Sig  . aspirin 81 MG tablet Take 81 mg by mouth daily.  . blood glucose meter kit and supplies Test sugars once dailyDispense based insurance preference. E11.9  . Cholecalciferol 125 MCG (5000 UT) capsule Take 5,000 Units by mouth daily.  . citalopram (CELEXA) 40 MG tablet Take 1 tablet (40 mg total) by mouth daily.  . Continuous Blood Gluc Sensor (FREESTYLE LIBRE SENSOR SYSTEM) MISC Check sugars 3 x a day DX E10.9  . Empagliflozin-metFORMIN HCl ER (SYNJARDY XR) 25-1000 MG TB24 Take 1 tablet by mouth daily.  Marland Kitchen ezetimibe (ZETIA) 10 MG tablet TAKE 1 TABLET (10 MG TOTAL) BY MOUTH DAILY.  Marland Kitchen FREESTYLE LITE test strip USE TO TEST BLOOD GLUCOSE ONCE A DAY  . gabapentin (NEURONTIN) 300 MG capsule Take 1 to 3 capsules by mouth at night  . ibuprofen (ADVIL,MOTRIN) 200 MG tablet Take 600 mg by mouth every 6 (six) hours as needed for headache, mild pain, moderate pain or cramping.  . Lancets (FREESTYLE) lancets USE TO TEST BLOOD GLUCOSE ONCE A DAY  . lisinopril (PRINIVIL,ZESTRIL) 40 MG tablet Take 40 mg by mouth daily.  Marland Kitchen losartan (COZAAR) 50 MG tablet TAKE 1 TABLET (50 MG TOTAL) BY MOUTH DAILY.  . Magnesium 100 MG CAPS Take 100 mg  by mouth daily.   Marland Kitchen omeprazole (PRILOSEC) 20 MG capsule Take 1 capsule (20 mg total) by mouth daily.  . rosuvastatin (CRESTOR) 5 MG tablet Take 1 tablet (5 mg total) by mouth at bedtime.  . TURMERIC PO Take 1 capsule by mouth 2 (two) times daily.    No current facility-administered medications on file prior to visit.      Allergies:  Allergies  Allergen Reactions  . Caduet [Amlodipine-Atorvastatin] Hives and Itching  . Statins     BLURRED VISION     Medical History:  Past Medical History:  Diagnosis Date  . Acute calculous cholecystitis s/p lap cholecystectopmy 05/23/2018 05/22/2018  . Cancer ([place])    skin cancer - basal cell  . Diabetes mellitus without complication ([place])   . Full dentures   . GERD (gastroesophageal reflux disease)   . Heart murmur    no problems per  pt  . Hernia, abdominal   . Hyperlipidemia   . Hypertension   . OSA (obstructive sleep apnea) 08/03/2013   pt does not use CPAP  . Pneumonia    as a small child only  . Superficial thrombophlebitis of left upper extremity 06/18/2018  . Unspecified vitamin D deficiency   . Wears glasses    Family history- Reviewed and unchanged Social history- Reviewed and unchanged   Review of Systems:  Review of Systems  Constitutional: Negative for malaise/fatigue and weight loss.  HENT: Negative for hearing loss and tinnitus.   Eyes: Negative for blurred vision and double vision.  Respiratory: Negative for cough, shortness of breath and wheezing.   Cardiovascular: Negative for chest pain, palpitations, orthopnea, claudication and leg swelling.  Gastrointestinal: Negative for abdominal pain, blood in stool, constipation, diarrhea, heartburn, melena, nausea and vomiting.  Genitourinary: Negative.   Musculoskeletal: Negative for joint pain and myalgias.  Skin: Negative for rash.  Neurological: Negative for dizziness, tingling, sensory change, weakness and headaches.  Endo/Heme/Allergies: Negative for polydipsia.   Psychiatric/Behavioral: Negative.   All other systems reviewed and are negative.     Physical Exam: There were no vitals taken for this visit. Wt Readings from Last 3 Encounters:  09/10/18 238 lb 9.6 oz (108.2 kg)  07/23/18 242 lb (109.8 kg)  06/17/18 238 lb (108 kg)  General Appearance:Well sounding, in no apparent distress.  ENT/Mouth: No hoarseness, No cough for duration of visit.  Respiratory: completing full sentences without distress, without audible wheeze Neuro: Awake and oriented X 3,  Psych:  Insight and Judgment appropriate.   Glenn Mutters, PA-C 10:52 AM [facility]

## 2018-12-11 ENCOUNTER — Encounter: Payer: Self-pay | Admitting: Physician Assistant

## 2018-12-11 ENCOUNTER — Other Ambulatory Visit: Payer: Self-pay | Admitting: Physician Assistant

## 2018-12-11 ENCOUNTER — Other Ambulatory Visit: Payer: Self-pay

## 2018-12-11 ENCOUNTER — Ambulatory Visit: Payer: 59 | Admitting: Physician Assistant

## 2018-12-11 DIAGNOSIS — E1165 Type 2 diabetes mellitus with hyperglycemia: Secondary | ICD-10-CM | POA: Diagnosis not present

## 2018-12-11 DIAGNOSIS — E785 Hyperlipidemia, unspecified: Secondary | ICD-10-CM | POA: Diagnosis not present

## 2018-12-11 DIAGNOSIS — N181 Chronic kidney disease, stage 1: Secondary | ICD-10-CM | POA: Diagnosis not present

## 2018-12-11 DIAGNOSIS — E1142 Type 2 diabetes mellitus with diabetic polyneuropathy: Secondary | ICD-10-CM | POA: Diagnosis not present

## 2018-12-11 DIAGNOSIS — R251 Tremor, unspecified: Secondary | ICD-10-CM | POA: Diagnosis not present

## 2018-12-11 DIAGNOSIS — E1122 Type 2 diabetes mellitus with diabetic chronic kidney disease: Secondary | ICD-10-CM

## 2018-12-11 DIAGNOSIS — Z79899 Other long term (current) drug therapy: Secondary | ICD-10-CM

## 2018-12-11 DIAGNOSIS — I10 Essential (primary) hypertension: Secondary | ICD-10-CM

## 2018-12-11 DIAGNOSIS — E559 Vitamin D deficiency, unspecified: Secondary | ICD-10-CM

## 2018-12-11 MED ORDER — CLONAZEPAM 1 MG PO TABS: 1.0000 mg | ORAL_TABLET | Freq: Every evening | ORAL | 0 refills | Status: DC | PRN

## 2018-12-11 MED ORDER — METFORMIN HCL ER 500 MG PO TB24: 500.0000 mg | ORAL_TABLET | Freq: Every day | ORAL | 3 refills | Status: DC

## 2018-12-11 MED ORDER — METFORMIN HCL ER 500 MG PO TB24: 500.0000 mg | ORAL_TABLET | Freq: Two times a day (BID) | ORAL | 3 refills | Status: DC

## 2018-12-11 MED ORDER — FREESTYLE LIBRE SENSOR SYSTEM MISC: 0 refills | Status: DC

## 2018-12-11 MED FILL — clonazePAM 1 MG TABS: 1 | 30 days supply | Qty: 30 | Fill #0

## 2018-12-11 MED FILL — metFORMIN HCL ER 500 MG TB2: 500 | 90 days supply | Qty: 180 | Fill #0

## 2018-12-11 NOTE — Patient Instructions (Addendum)
Add on cinnamon 1000mg  2 x a day for sugars Can cause heart burn  Add on metformin 500mg  2 a day to the synjardy XR 25/1000  We are starting you on Metformin to prevent or treat diabetes.  Metformin does NOT cause low blood sugars.   In order to create energy your cells need insulin and sugar but sometime your cells do not accept the insulin and this can cause increased sugars and decreased energy.   The Metformin helps your cells accept insulin and the sugar this help: 1) increase your energy  2) weight loss.    The two most common side effects are nausea and diarrhea, follow these rules to avoid it but these symptoms get better with time on the medication.    ALSO You can take imodium per box instructions when starting metformin if needed.   Rules of metformin: 1) start out slow with only one pill daily. Our goal for you is 4 pills a day or 2000mg  total.  2) take with your largest meal. 3) Take with least amount of carbs.   Call if you have any problems.   Can take klonopin 1mg  can do 1/2-1 daily     When it comes to diets, agreement about the perfect plan isn't easy to find, even among the experts. Experts at the [employer] developed an idea known as the Healthy Eating Plate. Just imagine a plate divided into logical, healthy portions.  The emphasis is on diet quality:  Load up on vegetables and fruits - one-half of your plate: Aim for color and variety, and remember that potatoes don't count.  Go for whole grains - one-quarter of your plate: Whole wheat, barley, wheat berries, quinoa, oats, brown rice, and foods made with them. If you want pasta, go with whole wheat pasta.  Protein power - one-quarter of your plate: Fish, chicken, beans, and nuts are all healthy, versatile protein sources. Limit red meat.  The diet, however, does go beyond the plate, offering a few other suggestions.  Use healthy plant oils, such as olive, canola, soy, corn,  sunflower and peanut. Check the labels, and avoid partially hydrogenated oil, which have unhealthy trans fats.  If you're thirsty, drink water. Coffee and tea are good in moderation, but skip sugary drinks and limit milk and dairy products to one or two daily servings.  The type of carbohydrate in the diet is more important than the amount. Some sources of carbohydrates, such as vegetables, fruits, whole grains, and beans-are healthier than others.  Finally, stay active.   Tremor A tremor is trembling or shaking that you cannot control. Most tremors affect the hands or arms. Tremors can also affect the head, vocal cords, face, and other parts of the body. There are many types of tremors. Common types include:  Essential tremor. These usually occur in people older than 40. It may run in families and can happen in otherwise healthy people.  Resting tremor. These occur when the muscles are at rest, such as when your hands are resting in your lap. People with Parkinson's disease often have resting tremors.  Postural tremor. These occur when you try to hold a pose, such as keeping your hands outstretched.  Kinetic tremor. These occur during purposeful movement, such as trying to touch a finger to your nose.  Task-specific tremor. These may occur when you perform certain tasks such as writing, speaking, or standing.  Psychogenic tremor. These dramatically lessen or disappear when you are  distracted. They can happen in people of all ages. Some types of tremors have no known cause. Tremors can also be a symptom of nervous system problems (neurological disorders) that may occur with aging. Some tremors go away with treatment, while others do not. Follow these instructions at home: Lifestyle      Limit alcohol intake to no more than 1 drink a day for nonpregnant women and 2 drinks a day for men. One drink equals 12 oz of beer, 5 oz of wine, or 1 oz of hard liquor.  Do not use any products that  contain nicotine or tobacco, such as cigarettes and e-cigarettes. If you need help quitting, ask your health care provider.  Avoid extreme heat and extreme cold.  Limit your caffeine intake, as told by your health care provider.  Try to get 8 hours of sleep each night.  Find ways to manage your stress, such as meditation or yoga. General instructions  Take over-the-counter and prescription medicines only as told by your health care provider.  Keep all follow-up visits as told by your health care provider. This is important. Contact a health care provider if you:  Develop a tremor after starting a new medicine.  Have a tremor along with other symptoms such as: ? Numbness. ? Tingling. ? Pain. ? Weakness.  Notice that your tremor gets worse.  Notice that your tremor interferes with your day-to-day life. Summary  A tremor is trembling or shaking that you cannot control.  Most tremors affect the hands or arms.  Some types of tremors have no known cause. Others may be a symptom of nervous system problems (neurological disorders).  Make sure you discuss any tremors you have with your health care provider. This information is not intended to replace advice given to you by your health care provider. Make sure you discuss any questions you have with your health care provider. Document Released: 06/15/2002 Document Revised: 04/25/2017 Document Reviewed: 04/25/2017 Elsevier Interactive Patient Education  2019 [employer].

## 2018-12-11 NOTE — Telephone Encounter (Signed)
-----   Message from Elenor Quinones, Cecilia sent at 12/11/2018 12:15 PM EDT ----- Regarding: med clarification Contact: [phone] PER OFFICE/[facility]- (ALISHA) NOTE:    Need clarification on RX

## 2018-12-17 ENCOUNTER — Telehealth: Payer: Self-pay

## 2018-12-17 NOTE — Telephone Encounter (Signed)
A prior Auth was submitted for a Continuous blood Glu Sensor kit & it was denied at that due patient not BEING ON INSULIN at this time.  A letter was faxed to the pharmacy on chart.  June 10th 2020 at 9:30am

## 2018-12-18 ENCOUNTER — Other Ambulatory Visit: Payer: Self-pay

## 2018-12-18 ENCOUNTER — Other Ambulatory Visit: Payer: 59

## 2018-12-18 DIAGNOSIS — I10 Essential (primary) hypertension: Secondary | ICD-10-CM

## 2018-12-18 DIAGNOSIS — E1142 Type 2 diabetes mellitus with diabetic polyneuropathy: Secondary | ICD-10-CM | POA: Diagnosis not present

## 2018-12-18 DIAGNOSIS — E559 Vitamin D deficiency, unspecified: Secondary | ICD-10-CM

## 2018-12-18 DIAGNOSIS — E785 Hyperlipidemia, unspecified: Secondary | ICD-10-CM

## 2018-12-18 DIAGNOSIS — Z79899 Other long term (current) drug therapy: Secondary | ICD-10-CM | POA: Diagnosis not present

## 2018-12-18 MED FILL — GABAPENTIN 300 MG CAPSULE: 300 | 30 days supply | Qty: 90 | Fill #0

## 2018-12-18 MED FILL — ROSUVASTATIN CALCIUM 5 MG T: 5 | 90 days supply | Qty: 90 | Fill #0

## 2018-12-18 MED FILL — SYNJARDY XR 25-1000 MG TB24: 25-1000 | 30 days supply | Qty: 30 | Fill #0

## 2018-12-19 LAB — COMPLETE METABOLIC PANEL WITH GFR
AG Ratio: 1.9 (calc) (ref 1.0–2.5)
ALT: 35 U/L (ref 9–46)
AST: 28 U/L (ref 10–35)
Albumin: 4.5 g/dL (ref 3.6–5.1)
Alkaline phosphatase (APISO): 48 U/L (ref 35–144)
BUN: 21 mg/dL (ref 7–25)
CO2: 25 mmol/L (ref 20–32)
Calcium: 9.7 mg/dL (ref 8.6–10.3)
Chloride: 100 mmol/L (ref 98–110)
Creat: 1.06 mg/dL (ref 0.70–1.25)
GFR, Est African American: 86 mL/min/{1.73_m2} (ref >=60)
GFR, Est Non African American: 74 mL/min/{1.73_m2} (ref >=60)
Globulin: 2.4 g/dL (calc) (ref 1.9–3.7)
Glucose, Bld: 213 mg/dL — ABNORMAL HIGH (ref 65–99)
Potassium: 4.2 mmol/L (ref 3.5–5.3)
Sodium: 135 mmol/L (ref 135–146)
Total Bilirubin: 0.9 mg/dL (ref 0.2–1.2)
Total Protein: 6.9 g/dL (ref 6.1–8.1)

## 2018-12-19 LAB — CBC WITH DIFFERENTIAL/PLATELET
Absolute Monocytes: 440 cells/uL (ref 200–950)
Basophils Absolute: 50 cells/uL (ref 0–200)
Basophils Relative: 0.7 %
Eosinophils Absolute: 270 cells/uL (ref 15–500)
Eosinophils Relative: 3.8 %
HCT: 42.6 % (ref 38.5–50.0)
Hemoglobin: 14.5 g/dL (ref 13.2–17.1)
Lymphs Abs: 3365 cells/uL (ref 850–3900)
MCH: 29 pg (ref 27.0–33.0)
MCHC: 34 g/dL (ref 32.0–36.0)
MCV: 85.2 fL (ref 80.0–100.0)
MPV: 10.2 fL (ref 7.5–12.5)
Monocytes Relative: 6.2 %
Neutro Abs: 2975 cells/uL (ref 1500–7800)
Neutrophils Relative %: 41.9 %
Platelets: 197 10*3/uL (ref 140–400)
RBC: 5 10*6/uL (ref 4.20–5.80)
RDW: 12.8 % (ref 11.0–15.0)
Total Lymphocyte: 47.4 %
WBC: 7.1 10*3/uL (ref 3.8–10.8)

## 2018-12-19 LAB — HEMOGLOBIN A1C
Hgb A1c MFr Bld: 7.9 % of total Hgb — ABNORMAL HIGH (ref <5.7)
Mean Plasma Glucose: 180 (calc)
eAG (mmol/L): 10 (calc)

## 2018-12-19 LAB — VITAMIN D 25 HYDROXY (VIT D DEFICIENCY, FRACTURES): Vit D, 25-Hydroxy: 43 ng/mL (ref 30–100)

## 2018-12-19 LAB — LIPID PANEL
Cholesterol: 150 mg/dL (ref <200)
HDL: 43 mg/dL (ref >=40)
LDL Cholesterol (Calc): 79 mg/dL (calc)
Non-HDL Cholesterol (Calc): 107 mg/dL (calc) (ref <130)
Total CHOL/HDL Ratio: 3.5 (calc) (ref <5.0)
Triglycerides: 190 mg/dL — ABNORMAL HIGH (ref <150)

## 2018-12-19 LAB — MAGNESIUM: Magnesium: 1.5 mg/dL (ref 1.5–2.5)

## 2018-12-19 LAB — TSH: TSH: 3.27 mIU/L (ref 0.40–4.50)

## 2019-01-09 MED FILL — EZETIMIBE 10 MG TABS: 10 | 90 days supply | Qty: 90 | Fill #4

## 2019-01-09 MED FILL — LOSARTAN POTASSIUM 50 MG TA: 50 | 90 days supply | Qty: 90 | Fill #4

## 2019-01-20 ENCOUNTER — Other Ambulatory Visit: Payer: Self-pay | Admitting: Physician Assistant

## 2019-01-20 ENCOUNTER — Telehealth: Payer: Self-pay

## 2019-01-20 DIAGNOSIS — R251 Tremor, unspecified: Secondary | ICD-10-CM

## 2019-01-20 DIAGNOSIS — K862 Cyst of pancreas: Secondary | ICD-10-CM

## 2019-01-20 MED FILL — clonazePAM 1 MG TABS: 1 | 30 days supply | Qty: 30 | Fill #0

## 2019-01-20 MED FILL — SYNJARDY XR 25-1000 MG TB24: 25-1000 | 30 days supply | Qty: 30 | Fill #1

## 2019-01-20 NOTE — Telephone Encounter (Signed)
Spoke with the pt and he agrees to the appt. He has been instructed and The pt has been advised of the information and verbalized understanding.      You have been scheduled for a CT scan of the abdomen and pelvis at [facility] ([address]---this is in the same building as Press photographer).   You are scheduled on 02/12/19 at 1030 am. You should arrive 30 minutes prior to your appointment time for registration. Please follow the written instructions below on the day of your exam:  WARNING: IF YOU ARE ALLERGIC TO IODINE/X-RAY DYE, PLEASE NOTIFY RADIOLOGY IMMEDIATELY AT [phone]! YOU WILL BE GIVEN A 13 HOUR PREMEDICATION PREP.  1) Do not eat or drink anything after 630 am (4 hours prior to your test)    You may take any medications as prescribed with a small amount of water, if necessary. If you take any of the following medications: METFORMIN, GLUCOPHAGE, GLUCOVANCE, AVANDAMET, RIOMET, FORTAMET, Perkins MET, JANUMET, GLUMETZA or METAGLIP, you MAY be asked to HOLD this medication 48 hours AFTER the exam.  The purpose of you drinking the oral contrast is to aid in the visualization of your intestinal tract. The contrast solution may cause some diarrhea. Depending on your individual set of symptoms, you may also receive an intravenous injection of x-ray contrast/dye. Plan on being at [facility] for 30 minutes or longer, depending on the type of exam you are having performed.  This test typically takes 30-45 minutes to complete.  If you have any questions regarding your exam or if you need to reschedule, you may call the CT department at [phone] between the hours of 8:00 am and 5:00 pm, Monday-Friday.  The pt also needs to have labs prior

## 2019-01-20 NOTE — Telephone Encounter (Signed)
-----   Message from Timothy Lasso, RN sent at 08/22/2018 10:26 AM EST ----- In approximately 6 months from January I want a CT abdomen with IV/p.o. 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

## 2019-02-11 ENCOUNTER — Other Ambulatory Visit (INDEPENDENT_AMBULATORY_CARE_PROVIDER_SITE_OTHER): Payer: 59

## 2019-02-11 DIAGNOSIS — K862 Cyst of pancreas: Secondary | ICD-10-CM

## 2019-02-11 LAB — CREATININE, SERUM: Creatinine, Ser: 0.95 mg/dL (ref 0.40–1.50)

## 2019-02-11 LAB — BUN: BUN: 19 mg/dL (ref 6–23)

## 2019-02-12 ENCOUNTER — Ambulatory Visit (INDEPENDENT_AMBULATORY_CARE_PROVIDER_SITE_OTHER)
Admission: RE | Admit: 2019-02-12 | Discharge: 2019-02-12 | Disposition: A | Discharge status: Home / Self Care | Payer: 59 | Source: Ambulatory Visit | Attending: Gastroenterology | Admitting: Gastroenterology

## 2019-02-12 ENCOUNTER — Other Ambulatory Visit: Payer: Self-pay

## 2019-02-12 DIAGNOSIS — K869 Disease of pancreas, unspecified: Secondary | ICD-10-CM | POA: Diagnosis not present

## 2019-02-12 DIAGNOSIS — K5730 Diverticulosis of large intestine without perforation or abscess without bleeding: Secondary | ICD-10-CM | POA: Diagnosis not present

## 2019-02-12 DIAGNOSIS — K862 Cyst of pancreas: Secondary | ICD-10-CM

## 2019-02-12 MED ORDER — IOHEXOL 350 MG/ML SOLN
100.0000 mL | Freq: Once | INTRAVENOUS | Status: AC | PRN
  Administered 2019-02-12: 100 mL via INTRAVENOUS

## 2019-02-13 ENCOUNTER — Telehealth: Payer: Self-pay | Admitting: Gastroenterology

## 2019-02-13 NOTE — Telephone Encounter (Signed)
The pt was advised that Dr Rush Landmark has not reviewed CT scan.  We will call as soon as possible with results.

## 2019-02-16 ENCOUNTER — Other Ambulatory Visit: Payer: Self-pay | Admitting: Physician Assistant

## 2019-02-16 DIAGNOSIS — R251 Tremor, unspecified: Secondary | ICD-10-CM

## 2019-02-16 MED FILL — GABAPENTIN 300 MG CAPSULE: 300 | 86 days supply | Qty: 260 | Fill #0

## 2019-02-16 MED FILL — clonazePAM 1 MG TABS: 1 | 90 days supply | Qty: 90 | Fill #0

## 2019-02-16 MED FILL — SYNJARDY XR 25-1000 MG TB24: 25-1000 | 30 days supply | Qty: 30 | Fill #1

## 2019-02-17 MED FILL — CITALOPRAM HBR 40 MG TABLET: 40 | 90 days supply | Qty: 90 | Fill #0

## 2019-02-17 MED FILL — OMEPRAZOLE 20 MG CAPSULE DR: 20 | 90 days supply | Qty: 90 | Fill #0

## 2019-03-02 ENCOUNTER — Other Ambulatory Visit: Payer: Self-pay | Admitting: Adult Health

## 2019-03-02 MED FILL — FREESTYLE LANCETS: 90 days supply | Qty: 100 | Fill #0

## 2019-03-02 MED FILL — FREESTYLE LITE TEST STRIP: 90 days supply | Qty: 100 | Fill #0

## 2019-03-10 DEATH — deceased

## 2019-03-13 MED FILL — metFORMIN HCL ER 500 MG TB2: 500 | 90 days supply | Qty: 180 | Fill #1

## 2019-03-30 MED FILL — SYNJARDY XR 25-1000 MG TB24: 25-1000 | 30 days supply | Qty: 30 | Fill #2

## 2019-04-13 ENCOUNTER — Other Ambulatory Visit: Payer: Self-pay | Admitting: Internal Medicine

## 2019-04-13 MED FILL — EZETIMIBE 10 MG TABS: 10 | 30 days supply | Qty: 30 | Fill #5

## 2019-04-13 MED FILL — ROSUVASTATIN CALCIUM 5 MG T: 5 | 90 days supply | Qty: 90 | Fill #1

## 2019-04-15 ENCOUNTER — Other Ambulatory Visit: Payer: Self-pay | Admitting: Internal Medicine

## 2019-04-15 MED FILL — LOSARTAN POTASSIUM 50 MG TA: 50 | 30 days supply | Qty: 30 | Fill #0

## 2019-04-28 ENCOUNTER — Encounter: Payer: Self-pay | Admitting: Physician Assistant

## 2019-05-05 MED FILL — SYNJARDY XR 25-1000 MG TB24: 25-1000 | 30 days supply | Qty: 30 | Fill #3

## 2019-05-05 NOTE — Progress Notes (Signed)
Complete Physical  Assessment and Plan: Diabetes mellitus without complication Discussed general issues about diabetes pathophysiology and management., Educational material distributed., Suggested low cholesterol diet., Encouraged aerobic exercise., Discussed foot care., Reminded to get yearly retinal exam. - Hemoglobin A1c - Insulin, fasting  Hyperlipidemia -continue medications, check lipids, decrease fatty foods, increase activity. zetia and low dose crestor being tolerated - Lipid panel   Essential hypertension - continue medications, DASH diet, exercise and monitor at home. Call if greater than 130/80.  - CBC with Differential/Platelet - BASIC METABOLIC PANEL WITH GFR - Hepatic function panel - TSH - Urinalysis, Routine w reflex microscopic (not at [facility]) - Microalbumin / creatinine urine ratio  Morbid Obesity Obesity with co morbidities- long discussion about weight loss, diet, and exercise - last sleep study negative  Gastroesophageal reflux disease, esophagitis presence not specified Continue PPI/H2 blocker, diet discussed  Vitamin D deficiency - Vit D  25 hydroxy (rtn osteoporosis monitoring)  History of SCC (squamous cell carcinoma) of skin Follow up DERM- needs removal- will put in referral  Varicose veins Varicose veins- weight loss discussed, continue compression stockings and elevation  Encounter for general adult medical examination with abnormal findings  Medication management - Magnesium  Anemia, unspecified anemia type - Vitamin B12  Diabetes with peripheral neuropathy Decrease sugars, monitor feet.   Insomnia Doing well on klonopin, failed gabapentin, melatonin, minipress, and requip, mirapex  Discussed med's effects and SE's. Screening labs and tests as requested with regular follow-up as recommended. Over 40 minutes of exam, counseling, chart review and critical decision making was performed  HPI Patient presents for a complete physical.   63 y.o. WM.   His blood pressure has been controlled at home, monitoring at home, today their BP is BP: 132/68   He does not workout, but stays active,working for [employer], works 3+ days a week, lots of walking, retired Psychologist, occupational.  He denies chest pain, shortness of breath, dizziness.  He is on celexa for anxiety, only on 1/2 a pill.  He is on cholesterol medication, he is on zetia and crestor, and denies myalgias. His cholesterol is not at goal less than 70. The cholesterol last visit was:   Lab Results  Component Value Date   CHOL 150 12/18/2018   HDL 43 12/18/2018   LDLCALC 79 12/18/2018   TRIG 190 (H) 12/18/2018   CHOLHDL 3.5 12/18/2018   He has been working on diet and exercise for diabetes  with CKD is on ACE/ARB With hyperlipidemia he is on crestor 47m and zetia 151mhe is on synjardy XR 25/1000 once  he is on bASA Checking sugars 113-140 Eye Exam: has one in a few weeks, gets yearly and denies paresthesia of the feet, polydipsia, polyuria and visual disturbances.  Last A1C in the office was:  Lab Results  Component Value Date   HGBA1C 7.9 (H) 12/18/2018   Lab Results  Component Value Date   GFRNONAA 74 12/18/2018   Lab Results  Component Value Date   CHOL 150 12/18/2018   HDL 43 12/18/2018   LDLCALC 79 12/18/2018   TRIG 190 (H) 12/18/2018   CHOLHDL 3.5 12/18/2018    Patient is on Vitamin D supplement, not on vitamin D.   Lab Results  Component Value Date   VD25OH 43816/05/2019     Last PSA was: Lab Results  Component Value Date   PSA 0.2 03/27/2016   BMI is Body mass index is 33.12 kg/m., he is working on diet and exercise.  Last sleep study negative, not on CPAP.  Wt Readings from Last 3 Encounters:  05/06/19 244 lb 3.2 oz (110.8 kg)  09/10/18 238 lb 9.6 oz (108.2 kg)  07/23/18 242 lb (109.8 kg)    Current Medications:  Current Outpatient Medications on File Prior to Visit  Medication Sig Dispense Refill  . aspirin 81 MG tablet Take 81 mg  by mouth daily.    . blood glucose meter kit and supplies Test sugars once dailyDispense based insurance preference. E11.9 1 each 0  . Cholecalciferol 125 MCG (5000 UT) capsule Take 5,000 Units by mouth daily.    . citalopram (CELEXA) 40 MG tablet Take 1 tablet (40 mg total) by mouth daily. 90 tablet 3  . clonazePAM (KLONOPIN) 1 MG tablet 1 tablet nightly for restless legs 90 tablet 0  . Continuous Blood Gluc Sensor (FREESTYLE LIBRE SENSOR SYSTEM) MISC Check sugars 3 x a day DX E11.65 1 each 0  . Empagliflozin-metFORMIN HCl ER (SYNJARDY XR) 25-1000 MG TB24 Take 1 tablet by mouth daily. 30 tablet 3  . FREESTYLE LITE test strip USE TO TEST BLOOD GLUCOSE ONCE A DAY 100 strip 0  . gabapentin (NEURONTIN) 300 MG capsule Take 3 capsules by mouth at night 260 capsule 0  . ibuprofen (ADVIL,MOTRIN) 200 MG tablet Take 600 mg by mouth every 6 (six) hours as needed for headache, mild pain, moderate pain or cramping.    . Lancets (FREESTYLE) lancets USE TO TEST BLOOD GLUCOSE ONCE A DAY 100 each 0  . losartan (COZAAR) 50 MG tablet TAKE 1 TABLET (50 MG TOTAL) BY MOUTH DAILY. 30 tablet 11  . Magnesium 100 MG CAPS Take 100 mg by mouth daily.     . metFORMIN (GLUCOPHAGE-XR) 500 MG 24 hr tablet Take 1 tablet (500 mg total) by mouth 2 (two) times a day. 180 tablet 3  . omeprazole (PRILOSEC) 20 MG capsule Take 1 capsule (20 mg total) by mouth daily. 90 capsule 3  . rosuvastatin (CRESTOR) 5 MG tablet Take 1 tablet (5 mg total) by mouth at bedtime. 90 tablet 3  . TURMERIC PO Take 1 capsule by mouth 2 (two) times daily.     Marland Kitchen ezetimibe (ZETIA) 10 MG tablet TAKE 1 TABLET (10 MG TOTAL) BY MOUTH DAILY. 30 tablet 11   No current facility-administered medications on file prior to visit.    Health Maintenance:  Immunization History  Administered Date(s) Administered  . Influenza-Unspecified 05/07/2013  . Pneumococcal Polysaccharide-23 06/24/2013  . Td 07/09/2006   Tetanus: 2016 at work Pneumovax: 2014 Prevnar 13:  Flu vaccine: at work Zostavax: DEXA: Colonoscopy: 05/2018 ERCP 08/2018 EGD: Sleep study 2017 Eye Exam: [facility] 2019 Dentist:  Patient Care Team: Unk Pinto, MD as PCP - General (Internal Medicine) Carol Ada, MD as Consulting Physician (Gastroenterology)  Dr. Johnsie Cancel 2007 normal stress test Dr. Kellie Simmering Dr. Benson [place] Dr. Lorin Mercy  Dr. Kristian Covey  Medical History:  Past Medical History:  Diagnosis Date  . Acute calculous cholecystitis s/p lap cholecystectopmy 05/23/2018 05/22/2018  . Cancer ([place])    skin cancer - basal cell  . Diabetes mellitus without complication ([place])   . Full dentures   . GERD (gastroesophageal reflux disease)   . Heart murmur    no problems per pt  . Hernia, abdominal   . Hyperlipidemia   . Hypertension   . OSA (obstructive sleep apnea) 08/03/2013   pt does not use CPAP  . Pneumonia    as a small child only  . Superficial  thrombophlebitis of left upper extremity 06/18/2018  . Unspecified vitamin D deficiency   . Wears glasses    Allergies Allergies  Allergen Reactions  . Caduet [Amlodipine-Atorvastatin] Hives and Itching  . Statins     BLURRED VISION    SURGICAL HISTORY He  has a past surgical history that includes orthopedic surgeries; Laser ablation; Vasectomy; Metatarsal osteotomy with bunionectomy; Cholecystectomy (N/A, 05/23/2018); Multiple tooth extractions; Upper esophageal endoscopic ultrasound (eus) (N/A, 07/23/2018); biopsy (07/23/2018); Esophagogastroduodenoscopy (egd) with propofol (N/A, 07/23/2018); Endoscopic retrograde cholangiopancreatography (ercp) with propofol (N/A, 08/13/2018); biopsy (08/13/2018); sphincterotomy (08/13/2018); and removal of stones (08/13/2018). FAMILY HISTORY His family history includes Cancer in his mother; Diabetes in his sister; Heart disease in his father; Hyperlipidemia in his father; Hypertension in his brother; Stomach cancer in his maternal grandfather. SOCIAL HISTORY He  reports that he quit smoking  about 25 years ago. He has quit using smokeless tobacco.  His smokeless tobacco use included chew. He reports that he does not drink alcohol or use drugs.  Review of Systems:  Review of Systems  Constitutional: Negative.   HENT: Negative for congestion, ear discharge, ear pain, hearing loss, nosebleeds, sore throat and tinnitus.   Eyes: Negative.   Respiratory: Negative.  Negative for cough, shortness of breath and stridor.   Cardiovascular: Positive for leg swelling. Negative for chest pain, palpitations, orthopnea, claudication and PND.  Gastrointestinal: Negative.   Genitourinary: Negative.  Negative for frequency and urgency.  Musculoskeletal: Positive for back pain and joint pain. Negative for falls, myalgias and neck pain.  Skin: Negative.   Neurological: Negative.  Negative for dizziness and headaches.  Psychiatric/Behavioral: Negative for depression, hallucinations, memory loss, substance abuse and suicidal ideas. The patient has insomnia. The patient is not nervous/anxious.     Physical Exam: Estimated body mass index is 33.12 kg/m as calculated from the following:   Height as of this encounter: 6' (1.829 m).   Weight as of this encounter: 244 lb 3.2 oz (110.8 kg). BP 132/68   Pulse 77   Temp (!) 97.5 F (36.4 C)   Ht 6' (1.829 m)   Wt 244 lb 3.2 oz (110.8 kg)   SpO2 97%   BMI 33.12 kg/m  General Appearance: Well nourished, in no apparent distress.  Eyes: PERRLA, EOMs, conjunctiva no swelling or erythema, normal fundi and vessels.  Sinuses: No Frontal/maxillary tenderness  ENT/Mouth: Ext aud canals clear, normal light reflex with TMs without erythema, bulging. Good dentition. No erythema, swelling, or exudate on post pharynx. Tonsils not swollen or erythematous. Hearing normal.  Neck: Supple, thyroid normal. No bruits  Respiratory: Respiratory effort normal, BS equal bilaterally without rales, rhonchi, wheezing or stridor.  Cardio: RRR without murmurs, rubs or gallops.  Brisk peripheral pulses without edema.  Chest: symmetric, with normal excursions and percussion.  Abdomen: Soft, nontender, no guarding, rebound, hernias, masses, or organomegaly.  Lymphatics: Non tender without lymphadenopathy.  Genitourinary: defer Musculoskeletal: Full ROM all peripheral extremities,5/5 strength, and normal gait.Bilateral varicose veins.   Skin: Warm, dry without rashes, lesions, ecchymosis. Neuro: Cranial nerves intact, reflexes equal bilaterally. Normal muscle tone, no cerebellar symptoms. Sensation decreased bilateral feet to ankle. Psych: Awake and oriented X 3, normal affect, Insight and Judgment appropriate.     EKG: WNL no changes. AORTA SCAN: defer  Vicie Mutters 3:03 PM [facility]

## 2019-05-06 ENCOUNTER — Other Ambulatory Visit: Payer: Self-pay

## 2019-05-06 ENCOUNTER — Encounter: Payer: Self-pay | Admitting: Physician Assistant

## 2019-05-06 ENCOUNTER — Ambulatory Visit: Payer: 59 | Admitting: Physician Assistant

## 2019-05-06 VITALS — BP 132/68 | HR 77 | Temp 97.5°F | Ht 72.0 in | Wt 244.2 lb

## 2019-05-06 DIAGNOSIS — N181 Chronic kidney disease, stage 1: Secondary | ICD-10-CM

## 2019-05-06 DIAGNOSIS — K869 Disease of pancreas, unspecified: Secondary | ICD-10-CM

## 2019-05-06 DIAGNOSIS — Z125 Encounter for screening for malignant neoplasm of prostate: Secondary | ICD-10-CM | POA: Diagnosis not present

## 2019-05-06 DIAGNOSIS — E559 Vitamin D deficiency, unspecified: Secondary | ICD-10-CM

## 2019-05-06 DIAGNOSIS — C4490 Unspecified malignant neoplasm of skin, unspecified: Secondary | ICD-10-CM

## 2019-05-06 DIAGNOSIS — Z0000 Encounter for general adult medical examination without abnormal findings: Secondary | ICD-10-CM | POA: Diagnosis not present

## 2019-05-06 DIAGNOSIS — Z136 Encounter for screening for cardiovascular disorders: Secondary | ICD-10-CM | POA: Diagnosis not present

## 2019-05-06 DIAGNOSIS — Z1389 Encounter for screening for other disorder: Secondary | ICD-10-CM | POA: Diagnosis not present

## 2019-05-06 DIAGNOSIS — G2581 Restless legs syndrome: Secondary | ICD-10-CM

## 2019-05-06 DIAGNOSIS — E1142 Type 2 diabetes mellitus with diabetic polyneuropathy: Secondary | ICD-10-CM

## 2019-05-06 DIAGNOSIS — Z79899 Other long term (current) drug therapy: Secondary | ICD-10-CM

## 2019-05-06 DIAGNOSIS — Z860100 Personal history of colon polyps, unspecified: Secondary | ICD-10-CM

## 2019-05-06 DIAGNOSIS — E1122 Type 2 diabetes mellitus with diabetic chronic kidney disease: Secondary | ICD-10-CM

## 2019-05-06 DIAGNOSIS — E66811 Obesity, class 1: Secondary | ICD-10-CM

## 2019-05-06 DIAGNOSIS — Z0001 Encounter for general adult medical examination with abnormal findings: Secondary | ICD-10-CM

## 2019-05-06 DIAGNOSIS — E785 Hyperlipidemia, unspecified: Secondary | ICD-10-CM

## 2019-05-06 DIAGNOSIS — I10 Essential (primary) hypertension: Secondary | ICD-10-CM | POA: Diagnosis not present

## 2019-05-06 DIAGNOSIS — E669 Obesity, unspecified: Secondary | ICD-10-CM

## 2019-05-06 NOTE — Patient Instructions (Signed)

## 2019-05-07 LAB — CBC WITH DIFFERENTIAL/PLATELET
Absolute Monocytes: 442 cells/uL (ref 200–950)
Basophils Absolute: 33 cells/uL (ref 0–200)
Basophils Relative: 0.5 %
Eosinophils Absolute: 251 cells/uL (ref 15–500)
Eosinophils Relative: 3.8 %
HCT: 42.2 % (ref 38.5–50.0)
Hemoglobin: 14.5 g/dL (ref 13.2–17.1)
Lymphs Abs: 2554 cells/uL (ref 850–3900)
MCH: 30.1 pg (ref 27.0–33.0)
MCHC: 34.4 g/dL (ref 32.0–36.0)
MCV: 87.6 fL (ref 80.0–100.0)
MPV: 11.3 fL (ref 7.5–12.5)
Monocytes Relative: 6.7 %
Neutro Abs: 3320 cells/uL (ref 1500–7800)
Neutrophils Relative %: 50.3 %
Platelets: 179 10*3/uL (ref 140–400)
RBC: 4.82 10*6/uL (ref 4.20–5.80)
RDW: 12.8 % (ref 11.0–15.0)
Total Lymphocyte: 38.7 %
WBC: 6.6 10*3/uL (ref 3.8–10.8)

## 2019-05-07 LAB — URINALYSIS, ROUTINE W REFLEX MICROSCOPIC
Bilirubin Urine: NEGATIVE
Hgb urine dipstick: NEGATIVE
Ketones, ur: NEGATIVE
Leukocytes,Ua: NEGATIVE
Nitrite: NEGATIVE
Protein, ur: NEGATIVE
Specific Gravity, Urine: 1.037 — ABNORMAL HIGH (ref 1.001–1.03)
pH: 6 (ref 5.0–8.0)

## 2019-05-07 LAB — TESTOSTERONE: Testosterone: 176 ng/dL — ABNORMAL LOW (ref 250–827)

## 2019-05-07 LAB — VITAMIN D 25 HYDROXY (VIT D DEFICIENCY, FRACTURES): Vit D, 25-Hydroxy: 35 ng/mL (ref 30–100)

## 2019-05-07 LAB — COMPLETE METABOLIC PANEL WITH GFR
AG Ratio: 1.7 (calc) (ref 1.0–2.5)
ALT: 36 U/L (ref 9–46)
AST: 28 U/L (ref 10–35)
Albumin: 4.3 g/dL (ref 3.6–5.1)
Alkaline phosphatase (APISO): 43 U/L (ref 35–144)
BUN: 16 mg/dL (ref 7–25)
CO2: 29 mmol/L (ref 20–32)
Calcium: 9.8 mg/dL (ref 8.6–10.3)
Chloride: 99 mmol/L (ref 98–110)
Creat: 0.95 mg/dL (ref 0.70–1.25)
GFR, Est African American: 98 mL/min/{1.73_m2} (ref >=60)
GFR, Est Non African American: 84 mL/min/{1.73_m2} (ref >=60)
Globulin: 2.5 g/dL (calc) (ref 1.9–3.7)
Glucose, Bld: 200 mg/dL — ABNORMAL HIGH (ref 65–99)
Potassium: 4.2 mmol/L (ref 3.5–5.3)
Sodium: 139 mmol/L (ref 135–146)
Total Bilirubin: 0.8 mg/dL (ref 0.2–1.2)
Total Protein: 6.8 g/dL (ref 6.1–8.1)

## 2019-05-07 LAB — HEMOGLOBIN A1C
Hgb A1c MFr Bld: 7.5 % of total Hgb — ABNORMAL HIGH (ref <5.7)
Mean Plasma Glucose: 169 (calc)
eAG (mmol/L): 9.3 (calc)

## 2019-05-07 LAB — MAGNESIUM: Magnesium: 1.7 mg/dL (ref 1.5–2.5)

## 2019-05-07 LAB — MICROALBUMIN / CREATININE URINE RATIO
Creatinine, Urine: 142 mg/dL (ref 20–320)
Microalb Creat Ratio: 9 mcg/mg creat (ref <30)
Microalb, Ur: 1.3 mg/dL

## 2019-05-07 LAB — LIPID PANEL
Cholesterol: 152 mg/dL (ref <200)
HDL: 38 mg/dL — ABNORMAL LOW (ref >=40)
Non-HDL Cholesterol (Calc): 114 mg/dL (calc) (ref <130)
Total CHOL/HDL Ratio: 4 (calc) (ref <5.0)
Triglycerides: 474 mg/dL — ABNORMAL HIGH (ref <150)

## 2019-05-07 LAB — PSA: PSA: 0.1 ng/mL (ref <=4.0)

## 2019-05-07 LAB — URIC ACID: Uric Acid, Serum: 5.5 mg/dL (ref 4.0–8.0)

## 2019-05-07 LAB — TSH: TSH: 3.4 mIU/L (ref 0.40–4.50)

## 2019-05-20 ENCOUNTER — Other Ambulatory Visit: Payer: Self-pay | Admitting: Physician Assistant

## 2019-05-20 DIAGNOSIS — E785 Hyperlipidemia, unspecified: Secondary | ICD-10-CM

## 2019-05-20 MED FILL — EZETIMIBE 10 MG TABS: 10 | 90 days supply | Qty: 90 | Fill #0

## 2019-05-20 MED FILL — LOSARTAN POTASSIUM 50 MG TA: 50 | 90 days supply | Qty: 90 | Fill #1

## 2019-05-22 DIAGNOSIS — L821 Other seborrheic keratosis: Secondary | ICD-10-CM | POA: Diagnosis not present

## 2019-05-22 DIAGNOSIS — C44629 Squamous cell carcinoma of skin of left upper limb, including shoulder: Secondary | ICD-10-CM | POA: Diagnosis not present

## 2019-05-22 DIAGNOSIS — D485 Neoplasm of uncertain behavior of skin: Secondary | ICD-10-CM | POA: Diagnosis not present

## 2019-05-22 DIAGNOSIS — D0471 Carcinoma in situ of skin of right lower limb, including hip: Secondary | ICD-10-CM | POA: Diagnosis not present

## 2019-05-22 DIAGNOSIS — L918 Other hypertrophic disorders of the skin: Secondary | ICD-10-CM | POA: Diagnosis not present

## 2019-05-22 DIAGNOSIS — D225 Melanocytic nevi of trunk: Secondary | ICD-10-CM | POA: Diagnosis not present

## 2019-05-22 DIAGNOSIS — L820 Inflamed seborrheic keratosis: Secondary | ICD-10-CM | POA: Diagnosis not present

## 2019-05-22 DIAGNOSIS — L570 Actinic keratosis: Secondary | ICD-10-CM | POA: Diagnosis not present

## 2019-05-22 DIAGNOSIS — Z85828 Personal history of other malignant neoplasm of skin: Secondary | ICD-10-CM | POA: Diagnosis not present

## 2019-06-03 ENCOUNTER — Other Ambulatory Visit: Payer: Self-pay | Admitting: Physician Assistant

## 2019-06-03 DIAGNOSIS — R251 Tremor, unspecified: Secondary | ICD-10-CM

## 2019-06-03 MED FILL — CITALOPRAM HBR 40 MG TABLET: 40 | 90 days supply | Qty: 90 | Fill #1

## 2019-06-03 MED FILL — OMEPRAZOLE 20 MG CAPSULE DR: 20 | 90 days supply | Qty: 90 | Fill #1

## 2019-06-03 MED FILL — clonazePAM 1 MG TABS: 1 | 90 days supply | Qty: 90 | Fill #0

## 2019-06-12 ENCOUNTER — Other Ambulatory Visit: Payer: Self-pay | Admitting: Adult Health

## 2019-06-12 MED FILL — SYNJARDY XR 25-1000 MG TB24: 25-1000 | 30 days supply | Qty: 30 | Fill #0

## 2019-06-15 DIAGNOSIS — C44629 Squamous cell carcinoma of skin of left upper limb, including shoulder: Secondary | ICD-10-CM | POA: Diagnosis not present

## 2019-06-15 DIAGNOSIS — Z85828 Personal history of other malignant neoplasm of skin: Secondary | ICD-10-CM | POA: Diagnosis not present

## 2019-06-15 MED FILL — DOXYCYCLINE HYC 100 MG CAPS: 100 | 5 days supply | Qty: 10 | Fill #0

## 2019-06-22 DIAGNOSIS — Z85828 Personal history of other malignant neoplasm of skin: Secondary | ICD-10-CM | POA: Diagnosis not present

## 2019-06-22 DIAGNOSIS — D0471 Carcinoma in situ of skin of right lower limb, including hip: Secondary | ICD-10-CM | POA: Diagnosis not present

## 2019-06-23 MED FILL — METFORMIN HCL ER 500 MG TB2: 500 | 90 days supply | Qty: 180 | Fill #2

## 2019-07-13 ENCOUNTER — Other Ambulatory Visit: Payer: Self-pay | Admitting: Physician Assistant

## 2019-07-13 MED FILL — ROSUVASTATIN CALCIUM 5 MG T: 5 | 90 days supply | Qty: 90 | Fill #2

## 2019-07-13 MED FILL — GABAPENTIN 300 MG CAPSULE: 300 | 90 days supply | Qty: 270 | Fill #0

## 2019-07-13 MED FILL — SYNJARDY XR 25-1000 MG TB24: 25-1000 | 30 days supply | Qty: 30 | Fill #1

## 2019-08-26 MED FILL — EZETIMIBE 10 MG TABS: 10 | 90 days supply | Qty: 90 | Fill #1

## 2019-08-26 MED FILL — LOSARTAN POTASSIUM 50 MG TA: 50 | 90 days supply | Qty: 90 | Fill #2

## 2019-08-26 MED FILL — SYNJARDY XR 25-1000 MG TB24: 25-1000 | 30 days supply | Qty: 30 | Fill #2

## 2019-09-07 ENCOUNTER — Other Ambulatory Visit: Payer: Self-pay | Admitting: Physician Assistant

## 2019-09-07 DIAGNOSIS — R251 Tremor, unspecified: Secondary | ICD-10-CM

## 2019-09-07 MED FILL — OMEPRAZOLE DR 20 MG CAPSULE: 20 | 90 days supply | Qty: 90 | Fill #2

## 2019-09-07 MED FILL — CITALOPRAM HBR 40 MG TABLET: 40 | 90 days supply | Qty: 90 | Fill #2

## 2019-09-08 MED FILL — clonazePAM 1 MG TABS: 1 | 90 days supply | Qty: 90 | Fill #0

## 2019-09-22 ENCOUNTER — Other Ambulatory Visit: Payer: Self-pay | Admitting: Internal Medicine

## 2019-09-22 MED FILL — SYNJARDY XR 25-1000 MG TB24: 25-1000 | 30 days supply | Qty: 30 | Fill #0

## 2019-10-09 MED FILL — METFORMIN HCL ER 500 MG TB2: 500 | 90 days supply | Qty: 180 | Fill #3

## 2019-10-15 NOTE — Progress Notes (Signed)
FOLLOW UP  Assessment and Plan: Essential hypertension - continue medications, DASH diet, exercise and monitor at home. Call if greater than 130/80.  -     CBC with Differential/Platelet -     COMPLETE METABOLIC PANEL WITH GFR -     TSH  Hyperlipidemia, unspecified hyperlipidemia type -     Lipid panel check lipids decrease fatty foods increase activity.   Diabetic peripheral neuropathy associated with type 2 diabetes mellitus (HCC) -     Hemoglobin A1c Discussed general issues about diabetes pathophysiology and management., Educational material distributed., Suggested low cholesterol diet., Encouraged aerobic exercise., Discussed foot care., Reminded to get yearly retinal exam.  Type 2 diabetes mellitus with stage 1 chronic kidney disease, without long-term current use of insulin (HCC) -     Hemoglobin A1c Discussed general issues about diabetes pathophysiology and management., Educational material distributed., Suggested low cholesterol diet., Encouraged aerobic exercise., Discussed foot care., Reminded to get yearly retinal exam.  Obesity (BMI 30.0-34.9) - follow up 3 months for progress monitoring - increase veggies, decrease carbs - long discussion about weight loss, diet, and exercise  Vitamin D deficiency -     VITAMIN D 25 Hydroxy (Vit-D Deficiency, Fractures)  Medication management -     Magnesium  Future Appointments  Date Time Provider Wade  05/09/2020  3:00 PM Vicie Mutters, PA-C [facility] None     Discussed med's effects and SE's. Screening labs and tests as requested with regular follow-up as recommended. Over 30 minutes of exam, counseling, chart review and critical decision making was performed  HPI  64 y.o. WM obese WM with history of HTN, chol, DM2, insomnia/RLS presents for OVERDUE follow up, last seen in 05/06/2019.  His blood pressure has been controlled at home, monitoring at home, today their BP is BP: 126/80  BMI is Body mass index  is 32.69 kg/m., he is working on diet and exercise. Last sleep study negative, not on CPAP.  Wt Readings from Last 3 Encounters:  10/19/19 241 lb (109.3 kg)  05/06/19 244 lb 3.2 oz (110.8 kg)  09/10/18 238 lb 9.6 oz (108.2 kg)   He does not workout, but stays active, has left knee pain, working for [employer], works 3+ days a week, lots of walking, retired Psychologist, occupational.  He denies chest pain, shortness of breath, dizziness.  He is on celexa for anxiety on 40 mg a day.   He has been working on diet and exercise for diabetes  with CKD is on ACE/ARB, Cozaar 31m With hyperlipidemia he is on crestor 523mand zetia 1057me is on synjardy XR 25/1000 once Metformin 500 2 x a day  he is on bASA Checking sugars 140-160 [facility] and denies paresthesia of the feet, polydipsia, polyuria and visual disturbances.  Last A1C in the office was:  Lab Results  Component Value Date   HGBA1C 7.5 (H) 05/06/2019   Lab Results  Component Value Date   GFRNONAA 84 05/06/2019   Lab Results  Component Value Date   CHOL 152 05/06/2019   HDL 38 (L) 05/06/2019   LDLAlorton0/28/2020     Comment:     . LDL cholesterol not calculated. Triglyceride levels greater than 400 mg/dL invalidate calculated LDL results. . Reference range: <100 . Desirable range <100 mg/dL for primary prevention;   <70 mg/dL for patients with CHD or diabetic patients  with > or = 2 CHD risk factors. . LMarland KitchenL-C is now  calculated using the Martin-Hopkins  calculation, which is a validated novel method providing  better accuracy than the Friedewald equation in the  estimation of LDL-C.  Cresenciano Genre et al. Annamaria Helling. 2423;536(14): 2061-2068  (http://education.[employer].com/faq/FAQ164)    TRIG 474 (H) 05/06/2019   CHOLHDL 4.0 05/06/2019   Patient is on Vitamin D supplement, not on vitamin D.   Lab Results  Component Value Date   VD25OH 35 05/06/2019     Current Medications:   Current  Outpatient Medications (Endocrine & Metabolic):  .  metFORMIN (GLUCOPHAGE-XR) 500 MG 24 hr tablet, Take 1 tablet (500 mg total) by mouth 2 (two) times a day. .  Empagliflozin-metFORMIN HCl ER (SYNJARDY XR) 25-1000 MG TB24, Take 25-1,000 mg by mouth daily.  Current Outpatient Medications (Cardiovascular):  .  ezetimibe (ZETIA) 10 MG tablet, Take 1 tablet Daily for Cholesterol .  losartan (COZAAR) 50 MG tablet, TAKE 1 TABLET (50 MG TOTAL) BY MOUTH DAILY. .  rosuvastatin (CRESTOR) 5 MG tablet, Take 1 tablet (5 mg total) by mouth at bedtime.   Current Outpatient Medications (Analgesics):  .  aspirin 81 MG tablet, Take 81 mg by mouth daily. Marland Kitchen  ibuprofen (ADVIL,MOTRIN) 200 MG tablet, Take 600 mg by mouth every 6 (six) hours as needed for headache, mild pain, moderate pain or cramping.   Current Outpatient Medications (Other):  .  blood glucose meter kit and supplies, Test sugars once dailyDispense based insurance preference. E11.9 .  Cholecalciferol 125 MCG (5000 UT) capsule, Take 5,000 Units by mouth daily. .  citalopram (CELEXA) 40 MG tablet, Take 1 tablet (40 mg total) by mouth daily. .  clonazePAM (KLONOPIN) 1 MG tablet, TAKE 1 TABLET BY MOUTH NIGHTLY FOR RESTLESS LEGS .  Continuous Blood Gluc Sensor (FREESTYLE LIBRE SENSOR SYSTEM) MISC, Check sugars 3 x a day DX E11.65 .  FREESTYLE LITE test strip, USE TO TEST BLOOD GLUCOSE ONCE A DAY .  gabapentin (NEURONTIN) 300 MG capsule, Take 3 capsules  at night for Chronic Pain or Sleep .  Lancets (FREESTYLE) lancets, USE TO TEST BLOOD GLUCOSE ONCE A DAY .  Magnesium 100 MG CAPS, Take 100 mg by mouth daily.  Marland Kitchen  omeprazole (PRILOSEC) 20 MG capsule, Take 1 capsule (20 mg total) by mouth daily. .  TURMERIC PO, Take 1 capsule by mouth 2 (two) times daily.    Medical History:  Past Medical History:  Diagnosis Date  . Acute calculous cholecystitis s/p lap cholecystectopmy 05/23/2018 05/22/2018  . Cancer ([place])    skin cancer - basal cell  . Diabetes  mellitus without complication ([place])   . Full dentures   . GERD (gastroesophageal reflux disease)   . Heart murmur    no problems per pt  . Hernia, abdominal   . Hyperlipidemia   . Hypertension   . OSA (obstructive sleep apnea) 08/03/2013   pt does not use CPAP  . Pneumonia    as a small child only  . Superficial thrombophlebitis of left upper extremity 06/18/2018  . Unspecified vitamin D deficiency   . Wears glasses    Allergies Allergies  Allergen Reactions  . Caduet [Amlodipine-Atorvastatin] Hives and Itching  . Statins     BLURRED VISION    Surgical History: reviewed and unchanged Family History: reviewed and unchanged Social History: reviewed and unchanged  Review of Systems:  Review of Systems  Constitutional: Negative.   HENT: Negative for congestion, ear discharge, ear pain, hearing loss, nosebleeds, sore throat and tinnitus.   Eyes: Negative.   Respiratory:  Negative.  Negative for cough, shortness of breath and stridor.   Cardiovascular: Positive for leg swelling. Negative for chest pain, palpitations, orthopnea, claudication and PND.  Gastrointestinal: Negative.   Genitourinary: Negative.  Negative for frequency and urgency.  Musculoskeletal: Positive for back pain and joint pain. Negative for falls, myalgias and neck pain.  Skin: Negative.   Neurological: Negative.  Negative for dizziness and headaches.  Psychiatric/Behavioral: Negative for depression, hallucinations, memory loss, substance abuse and suicidal ideas. The patient has insomnia. The patient is not nervous/anxious.     Physical Exam: Estimated body mass index is 32.69 kg/m as calculated from the following:   Height as of 05/06/19: 6' (1.829 m).   Weight as of this encounter: 241 lb (109.3 kg). BP 126/80   Pulse 77   Temp (!) 97.5 F (36.4 C)   Wt 241 lb (109.3 kg)   SpO2 97%   BMI 32.69 kg/m  General Appearance: Well nourished, in no apparent distress.  Eyes: PERRLA, EOMs, conjunctiva no  swelling or erythema, normal fundi and vessels.  Sinuses: No Frontal/maxillary tenderness  ENT/Mouth: Ext aud canals clear, normal light reflex with TMs without erythema, bulging. Good dentition. No erythema, swelling, or exudate on post pharynx. Tonsils not swollen or erythematous. Hearing normal.  Neck: Supple, thyroid normal. No bruits  Respiratory: Respiratory effort normal, BS equal bilaterally without rales, rhonchi, wheezing or stridor.  Cardio: RRR without murmurs, rubs or gallops. Brisk peripheral pulses without edema.  Chest: symmetric, with normal excursions and percussion.  Abdomen: Soft, nontender, no guarding, rebound, hernias, masses, or organomegaly.  Lymphatics: Non tender without lymphadenopathy.  Musculoskeletal: Full ROM all peripheral extremities,5/5 strength, and normal gait.Bilateral varicose veins.   Skin: Warm, dry without rashes, lesions, ecchymosis. Neuro: Cranial nerves intact, reflexes equal bilaterally. Normal muscle tone, no cerebellar symptoms. Sensation decreased bilateral feet to ankle. Psych: Awake and oriented X 3, normal affect, Insight and Judgment appropriate.   Vicie Mutters 11:11 AM [facility]

## 2019-10-19 ENCOUNTER — Ambulatory Visit: Payer: 59 | Admitting: Physician Assistant

## 2019-10-19 ENCOUNTER — Encounter: Payer: Self-pay | Admitting: Physician Assistant

## 2019-10-19 ENCOUNTER — Other Ambulatory Visit: Payer: Self-pay

## 2019-10-19 VITALS — BP 126/80 | HR 77 | Temp 97.5°F | Wt 241.0 lb

## 2019-10-19 DIAGNOSIS — E559 Vitamin D deficiency, unspecified: Secondary | ICD-10-CM | POA: Diagnosis not present

## 2019-10-19 DIAGNOSIS — E669 Obesity, unspecified: Secondary | ICD-10-CM

## 2019-10-19 DIAGNOSIS — I10 Essential (primary) hypertension: Secondary | ICD-10-CM | POA: Diagnosis not present

## 2019-10-19 DIAGNOSIS — E785 Hyperlipidemia, unspecified: Secondary | ICD-10-CM

## 2019-10-19 DIAGNOSIS — Z79899 Other long term (current) drug therapy: Secondary | ICD-10-CM

## 2019-10-19 DIAGNOSIS — E1122 Type 2 diabetes mellitus with diabetic chronic kidney disease: Secondary | ICD-10-CM

## 2019-10-19 DIAGNOSIS — E1142 Type 2 diabetes mellitus with diabetic polyneuropathy: Secondary | ICD-10-CM | POA: Diagnosis not present

## 2019-10-19 DIAGNOSIS — N181 Chronic kidney disease, stage 1: Secondary | ICD-10-CM | POA: Diagnosis not present

## 2019-10-19 MED ORDER — ROSUVASTATIN CALCIUM 5 MG PO TABS: 5.0000 mg | ORAL_TABLET | Freq: Every day | ORAL | 3 refills | Status: DC

## 2019-10-19 MED ORDER — SYNJARDY XR 25-1000 MG PO TB24: 25.0000 mg | ORAL_TABLET | Freq: Every day | ORAL | 1 refills | Status: DC

## 2019-10-19 MED FILL — SYNJARDY XR 25-1000 MG TB24: 25-1000 | 90 days supply | Qty: 90 | Fill #0

## 2019-10-19 MED FILL — ROSUVASTATIN CALCIUM 5 MG T: 5 | 90 days supply | Qty: 90 | Fill #0

## 2019-10-19 NOTE — Patient Instructions (Signed)
Check out  Mini habits for weight loss book  2 free apps for tracking food is myfitness pal  loseit  If you want more structured weight loss that you have to pay for, you can look into  Noom  weight watchers   Google mindful eating and here are some tips and tricks below.   Rate your hunger before you eat on a scale of 1-10, try to eat closer to a 6 or higher. And if you are at below that, why are you eating? Slow down and listen to your body.          Diabetes or even increased sugars put you at 300% increased risk of heart attack and stroke.  ALSO BEING DIABETIC YOU MAY NOT HAVE ANY PAIN WITH A HEART ATTACK.  Even worse of a chance of no pain if you are a woman.  It is very unlikely that you will have any pain with a heart attack. Likely your symptoms will be very subtle, even for very severe disease.  Your symptoms for a heart attack will likely occur when you exert your self or exercise and include: Shortness of breath Sweating Nausea Dizziness Fast or irregular heart beats Fatigue   It makes me feel better if my diabetics get their heart rate up with exercise once or twice a week and pay close attention to your body. If there is ANY change in your exercise capacity or if you have symptoms above, please STOP and call 911 or call to come to the office.   PLEASE REMEMBER:  Diabetes is preventable! Up to 68 percent of complications and morbidities among individuals with type 2 diabetes can be prevented, delayed, or effectively treated and minimized with regular visits to a health professional, appropriate monitoring and medication, and a healthy diet and lifestyle.   Here is some information to help you keep your heart healthy: Move it! - Aim for 30 mins of activity every day. Take it slowly at first. Talk to [place] before starting any new exercise program.   Lose it.  -Body Mass Index (BMI) can indicate if you need to lose weight. A healthy range is 18.5-24.9. For a BMI  calculator, go to [employer]  Waist Management -Excess abdominal fat is a risk factor for heart disease, diabetes, asthma, stroke and more. Ideal waist circumference is less than 35" for women and less than 40" for men.   Eat Right -focus on fruits, vegetables, whole grains, and meals you make yourself. Avoid foods with trans fat and high sugar/sodium content.   Snooze or Snore? - Loud snoring can be a sign of sleep apnea, a significant risk factor for high blood pressure, heart attach, stroke, and heart arrhythmias.  Kick the habit -Quit Smoking! Avoid second hand smoke. A single cigarette raises your blood pressure for 20 mins and increases the risk of heart attack and stroke for the next 24 hours.   Are Aspirin and Supplements right for you? -Add ENTERIC COATED low dose 81 mg Aspirin daily OR can do every other day if you have easy bruising to protect your heart and head. As well as to reduce risk of Colon Cancer by 20 %, Skin Cancer by 26 % , Melanoma by 46% and Pancreatic cancer by 60%  Say "No to Stress -There may be little you can do about problems that cause stress. However, techniques such as long walks, meditation, and exercise can help you manage it.   Start Now! - Make changes one  at a time and set reasonable goals to increase your likelihood of success.

## 2019-10-20 DIAGNOSIS — C4442 Squamous cell carcinoma of skin of scalp and neck: Secondary | ICD-10-CM | POA: Diagnosis not present

## 2019-10-20 DIAGNOSIS — C44629 Squamous cell carcinoma of skin of left upper limb, including shoulder: Secondary | ICD-10-CM | POA: Diagnosis not present

## 2019-10-20 DIAGNOSIS — Z85828 Personal history of other malignant neoplasm of skin: Secondary | ICD-10-CM | POA: Diagnosis not present

## 2019-10-20 DIAGNOSIS — L820 Inflamed seborrheic keratosis: Secondary | ICD-10-CM | POA: Diagnosis not present

## 2019-10-20 DIAGNOSIS — L218 Other seborrheic dermatitis: Secondary | ICD-10-CM | POA: Diagnosis not present

## 2019-10-20 DIAGNOSIS — L570 Actinic keratosis: Secondary | ICD-10-CM | POA: Diagnosis not present

## 2019-10-20 LAB — CBC WITH DIFFERENTIAL/PLATELET
Absolute Monocytes: 395 cells/uL (ref 200–950)
Basophils Absolute: 18 cells/uL (ref 0–200)
Basophils Relative: 0.3 %
Eosinophils Absolute: 189 cells/uL (ref 15–500)
Eosinophils Relative: 3.2 %
HCT: 41.2 % (ref 38.5–50.0)
Hemoglobin: 14.2 g/dL (ref 13.2–17.1)
Lymphs Abs: 2496 cells/uL (ref 850–3900)
MCH: 29.6 pg (ref 27.0–33.0)
MCHC: 34.5 g/dL (ref 32.0–36.0)
MCV: 85.8 fL (ref 80.0–100.0)
MPV: 10 fL (ref 7.5–12.5)
Monocytes Relative: 6.7 %
Neutro Abs: 2803 cells/uL (ref 1500–7800)
Neutrophils Relative %: 47.5 %
Platelets: 174 10*3/uL (ref 140–400)
RBC: 4.8 10*6/uL (ref 4.20–5.80)
RDW: 12.6 % (ref 11.0–15.0)
Total Lymphocyte: 42.3 %
WBC: 5.9 10*3/uL (ref 3.8–10.8)

## 2019-10-20 LAB — LIPID PANEL
Cholesterol: 141 mg/dL (ref <200)
HDL: 36 mg/dL — ABNORMAL LOW (ref >=40)
LDL Cholesterol (Calc): 68 mg/dL (calc)
Non-HDL Cholesterol (Calc): 105 mg/dL (calc) (ref <130)
Total CHOL/HDL Ratio: 3.9 (calc) (ref <5.0)
Triglycerides: 304 mg/dL — ABNORMAL HIGH (ref <150)

## 2019-10-20 LAB — COMPLETE METABOLIC PANEL WITH GFR
AG Ratio: 1.7 (calc) (ref 1.0–2.5)
ALT: 34 U/L (ref 9–46)
AST: 34 U/L (ref 10–35)
Albumin: 4.4 g/dL (ref 3.6–5.1)
Alkaline phosphatase (APISO): 51 U/L (ref 35–144)
BUN: 12 mg/dL (ref 7–25)
CO2: 27 mmol/L (ref 20–32)
Calcium: 9.3 mg/dL (ref 8.6–10.3)
Chloride: 103 mmol/L (ref 98–110)
Creat: 0.97 mg/dL (ref 0.70–1.25)
GFR, Est African American: 95 mL/min/{1.73_m2} (ref >=60)
GFR, Est Non African American: 82 mL/min/{1.73_m2} (ref >=60)
Globulin: 2.6 g/dL (calc) (ref 1.9–3.7)
Glucose, Bld: 204 mg/dL — ABNORMAL HIGH (ref 65–99)
Potassium: 4.3 mmol/L (ref 3.5–5.3)
Sodium: 139 mmol/L (ref 135–146)
Total Bilirubin: 0.9 mg/dL (ref 0.2–1.2)
Total Protein: 7 g/dL (ref 6.1–8.1)

## 2019-10-20 LAB — MAGNESIUM: Magnesium: 1.9 mg/dL (ref 1.5–2.5)

## 2019-10-20 LAB — VITAMIN D 25 HYDROXY (VIT D DEFICIENCY, FRACTURES): Vit D, 25-Hydroxy: 46 ng/mL (ref 30–100)

## 2019-10-20 LAB — HEMOGLOBIN A1C
Hgb A1c MFr Bld: 8.3 % of total Hgb — ABNORMAL HIGH (ref <5.7)
Mean Plasma Glucose: 192 (calc)
eAG (mmol/L): 10.6 (calc)

## 2019-10-20 LAB — TSH: TSH: 2.51 mIU/L (ref 0.40–4.50)

## 2019-11-09 DIAGNOSIS — Z85828 Personal history of other malignant neoplasm of skin: Secondary | ICD-10-CM | POA: Diagnosis not present

## 2019-11-09 DIAGNOSIS — C4442 Squamous cell carcinoma of skin of scalp and neck: Secondary | ICD-10-CM | POA: Diagnosis not present

## 2019-11-09 MED FILL — DOXYCYCLINE HYCLATE 100 MG: 100 | 10 days supply | Qty: 20 | Fill #0

## 2019-11-16 DIAGNOSIS — C44629 Squamous cell carcinoma of skin of left upper limb, including shoulder: Secondary | ICD-10-CM | POA: Diagnosis not present

## 2019-11-16 DIAGNOSIS — L988 Other specified disorders of the skin and subcutaneous tissue: Secondary | ICD-10-CM | POA: Diagnosis not present

## 2019-11-16 DIAGNOSIS — Z85828 Personal history of other malignant neoplasm of skin: Secondary | ICD-10-CM | POA: Diagnosis not present

## 2019-11-30 ENCOUNTER — Other Ambulatory Visit: Payer: Self-pay | Admitting: Physician Assistant

## 2019-11-30 DIAGNOSIS — R251 Tremor, unspecified: Secondary | ICD-10-CM

## 2019-11-30 MED FILL — GABAPENTIN 300 MG CAPSULE: 300 | 90 days supply | Qty: 270 | Fill #1

## 2019-11-30 MED FILL — CITALOPRAM HBR 40 MG TABLET: 40 | 90 days supply | Qty: 90 | Fill #0

## 2019-11-30 MED FILL — LOSARTAN POTASSIUM 50 MG TA: 50 | 90 days supply | Qty: 90 | Fill #3

## 2019-12-08 ENCOUNTER — Other Ambulatory Visit: Payer: Self-pay | Admitting: Physician Assistant

## 2019-12-08 MED FILL — OMEPRAZOLE DR 20 MG CAPSULE: 20 | 90 days supply | Qty: 90 | Fill #0

## 2019-12-08 MED FILL — clonazePAM 1 MG TABS: 1 | 90 days supply | Qty: 90 | Fill #0

## 2019-12-21 NOTE — Progress Notes (Signed)
FOLLOW UP  Assessment and Plan: Essential hypertension - continue medications, DASH diet, exercise and monitor at home. Call if greater than 130/80.  -     CBC with Differential/Platelet -     COMPLETE METABOLIC PANEL WITH GFR -     TSH  Hyperlipidemia, unspecified hyperlipidemia type -     Lipid panel check lipids decrease fatty foods increase activity.   Diabetic peripheral neuropathy associated with type 2 diabetes mellitus (HCC) -     Hemoglobin A1c Discussed general issues about diabetes pathophysiology and management., Educational material distributed., Suggested low cholesterol diet., Encouraged aerobic exercise., Discussed foot care., Reminded to get yearly retinal exam.  Type 2 diabetes mellitus with stage 1 chronic kidney disease, without long-term current use of insulin (HCC) -     Hemoglobin A1c Discussed general issues about diabetes pathophysiology and management., Educational material distributed., Suggested low cholesterol diet., Encouraged aerobic exercise., Discussed foot care., Reminded to get yearly retinal exam.  Obesity (BMI 30.0-34.9) - follow up 3 months for progress monitoring - increase veggies, decrease carbs - long discussion about weight loss, diet, and exercise  Vitamin D deficiency -     VITAMIN D 25 Hydroxy (Vit-D Deficiency, Fractures)  Medication management -     Magnesium  Future Appointments  Date Time Provider [place]  05/09/2020  3:00 PM Vicie Mutters, PA-C [facility] None     Discussed med's effects and SE's. Screening labs and tests as requested with regular follow-up as recommended. Over 30 minutes of exam, counseling, chart review and critical decision making was performed  HPI  64 y.o. WM obese WM with history of HTN, chol, DM2, insomnia/RLS presents for follow up, last A1C was worse than before.  His blood pressure has been controlled at home, monitoring at home, today their BP is BP: 122/60  BMI is Body mass index  is 32.01 kg/m., he is working on diet and exercise. Last sleep study negative, not on CPAP.  Wt Readings from Last 3 Encounters:  12/23/19 236 lb (107 kg)  10/19/19 241 lb (109.3 kg)  05/06/19 244 lb 3.2 oz (110.8 kg)   He does not workout, but stays active, has left knee pain, working for [employer], works 3+ days a week, lots of walking, retired Psychologist, occupational.  He denies chest pain, shortness of breath, dizziness.  He is on celexa for anxiety on 40 mg a day.   He has been working on diet and exercise for diabetes  with CKD is on ACE/ARB, Cozaar 71m With hyperlipidemia he is on crestor 590mand zetia 1059mat goal of 70 or less he is on synjardy XR 25/1000 once Metformin 500 2 x a day  he is on bASA Checking sugars 140-160 [facility] and denies paresthesia of the feet, polydipsia, polyuria and visual disturbances.  Last A1C in the office was:  Lab Results  Component Value Date   HGBA1C 8.3 (H) 10/19/2019   Lab Results  Component Value Date   GFRNONAA 82 10/19/2019   Lab Results  Component Value Date   CHOL 141 10/19/2019   HDL 36 (L) 10/19/2019   LDLCALC 68 10/19/2019   TRIG 304 (H) 10/19/2019   CHOLHDL 3.9 10/19/2019   Patient is on Vitamin D supplement, not on vitamin D.   Lab Results  Component Value Date   VD25OH 46 10/19/2019     Lab Results  Component Value Date   IRON 96 04/17/2018   TIBC 353  04/17/2018   FERRITIN 121 06/25/2016    Current Medications:   Current Outpatient Medications (Endocrine & Metabolic):  Marland Kitchen  Empagliflozin-metFORMIN HCl ER (SYNJARDY XR) 25-1000 MG TB24, Take 25-1,000 mg by mouth daily. .  metFORMIN (GLUCOPHAGE-XR) 500 MG 24 hr tablet, Take 1 tablet (500 mg total) by mouth 2 (two) times a day.  Current Outpatient Medications (Cardiovascular):  .  ezetimibe (ZETIA) 10 MG tablet, Take 1 tablet Daily for Cholesterol .  losartan (COZAAR) 50 MG tablet, TAKE 1 TABLET (50 MG TOTAL) BY MOUTH DAILY. .   rosuvastatin (CRESTOR) 5 MG tablet, Take 1 tablet (5 mg total) by mouth at bedtime.   Current Outpatient Medications (Analgesics):  .  aspirin 81 MG tablet, Take 81 mg by mouth daily. Marland Kitchen  ibuprofen (ADVIL,MOTRIN) 200 MG tablet, Take 600 mg by mouth every 6 (six) hours as needed for headache, mild pain, moderate pain or cramping.   Current Outpatient Medications (Other):  .  blood glucose meter kit and supplies, Test sugars once dailyDispense based insurance preference. E11.9 .  Cholecalciferol 125 MCG (5000 UT) capsule, Take 5,000 Units by mouth daily. .  citalopram (CELEXA) 40 MG tablet, TAKE 1 TABLET (40 MG TOTAL) BY MOUTH DAILY. .  clonazePAM (KLONOPIN) 1 MG tablet, TAKE 1 TABLET BY MOUTH NIGHTLY FOR RESTLESS LEGS .  Continuous Blood Gluc Sensor (FREESTYLE LIBRE SENSOR SYSTEM) MISC, Check sugars 3 x a day DX E11.65 .  FREESTYLE LITE test strip, USE TO TEST BLOOD GLUCOSE ONCE A DAY .  gabapentin (NEURONTIN) 300 MG capsule, Take 3 capsules  at night for Chronic Pain or Sleep .  Lancets (FREESTYLE) lancets, USE TO TEST BLOOD GLUCOSE ONCE A DAY .  Magnesium 100 MG CAPS, Take 100 mg by mouth daily.  Marland Kitchen  omeprazole (PRILOSEC) 20 MG capsule, TAKE 1 CAPSULE (20 MG TOTAL) BY MOUTH DAILY. Marland Kitchen  TURMERIC PO, Take 1 capsule by mouth 2 (two) times daily.    Medical History:  Past Medical History:  Diagnosis Date  . Acute calculous cholecystitis s/p lap cholecystectopmy 05/23/2018 05/22/2018  . Cancer ([place])    skin cancer - basal cell  . Diabetes mellitus without complication ([place])   . Full dentures   . GERD (gastroesophageal reflux disease)   . Heart murmur    no problems per pt  . Hernia, abdominal   . Hyperlipidemia   . Hypertension   . OSA (obstructive sleep apnea) 08/03/2013   pt does not use CPAP  . Pneumonia    as a small child only  . Superficial thrombophlebitis of left upper extremity 06/18/2018  . Unspecified vitamin D deficiency   . Wears glasses    Allergies Allergies   Allergen Reactions  . Caduet [Amlodipine-Atorvastatin] Hives and Itching  . Statins     BLURRED VISION    Surgical History: reviewed and unchanged Family History: reviewed and unchanged Social History: reviewed and unchanged  Review of Systems:  Review of Systems  Constitutional: Negative.   HENT: Negative for congestion, ear discharge, ear pain, hearing loss, nosebleeds, sore throat and tinnitus.   Eyes: Negative.   Respiratory: Negative.  Negative for cough, shortness of breath and stridor.   Cardiovascular: Positive for leg swelling. Negative for chest pain, palpitations, orthopnea, claudication and PND.  Gastrointestinal: Negative.   Genitourinary: Negative.  Negative for frequency and urgency.  Musculoskeletal: Positive for back pain and joint pain. Negative for falls, myalgias and neck pain.  Skin: Negative.   Neurological: Negative.  Negative for dizziness and headaches.  Psychiatric/Behavioral: Negative for depression, hallucinations, memory loss, substance abuse and suicidal ideas. The patient has insomnia. The patient is not nervous/anxious.     Physical Exam: Estimated body mass index is 32.01 kg/m as calculated from the following:   Height as of 05/06/19: 6' (1.829 m).   Weight as of this encounter: 236 lb (107 kg). BP 122/60   Pulse 68   Temp 97.6 F (36.4 C)   Wt 236 lb (107 kg)   SpO2 96%   BMI 32.01 kg/m  General Appearance: Well nourished, in no apparent distress.  Eyes: PERRLA, EOMs, conjunctiva no swelling or erythema, normal fundi and vessels.  Sinuses: No Frontal/maxillary tenderness  ENT/Mouth: Ext aud canals clear, normal light reflex with TMs without erythema, bulging. Good dentition. No erythema, swelling, or exudate on post pharynx. Tonsils not swollen or erythematous. Hearing normal.  Neck: Supple, thyroid normal. No bruits  Respiratory: Respiratory effort normal, BS equal bilaterally without rales, rhonchi, wheezing or stridor.  Cardio: RRR  without murmurs, rubs or gallops. Brisk peripheral pulses without edema.  Chest: symmetric, with normal excursions and percussion.  Abdomen: Soft, nontender, no guarding, rebound, hernias, masses, or organomegaly.  Lymphatics: Non tender without lymphadenopathy.  Musculoskeletal: Full ROM all peripheral extremities,5/5 strength, and normal gait.Bilateral varicose veins.   Skin: Warm, dry without rashes, lesions, ecchymosis. Neuro: Cranial nerves intact, reflexes equal bilaterally. Normal muscle tone, no cerebellar symptoms. Sensation decreased bilateral feet to ankle. Psych: Awake and oriented X 3, normal affect, Insight and Judgment appropriate.   Vicie Mutters 9:43 AM [facility]

## 2019-12-23 ENCOUNTER — Encounter: Payer: Self-pay | Admitting: Physician Assistant

## 2019-12-23 ENCOUNTER — Ambulatory Visit: Payer: 59 | Admitting: Physician Assistant

## 2019-12-23 ENCOUNTER — Other Ambulatory Visit: Payer: Self-pay

## 2019-12-23 VITALS — BP 122/60 | HR 68 | Temp 97.6°F | Wt 236.0 lb

## 2019-12-23 DIAGNOSIS — Z79899 Other long term (current) drug therapy: Secondary | ICD-10-CM | POA: Diagnosis not present

## 2019-12-23 DIAGNOSIS — Z130 Encounter for screening for diseases of the blood and blood-forming organs and certain disorders involving the immune mechanism: Secondary | ICD-10-CM

## 2019-12-23 DIAGNOSIS — N179 Acute kidney failure, unspecified: Secondary | ICD-10-CM | POA: Diagnosis not present

## 2019-12-23 DIAGNOSIS — N181 Chronic kidney disease, stage 1: Secondary | ICD-10-CM | POA: Diagnosis not present

## 2019-12-23 DIAGNOSIS — E1142 Type 2 diabetes mellitus with diabetic polyneuropathy: Secondary | ICD-10-CM | POA: Diagnosis not present

## 2019-12-23 DIAGNOSIS — E669 Obesity, unspecified: Secondary | ICD-10-CM | POA: Diagnosis not present

## 2019-12-23 DIAGNOSIS — E785 Hyperlipidemia, unspecified: Secondary | ICD-10-CM | POA: Diagnosis not present

## 2019-12-23 DIAGNOSIS — E1122 Type 2 diabetes mellitus with diabetic chronic kidney disease: Secondary | ICD-10-CM | POA: Diagnosis not present

## 2019-12-23 DIAGNOSIS — I10 Essential (primary) hypertension: Secondary | ICD-10-CM | POA: Diagnosis not present

## 2019-12-23 DIAGNOSIS — E559 Vitamin D deficiency, unspecified: Secondary | ICD-10-CM | POA: Diagnosis not present

## 2019-12-23 NOTE — Patient Instructions (Addendum)
    Bad carbs also include fruit juice, alcohol, and sweet tea. These are empty calories that do not signal to your brain that you are full.   Please remember the good carbs are still carbs which convert into sugar. So please measure them out no more than 1/2-1 cup of rice, oatmeal, pasta, and beans  Veggies are however free foods! Pile them on.   Not all fruit is created equal. Please see the list below, the fruit at the bottom is higher in sugars than the fruit at the top. Please avoid all dried fruits.    RANGE OF A1C   Your A1C is a measure of your sugar over the past 3 months and is not affected by what you have eaten over the past few days. Diabetes increases your chances of stroke and heart attack over 300 % and is the leading cause of blindness and kidney failure in the [place]. Please make sure you decrease bad carbs like white bread, white rice, potatoes, corn, soft drinks, pasta, cereals, refined sugars, sweet tea, dried fruits, and fruit juice. Good carbs are okay to eat in moderation like sweet potatoes, brown rice, whole grain pasta/bread, most fruit (except dried fruit) and you can eat as many veggies as you want.   Greater than 6.5 is considered diabetic. Between 6.4 and 5.7 is prediabetic If your A1C is less than 5.7 you are NOT diabetic.  Targets for Glucose Readings: Time of Check Target for patients WITHOUT Diabetes Target for DIABETICS  Before Meals Less than 100  less than 150  Two hours after meals Less than 200  Less than 250

## 2019-12-24 DIAGNOSIS — E785 Hyperlipidemia, unspecified: Secondary | ICD-10-CM

## 2019-12-24 LAB — CBC WITH DIFFERENTIAL/PLATELET
Absolute Monocytes: 451 cells/uL (ref 200–950)
Basophils Absolute: 18 cells/uL (ref 0–200)
Basophils Relative: 0.3 %
Eosinophils Absolute: 140 cells/uL (ref 15–500)
Eosinophils Relative: 2.3 %
HCT: 43.2 % (ref 38.5–50.0)
Hemoglobin: 14.6 g/dL (ref 13.2–17.1)
Lymphs Abs: 3129 cells/uL (ref 850–3900)
MCH: 28.8 pg (ref 27.0–33.0)
MCHC: 33.8 g/dL (ref 32.0–36.0)
MCV: 85.2 fL (ref 80.0–100.0)
MPV: 9.9 fL (ref 7.5–12.5)
Monocytes Relative: 7.4 %
Neutro Abs: 2361 cells/uL (ref 1500–7800)
Neutrophils Relative %: 38.7 %
Platelets: 190 10*3/uL (ref 140–400)
RBC: 5.07 10*6/uL (ref 4.20–5.80)
RDW: 13.1 % (ref 11.0–15.0)
Total Lymphocyte: 51.3 %
WBC: 6.1 10*3/uL (ref 3.8–10.8)

## 2019-12-24 LAB — IRON, TOTAL/TOTAL IRON BINDING CAP
%SAT: 24 % (calc) (ref 20–48)
Iron: 97 ug/dL (ref 50–180)
TIBC: 412 mcg/dL (calc) (ref 250–425)

## 2019-12-24 LAB — HEMOGLOBIN A1C
Hgb A1c MFr Bld: 7.3 % of total Hgb — ABNORMAL HIGH (ref <5.7)
Mean Plasma Glucose: 163 (calc)
eAG (mmol/L): 9 (calc)

## 2019-12-24 LAB — LIPID PANEL
Cholesterol: 243 mg/dL — ABNORMAL HIGH (ref <200)
HDL: 41 mg/dL (ref >=40)
LDL Cholesterol (Calc): 157 mg/dL (calc) — ABNORMAL HIGH
Non-HDL Cholesterol (Calc): 202 mg/dL (calc) — ABNORMAL HIGH (ref <130)
Total CHOL/HDL Ratio: 5.9 (calc) — ABNORMAL HIGH (ref <5.0)
Triglycerides: 274 mg/dL — ABNORMAL HIGH (ref <150)

## 2019-12-24 LAB — COMPLETE METABOLIC PANEL WITH GFR
AG Ratio: 1.6 (calc) (ref 1.0–2.5)
ALT: 35 U/L (ref 9–46)
AST: 33 U/L (ref 10–35)
Albumin: 4.7 g/dL (ref 3.6–5.1)
Alkaline phosphatase (APISO): 48 U/L (ref 35–144)
BUN/Creatinine Ratio: 17 (calc) (ref 6–22)
BUN: 25 mg/dL (ref 7–25)
CO2: 28 mmol/L (ref 20–32)
Calcium: 10.7 mg/dL — ABNORMAL HIGH (ref 8.6–10.3)
Chloride: 97 mmol/L — ABNORMAL LOW (ref 98–110)
Creat: 1.48 mg/dL — ABNORMAL HIGH (ref 0.70–1.25)
GFR, Est African American: 57 mL/min/{1.73_m2} — ABNORMAL LOW (ref >=60)
GFR, Est Non African American: 49 mL/min/{1.73_m2} — ABNORMAL LOW (ref >=60)
Globulin: 2.9 g/dL (calc) (ref 1.9–3.7)
Glucose, Bld: 155 mg/dL — ABNORMAL HIGH (ref 65–99)
Potassium: 4.1 mmol/L (ref 3.5–5.3)
Sodium: 135 mmol/L (ref 135–146)
Total Bilirubin: 1.2 mg/dL (ref 0.2–1.2)
Total Protein: 7.6 g/dL (ref 6.1–8.1)

## 2019-12-24 LAB — MAGNESIUM: Magnesium: 1.7 mg/dL (ref 1.5–2.5)

## 2019-12-24 LAB — VITAMIN D 25 HYDROXY (VIT D DEFICIENCY, FRACTURES): Vit D, 25-Hydroxy: 40 ng/mL (ref 30–100)

## 2019-12-24 LAB — TSH: TSH: 2.52 mIU/L (ref 0.40–4.50)

## 2019-12-24 LAB — FERRITIN: Ferritin: 64 ng/mL (ref 24–380)

## 2019-12-24 NOTE — Addendum Note (Signed)
Addended by: Vicie Mutters R on: 12/24/2019 08:42 AM   Modules accepted: Orders

## 2019-12-25 ENCOUNTER — Other Ambulatory Visit: Payer: Self-pay | Admitting: Physician Assistant

## 2019-12-25 MED ORDER — EZETIMIBE 10 MG PO TABS: ORAL_TABLET | ORAL | 1 refills | Status: DC

## 2019-12-25 MED FILL — EZETIMIBE 10 MG TABS: 10 | 90 days supply | Qty: 90 | Fill #0

## 2019-12-29 ENCOUNTER — Other Ambulatory Visit: Payer: Self-pay

## 2019-12-29 ENCOUNTER — Other Ambulatory Visit: Payer: 59

## 2019-12-29 DIAGNOSIS — N179 Acute kidney failure, unspecified: Secondary | ICD-10-CM | POA: Diagnosis not present

## 2019-12-30 LAB — COMPLETE METABOLIC PANEL WITH GFR
AG Ratio: 1.8 (calc) (ref 1.0–2.5)
ALT: 33 U/L (ref 9–46)
AST: 22 U/L (ref 10–35)
Albumin: 4.2 g/dL (ref 3.6–5.1)
Alkaline phosphatase (APISO): 38 U/L (ref 35–144)
BUN: 17 mg/dL (ref 7–25)
CO2: 27 mmol/L (ref 20–32)
Calcium: 9.3 mg/dL (ref 8.6–10.3)
Chloride: 103 mmol/L (ref 98–110)
Creat: 0.94 mg/dL (ref 0.70–1.25)
GFR, Est African American: 98 mL/min/{1.73_m2} (ref >=60)
GFR, Est Non African American: 85 mL/min/{1.73_m2} (ref >=60)
Globulin: 2.4 g/dL (calc) (ref 1.9–3.7)
Glucose, Bld: 203 mg/dL — ABNORMAL HIGH (ref 65–99)
Potassium: 4.3 mmol/L (ref 3.5–5.3)
Sodium: 139 mmol/L (ref 135–146)
Total Bilirubin: 0.7 mg/dL (ref 0.2–1.2)
Total Protein: 6.6 g/dL (ref 6.1–8.1)

## 2020-01-04 ENCOUNTER — Other Ambulatory Visit: Payer: Self-pay | Admitting: Physician Assistant

## 2020-01-04 ENCOUNTER — Other Ambulatory Visit: Payer: 59

## 2020-01-04 DIAGNOSIS — E1142 Type 2 diabetes mellitus with diabetic polyneuropathy: Secondary | ICD-10-CM

## 2020-01-04 MED FILL — METFORMIN HCL ER 500 MG TB2: 500 | 90 days supply | Qty: 180 | Fill #0

## 2020-01-07 DIAGNOSIS — L918 Other hypertrophic disorders of the skin: Secondary | ICD-10-CM | POA: Diagnosis not present

## 2020-01-07 DIAGNOSIS — L570 Actinic keratosis: Secondary | ICD-10-CM | POA: Diagnosis not present

## 2020-01-07 DIAGNOSIS — Z85828 Personal history of other malignant neoplasm of skin: Secondary | ICD-10-CM | POA: Diagnosis not present

## 2020-01-21 ENCOUNTER — Ambulatory Visit: Payer: 59 | Admitting: Physician Assistant

## 2020-01-22 NOTE — Progress Notes (Signed)
FOLLOW UP  Assessment and Plan: Diagnoses and all orders for this visit:  Type 2 diabetes mellitus with stage 1 chronic kidney disease, without long-term current use of insulin (HCC) -     Lancets (FREESTYLE) lancets; USE TO TEST BLOOD GLUCOSE ONCE A DAY -     glucose blood (FREESTYLE LITE) test strip; USE TO TEST BLOOD GLUCOSE TWICE A DAY FOR HYERPGLYCEMIA -     CBC with Differential/Platelet -     COMPLETE METABOLIC PANEL WITH GFR -     Fructosamine  Essential hypertension -     CBC with Differential/Platelet -     COMPLETE METABOLIC PANEL WITH GFR - continue medications, DASH diet, exercise and monitor at home. Call if greater than 130/80.   Hyperlipidemia, unspecified hyperlipidemia type -     Lipid panel - recheck on his medications  Diabetic peripheral neuropathy associated with type 2 diabetes mellitus ([place]) monitor  Morbid obesity ([place]) - follow up 3 months for progress monitoring - increase veggies, decrease carbs - long discussion about weight loss, diet, and exercise   Future Appointments  Date Time Provider [place]  05/09/2020  3:00 PM Vicie Mutters, PA-C [facility] None     Discussed med's effects and SE's. Screening labs and tests as requested with regular follow-up as recommended. Over 30 minutes of exam, counseling, chart review and critical decision making was performed  HPI  64 y.o. WM obese WM with history of HTN, chol, DM2, insomnia/RLS presents for follow up, last A1C was worse than before.  He is working on making better food choices while he is working. He is doing less sweet tea. He is doing more chicken, he doing smaller bread. HE is increasing veggies.   His blood pressure has been controlled at home, monitoring at home, today their BP is BP: 122/82  BMI is Body mass index is 32.82 kg/m., he is working on diet and exercise. Last sleep study negative, not on CPAP.  Wt Readings from Last 3 Encounters:  01/25/20 242 lb (109.8 kg)   12/23/19 236 lb (107 kg)  10/19/19 241 lb (109.3 kg)   He does not workout, but stays active, has left knee pain, working for [employer], works 3+ days a week, lots of walking, retired Psychologist, occupational.  He denies chest pain, shortness of breath, dizziness.   He has been working on diet and exercise for diabetes  with CKD is on ACE/ARB, Cozaar 75m With hyperlipidemia he is on crestor 536mand zetia 1084mat goal of 70 or less he is on synjardy XR 25/1000 once Metformin 500 2 x a day  he is on bASA Checking sugars 120-140 on average, highest is 161.  [facility] and denies paresthesia of the feet, polydipsia, polyuria and visual disturbances.  Last A1C in the office was:  Lab Results  Component Value Date   HGBA1C 7.3 (H) 12/23/2019   Lab Results  Component Value Date   GFRNONAA 85 12/29/2019   Lab Results  Component Value Date   CHOL 243 (H) 12/23/2019   HDL 41 12/23/2019   LDLCALC 157 (H) 12/23/2019   TRIG 274 (H) 12/23/2019   CHOLHDL 5.9 (H) 12/23/2019    Current Medications:   Current Outpatient Medications (Endocrine & Metabolic):  .  Marland Kitchenmpagliflozin-metFORMIN HCl ER (SYNJARDY XR) 25-1000 MG TB24, Take 25-1,000 mg by mouth daily. .  metFORMIN (GLUCOPHAGE-XR) 500 MG 24 hr tablet, TAKE 1 TABLET BY MOUTH 2 TIMES A DAY.  Current Outpatient Medications (Cardiovascular):  .  ezetimibe (ZETIA) 10 MG tablet, Take 1 tablet Daily for Cholesterol .  losartan (COZAAR) 50 MG tablet, TAKE 1 TABLET (50 MG TOTAL) BY MOUTH DAILY.   Current Outpatient Medications (Analgesics):  .  aspirin 81 MG tablet, Take 81 mg by mouth daily. Marland Kitchen  ibuprofen (ADVIL,MOTRIN) 200 MG tablet, Take 600 mg by mouth every 6 (six) hours as needed for headache, mild pain, moderate pain or cramping.   Current Outpatient Medications (Other):  .  blood glucose meter kit and supplies, Test sugars once dailyDispense based insurance preference. E11.9 .  Cholecalciferol 125 MCG (5000  UT) capsule, Take 5,000 Units by mouth daily. .  citalopram (CELEXA) 40 MG tablet, TAKE 1 TABLET (40 MG TOTAL) BY MOUTH DAILY. .  clonazePAM (KLONOPIN) 1 MG tablet, TAKE 1 TABLET BY MOUTH NIGHTLY FOR RESTLESS LEGS .  Continuous Blood Gluc Sensor (FREESTYLE LIBRE SENSOR SYSTEM) MISC, Check sugars 3 x a day DX E11.65 .  FREESTYLE LITE test strip, USE TO TEST BLOOD GLUCOSE ONCE A DAY .  gabapentin (NEURONTIN) 300 MG capsule, Take 3 capsules  at night for Chronic Pain or Sleep .  Lancets (FREESTYLE) lancets, USE TO TEST BLOOD GLUCOSE ONCE A DAY .  Magnesium 100 MG CAPS, Take 100 mg by mouth daily.  Marland Kitchen  omeprazole (PRILOSEC) 20 MG capsule, TAKE 1 CAPSULE (20 MG TOTAL) BY MOUTH DAILY. Marland Kitchen  TURMERIC PO, Take 1 capsule by mouth 2 (two) times daily.    Medical History:  Past Medical History:  Diagnosis Date  . Acute calculous cholecystitis s/p lap cholecystectopmy 05/23/2018 05/22/2018  . Cancer ([place])    skin cancer - basal cell  . Diabetes mellitus without complication ([place])   . Full dentures   . GERD (gastroesophageal reflux disease)   . Heart murmur    no problems per pt  . Hernia, abdominal   . Hyperlipidemia   . Hypertension   . OSA (obstructive sleep apnea) 08/03/2013   pt does not use CPAP  . Pneumonia    as a small child only  . Superficial thrombophlebitis of left upper extremity 06/18/2018  . Unspecified vitamin D deficiency   . Wears glasses    Allergies Allergies  Allergen Reactions  . Caduet [Amlodipine-Atorvastatin] Hives and Itching  . Statins     BLURRED VISION    Surgical History: reviewed and unchanged Family History: reviewed and unchanged Social History: reviewed and unchanged  Review of Systems:  Review of Systems  Constitutional: Negative.   HENT: Negative for congestion, ear discharge, ear pain, hearing loss, nosebleeds, sore throat and tinnitus.   Eyes: Negative.   Respiratory: Negative.  Negative for cough, shortness of breath and stridor.    Cardiovascular: Positive for leg swelling. Negative for chest pain, palpitations, orthopnea, claudication and PND.  Gastrointestinal: Negative.   Genitourinary: Negative.  Negative for frequency and urgency.  Musculoskeletal: Positive for back pain and joint pain. Negative for falls, myalgias and neck pain.  Skin: Negative.   Neurological: Negative.  Negative for dizziness and headaches.  Psychiatric/Behavioral: Negative for depression, hallucinations, memory loss, substance abuse and suicidal ideas. The patient has insomnia. The patient is not nervous/anxious.     Physical Exam: Estimated body mass index is 32.82 kg/m as calculated from the following:   Height as of 05/06/19: 6' (1.829 m).   Weight as of this encounter: 242 lb (109.8 kg). BP 122/82   Pulse 74   Temp 97.6 F (36.4 C)   Wt  242 lb (109.8 kg)   SpO2 98%   BMI 32.82 kg/m  General Appearance: Well nourished, in no apparent distress.  Eyes: PERRLA, EOMs, conjunctiva no swelling or erythema, normal fundi and vessels.  Sinuses: No Frontal/maxillary tenderness  ENT/Mouth: Ext aud canals clear, normal light reflex with TMs without erythema, bulging. Good dentition. No erythema, swelling, or exudate on post pharynx. Tonsils not swollen or erythematous. Hearing normal.  Neck: Supple, thyroid normal. No bruits  Respiratory: Respiratory effort normal, BS equal bilaterally without rales, rhonchi, wheezing or stridor.  Cardio: RRR without murmurs, rubs or gallops. Brisk peripheral pulses without edema.  Chest: symmetric, with normal excursions and percussion.  Abdomen: Soft, nontender, no guarding, rebound, hernias, masses, or organomegaly.  Lymphatics: Non tender without lymphadenopathy.  Musculoskeletal: Full ROM all peripheral extremities,5/5 strength, and normal gait.Bilateral varicose veins.   Skin: Warm, dry without rashes, lesions, ecchymosis. Neuro: Cranial nerves intact, reflexes equal bilaterally. Normal muscle tone, no  cerebellar symptoms. Sensation decreased bilateral feet to ankle. Psych: Awake and oriented X 3, normal affect, Insight and Judgment appropriate.   Vicie Mutters 9:50 AM [facility]

## 2020-01-25 ENCOUNTER — Ambulatory Visit: Payer: 59 | Admitting: Physician Assistant

## 2020-01-25 ENCOUNTER — Encounter: Payer: Self-pay | Admitting: Physician Assistant

## 2020-01-25 ENCOUNTER — Other Ambulatory Visit: Payer: Self-pay

## 2020-01-25 VITALS — BP 122/82 | HR 74 | Temp 97.6°F | Wt 242.0 lb

## 2020-01-25 DIAGNOSIS — I10 Essential (primary) hypertension: Secondary | ICD-10-CM | POA: Diagnosis not present

## 2020-01-25 DIAGNOSIS — G4733 Obstructive sleep apnea (adult) (pediatric): Secondary | ICD-10-CM | POA: Diagnosis not present

## 2020-01-25 DIAGNOSIS — E785 Hyperlipidemia, unspecified: Secondary | ICD-10-CM | POA: Diagnosis not present

## 2020-01-25 DIAGNOSIS — E1122 Type 2 diabetes mellitus with diabetic chronic kidney disease: Secondary | ICD-10-CM | POA: Diagnosis not present

## 2020-01-25 DIAGNOSIS — E559 Vitamin D deficiency, unspecified: Secondary | ICD-10-CM

## 2020-01-25 DIAGNOSIS — N181 Chronic kidney disease, stage 1: Secondary | ICD-10-CM

## 2020-01-25 DIAGNOSIS — E1142 Type 2 diabetes mellitus with diabetic polyneuropathy: Secondary | ICD-10-CM | POA: Diagnosis not present

## 2020-01-25 DIAGNOSIS — Z9989 Dependence on other enabling machines and devices: Secondary | ICD-10-CM

## 2020-01-25 MED ORDER — FREESTYLE LITE TEST VI STRP: ORAL_STRIP | 0 refills | Status: DC

## 2020-01-25 MED ORDER — FREESTYLE LANCETS MISC: 0 refills | Status: DC

## 2020-01-25 MED FILL — FREESTYLE LITE TEST STRIP: 50 days supply | Qty: 100 | Fill #0

## 2020-01-25 MED FILL — FREESTYLE LANCETS: 90 days supply | Qty: 100 | Fill #0

## 2020-01-25 NOTE — Patient Instructions (Signed)
Use a dropper or use a cap to put peroxide, olive oil,mineral oil or canola oil in the effected ear- 2-3 times a week. Let it soak for 20-30 min then you can take a shower or use a baby bulb with warm water to wash out the ear wax.  Can buy debrox wax removal kit over the counter.  Do not use Qtips  Can do Qtip with hydrocortisone on it and use just on the outside of your ear.   We are starting you on Metformin to prevent or treat diabetes.  Metformin does NOT cause low blood sugars.   In order to create energy your cells need insulin and sugar but sometime your cells do not accept the insulin and this can cause increased sugars and decreased energy.   The Metformin helps your cells accept insulin and the sugar this help: 1) increase your energy  2) weight loss.    The two most common side effects are nausea and diarrhea, follow these rules to avoid it but these symptoms get better with time on the medication.    ALSO You can take imodium per box instructions when starting metformin if needed.   Rules of metformin: 1) start out slow with only one pill daily. Our goal for you is 4 pills a day or 204m total.  2) take with your largest meal. 3) Take with least amount of carbs.   Call if you have any problems.   General eating tips  What to Avoid . Avoid added sugars o Often added sugar can be found in processed foods such as many condiments, dry cereals, cakes, cookies, chips, crisps, crackers, candies, sweetened drinks, etc.  o Read labels and AVOID/DECREASE use of foods with the following in their ingredient list: Sugar, fructose, high fructose corn syrup, sucrose, glucose, maltose, dextrose, molasses, cane sugar, brown sugar, any type of syrup, agave nectar, etc.   . Avoid snacking in between meals- drink water or if you feel you need a snack, pick a high water content snack such as cucumbers, watermelon, or any veggie.  .Marland KitchenAvoid foods made with flour o If you are going to eat food  made with flour, choose those made with whole-grains; and, minimize your consumption as much as is tolerable . Avoid processed foods o These foods are generally stocked in the middle of the grocery store.  o Focus on shopping on the perimeter of the grocery.  What to Include . Vegetables o GREEN LEAFY VEGETABLES: Kale, spinach, mustard greens, collard greens, cabbage, broccoli, etc. o OTHER: Asparagus, cauliflower, eggplant, carrots, peas, Brussel sprouts, tomatoes, bell peppers, zucchini, beets, cucumbers, etc. . Grains, seeds, and legumes o Beans: kidney beans, black eyed peas, garbanzo beans, black beans, pinto beans, etc. o Whole, unrefined grains: brown rice, barley, bulgur, oatmeal, etc. . Healthy fats  o Avoid highly processed fats such as vegetable oil o Examples of healthy fats: avocado, olives, virgin olive oil, dark chocolate (?72% Cocoa), nuts (peanuts, almonds, walnuts, cashews, pecans, etc.) o Please still do small amount of these healthy fats, they are dense in calories.  . Low - Moderate Intake of Animal Sources of Protein o Meat sources: chicken, t[place] salmon, tuna. Limit to 4 ounces of meat at one time or the size of your palm. o Consider limiting dairy sources, but when choosing dairy focus on: PLAIN G[place]yogurt, cottage cheese, high-protein milk . Fruit o Choose berries

## 2020-01-26 MED FILL — SYNJARDY XR 25-1000 MG TB24: 25-1000 | 90 days supply | Qty: 90 | Fill #1

## 2020-01-28 LAB — CBC WITH DIFFERENTIAL/PLATELET
Absolute Monocytes: 339 cells/uL (ref 200–950)
Basophils Absolute: 32 cells/uL (ref 0–200)
Basophils Relative: 0.6 %
Eosinophils Absolute: 180 cells/uL (ref 15–500)
Eosinophils Relative: 3.4 %
HCT: 43.1 % (ref 38.5–50.0)
Hemoglobin: 14.7 g/dL (ref 13.2–17.1)
Lymphs Abs: 2406 cells/uL (ref 850–3900)
MCH: 29.1 pg (ref 27.0–33.0)
MCHC: 34.1 g/dL (ref 32.0–36.0)
MCV: 85.2 fL (ref 80.0–100.0)
MPV: 10 fL (ref 7.5–12.5)
Monocytes Relative: 6.4 %
Neutro Abs: 2343 cells/uL (ref 1500–7800)
Neutrophils Relative %: 44.2 %
Platelets: 169 10*3/uL (ref 140–400)
RBC: 5.06 10*6/uL (ref 4.20–5.80)
RDW: 13.3 % (ref 11.0–15.0)
Total Lymphocyte: 45.4 %
WBC: 5.3 10*3/uL (ref 3.8–10.8)

## 2020-01-28 LAB — COMPLETE METABOLIC PANEL WITH GFR
AG Ratio: 1.7 (calc) (ref 1.0–2.5)
ALT: 32 U/L (ref 9–46)
AST: 25 U/L (ref 10–35)
Albumin: 4.6 g/dL (ref 3.6–5.1)
Alkaline phosphatase (APISO): 43 U/L (ref 35–144)
BUN: 11 mg/dL (ref 7–25)
CO2: 28 mmol/L (ref 20–32)
Calcium: 9.4 mg/dL (ref 8.6–10.3)
Chloride: 102 mmol/L (ref 98–110)
Creat: 1.05 mg/dL (ref 0.70–1.25)
GFR, Est African American: 86 mL/min/{1.73_m2} (ref >=60)
GFR, Est Non African American: 74 mL/min/{1.73_m2} (ref >=60)
Globulin: 2.7 g/dL (calc) (ref 1.9–3.7)
Glucose, Bld: 142 mg/dL — ABNORMAL HIGH (ref 65–99)
Potassium: 4.5 mmol/L (ref 3.5–5.3)
Sodium: 138 mmol/L (ref 135–146)
Total Bilirubin: 0.9 mg/dL (ref 0.2–1.2)
Total Protein: 7.3 g/dL (ref 6.1–8.1)

## 2020-01-28 LAB — LIPID PANEL
Cholesterol: 209 mg/dL — ABNORMAL HIGH (ref <200)
HDL: 39 mg/dL — ABNORMAL LOW (ref >=40)
LDL Cholesterol (Calc): 126 mg/dL (calc) — ABNORMAL HIGH
Non-HDL Cholesterol (Calc): 170 mg/dL (calc) — ABNORMAL HIGH (ref <130)
Total CHOL/HDL Ratio: 5.4 (calc) — ABNORMAL HIGH (ref <5.0)
Triglycerides: 309 mg/dL — ABNORMAL HIGH (ref <150)

## 2020-01-28 LAB — FRUCTOSAMINE: Fructosamine: 298 umol/L — ABNORMAL HIGH (ref 205–285)

## 2020-03-04 ENCOUNTER — Other Ambulatory Visit: Payer: Self-pay | Admitting: Physician Assistant

## 2020-03-04 DIAGNOSIS — R251 Tremor, unspecified: Secondary | ICD-10-CM

## 2020-03-04 MED FILL — CITALOPRAM HBR 40 MG TABLET: 40 | 90 days supply | Qty: 90 | Fill #1

## 2020-03-04 MED FILL — OMEPRAZOLE DR 20 MG CAPSULE: 20 | 90 days supply | Qty: 90 | Fill #1

## 2020-03-04 MED FILL — LOSARTAN POTASSIUM 50 MG TA: 50 | 60 days supply | Qty: 60 | Fill #4

## 2020-03-08 MED FILL — clonazePAM 1 MG TABS: 1 | 90 days supply | Qty: 90 | Fill #0

## 2020-04-07 MED FILL — METFORMIN HCL ER 500 MG TB2: 500 | 90 days supply | Qty: 180 | Fill #1

## 2020-04-13 ENCOUNTER — Other Ambulatory Visit: Payer: Self-pay | Admitting: Adult Health Nurse Practitioner

## 2020-04-13 ENCOUNTER — Other Ambulatory Visit: Payer: Self-pay | Admitting: Physician Assistant

## 2020-04-13 ENCOUNTER — Other Ambulatory Visit: Payer: Self-pay | Admitting: Internal Medicine

## 2020-04-13 MED FILL — SYNJARDY XR 25-1000 MG TB24: 25-1000 | 90 days supply | Qty: 90 | Fill #0

## 2020-04-13 MED FILL — GABAPENTIN 300 MG CAPSULE: 300 | 90 days supply | Qty: 270 | Fill #0

## 2020-05-05 MED FILL — EZETIMIBE 10 MG TABS: 10 | 90 days supply | Qty: 90 | Fill #1

## 2020-05-09 ENCOUNTER — Other Ambulatory Visit: Payer: Self-pay

## 2020-05-09 ENCOUNTER — Encounter: Payer: Self-pay | Admitting: Adult Health Nurse Practitioner

## 2020-05-09 ENCOUNTER — Ambulatory Visit: Payer: 59 | Admitting: Adult Health Nurse Practitioner

## 2020-05-09 VITALS — BP 140/82 | HR 72 | Temp 98.1°F | Ht 72.5 in | Wt 241.6 lb

## 2020-05-09 DIAGNOSIS — Z130 Encounter for screening for diseases of the blood and blood-forming organs and certain disorders involving the immune mechanism: Secondary | ICD-10-CM | POA: Diagnosis not present

## 2020-05-09 DIAGNOSIS — G4733 Obstructive sleep apnea (adult) (pediatric): Secondary | ICD-10-CM

## 2020-05-09 DIAGNOSIS — Z0001 Encounter for general adult medical examination with abnormal findings: Secondary | ICD-10-CM

## 2020-05-09 DIAGNOSIS — E1122 Type 2 diabetes mellitus with diabetic chronic kidney disease: Secondary | ICD-10-CM | POA: Diagnosis not present

## 2020-05-09 DIAGNOSIS — Z1389 Encounter for screening for other disorder: Secondary | ICD-10-CM | POA: Diagnosis not present

## 2020-05-09 DIAGNOSIS — F5101 Primary insomnia: Secondary | ICD-10-CM

## 2020-05-09 DIAGNOSIS — Z136 Encounter for screening for cardiovascular disorders: Secondary | ICD-10-CM

## 2020-05-09 DIAGNOSIS — N181 Chronic kidney disease, stage 1: Secondary | ICD-10-CM

## 2020-05-09 DIAGNOSIS — E785 Hyperlipidemia, unspecified: Secondary | ICD-10-CM | POA: Diagnosis not present

## 2020-05-09 DIAGNOSIS — Z1321 Encounter for screening for nutritional disorder: Secondary | ICD-10-CM

## 2020-05-09 DIAGNOSIS — Z79899 Other long term (current) drug therapy: Secondary | ICD-10-CM

## 2020-05-09 DIAGNOSIS — E559 Vitamin D deficiency, unspecified: Secondary | ICD-10-CM

## 2020-05-09 DIAGNOSIS — E1142 Type 2 diabetes mellitus with diabetic polyneuropathy: Secondary | ICD-10-CM

## 2020-05-09 DIAGNOSIS — Z0000 Encounter for general adult medical examination without abnormal findings: Secondary | ICD-10-CM

## 2020-05-09 DIAGNOSIS — I10 Essential (primary) hypertension: Secondary | ICD-10-CM | POA: Diagnosis not present

## 2020-05-09 DIAGNOSIS — F411 Generalized anxiety disorder: Secondary | ICD-10-CM

## 2020-05-09 DIAGNOSIS — I8393 Asymptomatic varicose veins of bilateral lower extremities: Secondary | ICD-10-CM

## 2020-05-09 MED ORDER — ROSUVASTATIN CALCIUM 5 MG PO TABS: 5.0000 mg | ORAL_TABLET | Freq: Every day | ORAL | 1 refills | Status: DC

## 2020-05-09 MED ORDER — LOSARTAN POTASSIUM 50 MG PO TABS: 50.0000 mg | ORAL_TABLET | Freq: Every day | ORAL | 11 refills | Status: DC

## 2020-05-09 MED FILL — FREESTYLE LIBRE 14 DAY SENS: 14 days supply | Qty: 1 | Fill #0

## 2020-05-09 NOTE — Patient Instructions (Addendum)
Try changing the timing of your  Metformin.  Continue to take the Synjardy XR one tablet with breakfast.  Take Metformin 500mg  one tablet at lunch and one tablet with dinner.  IF you continue to have diarrhea change the metformin to both tablets with dinner.   We provided a hand written prescription for your freestyle libre.  Let us know if you need refill for this.   Below is general information about the Shingles vaccination.   Ask insurance and pharmacy about shingrix - it is a 2 part shot that we will not be getting in the office.   Suggest getting AFTER covid vaccines, have to wait at least a month This shot can make you feel bad due to such good immune response it can trigger some inflammation so take tylenol or aleve day of or day after and plan on resting.   Can go to [email or web address] for more information  Shingrix Vaccination  Two vaccines are licensed and recommended to prevent shingles in the [place] Zoster vaccine live (ZVL, Zostavax) has been in use since 2006. Recombinant zoster vaccine (RZV, Shingrix), has been in use since 2017 and is recommended by [employer] as the preferred shingles vaccine.  What Everyone Should Know about Shingles Vaccine (Shingrix) One of the Recommended Vaccines by Disease Shingles vaccination is the only way to protect against shingles and postherpetic neuralgia (PHN), the most common complication from shingles. CDC recommends that healthy adults 50 years and older get two doses of the shingles vaccine called Shingrix (recombinant zoster vaccine), separated by 2 to 6 months, to prevent shingles and the complications from the disease. Your doctor or pharmacist can give you Shingrix as a shot in your upper arm. Shingrix provides strong protection against shingles and PHN. Two doses of Shingrix is more than 90% effective at preventing shingles and PHN. Protection stays above 85% for at least the first four  years after you get vaccinated. Shingrix is the preferred vaccine, over Zostavax (zoster vaccine live), a shingles vaccine in use since 2006. Zostavax may still be used to prevent shingles in healthy adults 60 years and older. For example, you could use Zostavax if a person is allergic to Shingrix, prefers Zostavax, or requests immediate vaccination and Shingrix is unavailable. Who Should Get Shingrix? Healthy adults 50 years and older should get two doses of Shingrix, separated by 2 to 6 months. You should get Shingrix even if in the past you . had shingles  . received Zostavax  . are not sure if you had chickenpox There is no maximum age for getting Shingrix. If you had shingles in the past, you can get Shingrix to help prevent future occurrences of the disease. There is no specific length of time that you need to wait after having shingles before you can receive Shingrix, but generally you should make sure the shingles rash has gone away before getting vaccinated. You can get Shingrix whether or not you remember having had chickenpox in the past. Studies show that more than 99% of Americans 40 years and older have had chickenpox, even if they don't remember having the disease. Chickenpox and shingles are related because they are caused by the same virus (varicella zoster virus). After a person recovers from chickenpox, the virus stays dormant (inactive) in the body. It can reactivate years later and cause shingles. If you had Zostavax in the recent past, you should wait at least eight weeks before getting Shingrix. Talk to your healthcare provider to determine the best  time to get Shingrix. Shingrix is available in [facility] and pharmacies. To find doctor's offices or pharmacies near you that offer the vaccine, visit [facility] Vaccine FinderExternal. If you have questions about Shingrix, talk with your healthcare provider. Vaccine for Those 51 Years and Older  Shingrix reduces the risk of  shingles and PHN by more than 90% in people 57 and older. CDC recommends the vaccine for healthy adults 25 and older.  Who Should Not Get Shingrix? You should not get Shingrix if you: . have ever had a severe allergic reaction to any component of the vaccine or after a dose of Shingrix  . tested negative for immunity to varicella zoster virus. If you test negative, you should get chickenpox vaccine.  . currently have shingles  . currently are pregnant or breastfeeding. Women who are pregnant or breastfeeding should wait to get Shingrix.  Marland Kitchen receive specific antiviral drugs (acyclovir, famciclovir, or valacyclovir) 24 hours before vaccination (avoid use of these antiviral drugs for 14 days after vaccination)- zoster vaccine live only If you have a minor acute (starts suddenly) illness, such as a cold, you may get Shingrix. But if you have a moderate or severe acute illness, you should usually wait until you recover before getting the vaccine. This includes anyone with a temperature of 101.64F or higher. The side effects of the Shingrix are temporary, and usually last 2 to 3 days. While you may experience pain for a few days after getting Shingrix, the pain will be less severe than having shingles and the complications from the disease. How Well Does Shingrix Work? Two doses of Shingrix provides strong protection against shingles and postherpetic neuralgia (PHN), the most common complication of shingles. . In adults 26 to 65 years old who got two doses, Shingrix was 97% effective in preventing shingles; among adults 70 years and older, Shingrix was 91% effective.  . In adults 59 to 65 years old who got two doses, Shingrix was 91% effective in preventing PHN; among adults 70 years and older, Shingrix was 89% effective. Shingrix protection remained high (more than 85%) in people 70 years and older throughout the four years following vaccination. Since your risk of shingles and PHN increases as you get  older, it is important to have strong protection against shingles in your older years. Top of Page  What Are the Possible Side Effects of Shingrix? Studies show that Shingrix is safe. The vaccine helps your body create a strong defense against shingles. As a result, you are likely to have temporary side effects from getting the shots. The side effects may affect your ability to do normal daily activities for 2 to 3 days. Most people got a sore arm with mild or moderate pain after getting Shingrix, and some also had redness and swelling where they got the shot. Some people felt tired, had muscle pain, a headache, shivering, fever, stomach pain, or nausea. About 1 out of 6 people who got Shingrix experienced side effects that prevented them from doing regular activities. Symptoms went away on their own in about 2 to 3 days. Side effects were more common in younger people. You might have a reaction to the first or second dose of Shingrix, or both doses. If you experience side effects, you may choose to take over-the-counter pain medicine such as ibuprofen or acetaminophen. If you experience side effects from Shingrix, you should report them to the Vaccine Adverse Event Reporting System (VAERS). Your doctor might file this report, or you can  do it yourself through the VAERS websiteExternal, or by calling [phone]. If you have any questions about side effects from Shingrix, talk with your doctor. The shingles vaccine does not contain thimerosal (a preservative containing mercury). Top of Page  When Should I See a Doctor Because of the Side Effects I Experience From Shingrix? In clinical trials, Shingrix was not associated with serious adverse events. In fact, serious side effects from vaccines are extremely rare. For example, for every 1 million doses of a vaccine given, only one or two people may have a severe allergic reaction. Signs of an allergic reaction happen within minutes or hours after  vaccination and include hives, swelling of the face and throat, difficulty breathing, a fast heartbeat, dizziness, or weakness. If you experience these or any other life-threatening symptoms, see a doctor right away. Shingrix causes a strong response in your immune system, so it may produce short-term side effects more intense than you are used to from other vaccines. These side effects can be uncomfortable, but they are expected and usually go away on their own in 2 or 3 days. Top of Page  How Can I Pay For Shingrix? There are several ways shingles vaccine may be paid for: Medicare . Medicare Part D plans cover the shingles vaccine, but there may be a cost to you depending on your plan. There may be a copay for the vaccine, or you may need to pay in full then get reimbursed for a certain amount.  . Medicare Part B does not cover the shingles vaccine. Medicaid . Medicaid may or may not cover the vaccine. Contact your insurer to find out. Private health insurance . Many private health insurance plans will cover the vaccine, but there may be a cost to you depending on your plan. Contact your insurer to find out. Vaccine assistance programs . Some pharmaceutical companies provide vaccines to eligible adults who cannot afford them. You may want to check with the vaccine manufacturer, [employer], about Shingrix. If you do not currently have health insurance, learn more about affordable health coverage optionsExternal. To find doctor's offices or pharmacies near you that offer the vaccine, visit [facility] Vaccine FinderExternal.

## 2020-05-09 NOTE — Progress Notes (Signed)
COMPLETE PHYSICAL     Assessment and Plan:  Glenn Stephens was seen today for annual exam.  Diagnoses and all orders for this visit:  Encounter for general adult medical examination with abnormal findings Yearly  Type 2 diabetes mellitus with stage 1 chronic kidney disease, without long-term current use of insulin (HCC) Continue medications:Synjardy XR 25-1000 daily and metformin XR 55m BID.  Discussed changing timing of medications Synjardy in am with breakfast, Metformin 5041mwith lunch and dinner. Discussed general issues about diabetes pathophysiology and management. Education: Reviewed 'ABCs' of diabetes management (respective goals in parentheses):  A1C (<7), blood pressure (<130/80), and cholesterol (LDL <70) Dietary recommendations Encouraged aerobic exercise.  Discussed foot care, check daily Yearly retinal exam Dental exam every 6 months Monitor blood glucose, discussed goal for patient -     Hemoglobin A1c  Essential hypertension Continue current medications: Losartan 5019monitor blood pressure at home; call if consistently over 130/80 Continue DASH diet.   Reminder to go to the ER if any CP, SOB, nausea, dizziness, severe HA, changes vision/speech, left arm numbness and tingling and jaw pain. -     CBC with Differential/Platelet -     COMPLETE METABOLIC PANEL WITH GFR -     TSH -     Magnesium  Hyperlipidemia, unspecified hyperlipidemia type Continue medications: Zetia rosuvastatin 5mg68mily, consider every other day related to myalgias? Discussed dietary and exercise modifications Low fat diet -     Lipid panel  Vitamin D deficiency Continue supplementation to maintain goal of 70-100 Taking Vitamin D5,000 IU daily -     VITAMIN D 25 Hydroxy (Vit-D Deficiency, Fractures)  OSA on CPAP Continue CPAP/BiPAP, using nightly for at least 8 hours  Helping with daytime fatigue Weight loss still advised Discussed mask & tubing hygeine  Diabetic peripheral neuropathy  associated with type 2 diabetes mellitus (HCC)Badening well at this time Checks feet daily Gabapentin 300mg90mrbid obesity (HCC) [place] dietary and exercise modifications  Asymptomatic varicose veins of both lower extremities Discussed compression stockings  Primary insomnia Doing well Gabapentin 300mg 6mty  Medication management Continued  Screening for blood or protein in urine -     Urinalysis w microscopic + reflex cultur  Screening for cardiovascular condition -     EKG 12-Lead  Generalized anxiety disorder Citalopram 40mg, 61mclonazepam 1mg hel60mg with RLS as well  Encounter for vitamin deficiency screening -     Vitamin B12  Screening, anemia, deficiency, iron -     Iron,Total/Total Iron Binding Cap  Discussed med's effects and SE's. Screening labs and tests as requested with regular follow-up as recommended. Over 40 minutes of face to face interview,  exam, counseling, chart review and critical decision making was performed  HPI Patient presents for a complete physical.  64 y.o. 35le following up on HTN, HLD, DMII, GERD, weight and vitamin D defciency.  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  He is on celexa for anxiety, only on 1/2 a pill.  He is on cholesterol medication, he is on zetia and crestor, and endorses myalgias.  He stopped rosuvastatin once before but cholesterol increased so he restarted this.  Consider every other day dosing pending results? Nextlezet? His cholesterol is not at goal less than  70. The cholesterol last visit was:   Lab Results  Component Value Date   CHOL 209 (H) 01/25/2020   HDL 39 (L) 01/25/2020   LDLCALC 126 (H) 01/25/2020   TRIG 309 (H) 01/25/2020   CHOLHDL 5.4 (H) 01/25/2020     He is checking his blood glucose 3-5 times a day and as needed. He has been working on diet and exercise for diabetes  with CKD is on ACE/ARB With hyperlipidemia he is on crestor 72m and zetia 132m he is on synjardy XR 25/1000 once  he is on bASA Checking sugars 113-140 Eye Exam: has one in a few weeks, gets yearly and denies paresthesia of the feet, polydipsia, polyuria and visual disturbances.  Last A1C in the office was:  Lab Results  Component Value Date   HGBA1C 7.3 (H) 12/23/2019   Lab Results  Component Value Date   GFRNONAA 74 01/25/2020   Lab Results  Component Value Date   CHOL 209 (H) 01/25/2020   HDL 39 (L) 01/25/2020   LDLCALC 126 (H) 01/25/2020   TRIG 309 (H) 01/25/2020   CHOLHDL 5.4 (H) 01/25/2020    Patient is on Vitamin D supplement, not on vitamin D.   Lab Results  Component Value Date   VD25OH 40 12/23/2019     Last PSA was: Lab Results  Component Value Date   PSA 0.1 05/06/2019   BMI is Body mass index is 32.32 kg/m., he is working on diet and exercise. Last sleep study negative, not on CPAP.  Wt Readings from Last 3 Encounters:  05/09/20 241 lb 9.6 oz (109.6 kg)  01/25/20 242 lb (109.8 kg)  12/23/19 236 lb (107 kg)    Current Medications:  Current Outpatient Medications on File Prior to Visit  Medication Sig Dispense Refill  . aspirin 81 MG tablet Take 81 mg by mouth daily.    . blood glucose meter kit and supplies Test sugars once dailyDispense based insurance preference. E11.9 1 each 0  . Cholecalciferol 125 MCG (5000 UT) capsule Take 5,000 Units by mouth daily.    . citalopram (CELEXA) 40 MG tablet TAKE 1 TABLET (40 MG TOTAL) BY MOUTH DAILY. 90 tablet 2  . clonazePAM (KLONOPIN) 1 MG tablet TAKE 1 TABLET BY MOUTH NIGHTLY FOR RESTLESS LEGS 90 tablet 0  . ezetimibe (ZETIA) 10 MG tablet Take 1 tablet Daily for Cholesterol 90 tablet 1  . gabapentin (NEURONTIN) 300 MG capsule TAKE 3 CAPSULES BY MOUTH AT NIGHT FOR CHRONIC PAIN OR SLEEP 270  capsule 1  . glucose blood (FREESTYLE LITE) test strip USE TO TEST BLOOD GLUCOSE TWICE A DAY FOR HYERPGLYCEMIA 100 strip 0  . ibuprofen (ADVIL,MOTRIN) 200 MG tablet Take 600 mg by mouth every 6 (six) hours as needed for headache, mild pain, moderate pain or cramping.    . Lancets (FREESTYLE) lancets USE TO TEST BLOOD GLUCOSE ONCE A DAY 100 each 0  . Magnesium 100 MG CAPS Take 100 mg by mouth daily.     . metFORMIN (GLUCOPHAGE-XR) 500 MG 24 hr tablet TAKE 1 TABLET BY MOUTH 2 TIMES A DAY. 180 tablet 3  . omeprazole (PRILOSEC) 20 MG capsule TAKE 1 CAPSULE (20 MG TOTAL) BY MOUTH DAILY. 90 capsule 2  . SYNJARDY XR 25-1000 MG TB24 TAKE 1 TABLET BY MOUTH ONCE DAILY. 90 tablet 1  . TURMERIC PO Take 1 capsule by mouth 2 (two) times daily.     . Marland Kitchenosartan (COZAAR) 50 MG tablet TAKE 1 TABLET (50 MG  TOTAL) BY MOUTH DAILY. 30 tablet 11   No current facility-administered medications on file prior to visit.   Health Maintenance:  Immunization History  Administered Date(s) Administered  . Influenza-Unspecified 05/07/2013  . Pneumococcal Polysaccharide-23 06/24/2013  . Td 07/09/2006   Tetanus: 2016 at work Pneumovax: 2014 Prevnar 13: Flu vaccine: at work 2021 Zostavax/Shingrix: Discussed with patient DEXA: Colonoscopy: 05/2018 ERCP 08/2018 EGD: Sleep study 2017 Eye Exam: [facility] 2021 Dentist: Dentures, fit well.  Patient Care Team: Unk Pinto, MD as PCP - General (Internal Medicine) Carol Ada, MD as Consulting Physician (Gastroenterology)  Dr. Johnsie Cancel 2007 normal stress test Dr. Kellie Simmering Dr. Benson [place] Dr. Lorin Mercy  Dr. Kristian Covey  Medical History:  Past Medical History:  Diagnosis Date  . Acute calculous cholecystitis s/p lap cholecystectopmy 05/23/2018 05/22/2018  . Cancer ([place])    skin cancer - basal cell  . Diabetes mellitus without complication ([place])   . Full dentures   . GERD (gastroesophageal reflux disease)   . Heart murmur    no problems per pt  . Hernia, abdominal   .  Hyperlipidemia   . Hypertension   . OSA (obstructive sleep apnea) 08/03/2013   pt does not use CPAP  . Pneumonia    as a small child only  . Superficial thrombophlebitis of left upper extremity 06/18/2018  . Unspecified vitamin D deficiency   . Wears glasses    Allergies Allergies  Allergen Reactions  . Caduet [Amlodipine-Atorvastatin] Hives and Itching  . Statins     BLURRED VISION    SURGICAL HISTORY He  has a past surgical history that includes orthopedic surgeries; Laser ablation; Vasectomy; Metatarsal osteotomy with bunionectomy; Cholecystectomy (N/A, 05/23/2018); Multiple tooth extractions; Upper esophageal endoscopic ultrasound (eus) (N/A, 07/23/2018); biopsy (07/23/2018); Esophagogastroduodenoscopy (egd) with propofol (N/A, 07/23/2018); Endoscopic retrograde cholangiopancreatography (ercp) with propofol (N/A, 08/13/2018); biopsy (08/13/2018); sphincterotomy (08/13/2018); and removal of stones (08/13/2018). FAMILY HISTORY His family history includes Cancer in his mother; Diabetes in his sister; Heart disease in his father; Hyperlipidemia in his father; Hypertension in his brother; Stomach cancer in his maternal grandfather. SOCIAL HISTORY He  reports that he quit smoking about 26 years ago. He has quit using smokeless tobacco.  His smokeless tobacco use included chew. He reports that he does not drink alcohol and does not use drugs.  Review of Systems:  Review of Systems  Constitutional: Negative.   HENT: Negative for congestion, ear discharge, ear pain, hearing loss, nosebleeds, sore throat and tinnitus.   Eyes: Negative.   Respiratory: Negative.  Negative for cough, shortness of breath and stridor.   Cardiovascular: Negative for chest pain, palpitations, orthopnea, claudication, leg swelling and PND.  Gastrointestinal: Negative.   Genitourinary: Negative.  Negative for frequency and urgency.  Musculoskeletal: Negative for back pain, falls, joint pain, myalgias and neck pain.  Skin:  Negative.   Neurological: Negative.  Negative for dizziness and headaches.  Psychiatric/Behavioral: Negative for depression, hallucinations, memory loss, substance abuse and suicidal ideas. The patient has insomnia. The patient is not nervous/anxious.     Physical Exam: Estimated body mass index is 32.32 kg/m as calculated from the following:   Height as of this encounter: 6' 0.5" (1.842 m).   Weight as of this encounter: 241 lb 9.6 oz (109.6 kg). BP 140/82   Pulse 72   Temp 98.1 F (36.7 C)   Ht 6' 0.5" (1.842 m)   Wt 241 lb 9.6 oz (109.6 kg)   SpO2 98%   BMI 32.32 kg/m  General Appearance: Well  nourished, in no apparent distress.  Eyes: PERRLA, EOMs, conjunctiva no swelling or erythema, normal fundi and vessels.  Sinuses: No Frontal/maxillary tenderness  ENT/Mouth: Ext aud canals clear, normal light reflex with TMs without erythema, bulging. Good dentition. No erythema, swelling, or exudate on post pharynx. Tonsils not swollen or erythematous. Hearing normal.  Neck: Supple, thyroid normal. No bruits  Respiratory: Respiratory effort normal, BS equal bilaterally without rales, rhonchi, wheezing or stridor.  Cardio: RRR without murmurs, rubs or gallops. Brisk peripheral pulses without edema.  Chest: symmetric, with normal excursions and percussion.  Abdomen: Soft, nontender, no guarding, rebound, hernias, masses, or organomegaly.  Lymphatics: Non tender without lymphadenopathy.  Genitourinary: defer Musculoskeletal: Full ROM all peripheral extremities,5/5 strength, and normal gait.Bilateral varicose veins.   Skin: Warm, dry without rashes, lesions, ecchymosis. Neuro: Cranial nerves intact, reflexes equal bilaterally. Normal muscle tone, no cerebellar symptoms. Sensation decreased bilateral feet to ankle. Psych: Awake and oriented X 3, normal affect, Insight and Judgment appropriate.     EKG: IBBB, no ST changes  AORTA SCAN: defer  Marcy Bogosian 5:24 PM [facility]

## 2020-05-10 LAB — HEMOGLOBIN A1C
Hgb A1c MFr Bld: 7.3 % of total Hgb — ABNORMAL HIGH (ref <5.7)
Mean Plasma Glucose: 163 (calc)
eAG (mmol/L): 9 (calc)

## 2020-05-10 LAB — CBC WITH DIFFERENTIAL/PLATELET
Absolute Monocytes: 422 cells/uL (ref 200–950)
Basophils Absolute: 41 cells/uL (ref 0–200)
Basophils Relative: 0.6 %
Eosinophils Absolute: 238 cells/uL (ref 15–500)
Eosinophils Relative: 3.5 %
HCT: 40.4 % (ref 38.5–50.0)
Hemoglobin: 14.2 g/dL (ref 13.2–17.1)
Lymphs Abs: 3121 cells/uL (ref 850–3900)
MCH: 30.5 pg (ref 27.0–33.0)
MCHC: 35.1 g/dL (ref 32.0–36.0)
MCV: 86.7 fL (ref 80.0–100.0)
MPV: 9.7 fL (ref 7.5–12.5)
Monocytes Relative: 6.2 %
Neutro Abs: 2978 cells/uL (ref 1500–7800)
Neutrophils Relative %: 43.8 %
Platelets: 203 10*3/uL (ref 140–400)
RBC: 4.66 10*6/uL (ref 4.20–5.80)
RDW: 12.9 % (ref 11.0–15.0)
Total Lymphocyte: 45.9 %
WBC: 6.8 10*3/uL (ref 3.8–10.8)

## 2020-05-10 LAB — COMPLETE METABOLIC PANEL WITH GFR
AG Ratio: 1.8 (calc) (ref 1.0–2.5)
ALT: 28 U/L (ref 9–46)
AST: 20 U/L (ref 10–35)
Albumin: 4.4 g/dL (ref 3.6–5.1)
Alkaline phosphatase (APISO): 43 U/L (ref 35–144)
BUN: 12 mg/dL (ref 7–25)
CO2: 28 mmol/L (ref 20–32)
Calcium: 9.5 mg/dL (ref 8.6–10.3)
Chloride: 103 mmol/L (ref 98–110)
Creat: 1.09 mg/dL (ref 0.70–1.25)
GFR, Est African American: 82 mL/min/{1.73_m2} (ref >=60)
GFR, Est Non African American: 71 mL/min/{1.73_m2} (ref >=60)
Globulin: 2.4 g/dL (calc) (ref 1.9–3.7)
Glucose, Bld: 122 mg/dL — ABNORMAL HIGH (ref 65–99)
Potassium: 4.1 mmol/L (ref 3.5–5.3)
Sodium: 140 mmol/L (ref 135–146)
Total Bilirubin: 0.7 mg/dL (ref 0.2–1.2)
Total Protein: 6.8 g/dL (ref 6.1–8.1)

## 2020-05-10 LAB — URINALYSIS W MICROSCOPIC + REFLEX CULTURE
Bacteria, UA: NONE SEEN /HPF
Bilirubin Urine: NEGATIVE
Hgb urine dipstick: NEGATIVE
Hyaline Cast: NONE SEEN /LPF
Ketones, ur: NEGATIVE
Leukocyte Esterase: NEGATIVE
Nitrites, Initial: NEGATIVE
Protein, ur: NEGATIVE
RBC / HPF: NONE SEEN /HPF (ref 0–2)
Specific Gravity, Urine: 1.039 — ABNORMAL HIGH (ref 1.001–1.03)
Squamous Epithelial / HPF: NONE SEEN /HPF (ref <=5)
WBC, UA: NONE SEEN /HPF (ref 0–5)
pH: <=5 (ref 5.0–8.0)

## 2020-05-10 LAB — VITAMIN B12: Vitamin B-12: 447 pg/mL (ref 200–1100)

## 2020-05-10 LAB — IRON, TOTAL/TOTAL IRON BINDING CAP
%SAT: 22 % (calc) (ref 20–48)
Iron: 92 ug/dL (ref 50–180)
TIBC: 424 mcg/dL (calc) (ref 250–425)

## 2020-05-10 LAB — VITAMIN D 25 HYDROXY (VIT D DEFICIENCY, FRACTURES): Vit D, 25-Hydroxy: 40 ng/mL (ref 30–100)

## 2020-05-10 LAB — LIPID PANEL
Cholesterol: 203 mg/dL — ABNORMAL HIGH (ref <200)
HDL: 34 mg/dL — ABNORMAL LOW (ref >=40)
Non-HDL Cholesterol (Calc): 169 mg/dL (calc) — ABNORMAL HIGH (ref <130)
Total CHOL/HDL Ratio: 6 (calc) — ABNORMAL HIGH (ref <5.0)
Triglycerides: 588 mg/dL — ABNORMAL HIGH (ref <150)

## 2020-05-10 LAB — NO CULTURE INDICATED

## 2020-05-10 LAB — MAGNESIUM: Magnesium: 1.6 mg/dL (ref 1.5–2.5)

## 2020-05-10 LAB — TSH: TSH: 3.05 mIU/L (ref 0.40–4.50)

## 2020-05-10 MED FILL — LOSARTAN POTASSIUM 50 MG TA: 50 | 30 days supply | Qty: 30 | Fill #0

## 2020-05-24 ENCOUNTER — Other Ambulatory Visit: Payer: Self-pay | Admitting: Adult Health Nurse Practitioner

## 2020-05-24 MED ORDER — FREESTYLE LIBRE 14 DAY SENSOR MISC: 1.0000 [IU] | 2 refills | Status: DC

## 2020-06-06 ENCOUNTER — Other Ambulatory Visit: Payer: Self-pay | Admitting: Adult Health Nurse Practitioner

## 2020-06-06 ENCOUNTER — Other Ambulatory Visit: Payer: Self-pay | Admitting: Adult Health

## 2020-06-06 DIAGNOSIS — R251 Tremor, unspecified: Secondary | ICD-10-CM

## 2020-06-06 MED FILL — clonazePAM 1 MG TABS: 1 | 90 days supply | Qty: 90 | Fill #0

## 2020-06-06 MED FILL — CITALOPRAM HBR 40 MG TABLET: 40 | 90 days supply | Qty: 90 | Fill #2

## 2020-06-06 MED FILL — OMEPRAZOLE DR 20 MG CAPSULE: 20 | 90 days supply | Qty: 90 | Fill #2

## 2020-06-06 MED FILL — LOSARTAN POTASSIUM 50 MG TA: 50 | 30 days supply | Qty: 30 | Fill #1

## 2020-06-07 DIAGNOSIS — J329 Chronic sinusitis, unspecified: Secondary | ICD-10-CM

## 2020-06-08 ENCOUNTER — Other Ambulatory Visit: Payer: Self-pay | Admitting: Adult Health Nurse Practitioner

## 2020-06-08 MED ORDER — AZITHROMYCIN 250 MG PO TABS: ORAL_TABLET | ORAL | 1 refills | Status: DC

## 2020-06-08 MED FILL — AZITHROMYCIN 250 MG TABLET: 250 | 5 days supply | Qty: 6 | Fill #0

## 2020-06-08 NOTE — Addendum Note (Signed)
Addended byGarnet Sierras A on: 06/08/2020 01:52 PM   Modules accepted: Orders

## 2020-06-08 NOTE — Addendum Note (Signed)
Addended by: Garnet Sierras A on: 06/08/2020 04:33 PM   Modules accepted: Orders

## 2020-07-12 IMAGING — US US ABDOMEN LIMITED
1 series · 14 of 25 positions shown · non-contrast
Comparison: None.

CLINICAL DATA: 62-year-old male with jaundice.  Recent colonoscopy.

EXAM:
ULTRASOUND ABDOMEN LIMITED RIGHT UPPER QUADRANT

[Series 1: us abdomen limited · 0.22mm/px · 14 of 45 slices shown]
[im 1/45]
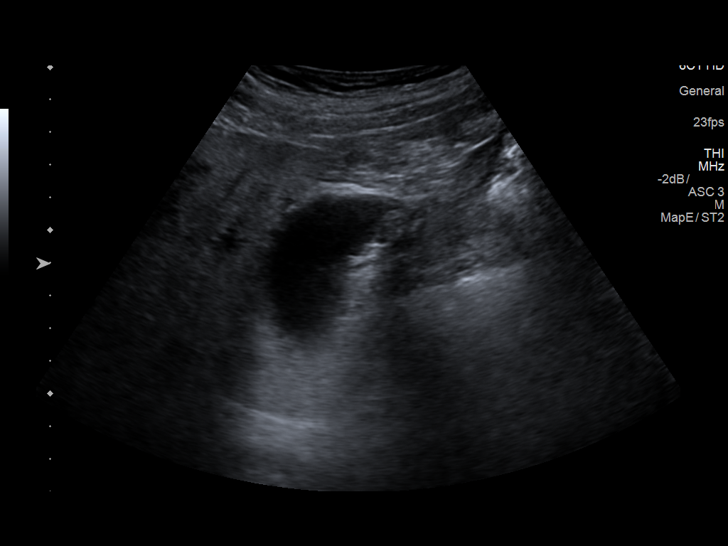
[im 4/45]
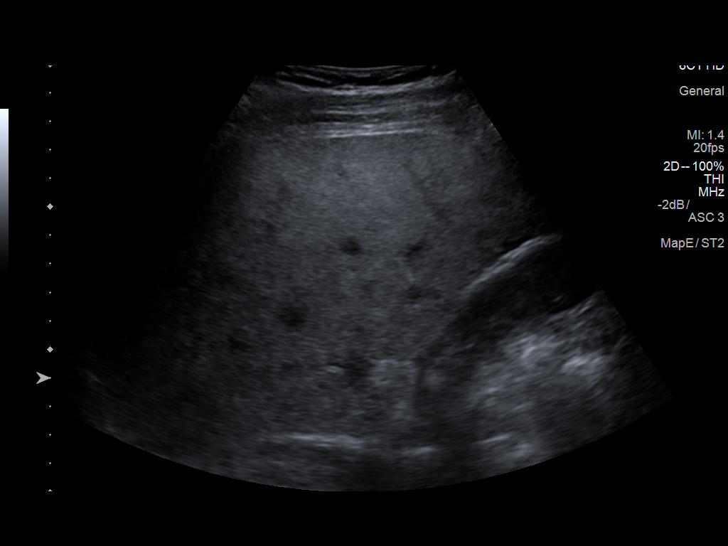
[im 8/45]
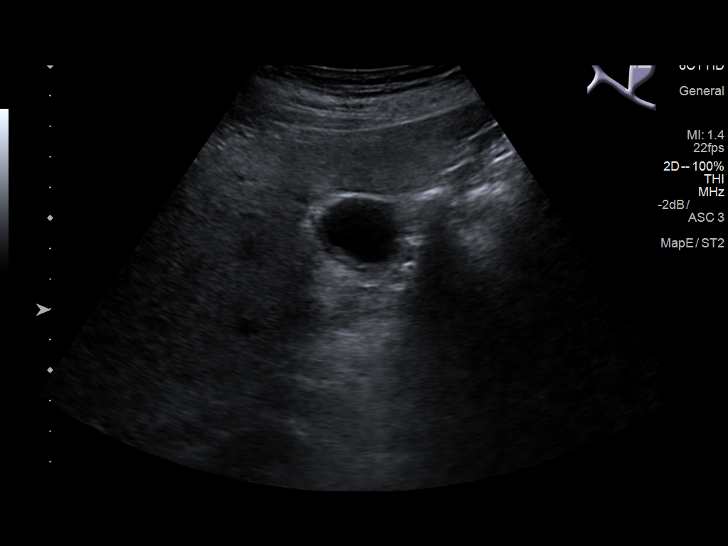
[im 12/45]
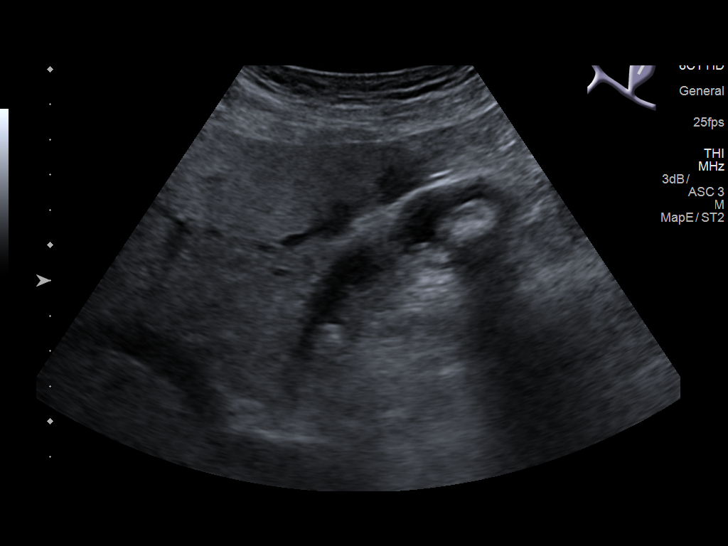
[im 15/45]
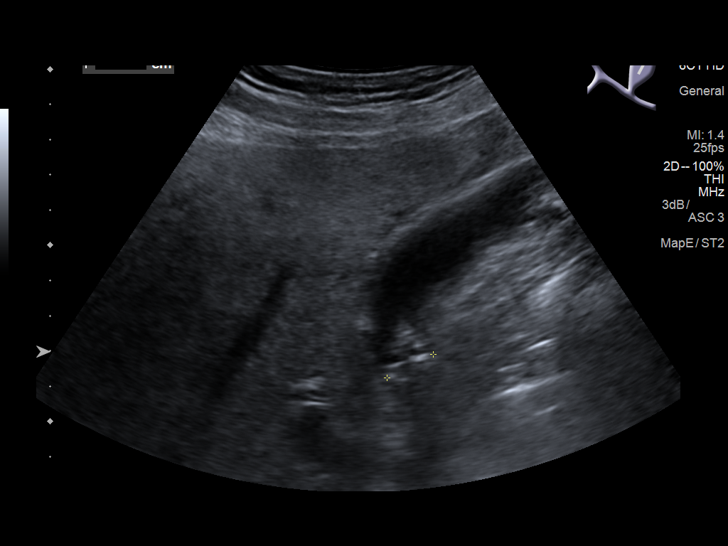
[im 17/45]
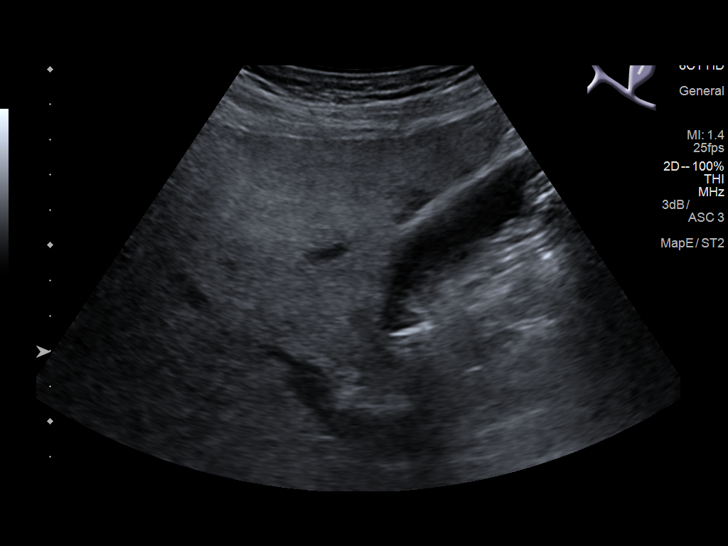
[im 21/45]
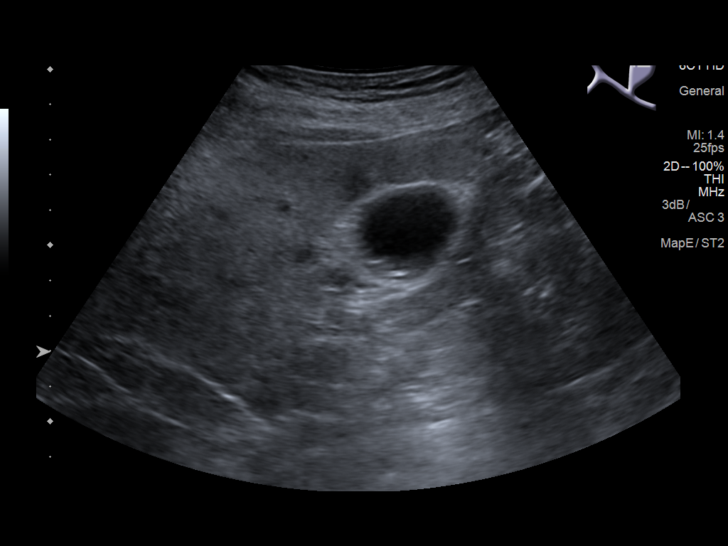
[im 24/45]
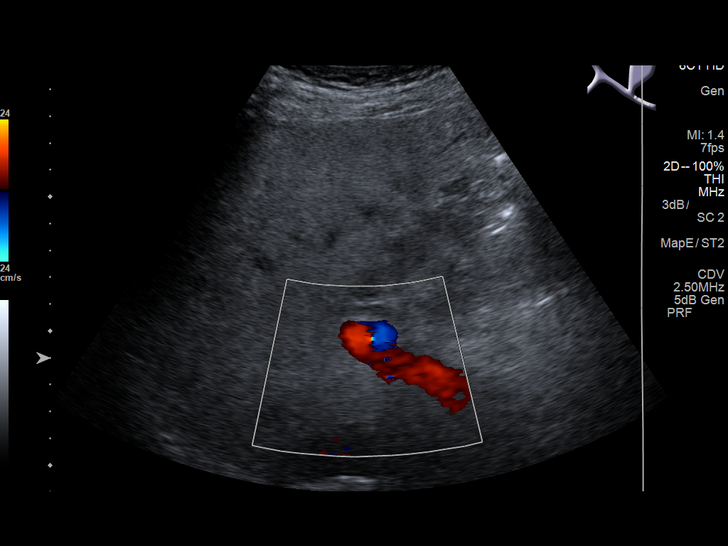
[im 28/45]
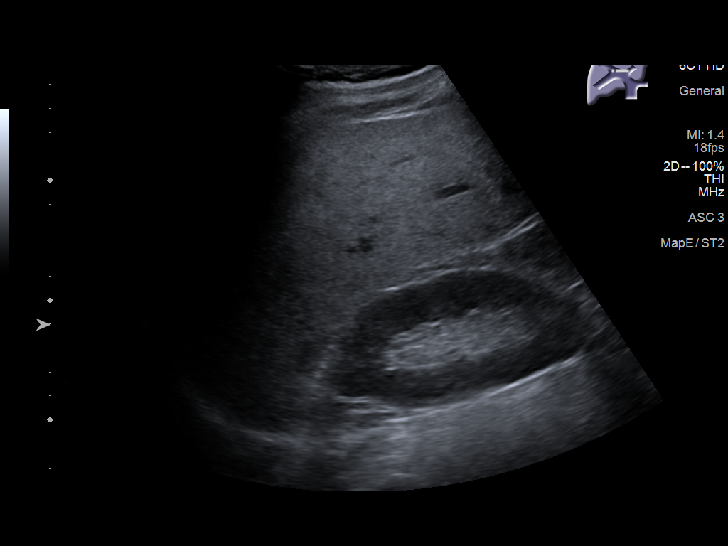
[im 30/45]
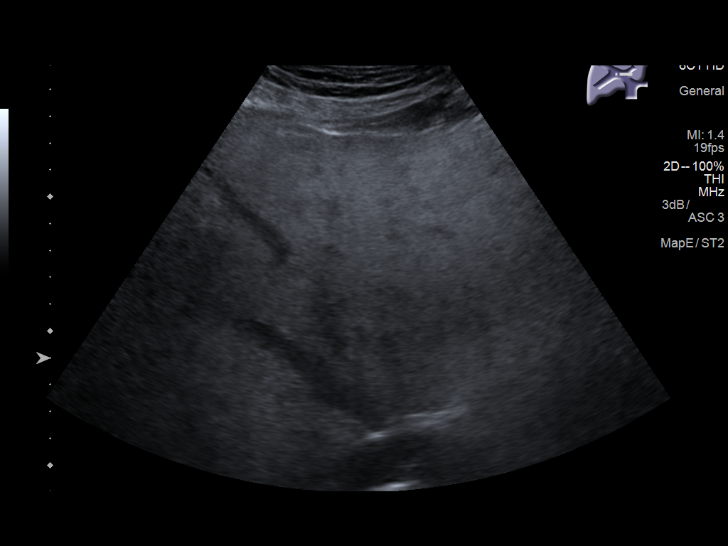
[im 34/45]
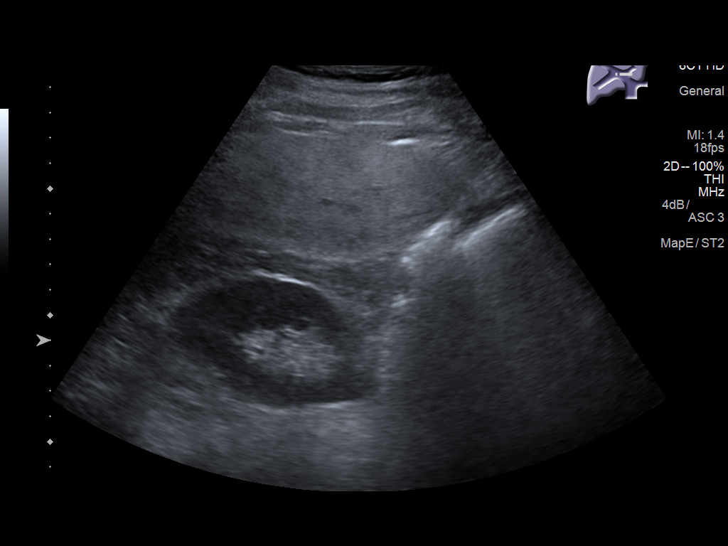
[im 37/45]
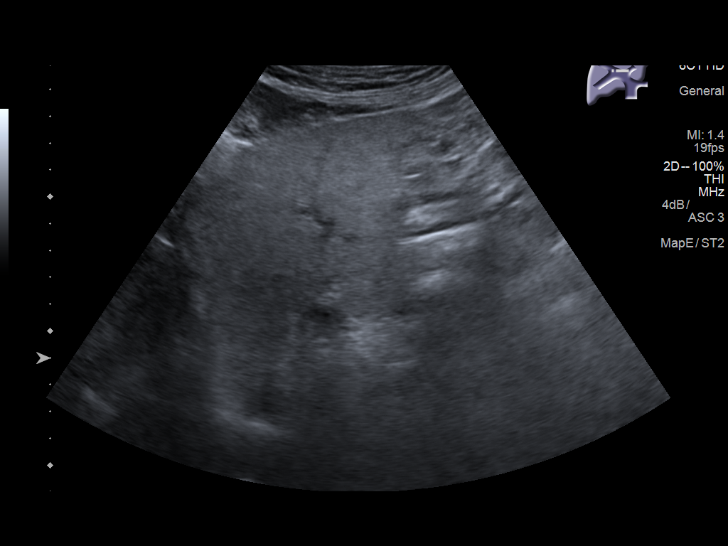
[im 41/45]
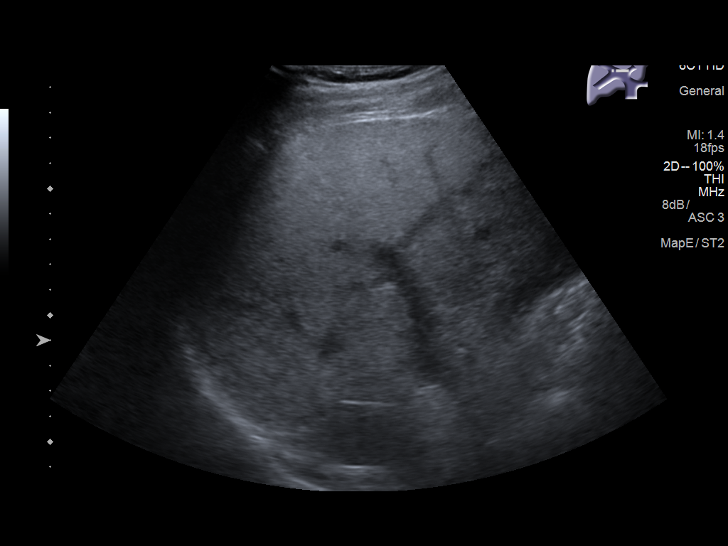
[im 45/45]
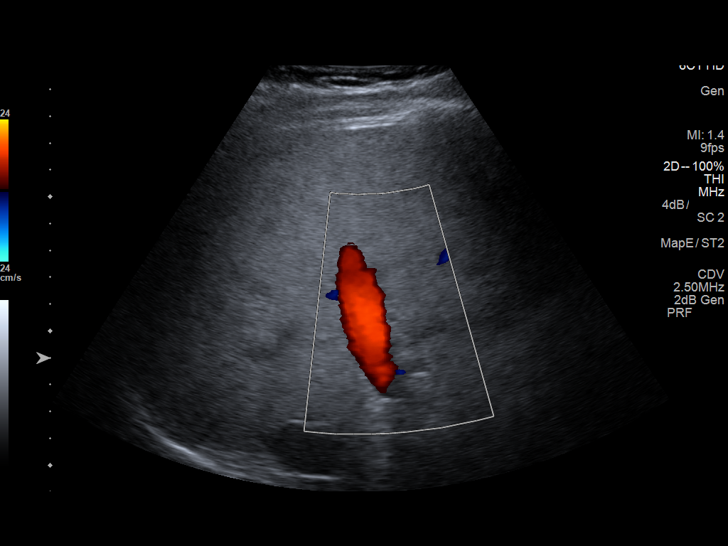

[14 of 25 positions shown; findings below may reference images not displayed]

FINDINGS: Gallbladder:

Shadowing gallstones individually up to 19 millimeters diameter.
There is a gallstone in the neck of the gallbladder on image 16.
There is borderline to mild gallbladder wall thickening of 2-3
millimeters. However, no sonographic Murphy sign was elicited.

Common bile duct:

Diameter: 4 millimeters, normal.

Liver:

Echogenic liver (image 29). No discrete liver lesion. No
intrahepatic biliary ductal dilatation. Portal vein is patent on
color Doppler imaging with normal direction of blood flow towards
the liver.

Other findings: Negative visible right kidney.
IMPRESSION: 1. Appearance suspicious for Acute Cholecystitis:
- cholelithiasis, including a stone in the neck of the gallbladder.
- mild gallbladder wall thickening,
- but no sonographic Murphy sign elicited.
2. No evidence of bile duct obstruction.
3. Fatty liver disease.

## 2020-07-12 MED FILL — LOSARTAN POTASSIUM 50 MG TA: 50 | 30 days supply | Qty: 30 | Fill #2

## 2020-07-26 MED FILL — SYNJARDY XR 25-1000 MG TB24: 25-1000 | 90 days supply | Qty: 90 | Fill #1

## 2020-07-26 MED FILL — METFORMIN HCL ER 500 MG TB2: 500 | 90 days supply | Qty: 180 | Fill #2

## 2020-08-17 ENCOUNTER — Other Ambulatory Visit: Payer: Self-pay | Admitting: Internal Medicine

## 2020-08-17 ENCOUNTER — Other Ambulatory Visit: Payer: Self-pay | Admitting: *Deleted

## 2020-08-17 DIAGNOSIS — E785 Hyperlipidemia, unspecified: Secondary | ICD-10-CM

## 2020-08-17 MED ORDER — EZETIMIBE 10 MG PO TABS: ORAL_TABLET | ORAL | 1 refills | Status: DC

## 2020-08-17 MED FILL — LOSARTAN POTASSIUM 50 MG TA: 50 | 30 days supply | Qty: 30 | Fill #3

## 2020-08-17 MED FILL — EZETIMIBE 10 MG TABS: 10 | 90 days supply | Qty: 90 | Fill #0

## 2020-08-22 ENCOUNTER — Ambulatory Visit: Payer: 59 | Admitting: Adult Health Nurse Practitioner

## 2020-08-30 ENCOUNTER — Ambulatory Visit: Payer: 59 | Admitting: Internal Medicine

## 2020-08-30 ENCOUNTER — Other Ambulatory Visit: Payer: Self-pay

## 2020-08-30 ENCOUNTER — Encounter: Payer: Self-pay | Admitting: Internal Medicine

## 2020-08-30 VITALS — BP 124/72 | HR 75 | Temp 96.8°F | Resp 16 | Ht 72.5 in | Wt 240.8 lb

## 2020-08-30 DIAGNOSIS — E559 Vitamin D deficiency, unspecified: Secondary | ICD-10-CM | POA: Diagnosis not present

## 2020-08-30 DIAGNOSIS — I10 Essential (primary) hypertension: Secondary | ICD-10-CM

## 2020-08-30 DIAGNOSIS — Z9989 Dependence on other enabling machines and devices: Secondary | ICD-10-CM

## 2020-08-30 DIAGNOSIS — E785 Hyperlipidemia, unspecified: Secondary | ICD-10-CM | POA: Diagnosis not present

## 2020-08-30 DIAGNOSIS — Z79899 Other long term (current) drug therapy: Secondary | ICD-10-CM

## 2020-08-30 DIAGNOSIS — E1169 Type 2 diabetes mellitus with other specified complication: Secondary | ICD-10-CM | POA: Diagnosis not present

## 2020-08-30 DIAGNOSIS — G4733 Obstructive sleep apnea (adult) (pediatric): Secondary | ICD-10-CM | POA: Diagnosis not present

## 2020-08-30 DIAGNOSIS — N182 Chronic kidney disease, stage 2 (mild): Secondary | ICD-10-CM

## 2020-08-30 DIAGNOSIS — E0822 Diabetes mellitus due to underlying condition with diabetic chronic kidney disease: Secondary | ICD-10-CM

## 2020-08-30 DIAGNOSIS — K219 Gastro-esophageal reflux disease without esophagitis: Secondary | ICD-10-CM

## 2020-08-30 NOTE — Progress Notes (Signed)
History of Present Illness:       This very nice 65 y.o.  MWM presents for 3 month follow up with HTN, HLD, T2_NIDDM, GERD and Vitamin D Deficiency.  Patient has OSA on CPAP       Patient is treated for HTN circa 2005 & BP has been controlled on meds.  Today's BP is at goal -  124/72. Patient has had no complaints of any cardiac type chest pain, palpitations, dyspnea / orthopnea / PND, dizziness, claudication, or dependent edema.      Hyperlipidemia is not controlled with diet & meds. Patient reports severe myalgias even on low dose Crestor. Last Lipids were not at goal:  Lab Results  Component Value Date   CHOL 203 (H) 05/09/2020   HDL 34 (L) 05/09/2020   LDLCALC not calculated 05/09/2020   TRIG 588 (H) 05/09/2020   CHOLHDL 6.0 (H) 05/09/2020    Also, the patient has history of T2_NIDDM  (2009) w/CKD2 (GFR 71)  and has had no symptoms of reactive hypoglycemia, diabetic polys, paresthesias or visual blurring.  Last A1c was not at goal:  Lab Results  Component Value Date   HGBA1C 7.3 (H) 05/09/2020       Further, the patient also has history of Vitamin D Deficiency and supplements vitamin D without any suspected side-effects. Last vitamin D was still low:  Lab Results  Component Value Date   VD25OH 40 05/09/2020    Current Outpatient Medications on File Prior to Visit  Medication Sig  . aspirin 81 MG tablet Take daily.  . Cholecalciferol  5000 u capsule Take 5,000 Units  daily.  . citalopram 40 MG tablet TAKE 1 TABLET DAILY.  . clonazePAM  1 MG tablet TAKE 1 TABLET  NIGHTLY FOR RLS  . ezetimibe (ZETIA) 10 MG tablet Take 1 tablet Daily for Cholesterol  . gabapentin  300 MG capsule TAKE 3 CAPSULES  AT NIGHT FOR CHRONIC PAIN OR SLEEP  . ibuprofen  200 MG tablet Take 600 mg  every 6  hours as needed for pain  . losartan  50 MG tablet Take 1 tablet  daily.  . metFORMIN -XR) 500 MG  TAKE 1 TABLET  2 TIMES A DAY.  Marland Kitchen omeprazole ( 20 MG capsule TAKE 1 CAPSULE (20 MG TOTAL)  BY MOUTH DAILY.  . SYNJARDY XR 25-1000 MG TB24 TAKE 1 TABLET  ONCE DAILY.  Marland Kitchen TURMERIC  Take 1 capsule  2  times daily.   . Magnesium 100 MG CAPS Patient not taking: Reported on 08/30/2020)  . rosuvastatin (CRESTOR) 5 MG tablet Take 1 tablet (5 mg total)at bedtime. (Patient not taking: Reported on 08/30/2020)    Allergies  Allergen Reactions  . Caduet [Amlodipine-Atorvastatin] Hives and Itching  . Statins     BLURRED VISION    PMHx:   Past Medical History:  Diagnosis Date  . Acute calculous cholecystitis s/p lap cholecystectopmy 05/23/2018 05/22/2018  . Cancer ([place])    skin cancer - basal cell  . Diabetes mellitus without complication ([place])   . Full dentures   . GERD (gastroesophageal reflux disease)   . Heart murmur    no problems per pt  . Hernia, abdominal   . Hyperlipidemia   . Hypertension   . OSA (obstructive sleep apnea) 08/03/2013   pt does not use CPAP  . Pneumonia    as a small child only  . Superficial thrombophlebitis of left upper extremity 06/18/2018  . Unspecified vitamin  D deficiency   . Wears glasses     Immunization History  Administered Date(s) Administered  . Influenza-Unspecified 05/07/2013  . Pneumococcal Polysaccharide-23 06/24/2013  . Td 07/09/2006    Past Surgical History:  Procedure Laterality Date  . BIOPSY  07/23/2018   Procedure: BIOPSY;  Surgeon: Rush Landmark Telford Nab., MD;  Location: [facility];  Service: Gastroenterology;;  . BIOPSY  08/13/2018   Procedure: BIOPSY;  Surgeon: Irving Copas., MD;  Location: [facility];  Service: Gastroenterology;;  . CHOLECYSTECTOMY N/A 05/23/2018   Procedure: LAPAROSCOPIC CHOLECYSTECTOMY;  Surgeon: Clovis Riley, MD;  Location: [facility];  Service: General;  Laterality: N/A;  . ENDOSCOPIC RETROGRADE CHOLANGIOPANCREATOGRAPHY (ERCP) WITH PROPOFOL N/A 08/13/2018   Procedure: ENDOSCOPIC RETROGRADE CHOLANGIOPANCREATOGRAPHY (ERCP) WITH PROPOFOL;  Surgeon: Irving Copas., MD;  Location: [facility];  Service: Gastroenterology;  Laterality: N/A;  . ESOPHAGOGASTRODUODENOSCOPY (EGD) WITH PROPOFOL N/A 07/23/2018   Procedure: ESOPHAGOGASTRODUODENOSCOPY (EGD) WITH PROPOFOL;  Surgeon: Rush Landmark Telford Nab., MD;  Location: [facility];  Service: Gastroenterology;  Laterality: N/A;  . LASER ABLATION     veins  . METATARSAL OSTEOTOMY WITH BUNIONECTOMY    . MULTIPLE TOOTH EXTRACTIONS    . orthopedic surgeries     knee,foot  . REMOVAL OF STONES  08/13/2018   Procedure: REMOVAL OF STONES;  Surgeon: Rush Landmark Telford Nab., MD;  Location: [facility];  Service: Gastroenterology;;  . Joan Mayans  08/13/2018   Procedure: Joan Mayans;  Surgeon: Irving Copas., MD;  Location: [facility];  Service: Gastroenterology;;  . UPPER ESOPHAGEAL ENDOSCOPIC ULTRASOUND (EUS) N/A 07/23/2018   Procedure: UPPER ESOPHAGEAL ENDOSCOPIC ULTRASOUND (EUS);  Surgeon: Irving Copas., MD;  Location: [facility];  Service: Gastroenterology;  Laterality: N/A;  . VASECTOMY      FHx:    Reviewed / unchanged  SHx:    Reviewed / unchanged   Systems Review:  Constitutional: Denies fever, chills, wt changes, headaches, insomnia, fatigue, night sweats, change in appetite. Eyes: Denies redness, blurred vision, diplopia, discharge, itchy, watery eyes.  ENT: Denies discharge, congestion, post nasal drip, epistaxis, sore throat, earache, hearing loss, dental pain, tinnitus, vertigo, sinus pain, snoring.  CV: Denies chest pain, palpitations, irregular heartbeat, syncope, dyspnea, diaphoresis, orthopnea, PND, claudication or edema. Respiratory: denies cough, dyspnea, DOE, pleurisy, hoarseness, laryngitis, wheezing.  Gastrointestinal: Denies dysphagia, odynophagia, heartburn, reflux, water brash, abdominal pain or cramps, nausea, vomiting, bloating, diarrhea, constipation, hematemesis, melena, hematochezia  or hemorrhoids. Genitourinary: Denies dysuria, frequency, urgency, nocturia, hesitancy,  discharge, hematuria or flank pain. Musculoskeletal: Denies arthralgias, myalgias, stiffness, jt. swelling, pain, limping or strain/sprain.  Skin: Denies pruritus, rash, hives, warts, acne, eczema or change in skin lesion(s). Neuro: No weakness, tremor, incoordination, spasms, paresthesia or pain. Psychiatric: Denies confusion, memory loss or sensory loss. Endo: Denies change in weight, skin or hair change.  Heme/Lymph: No excessive bleeding, bruising or enlarged lymph nodes.  Physical Exam  BP 124/72   Pulse 75   Temp (!) 96.8 F (36 C)   Resp 16   Ht 6' 0.5" (1.842 m)   Wt 240 lb 12.8 oz (109.2 kg)   SpO2 94%   BMI 32.21 kg/m   Appears  well nourished, well groomed  and in no distress.  Eyes: PERRLA, EOMs, conjunctiva no swelling or erythema. Sinuses: No frontal/maxillary tenderness ENT/Mouth: EAC's clear, TM's nl w/o erythema, bulging. Nares clear w/o erythema, swelling, exudates. Oropharynx clear without erythema or exudates. Oral hygiene is good. Tongue normal, non obstructing. Hearing intact.  Neck: Supple. Thyroid not palpable. Car 2+/2+ without bruits,  nodes or JVD. Chest: Respirations nl with BS clear & equal w/o rales, rhonchi, wheezing or stridor.  Cor: Heart sounds normal w/ regular rate and rhythm without sig. murmurs, gallops, clicks or rubs. Peripheral pulses normal and equal  without edema.  Abdomen: Soft & bowel sounds normal. Non-tender w/o guarding, rebound, hernias, masses or organomegaly.  Lymphatics: Unremarkable.  Musculoskeletal: Full ROM all peripheral extremities, joint stability, 5/5 strength and normal gait.  Skin: Warm, dry without exposed rashes, lesions or ecchymosis apparent.  Neuro: Cranial nerves intact, reflexes equal bilaterally. Sensory-motor testing grossly intact. Tendon reflexes grossly intact.  Pysch: Alert & oriented x 3.  Insight and judgement nl & appropriate. No ideations.  Assessment and Plan:  1. Essential hypertension  - Continue  medication, monitor blood pressure at home.  - Continue DASH diet.  Reminder to go to the ER if any CP,  SOB, nausea, dizziness, severe HA, changes vision/speech.  - CBC with Differential/Platelet - COMPLETE METABOLIC PANEL WITH GFR - Magnesium - TSH  2. Hyperlipidemia associated with type 2 diabetes mellitus ([place])  - Continue diet/meds, exercise,& lifestyle modifications.  - Continue monitor periodic cholesterol/liver & renal functions   - Lipid panel - TSH  3. Diabetes mellitus due to underlying condition with stage 2 chronic kidney disease, without long-term current use of insulin (HCC)  - Continue diet, exercise  - Lifestyle modifications.  - Monitor appropriate labs.  - Hemoglobin A1c - Insulin, random  4. Vitamin D deficiency  - Continue supplementation.  - VITAMIN D 25 Hydroxy   5. OSA on CPAP   6. Gastroesophageal reflux disease  - CBC with Differential/Platelet  7. Medication management  - CBC with Differential/Platelet - COMPLETE METABOLIC PANEL WITH GFR - Magnesium - Lipid panel - TSH - Hemoglobin A1c - Insulin, random - VITAMIN D 25 Hydroxy         Discussed  regular exercise, BP monitoring, weight control to achieve/maintain BMI less than 25 and discussed med and SE's. Recommended labs to assess and monitor clinical status with further disposition pending results of labs.  I discussed the assessment and treatment plan with the patient. The patient was provided an opportunity to ask questions and all were answered. The patient agreed with the plan and demonstrated an understanding of the instructions.  I provided over 30 minutes of exam, counseling, chart review and  complex critical decision making.       The patient was advised to call back or seek an in-person evaluation if the symptoms worsen or if the condition fails to improve as anticipated.   Kirtland Bouchard, MD

## 2020-08-30 NOTE — Patient Instructions (Signed)
Due to recent changes in healthcare laws, you may see the results of your imaging and laboratory studies on MyChart before your provider has had a chance to review them.  We understand that in some cases there may be results that are confusing or concerning to you. Not all laboratory results come back in the same time frame and the provider may be waiting for multiple results in order to interpret others.  Please give [place] 48 hours in order for your provider to thoroughly review all the results before contacting the office for clarification of your results.   Due to recent changes in healthcare laws, you may see the results of your imaging and laboratory studies on MyChart before your provider has had a chance to review them.  We understand that in some cases there may be results that are confusing or concerning to you. Not all laboratory results come back in the same time frame and the provider may be waiting for multiple results in order to interpret others.  Please give [place] 48 hours in order for your provider to thoroughly review all the results before contacting the office for clarification of your results.     ++++++++++++++++++++++++++++++++++  Vit D  & Vit C 1,000 mg   are recommended to help protect  against the Covid-19 and other Corona viruses.    Also it's recommended  to take  Zinc 50 mg  to help  protect against the Covid-19   and best place to get  is also on [employer].com  and don't pay more than 6-8 cents /pill !   ===================================== Coronavirus (COVID-19) Are you at risk?  Are you at risk for the Coronavirus (COVID-19)?  To be considered HIGH RISK for Coronavirus (COVID-19), you have to meet the following criteria:  . Traveled to [place], [place], [place], [place] or [place]; or in the [place] to [place], [place], [place]  . or [place]; and have fever, cough, and shortness of breath within the last 2 weeks of travel OR . Been in close contact  with a person diagnosed with COVID-19 within the last 2 weeks and have  . fever, cough,and shortness of breath .  . IF YOU DO NOT MEET THESE CRITERIA, YOU ARE CONSIDERED LOW RISK FOR COVID-19.  What to do if you are HIGH RISK for COVID-19?  Marland Kitchen If you are having a medical emergency, call 911. . Seek medical care right away. Before you go to a doctor's office, urgent care or emergency department, .  call ahead and tell them about your recent travel, contact with someone diagnosed with COVID-19  .  and your symptoms.  . You should receive instructions from your physician's office regarding next steps of care.  . When you arrive at healthcare provider, tell the healthcare staff immediately you have returned from  . visiting [place], [place], [place], [place] or [place]; or traveled in the [place] to [place], [place],  . [place] or [place] in the last two weeks or you have been in close contact with a person diagnosed with  . COVID-19 in the last 2 weeks.   . Tell the health care staff about your symptoms: fever, cough and shortness of breath. . After you have been seen by a medical provider, you will be either: o Tested for (COVID-19) and discharged home on quarantine except to seek medical care if  o symptoms worsen, and asked to  - Stay home and avoid contact with  others until you get your results (4-5 days)  - Avoid travel on public transportation if possible (such as bus, train, or airplane) or o Sent to the Emergency Department by EMS for evaluation, COVID-19 testing  and  o possible admission depending on your condition and test results.  What to do if you are LOW RISK for COVID-19?  Reduce your risk of any infection by using the same precautions used for avoiding the common cold or flu:  Marland Kitchen Wash your hands often with soap and warm water for at least 20 seconds.  If soap and water are not readily available,  . use an alcohol-based hand sanitizer with at least 60% alcohol.   . If coughing or sneezing, cover your mouth and nose by coughing or sneezing into the elbow areas of your shirt or coat, .  into a tissue or into your sleeve (not your hands). . Avoid shaking hands with others and consider head nods or verbal greetings only. . Avoid touching your eyes, nose, or mouth with unwashed hands.  . Avoid close contact with people who are sick. . Avoid places or events with large numbers of people in one location, like concerts or sporting events. . Carefully consider travel plans you have or are making. . If you are planning any travel outside or inside the [place], visit the CDC's Travelers' Health webpage for the latest health notices. . If you have some symptoms but not all symptoms, continue to monitor at home and seek medical attention  . if your symptoms worsen. . If you are having a medical emergency, call 911.   ++++++++++++++++++++++++++++++++ Recommend Adult Low Dose Aspirin or  coated  Aspirin 81 mg daily  To reduce risk of Colon Cancer 40 %,  Skin Cancer 26 % ,  Melanoma 46%  and  Pancreatic cancer 60% ++++++++++++++++++++++++++++++++ Vitamin D goal  is between 70-100.  Please make sure that you are taking your Vitamin D as directed.  It is very important as a natural anti-inflammatory  helping hair, skin, and nails, as well as reducing stroke and heart attack risk.  It helps your bones and helps with mood. It also decreases numerous cancer risks so please take it as directed.  Low Vit D is associated with a 200-300% higher risk for CANCER  and 200-300% higher risk for HEART   ATTACK  &  STROKE.   .....................................Marland Kitchen It is also associated with higher death rate at younger ages,  autoimmune diseases like Rheumatoid arthritis, Lupus, Multiple Sclerosis.    Also many other serious conditions, like depression, Alzheimer's Dementia, infertility, muscle aches, fatigue, fibromyalgia - just to name a  few. ++++++++++++++++++++ Recommend the book "The END of DIETING" by Dr Excell Seltzer  & the book "The END of DIABETES " by Dr Excell Seltzer At [email or web address].[email or web address] - get book & Audio CD's    Being diabetic has a  300% increased risk for heart attack, stroke, cancer, and alzheimer- type vascular dementia. It is very important that you work harder with diet by avoiding all foods that are white. Avoid white rice (brown & wild rice is OK), white potatoes (sweetpotatoes in moderation is OK), White bread or wheat bread or anything made out of white flour like bagels, donuts, rolls, buns, biscuits, cakes, pastries, cookies, pizza crust, and pasta (made from white flour & egg whites) - vegetarian pasta or spinach or wheat pasta is OK. Multigrain breads like Arnold's or [employer], or multigrain sandwich thins or flatbreads.  Diet, exercise  and weight loss can reverse and cure diabetes in the early stages.  Diet, exercise and weight loss is very important in the control and prevention of complications of diabetes which affects every system in your body, ie. Brain - dementia/stroke, eyes - glaucoma/blindness, heart - heart attack/heart failure, kidneys - dialysis, stomach - gastric paralysis, intestines - malabsorption, nerves - severe painful neuritis, circulation - gangrene & loss of a leg(s), and finally cancer and Alzheimers.    I recommend avoid fried & greasy foods,  sweets/candy, white rice (brown or wild rice or Quinoa is OK), white potatoes (sweet potatoes are OK) - anything made from white flour - bagels, doughnuts, rolls, buns, biscuits,white and wheat breads, pizza crust and traditional pasta made of white flour & egg white(vegetarian pasta or spinach or wheat pasta is OK).  Multi-grain bread is OK - like multi-grain flat bread or sandwich thins. Avoid alcohol in excess. Exercise is also important.    Eat all the vegetables you want - avoid meat, especially red meat and dairy - especially cheese.  Cheese is the  most concentrated form of trans-fats which is the worst thing to clog up our arteries. Veggie cheese is OK which can be found in the fresh produce section at [employer] or [employer] or [employer]  +++++++++++++++++++++ DASH Eating Plan  DASH stands for "Dietary Approaches to Stop Hypertension."   The DASH eating plan is a healthy eating plan that has been shown to reduce high blood pressure (hypertension). Additional health benefits may include reducing the risk of type 2 diabetes mellitus, heart disease, and stroke. The DASH eating plan may also help with weight loss. WHAT DO I NEED TO KNOW ABOUT THE DASH EATING PLAN? For the DASH eating plan, you will follow these general guidelines:  Choose foods with a percent daily value for sodium of less than 5% (as listed on the food label).  Use salt-free seasonings or herbs instead of table salt or sea salt.  Check with your health care provider or pharmacist before using salt substitutes.  Eat lower-sodium products, often labeled as "lower sodium" or "no salt added."  Eat fresh foods.  Eat more vegetables, fruits, and low-fat dairy products.  Choose whole grains. Look for the word "whole" as the first word in the ingredient list.  Choose fish   Limit sweets, desserts, sugars, and sugary drinks.  Choose heart-healthy fats.  Eat veggie cheese   Eat more home-cooked food and less restaurant, buffet, and fast food.  Limit fried foods.  Cook foods using methods other than frying.  Limit canned vegetables. If you do use them, rinse them well to decrease the sodium.  When eating at a restaurant, ask that your food be prepared with less salt, or no salt if possible.                      WHAT FOODS CAN I EAT? Read Dr Fara Olden Fuhrman's books on The End of Dieting & The End of Diabetes  Grains Whole grain or whole wheat bread. Brown rice. Whole grain or whole wheat pasta. Quinoa, bulgur, and whole grain cereals. Low-sodium cereals.  Corn or whole wheat flour tortillas. Whole grain cornbread. Whole grain crackers. Low-sodium crackers.  Vegetables Fresh or frozen vegetables (raw, steamed, roasted, or grilled). Low-sodium or reduced-sodium tomato and vegetable juices. Low-sodium or reduced-sodium tomato sauce and paste. Low-sodium or reduced-sodium canned vegetables.   Fruits All fresh, canned (in natural juice), or frozen fruits.  Protein Products  All fish and seafood.  Dried beans, peas, or lentils. Unsalted nuts and seeds. Unsalted canned beans.  Dairy Low-fat dairy products, such as skim or 1% milk, 2% or reduced-fat cheeses, low-fat ricotta or cottage cheese, or plain low-fat yogurt. Low-sodium or reduced-sodium cheeses.  Fats and Oils Tub margarines without trans fats. Light or reduced-fat mayonnaise and salad dressings (reduced sodium). Avocado. Safflower, olive, or canola oils. Natural peanut or almond butter.  Other Unsalted popcorn and pretzels. The items listed above may not be a complete list of recommended foods or beverages. Contact your dietitian for more options.  +++++++++++++++  WHAT FOODS ARE NOT RECOMMENDED? Grains/ White flour or wheat flour White bread. White pasta. White rice. Refined cornbread. Bagels and croissants. Crackers that contain trans fat.  Vegetables  Creamed or fried vegetables. Vegetables in a . Regular canned vegetables. Regular canned tomato sauce and paste. Regular tomato and vegetable juices.  Fruits Dried fruits. Canned fruit in light or heavy syrup. Fruit juice.  Meat and Other Protein Products Meat in general - RED meat & White meat.  Fatty cuts of meat. Ribs, chicken wings, all processed meats as bacon, sausage, bologna, salami, fatback, hot dogs, bratwurst and packaged luncheon meats.  Dairy Whole or 2% milk, cream, half-and-half, and cream cheese. Whole-fat or sweetened yogurt. Full-fat cheeses or blue cheese. Non-dairy creamers and whipped toppings. Processed  cheese, cheese spreads, or cheese curds.  Condiments Onion and garlic salt, seasoned salt, table salt, and sea salt. Canned and packaged gravies. Worcestershire sauce. Tartar sauce. Barbecue sauce. Teriyaki sauce. Soy sauce, including reduced sodium. Steak sauce. Fish sauce. Oyster sauce. Cocktail sauce. Horseradish. Ketchup and mustard. Meat flavorings and tenderizers. Bouillon cubes. Hot sauce. Tabasco sauce. Marinades. Taco seasonings. Relishes.  Fats and Oils Butter, stick margarine, lard, shortening and bacon fat. Coconut, palm kernel, or palm oils. Regular salad dressings.  Pickles and olives. Salted popcorn and pretzels.  The items listed above may not be a complete list of foods and beverages to avoid.   +++++++++++++++++++++++++++++++  Vit D  & Vit C 1,000 mg   are recommended to help protect  against the Covid-19 and other Corona viruses.    Also it's recommended  to take  Zinc 50 mg  to help  protect against the Covid-19   and best place to get  is also on [employer]com  and don't pay more than 6-8 cents /pill !  ================================ Coronavirus (COVID-19) Are you at risk?  Are you at risk for the Coronavirus (COVID-19)?  To be considered HIGH RISK for Coronavirus (COVID-19), you have to meet the following criteria:  . Traveled to [place], [place], [place], [place] or [place]; or in the [place] to [place], [place], [place]  . or [place]; and have fever, cough, and shortness of breath within the last 2 weeks of travel OR . Been in close contact with a person diagnosed with COVID-19 within the last 2 weeks and have  . fever, cough,and shortness of breath .  . IF YOU DO NOT MEET THESE CRITERIA, YOU ARE CONSIDERED LOW RISK FOR COVID-19.  What to do if you are HIGH RISK for COVID-19?  Marland Kitchen If you are having a medical emergency, call 911. . Seek medical care right away. Before you go to a doctor's office, urgent care or emergency department, .  call  ahead and tell them about your recent travel, contact with someone diagnosed with COVID-19  .  and your symptoms.  . You should receive  instructions from your physician's office regarding next steps of care.  . When you arrive at healthcare provider, tell the healthcare staff immediately you have returned from  . visiting [place], [place], [place], [place] or [place]; or traveled in the [place] to [place], [place],  . [place] or [place] in the last two weeks or you have been in close contact with a person diagnosed with  . COVID-19 in the last 2 weeks.   . Tell the health care staff about your symptoms: fever, cough and shortness of breath. . After you have been seen by a medical provider, you will be either: o Tested for (COVID-19) and discharged home on quarantine except to seek medical care if  o symptoms worsen, and asked to  - Stay home and avoid contact with others until you get your results (4-5 days)  - Avoid travel on public transportation if possible (such as bus, train, or airplane) or o Sent to the Emergency Department by EMS for evaluation, COVID-19 testing  and  o possible admission depending on your condition and test results.  What to do if you are LOW RISK for COVID-19?  Reduce your risk of any infection by using the same precautions used for avoiding the common cold or flu:  Marland Kitchen Wash your hands often with soap and warm water for at least 20 seconds.  If soap and water are not readily available,  . use an alcohol-based hand sanitizer with at least 60% alcohol.  . If coughing or sneezing, cover your mouth and nose by coughing or sneezing into the elbow areas of your shirt or coat, .  into a tissue or into your sleeve (not your hands). . Avoid shaking hands with others and consider head nods or verbal greetings only. . Avoid touching your eyes, nose, or mouth with unwashed hands.  . Avoid close contact with people who are sick. . Avoid places or events with large  numbers of people in one location, like concerts or sporting events. . Carefully consider travel plans you have or are making. . If you are planning any travel outside or inside the [place], visit the CDC's Travelers' Health webpage for the latest health notices. . If you have some symptoms but not all symptoms, continue to monitor at home and seek medical attention  . if your symptoms worsen. . If you are having a medical emergency, call 911. >>>>>>>>>>>>>>>>>>>>>>>>>>>>>>>>>>>>>>>>>>>>>>>>>>>>>>> We Do NOT Approve of  [facility] [place] Soliciting Our Patients  To Do Home Visits  & We Do NOT Approve of [employer] > > > > > > > > > > > > > > > > > > > > > > > > > > > > > > > > > > >  > > > >   Preventive Care for Adults  A healthy lifestyle and preventive care can promote health and wellness. Preventive health guidelines for men include the following key practices:  A routine yearly physical is a good way to check with your health care provider about your health and preventative screening. It is a chance to share any concerns and updates on your health and to receive a thorough exam.  Visit your dentist for a routine exam and preventative care every 6 months. Brush your teeth twice a day and floss once a day. Good oral hygiene prevents tooth decay and gum disease.  The frequency of eye exams is based on your age, health, family medical history,  use of contact lenses, and other factors. Follow your health care provider's recommendations for frequency of eye exams.  Eat a healthy diet. Foods such as vegetables, fruits, whole grains, low-fat dairy products, and lean protein foods contain the nutrients you need without too many calories. Decrease your intake of foods high in solid fats, added sugars, and salt. Eat the right amount of calories for you. Get information about a proper diet from your health care provider, if necessary.  Regular physical exercise is one of the  most important things you can do for your health. Most adults should get at least 150 minutes of moderate-intensity exercise (any activity that increases your heart rate and causes you to sweat) each week. In addition, most adults need muscle-strengthening exercises on 2 or more days a week.  Maintain a healthy weight. The body mass index (BMI) is a screening tool to identify possible weight problems. It provides an estimate of body fat based on height and weight. Your health care provider can find your BMI and can help you achieve or maintain a healthy weight. For adults 20 years and older:  A BMI below 18.5 is considered underweight.  A BMI of 18.5 to 24.9 is normal.  A BMI of 25 to 29.9 is considered overweight.  A BMI of 30 and above is considered obese.  Maintain normal blood lipids and cholesterol levels by exercising and minimizing your intake of saturated fat. Eat a balanced diet with plenty of fruit and vegetables. Blood tests for lipids and cholesterol should begin at age 54 and be repeated every 5 years. If your lipid or cholesterol levels are high, you are over 50, or you are at high risk for heart disease, you may need your cholesterol levels checked more frequently. Ongoing high lipid and cholesterol levels should be treated with medicines if diet and exercise are not working.  If you smoke, find out from your health care provider how to quit. If you do not use tobacco, do not start.  Lung cancer screening is recommended for adults aged 43-80 years who are at high risk for developing lung cancer because of a history of smoking. A yearly low-dose CT scan of the lungs is recommended for people who have at least a 30-pack-year history of smoking and are a current smoker or have quit within the past 15 years. A pack year of smoking is smoking an average of 1 pack of cigarettes a day for 1 year (for example: 1 pack a day for 30 years or 2 packs a day for 15 years). Yearly screening should  continue until the smoker has stopped smoking for at least 15 years. Yearly screening should be stopped for people who develop a health problem that would prevent them from having lung cancer treatment.  If you choose to drink alcohol, do not have more than 2 drinks per day. One drink is considered to be 12 ounces (355 mL) of beer, 5 ounces (148 mL) of wine, or 1.5 ounces (44 mL) of liquor.  Avoid use of street drugs. Do not share needles with anyone. Ask for help if you need support or instructions about stopping the use of drugs.  High blood pressure causes heart disease and increases the risk of stroke. Your blood pressure should be checked at least every 1-2 years. Ongoing high blood pressure should be treated with medicines, if weight loss and exercise are not effective.  If you are 8-30 years old, ask your health care provider if you  should take aspirin to prevent heart disease.  Diabetes screening involves taking a blood sample to check your fasting blood sugar level. Testing should be considered at a younger age or be carried out more frequently if you are overweight and have at least 1 risk factor for diabetes.  Colorectal cancer can be detected and often prevented. Most routine colorectal cancer screening begins at the age of 11 and continues through age 15. However, your health care provider may recommend screening at an earlier age if you have risk factors for colon cancer. On a yearly basis, your health care provider may provide home test kits to check for hidden blood in the stool. Use of a small camera at the end of a tube to directly examine the colon (sigmoidoscopy or colonoscopy) can detect the earliest forms of colorectal cancer. Talk to your health care provider about this at age 74, when routine screening begins. Direct exam of the colon should be repeated every 5-10 years through age 80, unless early forms of precancerous polyps or small growths are found.  Hepatitis C blood  testing is recommended for all people born from [birth year] through 1966 and any individual with known risks for hepatitis C.  Screening for abdominal aortic aneurysm (AAA)  by ultrasound is recommended for people who have history of high blood pressure or who are current or former smokers.  Healthy men should  receive prostate-specific antigen (PSA) blood tests as part of routine cancer screening. Talk with your health care provider about prostate cancer screening.  Testicular cancer screening is  recommended for adult males. Screening includes self-exam, a health care provider exam, and other screening tests. Consult with your health care provider about any symptoms you have or any concerns you have about testicular cancer.  Use sunscreen. Apply sunscreen liberally and repeatedly throughout the day. You should seek shade when your shadow is shorter than you. Protect yourself by wearing long sleeves, pants, a wide-brimmed hat, and sunglasses year round, whenever you are outdoors.  Once a month, do a whole-body skin exam, using a mirror to look at the skin on your back. Tell your health care provider about new moles, moles that have irregular borders, moles that are larger than a pencil eraser, or moles that have changed in shape or color.  Stay current with required vaccines (immunizations).  Influenza vaccine. All adults should be immunized every year.  Tetanus, diphtheria, and acellular pertussis (Td, Tdap) vaccine. An adult who has not previously received Tdap or who does not know his vaccine status should receive 1 dose of Tdap. This initial dose should be followed by tetanus and diphtheria toxoids (Td) booster doses every 10 years. Adults with an unknown or incomplete history of completing a 3-dose immunization series with Td-containing vaccines should begin or complete a primary immunization series including a Tdap dose. Adults should receive a Td booster every 10 years.  Zoster vaccine. One dose  is recommended for adults aged 45 years or older unless certain conditions are present.    PREVNAR - Pneumococcal 13-valent conjugate (PCV13) vaccine. When indicated, a person who is uncertain of his immunization history and has no record of immunization should receive the PCV13 vaccine. An adult aged 48 years or older who has certain medical conditions and has not been previously immunized should receive 1 dose of PCV13 vaccine. This PCV13 should be followed with a dose of pneumococcal polysaccharide (PPSV23) vaccine. The PPSV23 vaccine dose should be obtained 1 or more year(s)after the dose of PCV13  vaccine. An adult aged 63 years or older who has certain medical conditions and previously received 1 or more doses of PPSV23 vaccine should receive 1 dose of PCV13. The PCV13 vaccine dose should be obtained 1 or more years after the last PPSV23 vaccine dose.    PNEUMOVAX - Pneumococcal polysaccharide (PPSV23) vaccine. When PCV13 is also indicated, PCV13 should be obtained first. All adults aged 49 years and older should be immunized. An adult younger than age 36 years who has certain medical conditions should be immunized. Any person who resides in a nursing home or long-term care facility should be immunized. An adult smoker should be immunized. People with an immunocompromised condition and certain other conditions should receive both PCV13 and PPSV23 vaccines. People with human immunodeficiency virus (HIV) infection should be immunized as soon as possible after diagnosis. Immunization during chemotherapy or radiation therapy should be avoided. Routine use of PPSV23 vaccine is not recommended for American Indians, [place] Natives, or people younger than 65 years unless there are medical conditions that require PPSV23 vaccine. When indicated, people who have unknown immunization and have no record of immunization should receive PPSV23 vaccine. One-time revaccination 5 years after the first dose of PPSV23 is  recommended for people aged 19-64 years who have chronic kidney failure, nephrotic syndrome, asplenia, or immunocompromised conditions. People who received 1-2 doses of PPSV23 before age 22 years should receive another dose of PPSV23 vaccine at age 56 years or later if at least 5 years have passed since the previous dose. Doses of PPSV23 are not needed for people immunized with PPSV23 at or after age 33 years.    Hepatitis A vaccine. Adults who wish to be protected from this disease, have certain high-risk conditions, work with hepatitis A-infected animals, work in hepatitis A research labs, or travel to or work in countries with a high rate of hepatitis A should be immunized. Adults who were previously unvaccinated and who anticipate close contact with an international adoptee during the first 60 days after arrival in the [place] from a country with a high rate of hepatitis A should be immunized.    Hepatitis B vaccine. Adults should be immunized if they wish to be protected from this disease, have certain high-risk conditions, may be exposed to blood or other infectious body fluids, are household contacts or sex partners of hepatitis B positive people, are clients or workers in certain care facilities, or travel to or work in countries with a high rate of hepatitis B.   Preventive Service / Frequency   Ages 68 and over  Blood pressure check.  Lipid and cholesterol check.  Lung cancer screening. / Every year if you are aged 60-80 years and have a 30-pack-year history of smoking and currently smoke or have quit within the past 15 years. Yearly screening is stopped once you have quit smoking for at least 15 years or develop a health problem that would prevent you from having lung cancer treatment.  Fecal occult blood test (FOBT) of stool. You may not have to do this test if you get a colonoscopy every 10 years.  Flexible sigmoidoscopy** or colonoscopy.** / Every 5 years for a flexible  sigmoidoscopy or every 10 years for a colonoscopy beginning at age 38 and continuing until age 26.  Hepatitis C blood test.** / For all people born from [birth year] through 1966 and any individual with known risks for hepatitis C.  Abdominal aortic aneurysm (AAA) screening./ Screening current or former smokers or have Hypertension.  Skin self-exam. / Monthly.  Influenza vaccine. / Every year.  Tetanus, diphtheria, and acellular pertussis (Tdap/Td) vaccine.** / 1 dose of Td every 10 years.   Zoster vaccine.** / 1 dose for adults aged 65 years or older.         Pneumococcal 13-valent conjugate (PCV13) vaccine.    Pneumococcal polysaccharide (PPSV23) vaccine.     Hepatitis A vaccine.** / Consult your health care provider.  Hepatitis B vaccine.** / Consult your health care provider. Screening for abdominal aortic aneurysm (AAA)  by ultrasound is recommended for people who have history of high blood pressure or who are current or former smokers. ++++++++++ Recommend Adult Low Dose Aspirin or  coated  Aspirin 81 mg daily  To reduce risk of Colon Cancer 40 %,  Skin Cancer 26 % ,  Malignant Melanoma 46%  and  Pancreatic cancer 60% ++++++++++++++++++++++ Vitamin D goal  is between 70-100.  Please make sure that you are taking your Vitamin D as directed.  It is very important as a natural anti-inflammatory  helping hair, skin, and nails, as well as reducing stroke and heart attack risk.  It helps your bones and helps with mood. It also decreases numerous cancer risks so please take it as directed.  Low Vit D is associated with a 200-300% higher risk for CANCER  and 200-300% higher risk for HEART   ATTACK  &  STROKE.   .....................................Marland Kitchen It is also associated with higher death rate at younger ages,  autoimmune diseases like Rheumatoid arthritis, Lupus, Multiple Sclerosis.    Also many other serious conditions, like depression, Alzheimer's Dementia, infertility,  muscle aches, fatigue, fibromyalgia - just to name a few. ++++++++++++++++++++++ Recommend the book "The END of DIETING" by Dr Excell Seltzer  & the book "The END of DIABETES " by Dr Excell Seltzer At [email or web address] - get book & Audio CD's    Being diabetic has a  300% increased risk for heart attack, stroke, cancer, and alzheimer- type vascular dementia. It is very important that you work harder with diet by avoiding all foods that are white. Avoid white rice (brown & wild rice is OK), white potatoes (sweetpotatoes in moderation is OK), White bread or wheat bread or anything made out of white flour like bagels, donuts, rolls, buns, biscuits, cakes, pastries, cookies, pizza crust, and pasta (made from white flour & egg whites) - vegetarian pasta or spinach or wheat pasta is OK. Multigrain breads like Arnold's or Pepperidge Farm, or multigrain sandwich thins or flatbreads.  Diet, exercise and weight loss can reverse and cure diabetes in the early stages.  Diet, exercise and weight loss is very important in the control and prevention of complications of diabetes which affects every system in your body, ie. Brain - dementia/stroke, eyes - glaucoma/blindness, heart - heart attack/heart failure, kidneys - dialysis, stomach - gastric paralysis, intestines - malabsorption, nerves - severe painful neuritis, circulation - gangrene & loss of a leg(s), and finally cancer and Alzheimers.    I recommend avoid fried & greasy foods,  sweets/candy, white rice (brown or wild rice or Quinoa is OK), white potatoes (sweet potatoes are OK) - anything made from white flour - bagels, doughnuts, rolls, buns, biscuits,white and wheat breads, pizza crust and traditional pasta made of white flour & egg white(vegetarian pasta or spinach or wheat pasta is OK).  Multi-grain bread is OK - like multi-grain flat bread or sandwich thins. Avoid alcohol in excess. Exercise is also important.    Eat all  the vegetables you want - avoid meat, especially  red meat and dairy - especially cheese.  Cheese is the most concentrated form of trans-fats which is the worst thing to clog up our arteries. Veggie cheese is OK which can be found in the fresh produce section at [employer] or [employer] or [employer]  ++++++++++++++++++++++ DASH Eating Plan  DASH stands for "Dietary Approaches to Stop Hypertension."   The DASH eating plan is a healthy eating plan that has been shown to reduce high blood pressure (hypertension). Additional health benefits may include reducing the risk of type 2 diabetes mellitus, heart disease, and stroke. The DASH eating plan may also help with weight loss. WHAT DO I NEED TO KNOW ABOUT THE DASH EATING PLAN? For the DASH eating plan, you will follow these general guidelines:  Choose foods with a percent daily value for sodium of less than 5% (as listed on the food label).  Use salt-free seasonings or herbs instead of table salt or sea salt.  Check with your health care provider or pharmacist before using salt substitutes.  Eat lower-sodium products, often labeled as "lower sodium" or "no salt added."  Eat fresh foods.  Eat more vegetables, fruits, and low-fat dairy products.  Choose whole grains. Look for the word "whole" as the first word in the ingredient list.  Choose fish   Limit sweets, desserts, sugars, and sugary drinks.  Choose heart-healthy fats.  Eat veggie cheese   Eat more home-cooked food and less restaurant, buffet, and fast food.  Limit fried foods.  Cook foods using methods other than frying.  Limit canned vegetables. If you do use them, rinse them well to decrease the sodium.  When eating at a restaurant, ask that your food be prepared with less salt, or no salt if possible.                      WHAT FOODS CAN I EAT? Read Dr Fara Olden Fuhrman's books on The End of Dieting & The End of Diabetes  Grains Whole grain or whole wheat bread. Brown rice. Whole grain or whole wheat pasta. Quinoa,  bulgur, and whole grain cereals. Low-sodium cereals. Corn or whole wheat flour tortillas. Whole grain cornbread. Whole grain crackers. Low-sodium crackers.  Vegetables Fresh or frozen vegetables (raw, steamed, roasted, or grilled). Low-sodium or reduced-sodium tomato and vegetable juices. Low-sodium or reduced-sodium tomato sauce and paste. Low-sodium or reduced-sodium canned vegetables.   Fruits All fresh, canned (in natural juice), or frozen fruits.  Protein Products  All fish and seafood.  Dried beans, peas, or lentils. Unsalted nuts and seeds. Unsalted canned beans.  Dairy Low-fat dairy products, such as skim or 1% milk, 2% or reduced-fat cheeses, low-fat ricotta or cottage cheese, or plain low-fat yogurt. Low-sodium or reduced-sodium cheeses.  Fats and Oils Tub margarines without trans fats. Light or reduced-fat mayonnaise and salad dressings (reduced sodium). Avocado. Safflower, olive, or canola oils. Natural peanut or almond butter.  Other Unsalted popcorn and pretzels. The items listed above may not be a complete list of recommended foods or beverages. Contact your dietitian for more options.  ++++++++++++++++++++  WHAT FOODS ARE NOT RECOMMENDED? Grains/ White flour or wheat flour White bread. White pasta. White rice. Refined cornbread. Bagels and croissants. Crackers that contain trans fat.  Vegetables  Creamed or fried vegetables. Vegetables in a . Regular canned vegetables. Regular canned tomato sauce and paste. Regular tomato and vegetable juices.  Fruits Dried fruits. Canned fruit in light or  heavy syrup. Fruit juice.  Meat and Other Protein Products Meat in general - RED meat & White meat.  Fatty cuts of meat. Ribs, chicken wings, all processed meats as bacon, sausage, bologna, salami, fatback, hot dogs, bratwurst and packaged luncheon meats.  Dairy Whole or 2% milk, cream, half-and-half, and cream cheese. Whole-fat or sweetened yogurt. Full-fat cheeses or blue  cheese. Non-dairy creamers and whipped toppings. Processed cheese, cheese spreads, or cheese curds.  Condiments Onion and garlic salt, seasoned salt, table salt, and sea salt. Canned and packaged gravies. Worcestershire sauce. Tartar sauce. Barbecue sauce. Teriyaki sauce. Soy sauce, including reduced sodium. Steak sauce. Fish sauce. Oyster sauce. Cocktail sauce. Horseradish. Ketchup and mustard. Meat flavorings and tenderizers. Bouillon cubes. Hot sauce. Tabasco sauce. Marinades. Taco seasonings. Relishes.  Fats and Oils Butter, stick margarine, lard, shortening and bacon fat. Coconut, palm kernel, or palm oils. Regular salad dressings.  Pickles and olives. Salted popcorn and pretzels.  The items listed above may not be a complete list of foods and beverages to avoid.

## 2020-08-31 LAB — LIPID PANEL
Cholesterol: 201 mg/dL — ABNORMAL HIGH (ref <200)
HDL: 34 mg/dL — ABNORMAL LOW (ref >=40)
Non-HDL Cholesterol (Calc): 167 mg/dL (calc) — ABNORMAL HIGH (ref <130)
Total CHOL/HDL Ratio: 5.9 (calc) — ABNORMAL HIGH (ref <5.0)
Triglycerides: 485 mg/dL — ABNORMAL HIGH (ref <150)

## 2020-08-31 LAB — CBC WITH DIFFERENTIAL/PLATELET
Absolute Monocytes: 395 cells/uL (ref 200–950)
Basophils Absolute: 30 cells/uL (ref 0–200)
Basophils Relative: 0.5 %
Eosinophils Absolute: 171 cells/uL (ref 15–500)
Eosinophils Relative: 2.9 %
HCT: 42.8 % (ref 38.5–50.0)
Hemoglobin: 14.7 g/dL (ref 13.2–17.1)
Lymphs Abs: 2885 cells/uL (ref 850–3900)
MCH: 29.3 pg (ref 27.0–33.0)
MCHC: 34.3 g/dL (ref 32.0–36.0)
MCV: 85.4 fL (ref 80.0–100.0)
MPV: 10.7 fL (ref 7.5–12.5)
Monocytes Relative: 6.7 %
Neutro Abs: 2419 cells/uL (ref 1500–7800)
Neutrophils Relative %: 41 %
Platelets: 194 10*3/uL (ref 140–400)
RBC: 5.01 10*6/uL (ref 4.20–5.80)
RDW: 12.8 % (ref 11.0–15.0)
Total Lymphocyte: 48.9 %
WBC: 5.9 10*3/uL (ref 3.8–10.8)

## 2020-08-31 LAB — COMPLETE METABOLIC PANEL WITH GFR
AG Ratio: 1.5 (calc) (ref 1.0–2.5)
ALT: 38 U/L (ref 9–46)
AST: 25 U/L (ref 10–35)
Albumin: 4.3 g/dL (ref 3.6–5.1)
Alkaline phosphatase (APISO): 49 U/L (ref 35–144)
BUN: 16 mg/dL (ref 7–25)
CO2: 27 mmol/L (ref 20–32)
Calcium: 9.8 mg/dL (ref 8.6–10.3)
Chloride: 103 mmol/L (ref 98–110)
Creat: 0.94 mg/dL (ref 0.70–1.25)
GFR, Est African American: 98 mL/min/{1.73_m2} (ref >=60)
GFR, Est Non African American: 85 mL/min/{1.73_m2} (ref >=60)
Globulin: 2.8 g/dL (calc) (ref 1.9–3.7)
Glucose, Bld: 160 mg/dL — ABNORMAL HIGH (ref 65–99)
Potassium: 4.2 mmol/L (ref 3.5–5.3)
Sodium: 137 mmol/L (ref 135–146)
Total Bilirubin: 0.8 mg/dL (ref 0.2–1.2)
Total Protein: 7.1 g/dL (ref 6.1–8.1)

## 2020-08-31 LAB — INSULIN, RANDOM: Insulin: 34.1 u[IU]/mL — ABNORMAL HIGH

## 2020-08-31 LAB — HEMOGLOBIN A1C
Hgb A1c MFr Bld: 7.9 % of total Hgb — ABNORMAL HIGH (ref <5.7)
Mean Plasma Glucose: 180 mg/dL
eAG (mmol/L): 10 mmol/L

## 2020-08-31 LAB — VITAMIN D 25 HYDROXY (VIT D DEFICIENCY, FRACTURES): Vit D, 25-Hydroxy: 29 ng/mL — ABNORMAL LOW (ref 30–100)

## 2020-08-31 LAB — MAGNESIUM: Magnesium: 1.6 mg/dL (ref 1.5–2.5)

## 2020-08-31 LAB — TSH: TSH: 2.49 mIU/L (ref 0.40–4.50)

## 2020-08-31 NOTE — Progress Notes (Signed)
========================================================== - Test results slightly outside the reference range are not unusual. If there is anything important, I will review this with you,  otherwise it is considered normal test values.  If you have further questions,  please do not hesitate to contact me at the office or via My Chart.  ========================================================== ==========================================================  -  Glucose was 160 mg % - too high  (Ideal or Goal is less than 120)  - And   - A1c = 7.9% - Also too high  (Ideal or Goal is less than 5.7%)   - So . . .  . Recommend that you Increase your Metformin from                                                                 1 up to 2 tablets       2 x  /day                                                                    (  total = 4 tablets /day  )          - So your prescription will run  out quicker in 1/2 time                                                                        - so call then for more pills                                                     ========================================================== ==========================================================  -  Vitamin D = 29 - Extremely Low   - Vitamin D goal is between 70-100.   jjjjjjjjjjjjjjjjjjjjjjjjjjjjjjjjjjjjjjjjjjjjjjjjjjjjjjjjjjjjjjjjjjjjjjjjjjjjjjjjjjjjjjjjjjjjjjjjjjjjjjjjjjjjjjjjjjjjjjjjjjjjjjjjjjjjjjjjjjjjjjjjjjjjjjjj   - Please INCREASE your Vitamin D 5,000 unit capsules up to                                                                       2 capsules = 10,000 units /day !  jjjjjjjjjjjjjjjjjjjjjjjjjjjjjjjjjjjjjjjjjjjjjjjjjjjjjjjjjjjjjjjjjjjjjjjjjjjjjjjjjjjjjjjjjjjjjjjjjjjjjjjjjjjjjjjjjjjjjjjjjjjjjjjjjjjjjjjjjjjjjjjjjjjjjjjj   - It is very important as a natural anti-inflammatory and helping the  immune system protect against viral infections, like the Covid-19    helping hair, skin, and  nails, as well as reducing stroke and  heart attack risk.   - It helps your bones and helps with mood.  - It also decreases numerous cancer risks so please  take it  as directed.   - Low Vit D is associated with a 200-300% higher risk for  CANCER   and 200-300% higher risk for HEART   ATTACK  &  STROKE.    - It is also associated with higher death rate at younger ages,   autoimmune diseases like Rheumatoid arthritis, Lupus,  Multiple Sclerosis.     - Also many other serious conditions, like depression, Alzheimer's  Dementia, infertility, muscle aches, fatigue, fibromyalgia   - just to name a few. ==========================================================  - All Else - CBC - Kidneys - Electrolytes - Liver - Magnesium & Thyroid    - all  Normal / OK ===========================================================

## 2020-09-03 MED FILL — GABAPENTIN 300 MG CAPSULE: 300 | 90 days supply | Qty: 270 | Fill #1

## 2020-09-12 ENCOUNTER — Other Ambulatory Visit: Payer: Self-pay

## 2020-09-12 ENCOUNTER — Other Ambulatory Visit: Payer: Self-pay | Admitting: Adult Health Nurse Practitioner

## 2020-09-12 MED ORDER — OMEPRAZOLE 20 MG PO CPDR: 20.0000 mg | DELAYED_RELEASE_CAPSULE | Freq: Every day | ORAL | 2 refills | Status: DC

## 2020-09-12 MED FILL — OMEPRAZOLE DR 20 MG CAPSULE: 20 | 90 days supply | Qty: 90 | Fill #0

## 2020-09-12 NOTE — Telephone Encounter (Signed)
Refill on OMEPRAZOLE 20 mgs capsule

## 2020-09-14 ENCOUNTER — Other Ambulatory Visit: Payer: Self-pay | Admitting: Internal Medicine

## 2020-09-14 ENCOUNTER — Other Ambulatory Visit: Payer: Self-pay | Admitting: Adult Health Nurse Practitioner

## 2020-09-14 ENCOUNTER — Other Ambulatory Visit: Payer: Self-pay

## 2020-09-14 ENCOUNTER — Other Ambulatory Visit: Payer: Self-pay | Admitting: *Deleted

## 2020-09-14 ENCOUNTER — Telehealth: Payer: Self-pay | Admitting: *Deleted

## 2020-09-14 DIAGNOSIS — R251 Tremor, unspecified: Secondary | ICD-10-CM

## 2020-09-14 MED ORDER — LOSARTAN POTASSIUM 50 MG PO TABS: 50.0000 mg | ORAL_TABLET | Freq: Every day | ORAL | 11 refills | Status: DC

## 2020-09-14 MED ORDER — CITALOPRAM HYDROBROMIDE 40 MG PO TABS: 40.0000 mg | ORAL_TABLET | Freq: Every day | ORAL | 2 refills | Status: DC

## 2020-09-14 MED FILL — clonazePAM 1 MG TABS: 1 | 90 days supply | Qty: 90 | Fill #0

## 2020-09-14 MED FILL — CITALOPRAM HBR 40 MG TABLET: 40 | 90 days supply | Qty: 90 | Fill #0

## 2020-09-14 MED FILL — LOSARTAN POTASSIUM 50 MG TA: 50 | 30 days supply | Qty: 30 | Fill #4

## 2020-09-14 NOTE — Telephone Encounter (Signed)
Refill order sent to provider for approval on KLONOPIN.  CITALOPRAM & LOSARTAN was refilled as well

## 2020-09-14 NOTE — Telephone Encounter (Signed)
Spouse infromed refills sent to pharmacy.

## 2020-09-15 ENCOUNTER — Other Ambulatory Visit: Payer: Self-pay | Admitting: Adult Health Nurse Practitioner

## 2020-09-15 DIAGNOSIS — R251 Tremor, unspecified: Secondary | ICD-10-CM

## 2020-09-15 MED ORDER — CITALOPRAM HYDROBROMIDE 40 MG PO TABS: 40.0000 mg | ORAL_TABLET | Freq: Every day | ORAL | 2 refills | Status: DC

## 2020-09-15 MED ORDER — CLONAZEPAM 1 MG PO TABS: ORAL_TABLET | ORAL | 0 refills | Status: DC

## 2020-09-19 DIAGNOSIS — H52203 Unspecified astigmatism, bilateral: Secondary | ICD-10-CM | POA: Diagnosis not present

## 2020-09-19 LAB — HM DIABETES EYE EXAM

## 2020-09-20 ENCOUNTER — Encounter: Payer: Self-pay | Admitting: *Deleted

## 2020-10-06 DIAGNOSIS — L570 Actinic keratosis: Secondary | ICD-10-CM | POA: Diagnosis not present

## 2020-10-06 DIAGNOSIS — L738 Other specified follicular disorders: Secondary | ICD-10-CM | POA: Diagnosis not present

## 2020-10-06 DIAGNOSIS — Z85828 Personal history of other malignant neoplasm of skin: Secondary | ICD-10-CM | POA: Diagnosis not present

## 2020-10-11 ENCOUNTER — Ambulatory Visit (INDEPENDENT_AMBULATORY_CARE_PROVIDER_SITE_OTHER): Payer: 59

## 2020-10-11 ENCOUNTER — Ambulatory Visit: Payer: 59 | Admitting: Orthopaedic Surgery

## 2020-10-11 DIAGNOSIS — M25562 Pain in left knee: Secondary | ICD-10-CM

## 2020-10-11 DIAGNOSIS — M25522 Pain in left elbow: Secondary | ICD-10-CM

## 2020-10-11 DIAGNOSIS — G8929 Other chronic pain: Secondary | ICD-10-CM | POA: Diagnosis not present

## 2020-10-11 MED ORDER — METHYLPREDNISOLONE ACETATE 40 MG/ML IJ SUSP
40.0000 mg | INTRAMUSCULAR | Status: AC | PRN
  Administered 2020-10-11: 40 mg via INTRA_ARTICULAR

## 2020-10-11 MED ORDER — LIDOCAINE HCL 1 % IJ SOLN
2.0000 mL | INTRAMUSCULAR | Status: AC | PRN
  Administered 2020-10-11: 2 mL

## 2020-10-11 MED ORDER — BUPIVACAINE HCL 0.5 % IJ SOLN
2.0000 mL | INTRAMUSCULAR | Status: AC | PRN
  Administered 2020-10-11: 2 mL via INTRA_ARTICULAR

## 2020-10-11 NOTE — Progress Notes (Signed)
Office Visit Note   Patient: Glenn Stephens           Date of Birth: 1955/12/23           MRN: [id number] Visit Date: 10/11/2020              Requested by: Unk Pinto, [address] [place],   [id number] PCP: Unk Pinto, MD   Assessment & Plan: Visit Diagnoses:  1. Pain in left elbow   2. 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  For the left arm he is doing quite well and I do not think he needs any physical therapy or any attention.  He mainly wants reassurance.  He does not lack any strength.  For the left knee treatment options discussed and he elected to try cortisone injection today.  Handout for [facility] was also provided.  He will also do a better job of diabetes management.  We will see him back as needed.  Follow-Up Instructions: Return if symptoms worsen or fail to improve.   Orders:  Orders Placed This Encounter  Procedures  . XR Elbow 2 Views Left  . XR KNEE 3 VIEW LEFT   No orders of the defined types were placed in this encounter.     Procedures: Large Joint Inj: L knee on 10/11/2020 9:43 AM Details: 22 G needle Medications: 2 mL bupivacaine 0.5 %; 2 mL lidocaine 1 %; 40 mg methylPREDNISolone acetate 40 MG/ML Outcome: tolerated well, no immediate complications Patient was prepped and draped in the usual sterile fashion.       Clinical Data: No additional findings.   Subjective: Chief Complaint  Patient presents with  . Left Elbow - Pain    Drake is a very pleasant 65 year old gentleman husband Glenn Stephens.  He knot on his left arm about a month ago denies any injuries.  He was told that it was a torn biceps.  He is a Geophysicist/field seismologist for [employer].  He has some pain with certain movements but denies any numbness and tingling or bruising.  He is right-hand dominant.  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  He does have some limping and swelling throughout the day.  He has tried taking some over-the-counter NSAIDs with temporary relief.   Review of Systems  Constitutional: Negative.   All other systems reviewed and are negative.    Objective: Vital Signs: There were no vitals taken for this visit.  Physical Exam Vitals and nursing note reviewed.  Constitutional:      Appearance: He is well-developed.  HENT:     Head: Normocephalic and atraumatic.  Eyes:     Pupils: Pupils are equal, round, and reactive to light.  Pulmonary:     Effort: Pulmonary effort is normal.  Abdominal:     Palpations: Abdomen is soft.  Musculoskeletal:        General: Normal range of motion.     Cervical back: Neck supple.  Skin:    General: Skin is warm.  Neurological:     Mental Status: He is alert and oriented to person, place, and time.  Psychiatric:        Behavior: Behavior normal.        Thought Content: Thought content normal.        Judgment: Judgment normal.  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  He has good shoulder and elbow range of motion without significant discomfort.  He has good strength elbow flexion and forearm supination.  Left knee shows mild crepitus with range of motion.  Slight medial joint line tenderness.  Collaterals and cruciates are stable.  No joint effusion. Specialty Comments:  No specialty comments available.  Imaging: XR Elbow 2 Views Left  Result Date: 10/11/2020 Small olecranon spur at the insertion of the triceps.  No acute abnormalities.  XR KNEE 3 VIEW LEFT  Result Date: 10/11/2020 Mild osteoarthritis of the left knee.    PMFS History: Patient Active Problem List   Diagnosis Date Noted  . Morbid obesity ([place]) 01/22/2020  . Lesion of pancreas 06/18/2018  . History of colonic polyps 06/18/2018  . Type 2 diabetes mellitus ([place]) 01/13/2018  . History of posttraumatic  stress disorder (PTSD) 06/25/2016  . RLS (restless legs syndrome) 06/25/2016  . Abnormal dreams 04/17/2016  . PLMD (periodic limb movement disorder) 04/17/2016  . Diabetic peripheral neuropathy associated with type 2 diabetes mellitus ([place]) 03/27/2016  . Generalized anxiety disorder 03/24/2015  . History of SCC (squamous cell carcinoma) of skin 12/28/2014  . OSA on CPAP 08/03/2013  . Vitamin D deficiency   . Hypertension   . Hyperlipidemia   . GERD (gastroesophageal reflux disease)   . Varicose veins of both lower extremities 02/17/2013   Past Medical History:  Diagnosis Date  . Acute calculous cholecystitis s/p lap cholecystectopmy 05/23/2018 05/22/2018  . Cancer ([place])    skin cancer - basal cell  . Diabetes mellitus without complication ([place])   . Full dentures   . GERD (gastroesophageal reflux disease)   . Heart murmur    no problems per pt  . Hernia, abdominal   . Hyperlipidemia   . Hypertension   . OSA (obstructive sleep apnea) 08/03/2013   pt does not use CPAP  . Pneumonia    as a small child only  . Superficial thrombophlebitis of left upper extremity 06/18/2018  . Unspecified vitamin D deficiency   . Wears glasses     Family History  Problem Relation Age of Onset  . Cancer Mother        melanoma with mets .  multiple endocrine neoplasia, MEN 1, in sister as well  . Heart disease Father   . Hyperlipidemia Father   . Diabetes Sister        prothrombin gene mutations, hypercoagulable disorder in sister and her kids.    . Hypertension Brother   . Stomach cancer Maternal Grandfather   . Colon cancer Neg Hx   . Colon polyps Neg Hx   . Esophageal cancer Neg Hx   . Rectal cancer Neg Hx   . Inflammatory bowel disease Neg Hx   . Liver disease Neg Hx   . Pancreatic cancer Neg Hx     Past Surgical History:  Procedure Laterality Date  . BIOPSY  07/23/2018   Procedure: BIOPSY;  Surgeon: Rush Landmark Telford Nab., MD;  Location: [place];  Service: Gastroenterology;;   . BIOPSY  08/13/2018   Procedure: BIOPSY;  Surgeon: Irving Copas., MD;  [facility] END[facility]COPY;  Service: Gastroenterology;;  . CHOLECYSTECTOMY N/A 05/23/2018   Procedure: LAPAROSCOPIC CHOLECYSTECTOMY;  Surgeon: Clovis Riley, MD;  Location: [place];  Service: General;  Laterality: N/A;  . ENDOSCOPIC RETROGRADE CHOLANGIOPANCREATOGRAPHY (ERCP) WITH PROPOFOL N/A 08/13/2018   Procedure: ENDOSCOPIC RETROGRADE CHOLANGIOPANCREATOGRAPHY (ERCP) WITH PROPOFOL;  Surgeon:  Mansouraty, Telford Nab., MD;  Location: [place];  Service: Gastroenterology;  Laterality: N/A;  . ESOPHAGOGASTRODUODENOSCOPY (EGD) WITH PROPOFOL N/A 07/23/2018   Procedure: ESOPHAGOGASTRODUODENOSCOPY (EGD) WITH PROPOFOL;  Surgeon: Rush Landmark Telford Nab., MD;  Location: [place];  Service: Gastroenterology;  Laterality: N/A;  . LASER ABLATION     veins  . METATARSAL OSTEOTOMY WITH BUNIONECTOMY    . MULTIPLE TOOTH EXTRACTIONS    . orthopedic surgeries     knee,foot  . REMOVAL OF STONES  08/13/2018   Procedure: REMOVAL OF STONES;  Surgeon: Rush Landmark Telford Nab., MD;  Location: [place];  Service: Gastroenterology;;  . Joan Mayans  08/13/2018   Procedure: Joan Mayans;  Surgeon: Irving Copas., MD;  Location: [facility];  Service: Gastroenterology;;  . UPPER ESOPHAGEAL ENDOSCOPIC ULTRASOUND (EUS) N/A 07/23/2018   Procedure: UPPER ESOPHAGEAL ENDOSCOPIC ULTRASOUND (EUS);  Surgeon: Irving Copas., MD;  Location: [place];  Service: Gastroenterology;  Laterality: N/A;  . VASECTOMY     Social History   Occupational History  . Not on file  Tobacco Use  . Smoking status: Former Smoker    Quit date: 06/24/1993    Years since quitting: 27.3  . Smokeless tobacco: Former Systems developer    Types: Secondary school teacher  . Vaping Use: Never used  Substance and Sexual Activity  . Alcohol use: No  . Drug use: No  . Sexual activity: Not on file

## 2020-10-21 ENCOUNTER — Other Ambulatory Visit (HOSPITAL_COMMUNITY): Payer: Self-pay

## 2020-10-21 MED FILL — Losartan Potassium Tab 50 MG: ORAL | 90 days supply | Qty: 90 | Fill #0 | Status: AC

## 2020-10-28 ENCOUNTER — Other Ambulatory Visit: Payer: Self-pay | Admitting: Physician Assistant

## 2020-10-29 MED ORDER — SYNJARDY XR 25-1000 MG PO TB24
1.0000 | ORAL_TABLET | Freq: Every day | ORAL | 1 refills | Status: DC
  Filled 2020-10-29: qty 90, 90d supply, fill #0

## 2020-10-31 ENCOUNTER — Other Ambulatory Visit (HOSPITAL_COMMUNITY): Payer: Self-pay

## 2020-11-01 ENCOUNTER — Other Ambulatory Visit (HOSPITAL_COMMUNITY): Payer: Self-pay

## 2020-11-24 DIAGNOSIS — Z85828 Personal history of other malignant neoplasm of skin: Secondary | ICD-10-CM | POA: Diagnosis not present

## 2020-11-24 DIAGNOSIS — D0461 Carcinoma in situ of skin of right upper limb, including shoulder: Secondary | ICD-10-CM | POA: Diagnosis not present

## 2020-11-24 DIAGNOSIS — L821 Other seborrheic keratosis: Secondary | ICD-10-CM | POA: Diagnosis not present

## 2020-11-24 DIAGNOSIS — C44319 Basal cell carcinoma of skin of other parts of face: Secondary | ICD-10-CM | POA: Diagnosis not present

## 2020-11-24 DIAGNOSIS — L570 Actinic keratosis: Secondary | ICD-10-CM | POA: Diagnosis not present

## 2020-12-03 MED FILL — Omeprazole Cap Delayed Release 20 MG: ORAL | 90 days supply | Qty: 90 | Fill #0 | Status: AC

## 2020-12-06 ENCOUNTER — Other Ambulatory Visit: Payer: Self-pay | Admitting: Adult Health

## 2020-12-06 ENCOUNTER — Other Ambulatory Visit: Payer: Self-pay | Admitting: Adult Health Nurse Practitioner

## 2020-12-06 ENCOUNTER — Other Ambulatory Visit (HOSPITAL_COMMUNITY): Payer: Self-pay

## 2020-12-06 DIAGNOSIS — R251 Tremor, unspecified: Secondary | ICD-10-CM

## 2020-12-06 MED ORDER — CLONAZEPAM 1 MG PO TABS
1.0000 mg | ORAL_TABLET | Freq: Every evening | ORAL | 0 refills | Status: DC
  Filled 2020-12-06 – 2020-12-14 (×2): qty 90, 90d supply, fill #0

## 2020-12-06 MED FILL — Citalopram Hydrobromide Tab 40 MG (Base Equiv): ORAL | 90 days supply | Qty: 90 | Fill #0 | Status: AC

## 2020-12-15 ENCOUNTER — Ambulatory Visit: Payer: 59 | Admitting: Internal Medicine

## 2020-12-15 ENCOUNTER — Other Ambulatory Visit: Payer: Self-pay

## 2020-12-15 ENCOUNTER — Other Ambulatory Visit (HOSPITAL_COMMUNITY): Payer: Self-pay

## 2020-12-15 ENCOUNTER — Encounter: Payer: Self-pay | Admitting: Internal Medicine

## 2020-12-15 VITALS — BP 118/70 | HR 65 | Temp 97.5°F | Resp 16 | Ht 72.0 in | Wt 237.0 lb

## 2020-12-15 DIAGNOSIS — E1122 Type 2 diabetes mellitus with diabetic chronic kidney disease: Secondary | ICD-10-CM | POA: Diagnosis not present

## 2020-12-15 DIAGNOSIS — K219 Gastro-esophageal reflux disease without esophagitis: Secondary | ICD-10-CM | POA: Diagnosis not present

## 2020-12-15 DIAGNOSIS — G2581 Restless legs syndrome: Secondary | ICD-10-CM

## 2020-12-15 DIAGNOSIS — N181 Chronic kidney disease, stage 1: Secondary | ICD-10-CM

## 2020-12-15 DIAGNOSIS — I10 Essential (primary) hypertension: Secondary | ICD-10-CM | POA: Diagnosis not present

## 2020-12-15 DIAGNOSIS — Z9989 Dependence on other enabling machines and devices: Secondary | ICD-10-CM

## 2020-12-15 DIAGNOSIS — E559 Vitamin D deficiency, unspecified: Secondary | ICD-10-CM

## 2020-12-15 DIAGNOSIS — E1169 Type 2 diabetes mellitus with other specified complication: Secondary | ICD-10-CM

## 2020-12-15 DIAGNOSIS — G4733 Obstructive sleep apnea (adult) (pediatric): Secondary | ICD-10-CM

## 2020-12-15 DIAGNOSIS — Z79899 Other long term (current) drug therapy: Secondary | ICD-10-CM | POA: Diagnosis not present

## 2020-12-15 DIAGNOSIS — E785 Hyperlipidemia, unspecified: Secondary | ICD-10-CM

## 2020-12-15 MED ORDER — ROPINIROLE HCL 1 MG PO TABS
ORAL_TABLET | ORAL | 0 refills | Status: DC
Start: 2020-12-15 — End: 2021-05-16
  Filled 2020-12-15: qty 90, 90d supply, fill #0

## 2020-12-15 MED ORDER — OZEMPIC (0.25 OR 0.5 MG/DOSE) 2 MG/1.5ML ~~LOC~~ SOPN
0.5000 mg | PEN_INJECTOR | SUBCUTANEOUS | 0 refills | Status: DC
  Filled 2020-12-15: qty 1.5, 28d supply, fill #0

## 2020-12-15 NOTE — Patient Instructions (Signed)

## 2020-12-15 NOTE — Progress Notes (Signed)
Future Appointments  Date Time Provider Barry  12/15/2020 10:30 AM Unk Pinto, MD [facility] None  05/25/2021 - CPE  2:00 PM Unk Pinto, MD [facility] None    History of Present Illness:       This very nice 65 y.o. MWM presents for 3 month follow up with HTN, HLD, T2_NIDDM, GERD and Vitamin D Deficiency.  Patient has OSA on CPAP        Patient is treated for HTN  (2005) & BP has been controlled at home. Today's BP is at goal - 118/70. Patient has had no complaints of any cardiac type chest pain, palpitations, dyspnea / orthopnea / PND, dizziness, claudication, or dependent edema.       Hyperlipidemia is controlled with diet & meds. Patient denies myalgias or other med SE's. Last Lipids were not at goal with  elevated Trig's:  Lab Results  Component Value Date   CHOL 201 (H) 08/30/2020   HDL 34 (L) 08/30/2020   LDLCALC not calculated. 08/30/2020   TRIG 485 (H) 08/30/2020   CHOLHDL 5.9 (H) 08/30/2020     Also, the patient has history of T2_NIDDM (2008) and has had no symptoms of reactive hypoglycemia, diabetic polys, paresthesias or visual blurring.  Last A1c was not at goal:  Lab Results  Component Value Date   HGBA1C 7.9 (H) 08/30/2020        Further, the patient also has history of Vitamin D Deficiency and does not supplement as recommended. Last vitamin D was still very low:   Lab Results  Component Value Date   VD25OH 12 (L) 08/30/2020     Current Outpatient Medications on File Prior to Visit  Medication Sig   aspirin 81 MG tablet Take daily.   citalopram (CELEXA) 40 MG tablet TAKE 1 TABLET DAILY.   clonazePAM (KLONOPIN) 1 MG tablet TAKE 1 TABLET NIGHTLY    Empagliflozin-metFORMIN HCl ER (SYNJARDY XR) 25-1000 MG TB24 TAKE 1 TABLET DAILY.   gabapentin (NEURONTIN) 300 MG capsule TAKE 3 CAPSULES AT NIGHT    ibuprofen 200 MG tablet Take 600 mg every 6 hours as needed for headache   losartan (COZAAR) 50 MG tablet TAKE 1 TABLET  DAILY.   omeprazole (PRILOSEC) 20 MG capsule TAKE 1 CAPSULE DAILY.   rosuvastatin (CRESTOR) 5 MG tablet Take 1 tablet at bedtime.   TURMERIC  Take 1 capsule by mouth 2 (two) times daily.    Cholecalciferol 125 MCG (5000 UT) capsule Take 5,000 Units by mouth daily. (Patient not taking: Reported on 12/15/2020)   ezetimibe (ZETIA) 10 MG tablet Patient not taking: Reported on 12/15/2020)   Magnesium 100 MG CAPS Patient not taking: Reported on 12/15/2020)   metFORMIN -XR 500 MG 24 hr tablet TAKE 1 TABLET 2 TIMES A DAY. (Patient not taking: Reported on 12/15/2020)     Allergies  Allergen Reactions   Caduet [Amlodipine-Atorvastatin] Hives and Itching   Statins     BLURRED VISION    PMHx:   Past Medical History:  Diagnosis Date   Acute calculous cholecystitis s/p lap cholecystectopmy 05/23/2018 05/22/2018   Cancer ([place])    skin cancer - basal cell   Diabetes mellitus without complication (HCC)    Full dentures    GERD (gastroesophageal reflux disease)    Heart murmur    no problems per pt   Hernia, abdominal    Hyperlipidemia    Hypertension    OSA (obstructive sleep apnea) 08/03/2013   pt  does not use CPAP   Pneumonia    as a small child only   Superficial thrombophlebitis of left upper extremity 06/18/2018   Unspecified vitamin D deficiency    Wears glasses     Immunization History  Administered Date(s) Administered   Influenza -  05/07/2013   Pneumococcal - 23 06/24/2013   Td 07/09/2006    Past Surgical History:  Procedure Laterality Date   BIOPSY  07/23/2018   Procedure: BIOPSY;  Surgeon: Irving Copas., MD;  Location: [place];  Service: Gastroenterology;;   BIOPSY  08/13/2018   Procedure: BIOPSY;  Surgeon: Irving Copas., MD;  Location: [facility];  Service: Gastroenterology;;   CHOLECYSTECTOMY N/A 05/23/2018   Procedure: LAPAROSCOPIC CHOLECYSTECTOMY;  Surgeon: Clovis Riley, MD;  Location: [place];  Service: General;  Laterality: N/A;   ENDOSCOPIC  RETROGRADE CHOLANGIOPANCREATOGRAPHY (ERCP) WITH PROPOFOL N/A 08/13/2018   Procedure: ENDOSCOPIC RETROGRADE CHOLANGIOPANCREATOGRAPHY (ERCP) WITH PROPOFOL;  Surgeon: Irving Copas., MD;  Location: [place];  Service: Gastroenterology;  Laterality: N/A;   ESOPHAGOGASTRODUODENOSCOPY (EGD) WITH PROPOFOL N/A 07/23/2018   Procedure: ESOPHAGOGASTRODUODENOSCOPY (EGD) WITH PROPOFOL;  Surgeon: Rush Landmark Telford Nab., MD;  Location: [place];  Service: Gastroenterology;  Laterality: N/A;   LASER ABLATION     veins   METATARSAL OSTEOTOMY WITH BUNIONECTOMY     MULTIPLE TOOTH EXTRACTIONS     orthopedic surgeries     knee,foot   REMOVAL OF STONES  08/13/2018   Procedure: REMOVAL OF STONES;  Surgeon: Rush Landmark Telford Nab., MD;  Location: [place];  Service: Gastroenterology;;   Joan Mayans  08/13/2018   Procedure: Joan Mayans;  Surgeon: Irving Copas., MD;  Location: [place];  Service: Gastroenterology;;   UPPER ESOPHAGEAL ENDOSCOPIC ULTRASOUND (EUS) N/A 07/23/2018   Procedure: UPPER ESOPHAGEAL ENDOSCOPIC ULTRASOUND (EUS);  Surgeon: Irving Copas., MD;  Location: [place];  Service: Gastroenterology;  Laterality: N/A;   VASECTOMY      FHx:    Reviewed / unchanged  SHx:    Reviewed / unchanged   Systems Review:  Constitutional: Denies fever, chills, wt changes, headaches, insomnia, fatigue, night sweats, change in appetite. Eyes: Denies redness, blurred vision, diplopia, discharge, itchy, watery eyes.  ENT: Denies discharge, congestion, post nasal drip, epistaxis, sore throat, earache, hearing loss, dental pain, tinnitus, vertigo, sinus pain, snoring.  CV: Denies chest pain, palpitations, irregular heartbeat, syncope, dyspnea, diaphoresis, orthopnea, PND, claudication or edema. Respiratory: denies cough, dyspnea, DOE, pleurisy, hoarseness, laryngitis, wheezing.  Gastrointestinal: Denies dysphagia, odynophagia, heartburn, reflux, water brash, abdominal pain  or cramps, nausea, vomiting, bloating, diarrhea, constipation, hematemesis, melena, hematochezia  or hemorrhoids. Genitourinary: Denies dysuria, frequency, urgency, nocturia, hesitancy, discharge, hematuria or flank pain. Musculoskeletal: Denies arthralgias, myalgias, stiffness, jt. swelling, pain, limping or strain/sprain.  Skin: Denies pruritus, rash, hives, warts, acne, eczema or change in skin lesion(s). Neuro: No weakness, tremor, incoordination, spasms, paresthesia or pain. Psychiatric: Denies confusion, memory loss or sensory loss. Endo: Denies change in weight, skin or hair change.  Heme/Lymph: No excessive bleeding, bruising or enlarged lymph nodes.  Physical Exam  BP 118/70  Pulse 65   Temp (!) 97.5 F (36.4 C)   Resp 16   Ht 6' (1.829 m)   Wt 237 lb (107.5 kg)   SpO2 98%   BMI 32.14 kg/m   Appears  well nourished, well groomed  and in no distress.  Eyes: PERRLA, EOMs, conjunctiva no swelling or erythema. Sinuses: No frontal/maxillary tenderness ENT/Mouth: EAC's clear, TM's nl w/o erythema, bulging. Nares clear w/o erythema, swelling,  exudates. Oropharynx clear without erythema or exudates. Oral hygiene is good. Tongue normal, non obstructing. Hearing intact.  Neck: Supple. Thyroid not palpable. Car 2+/2+ without bruits, nodes or JVD. Chest: Respirations nl with BS clear & equal w/o rales, rhonchi, wheezing or stridor.  Cor: Heart sounds normal w/ regular rate and rhythm without sig. murmurs, gallops, clicks or rubs. Peripheral pulses normal and equal  without edema.  Abdomen: Soft & bowel sounds normal. Non-tender w/o guarding, rebound, hernias, masses or organomegaly.  Lymphatics: Unremarkable.  Musculoskeletal: Full ROM all peripheral extremities, joint stability, 5/5 strength and normal gait.  Skin: Warm, dry without exposed rashes, lesions or ecchymosis apparent.  Neuro: Cranial nerves intact, reflexes equal bilaterally. Sensory-motor testing grossly intact. Tendon  reflexes grossly intact.  Pysch: Alert & oriented x 3.  Insight and judgement nl & appropriate. No ideations.  Assessment and Plan:  1. Essential hypertension  - Continue medication, monitor blood pressure at home.  - Continue DASH diet.  Reminder to go to the ER if any CP,  SOB, nausea, dizziness, severe HA, changes vision/speech.  - CBC with Differential/Platelet - COMPLETE METABOLIC PANEL WITH GFR - Magnesium - TSH  2. Hyperlipidemia associated with type 2 diabetes mellitus ([place])  - Continue diet/meds, exercise,& lifestyle modifications.  - Continue monitor periodic cholesterol/liver & renal functions   - Lipid panel - TSH  3. Type 2 diabetes mellitus with stage 1 chronic kidney  disease, without long-term current use of insulin (HCC)  - Continue diet, exercise  - Lifestyle modifications.  - Monitor appropriate labs - Hemoglobin A1c - Insulin, random  4. Vitamin D deficiency  - Continue supplementation.    - VITAMIN D 25 Hydroxy   5. OSA on CPAP  6. Gastroesophageal reflux disease  - CBC with Differential/Platelet  7. Medication management  - CBC with Differential/Platelet - COMPLETE METABOLIC PANEL WITH GFR - Magnesium - Lipid panel - TSH - Hemoglobin A1c - Insulin, random - VITAMIN D 25 Hydroxy         Discussed  regular exercise, BP monitoring, weight control to achieve/maintain BMI less than 25 and discussed med and SE's. Recommended labs to assess and monitor clinical status with further disposition pending results of labs.  I discussed the assessment and treatment plan with the patient. The patient was provided an opportunity to ask questions and all were answered. The patient agreed with the plan and demonstrated an understanding of the instructions.  I provided over 30 minutes of exam, counseling, chart review and  complex critical decision making.         The patient was advised to call back or seek an in-person evaluation if the symptoms  worsen or if the condition fails to improve as anticipated.   Kirtland Bouchard, MD

## 2020-12-16 LAB — LIPID PANEL
Cholesterol: 257 mg/dL — ABNORMAL HIGH (ref <200)
HDL: 36 mg/dL — ABNORMAL LOW (ref >=40)
LDL Cholesterol (Calc): 158 mg/dL (calc) — ABNORMAL HIGH
Non-HDL Cholesterol (Calc): 221 mg/dL (calc) — ABNORMAL HIGH (ref <130)
Total CHOL/HDL Ratio: 7.1 (calc) — ABNORMAL HIGH (ref <5.0)
Triglycerides: 373 mg/dL — ABNORMAL HIGH (ref <150)

## 2020-12-16 LAB — CBC WITH DIFFERENTIAL/PLATELET
Absolute Monocytes: 340 cells/uL (ref 200–950)
Basophils Absolute: 32 cells/uL (ref 0–200)
Basophils Relative: 0.5 %
Eosinophils Absolute: 208 cells/uL (ref 15–500)
Eosinophils Relative: 3.3 %
HCT: 41.7 % (ref 38.5–50.0)
Hemoglobin: 14.2 g/dL (ref 13.2–17.1)
Lymphs Abs: 3030 cells/uL (ref 850–3900)
MCH: 29.2 pg (ref 27.0–33.0)
MCHC: 34.1 g/dL (ref 32.0–36.0)
MCV: 85.8 fL (ref 80.0–100.0)
MPV: 10.1 fL (ref 7.5–12.5)
Monocytes Relative: 5.4 %
Neutro Abs: 2690 cells/uL (ref 1500–7800)
Neutrophils Relative %: 42.7 %
Platelets: 209 10*3/uL (ref 140–400)
RBC: 4.86 10*6/uL (ref 4.20–5.80)
RDW: 13 % (ref 11.0–15.0)
Total Lymphocyte: 48.1 %
WBC: 6.3 10*3/uL (ref 3.8–10.8)

## 2020-12-16 LAB — COMPLETE METABOLIC PANEL WITH GFR
AG Ratio: 1.6 (calc) (ref 1.0–2.5)
ALT: 37 U/L (ref 9–46)
AST: 23 U/L (ref 10–35)
Albumin: 4.5 g/dL (ref 3.6–5.1)
Alkaline phosphatase (APISO): 42 U/L (ref 35–144)
BUN: 19 mg/dL (ref 7–25)
CO2: 25 mmol/L (ref 20–32)
Calcium: 9.4 mg/dL (ref 8.6–10.3)
Chloride: 102 mmol/L (ref 98–110)
Creat: 0.88 mg/dL (ref 0.70–1.25)
GFR, Est African American: 104 mL/min/{1.73_m2} (ref >=60)
GFR, Est Non African American: 90 mL/min/{1.73_m2} (ref >=60)
Globulin: 2.8 g/dL (calc) (ref 1.9–3.7)
Glucose, Bld: 174 mg/dL — ABNORMAL HIGH (ref 65–99)
Potassium: 4.3 mmol/L (ref 3.5–5.3)
Sodium: 137 mmol/L (ref 135–146)
Total Bilirubin: 1 mg/dL (ref 0.2–1.2)
Total Protein: 7.3 g/dL (ref 6.1–8.1)

## 2020-12-16 LAB — VITAMIN D 25 HYDROXY (VIT D DEFICIENCY, FRACTURES): Vit D, 25-Hydroxy: 30 ng/mL (ref 30–100)

## 2020-12-16 LAB — HEMOGLOBIN A1C
Hgb A1c MFr Bld: 7.6 % of total Hgb — ABNORMAL HIGH (ref <5.7)
Mean Plasma Glucose: 171 mg/dL
eAG (mmol/L): 9.5 mmol/L

## 2020-12-16 LAB — TSH: TSH: 2.61 mIU/L (ref 0.40–4.50)

## 2020-12-16 LAB — INSULIN, RANDOM: Insulin: 13.4 u[IU]/mL

## 2020-12-16 LAB — MAGNESIUM: Magnesium: 1.8 mg/dL (ref 1.5–2.5)

## 2020-12-17 ENCOUNTER — Encounter: Payer: Self-pay | Admitting: Internal Medicine

## 2020-12-17 ENCOUNTER — Other Ambulatory Visit: Payer: Self-pay | Admitting: Internal Medicine

## 2020-12-17 MED ORDER — ROSUVASTATIN CALCIUM 20 MG PO TABS
20.0000 mg | ORAL_TABLET | Freq: Every day | ORAL | 3 refills | Status: DC
  Filled 2020-12-17: qty 90, 90d supply, fill #0

## 2020-12-17 NOTE — Progress Notes (Signed)
============================================================ - Test results slightly outside the reference range are not unusual. If there is anything important, I will review this with you,  otherwise it is considered normal test values.  If you have further questions,  please do not hesitate to contact me at the office or via My Chart.  ============================================================ ============================================================  -  Random Glucose at OV was 174 mg%   and      A1c = 7.6% - Still too high                                                                     (  Ideal or Goal is less than 5.7%  )    - Suggest restart the regular Metformin  2 tablets /day and Recommend                                  take 2 tablets of Imodium (Loperamide) with the Metformin    ( Can get the Anti-Diarrheal (loperamide ) a lot cheaper at                                                                                   [email or web address]  ============================================================ ============================================================  -  Total Chol = 257 - Terrible           (Ideal or Goal is less than 180  )  - and   - Bad / Dangerous LDL Chol = 158 - also terrible   - Re-sent in New Rx to increase your Rosuvastatin to the                                                                                       [place] recommend that you re-start the Zetia  (Ezetimibe)   - With these high Cholesterol readings, you are very high risk for                                             Heart Attack, Stroke and also developing                                            early age onset Alzheimer type Vascular Dementia   - Diet is STILL Also very Important   - Recommend  a much stricter low cholesterol diet   - Cholesterol only comes from animal sources  - ie. meat, dairy, egg yolks  - Eat all the vegetables you  want.  - Avoid meat, especially red meat - Beef AND Pork .  - Avoid cheese & dairy - milk & ice cream.     - Cheese is the most concentrated form of trans-fats which  is the worst thing to clog up our arteries.   - Veggie cheese is OK which can be found in the fresh  produce section at [employer] or [employer] or [employer] ============================================================ ============================================================  -  ALSO Triglycerides (   373    ) or fats in blood are way  too high !                                    (goal is less than 150)    - Recommend avoid fried & greasy foods,  sweets / candy,   - Avoid white rice  (brown or wild rice or Quinoa is OK),   - Avoid white potatoes  (sweet potatoes are OK)   - Avoid anything made from white flour  - bagels, doughnuts, rolls, buns, biscuits, white and   wheat breads, pizza crust and traditional  pasta made of white flour & egg white  - (vegetarian pasta or spinach or wheat pasta is OK).    - Multi-grain bread is OK - like multi-grain flat bread or  sandwich thins.   - Avoid alcohol in excess.   - Exercise is also important. ============================================================ ============================================================  -  Vitamin D =   30 - is also Extremely too Low  !     - Vitamin D goal is between 70-100.     - Please Re-Start  Vitamin D & Recommend take 10,000 units /Day        ( ie, Take 2 capsules of 5,000 units = 10,000 units /day)   - It is very important as a natural anti-inflammatory and helping the  immune system protect against viral infections, like the Covid-19    helping hair, skin, and nails, as well as reducing stroke and  heart attack risk.   - It helps your bones and helps with mood.  - It also decreases numerous cancer risks so please  take it as directed.   - Low Vit D is associated with a 200-300% higher risk for   CANCER   and 200-300% higher risk for HEART   ATTACK  &  STROKE.    - It is also associated with higher death rate at younger ages,   autoimmune diseases like Rheumatoid arthritis, Lupus,  Multiple Sclerosis.     - Also many other serious conditions, like depression, Alzheimer's  Dementia, infertility, muscle aches, fatigue, fibromyalgia   - just to name a few. ============================================================ ============================================================  -  All Else - CBC - Kidneys - Electrolytes - Liver - Magnesium & Thyroid    - all  Normal / OK ============================================================ [id number] ============================================================  -  If you feel that you are unable to take Rosuvastatin, then you can  be referred to the [facility] to see if you qualify for the                      shot that lowers cholesterol (costs about $500 /month & hopefully  your insurance company will pay)  ============================================================ [id number] ============================================================  -

## 2020-12-19 ENCOUNTER — Other Ambulatory Visit (HOSPITAL_COMMUNITY): Payer: Self-pay

## 2020-12-27 ENCOUNTER — Other Ambulatory Visit (HOSPITAL_COMMUNITY): Payer: Self-pay

## 2020-12-28 ENCOUNTER — Other Ambulatory Visit (HOSPITAL_COMMUNITY): Payer: Self-pay

## 2020-12-28 ENCOUNTER — Other Ambulatory Visit: Payer: Self-pay | Admitting: Physician Assistant

## 2020-12-28 ENCOUNTER — Encounter: Payer: Self-pay | Admitting: Orthopaedic Surgery

## 2020-12-28 MED ORDER — DICLOFENAC SODIUM 75 MG PO TBEC
75.0000 mg | DELAYED_RELEASE_TABLET | Freq: Two times a day (BID) | ORAL | 2 refills | Status: DC | PRN
  Filled 2020-12-28: qty 60, 30d supply, fill #0
  Filled 2021-01-24: qty 60, 30d supply, fill #1
  Filled 2021-03-02: qty 60, 30d supply, fill #2

## 2020-12-28 NOTE — Telephone Encounter (Signed)
Hello there. Rx has been sent to your pharmacy. Let us know if you need anything else.  Kathlee Nations

## 2020-12-28 NOTE — Telephone Encounter (Signed)
Sent in

## 2020-12-28 NOTE — Telephone Encounter (Signed)
Can you send in Rx for Voltaren tabs

## 2021-01-04 ENCOUNTER — Other Ambulatory Visit: Payer: Self-pay | Admitting: Physician Assistant

## 2021-01-04 ENCOUNTER — Other Ambulatory Visit (HOSPITAL_COMMUNITY): Payer: Self-pay

## 2021-01-04 DIAGNOSIS — N181 Chronic kidney disease, stage 1: Secondary | ICD-10-CM

## 2021-01-04 DIAGNOSIS — E1122 Type 2 diabetes mellitus with diabetic chronic kidney disease: Secondary | ICD-10-CM

## 2021-01-04 MED ORDER — FREESTYLE LITE TEST VI STRP
ORAL_STRIP | 3 refills | Status: AC
  Filled 2021-01-04: qty 150, 75d supply, fill #0
  Filled 2021-06-21: qty 150, 75d supply, fill #1
  Filled 2021-12-14: qty 150, 75d supply, fill #2

## 2021-01-05 ENCOUNTER — Other Ambulatory Visit (HOSPITAL_COMMUNITY): Payer: Self-pay

## 2021-01-16 ENCOUNTER — Other Ambulatory Visit (HOSPITAL_COMMUNITY): Payer: Self-pay

## 2021-01-17 ENCOUNTER — Other Ambulatory Visit (HOSPITAL_COMMUNITY): Payer: Self-pay

## 2021-01-17 DIAGNOSIS — E0822 Diabetes mellitus due to underlying condition with diabetic chronic kidney disease: Secondary | ICD-10-CM

## 2021-01-17 DIAGNOSIS — N182 Chronic kidney disease, stage 2 (mild): Secondary | ICD-10-CM

## 2021-01-17 DIAGNOSIS — N181 Chronic kidney disease, stage 1: Secondary | ICD-10-CM

## 2021-01-17 DIAGNOSIS — E1122 Type 2 diabetes mellitus with diabetic chronic kidney disease: Secondary | ICD-10-CM

## 2021-01-17 MED ORDER — OZEMPIC (0.25 OR 0.5 MG/DOSE) 2 MG/1.5ML ~~LOC~~ SOPN
0.5000 mg | PEN_INJECTOR | SUBCUTANEOUS | 0 refills | Status: DC
  Filled 2021-01-17: qty 1.5, 28d supply, fill #0

## 2021-01-24 ENCOUNTER — Other Ambulatory Visit: Payer: Self-pay

## 2021-01-24 MED FILL — Losartan Potassium Tab 50 MG: ORAL | 90 days supply | Qty: 90 | Fill #1 | Status: AC

## 2021-01-25 ENCOUNTER — Other Ambulatory Visit (HOSPITAL_COMMUNITY): Payer: Self-pay

## 2021-01-30 ENCOUNTER — Other Ambulatory Visit (HOSPITAL_COMMUNITY): Payer: Self-pay

## 2021-01-31 ENCOUNTER — Other Ambulatory Visit (HOSPITAL_COMMUNITY): Payer: Self-pay

## 2021-02-01 ENCOUNTER — Other Ambulatory Visit: Payer: Self-pay | Admitting: Adult Health Nurse Practitioner

## 2021-02-01 ENCOUNTER — Other Ambulatory Visit (HOSPITAL_COMMUNITY): Payer: Self-pay

## 2021-02-01 MED ORDER — GABAPENTIN 300 MG PO CAPS
900.0000 mg | ORAL_CAPSULE | Freq: Every evening | ORAL | 1 refills | Status: DC
  Filled 2021-02-01: qty 270, 90d supply, fill #0
  Filled 2021-06-21: qty 270, 90d supply, fill #1

## 2021-02-20 ENCOUNTER — Other Ambulatory Visit (HOSPITAL_COMMUNITY): Payer: Self-pay

## 2021-02-20 ENCOUNTER — Other Ambulatory Visit: Payer: Self-pay | Admitting: Physician Assistant

## 2021-02-20 DIAGNOSIS — N181 Chronic kidney disease, stage 1: Secondary | ICD-10-CM

## 2021-02-20 MED ORDER — FREESTYLE LANCETS MISC
0 refills | Status: AC
  Filled 2021-02-20: qty 100, 90d supply, fill #0

## 2021-03-02 ENCOUNTER — Other Ambulatory Visit: Payer: Self-pay | Admitting: Nurse Practitioner

## 2021-03-02 DIAGNOSIS — N181 Chronic kidney disease, stage 1: Secondary | ICD-10-CM

## 2021-03-02 DIAGNOSIS — E1122 Type 2 diabetes mellitus with diabetic chronic kidney disease: Secondary | ICD-10-CM

## 2021-03-02 MED FILL — Omeprazole Cap Delayed Release 20 MG: ORAL | 90 days supply | Qty: 90 | Fill #1 | Status: AC

## 2021-03-02 MED FILL — Citalopram Hydrobromide Tab 40 MG (Base Equiv): ORAL | 90 days supply | Qty: 90 | Fill #1 | Status: AC

## 2021-03-03 ENCOUNTER — Other Ambulatory Visit (HOSPITAL_COMMUNITY): Payer: Self-pay

## 2021-03-03 MED ORDER — OZEMPIC (0.25 OR 0.5 MG/DOSE) 2 MG/1.5ML ~~LOC~~ SOPN
0.5000 mg | PEN_INJECTOR | SUBCUTANEOUS | 0 refills | Status: DC
  Filled 2021-03-03: qty 1.5, 28d supply, fill #0

## 2021-03-06 ENCOUNTER — Emergency Department (HOSPITAL_BASED_OUTPATIENT_CLINIC_OR_DEPARTMENT_OTHER): Payer: 59

## 2021-03-06 ENCOUNTER — Other Ambulatory Visit: Payer: Self-pay

## 2021-03-06 ENCOUNTER — Encounter (HOSPITAL_BASED_OUTPATIENT_CLINIC_OR_DEPARTMENT_OTHER): Payer: Self-pay | Admitting: Emergency Medicine

## 2021-03-06 ENCOUNTER — Emergency Department (HOSPITAL_BASED_OUTPATIENT_CLINIC_OR_DEPARTMENT_OTHER)
Admission: EM | Admit: 2021-03-06 | Discharge: 2021-03-06 | Disposition: A | Discharge status: Home / Self Care | Payer: 59 | Attending: Emergency Medicine | Admitting: Emergency Medicine

## 2021-03-06 DIAGNOSIS — E785 Hyperlipidemia, unspecified: Secondary | ICD-10-CM | POA: Insufficient documentation

## 2021-03-06 DIAGNOSIS — I6523 Occlusion and stenosis of bilateral carotid arteries: Secondary | ICD-10-CM | POA: Diagnosis not present

## 2021-03-06 DIAGNOSIS — Z87891 Personal history of nicotine dependence: Secondary | ICD-10-CM | POA: Insufficient documentation

## 2021-03-06 DIAGNOSIS — R112 Nausea with vomiting, unspecified: Secondary | ICD-10-CM | POA: Diagnosis not present

## 2021-03-06 DIAGNOSIS — Z85828 Personal history of other malignant neoplasm of skin: Secondary | ICD-10-CM | POA: Diagnosis not present

## 2021-03-06 DIAGNOSIS — Z7982 Long term (current) use of aspirin: Secondary | ICD-10-CM | POA: Diagnosis not present

## 2021-03-06 DIAGNOSIS — R519 Headache, unspecified: Secondary | ICD-10-CM | POA: Diagnosis not present

## 2021-03-06 DIAGNOSIS — Z20822 Contact with and (suspected) exposure to covid-19: Secondary | ICD-10-CM | POA: Insufficient documentation

## 2021-03-06 DIAGNOSIS — R911 Solitary pulmonary nodule: Secondary | ICD-10-CM | POA: Insufficient documentation

## 2021-03-06 DIAGNOSIS — I10 Essential (primary) hypertension: Secondary | ICD-10-CM | POA: Insufficient documentation

## 2021-03-06 DIAGNOSIS — R0989 Other specified symptoms and signs involving the circulatory and respiratory systems: Secondary | ICD-10-CM | POA: Diagnosis not present

## 2021-03-06 DIAGNOSIS — Z79899 Other long term (current) drug therapy: Secondary | ICD-10-CM | POA: Diagnosis not present

## 2021-03-06 DIAGNOSIS — Z7984 Long term (current) use of oral hypoglycemic drugs: Secondary | ICD-10-CM | POA: Insufficient documentation

## 2021-03-06 DIAGNOSIS — E1169 Type 2 diabetes mellitus with other specified complication: Secondary | ICD-10-CM | POA: Diagnosis not present

## 2021-03-06 LAB — COMPREHENSIVE METABOLIC PANEL
ALT: 34 U/L (ref 0–44)
AST: 19 U/L (ref 15–41)
Albumin: 4.2 g/dL (ref 3.5–5.0)
Alkaline Phosphatase: 40 U/L (ref 38–126)
Anion gap: 11 (ref 5–15)
BUN: 15 mg/dL (ref 8–23)
CO2: 25 mmol/L (ref 22–32)
Calcium: 9.2 mg/dL (ref 8.9–10.3)
Chloride: 97 mmol/L — ABNORMAL LOW (ref 98–111)
Creatinine, Ser: 0.79 mg/dL (ref 0.61–1.24)
GFR, Estimated: >60 mL/min (ref >60)
Glucose, Bld: 193 mg/dL — ABNORMAL HIGH (ref 70–99)
Potassium: 3.7 mmol/L (ref 3.5–5.1)
Sodium: 133 mmol/L — ABNORMAL LOW (ref 135–145)
Total Bilirubin: 1.4 mg/dL — ABNORMAL HIGH (ref 0.3–1.2)
Total Protein: 7.5 g/dL (ref 6.5–8.1)

## 2021-03-06 LAB — URINALYSIS, ROUTINE W REFLEX MICROSCOPIC
Bilirubin Urine: NEGATIVE
Glucose, UA: 250 mg/dL — AB
Hgb urine dipstick: NEGATIVE
Ketones, ur: 40 mg/dL — AB
Leukocytes,Ua: NEGATIVE
Nitrite: NEGATIVE
Protein, ur: 30 mg/dL — AB
Specific Gravity, Urine: 1.025 (ref 1.005–1.030)
pH: 7 (ref 5.0–8.0)

## 2021-03-06 LAB — CBC WITH DIFFERENTIAL/PLATELET
Abs Immature Granulocytes: 0.03 10*3/uL (ref 0.00–0.07)
Basophils Absolute: 0 10*3/uL (ref 0.0–0.1)
Basophils Relative: 0 %
Eosinophils Absolute: 0 10*3/uL (ref 0.0–0.5)
Eosinophils Relative: 0 %
HCT: 38.3 % — ABNORMAL LOW (ref 39.0–52.0)
Hemoglobin: 13.3 g/dL (ref 13.0–17.0)
Immature Granulocytes: 0 %
Lymphocytes Relative: 21 %
Lymphs Abs: 1.9 10*3/uL (ref 0.7–4.0)
MCH: 29.1 pg (ref 26.0–34.0)
MCHC: 34.7 g/dL (ref 30.0–36.0)
MCV: 83.8 fL (ref 80.0–100.0)
Monocytes Absolute: 0.6 10*3/uL (ref 0.1–1.0)
Monocytes Relative: 6 %
Neutro Abs: 6.7 10*3/uL (ref 1.7–7.7)
Neutrophils Relative %: 73 %
Platelets: 194 10*3/uL (ref 150–400)
RBC: 4.57 MIL/uL (ref 4.22–5.81)
RDW: 12.3 % (ref 11.5–15.5)
WBC: 9.3 10*3/uL (ref 4.0–10.5)
nRBC: 0 % (ref 0.0–0.2)

## 2021-03-06 LAB — RESP PANEL BY RT-PCR (FLU A&B, COVID) ARPGX2
Influenza A by PCR: NEGATIVE
Influenza B by PCR: NEGATIVE
SARS Coronavirus 2 by RT PCR: NEGATIVE

## 2021-03-06 MED ORDER — PROCHLORPERAZINE EDISYLATE 10 MG/2ML IJ SOLN
10.0000 mg | Freq: Once | INTRAMUSCULAR | Status: AC
  Administered 2021-03-06: 10 mg via INTRAVENOUS
  Filled 2021-03-06: qty 2

## 2021-03-06 MED ORDER — IOHEXOL 350 MG/ML SOLN
75.0000 mL | Freq: Once | INTRAVENOUS | Status: AC | PRN
  Administered 2021-03-06: 75 mL via INTRAVENOUS

## 2021-03-06 MED ORDER — DIPHENHYDRAMINE HCL 50 MG/ML IJ SOLN
12.5000 mg | Freq: Once | INTRAMUSCULAR | Status: AC
  Administered 2021-03-06: 12.5 mg via INTRAVENOUS
  Filled 2021-03-06: qty 1

## 2021-03-06 NOTE — ED Notes (Signed)
Crackers and soda given.

## 2021-03-06 NOTE — ED Triage Notes (Addendum)
Pt arrives to ED with c/o of headache x4 days. The headache is frontal and described as throbbing. Pt denies vision disturbances or dizziness. However reports occasional fevers. Pt reports dry heaves and nausea. No CP, SOB, Abd pain.

## 2021-03-06 NOTE — Discharge Instructions (Addendum)
Please contact Dr. Clarice Pole office to request a follow-up appointment to discuss the CT scan report and discuss further testing.  He will likely need an MRI.  Additionally recommend follow-up with your primary doctor.  You should have a repeat chest x-ray in 3 to 6 months to ensure stability of the small pulmonary nodule.  Please return to the emergency room for reassessment if you develop recurrent fever, any neck stiffness, worsening headache, vomiting, lethargy or other new concerning symptom.

## 2021-03-07 ENCOUNTER — Other Ambulatory Visit (HOSPITAL_COMMUNITY): Payer: Self-pay

## 2021-03-07 NOTE — ED Provider Notes (Signed)
[facility] EMERGENCY DEPT Provider Note   CSN: [id number] Arrival date & time: 03/06/21  8841     History Chief Complaint  Patient presents with   Headache    Glenn Stephens is a 65 y.o. male.  Presents to ER with concern for headache.  Patient reports headache ongoing for the past 3 to 4 days.  Was not sudden onset but has been fairly bothersome.  Moderate to severe in severity.  Frontal, throbbing.  No neck pain or stiffness.  Does not normally get headaches or migraines.  Had some associated nausea with occasional dry heaves.  No nausea at present.  Had low-grade temperature at home.  No abdominal pain.  No numbness, weakness, speech or vision change.  Not on blood thinners.  HPI     Past Medical History:  Diagnosis Date   Acute calculous cholecystitis s/p lap cholecystectopmy 05/23/2018 05/22/2018   Cancer ([place])    skin cancer - basal cell   Diabetes mellitus without complication ([place])    Full dentures    GERD (gastroesophageal reflux disease)    Heart murmur    no problems per pt   Hernia, abdominal    Hyperlipidemia    Hypertension    OSA (obstructive sleep apnea) 08/03/2013   pt does not use CPAP   Pneumonia    as a small child only   Superficial thrombophlebitis of left upper extremity 06/18/2018   Unspecified vitamin D deficiency    Wears glasses     Patient Active Problem List   Diagnosis Date Noted   Morbid obesity ([place]) 01/22/2020   Lesion of pancreas 06/18/2018   History of colonic polyps 06/18/2018   Type 2 diabetes mellitus ([place]) 01/13/2018   History of posttraumatic stress disorder (PTSD) 06/25/2016   RLS (restless legs syndrome) 06/25/2016   Abnormal dreams 04/17/2016   PLMD (periodic limb movement disorder) 04/17/2016   Diabetic peripheral neuropathy associated with type 2 diabetes mellitus ([place]) 03/27/2016   Generalized anxiety disorder 03/24/2015   History of SCC (squamous cell carcinoma) of skin 12/28/2014   OSA on CPAP  08/03/2013   Vitamin D deficiency    Hypertension    Hyperlipidemia    GERD (gastroesophageal reflux disease)    Varicose veins of both lower extremities 02/17/2013    Past Surgical History:  Procedure Laterality Date   BIOPSY  07/23/2018   Procedure: BIOPSY;  Surgeon: Irving Copas., MD;  Location: [facility];  Service: Gastroenterology;;   BIOPSY  08/13/2018   Procedure: BIOPSY;  Surgeon: Irving Copas., MD;  Location: [facility];  Service: Gastroenterology;;   CHOLECYSTECTOMY N/A 05/23/2018   Procedure: LAPAROSCOPIC CHOLECYSTECTOMY;  Surgeon: Clovis Riley, MD;  Location: [facility];  Service: General;  Laterality: N/A;   ENDOSCOPIC RETROGRADE CHOLANGIOPANCREATOGRAPHY (ERCP) WITH PROPOFOL N/A 08/13/2018   Procedure: ENDOSCOPIC RETROGRADE CHOLANGIOPANCREATOGRAPHY (ERCP) WITH PROPOFOL;  Surgeon: Irving Copas., MD;  Location: [facility] ENDOSCOPY;  Service: Gastroenterology;  Laterality: N/A;   ESOPHAGOGASTRODUODENOSCOPY (EGD) WITH PROPOFOL N/A 07/23/2018   Procedure: ESOPHAGOGASTRODUODENOSCOPY (EGD) WITH PROPOFOL;  Surgeon: Rush Landmark Telford Nab., MD;  Location: [facility];  Service: Gastroenterology;  Laterality: N/A;   LASER ABLATION     veins   METATARSAL OSTEOTOMY WITH BUNIONECTOMY     MULTIPLE TOOTH EXTRACTIONS     orthopedic surgeries     knee,foot   REMOVAL OF STONES  08/13/2018   Procedure: REMOVAL OF STONES;  Surgeon: Rush Landmark Telford Nab., MD;  Location: [facility];  Service: Gastroenterology;;   Joan Mayans  08/13/2018   Procedure: SPHINCTEROTOMY;  Surgeon: Mansouraty, Telford Nab., MD;  Location: [facility];  Service: Gastroenterology;;   UPPER ESOPHAGEAL ENDOSCOPIC ULTRASOUND (EUS) N/A 07/23/2018   Procedure: UPPER ESOPHAGEAL ENDOSCOPIC ULTRASOUND (EUS);  Surgeon: Irving Copas., MD;  Location: [facility] [facility];  Service: Gastroenterology;  Laterality: N/A;   VASECTOMY         Family History  Problem Relation Age of Onset   Cancer  Mother        melanoma with mets .  multiple endocrine neoplasia, MEN 1, in sister as well   Heart disease Father    Hyperlipidemia Father    Diabetes Sister        prothrombin gene mutations, hypercoagulable disorder in sister and her kids.     Hypertension Brother    Stomach cancer Maternal Grandfather    Colon cancer Neg Hx    Colon polyps Neg Hx    Esophageal cancer Neg Hx    Rectal cancer Neg Hx    Inflammatory bowel disease Neg Hx    Liver disease Neg Hx    Pancreatic cancer Neg Hx     Social History   Tobacco Use   Smoking status: Former   Smokeless tobacco: Former    Types: Nurse, children's Use: Never used  Substance Use Topics   Alcohol use: No   Drug use: No    Home Medications Prior to Admission medications   Medication Sig Start Date End Date Taking? Authorizing Provider  aspirin 81 MG tablet Take 81 mg by mouth daily.    [provider]  blood glucose meter kit and supplies Test sugars once dailyDispense based insurance preference. E11.9 04/17/18   Vladimir Crofts, PA-C  Cholecalciferol 125 MCG (5000 UT) capsule Take 5,000 Units by mouth daily. Patient not taking: Reported on 12/15/2020    [provider]  citalopram (CELEXA) 40 MG tablet TAKE 1 TABLET (40 MG TOTAL) BY MOUTH DAILY. 09/15/20 09/15/21  Garnet Sierras, NP  clonazePAM (KLONOPIN) 1 MG tablet TAKE 1 TABLET BY MOUTH NIGHTLY FOR RESTLESS LEGS 12/06/20 06/04/21  Liane Comber, NP  Continuous Blood Gluc Sensor (FREESTYLE LIBRE 14 DAY SENSOR) MISC USE AS DIRECTED TO CHECK BLOOD SUGAR THREE TIMES DAILY Patient not taking: Reported on 12/15/2020 05/09/20 05/09/21  Liane Comber, NP  diclofenac (VOLTAREN) 75 MG EC tablet Take 1 tablet (75 mg total) by mouth 2 (two) times daily between meals as needed. 12/28/20   Aundra Dubin, PA-C  Empagliflozin-metFORMIN HCl ER (SYNJARDY XR) 25-1000 MG TB24 TAKE 1 TABLET BY MOUTH ONCE DAILY. 10/29/20   Liane Comber, NP  ezetimibe (ZETIA) 10 MG  tablet TAKE 1 TABLET BY MOUTH DAILY FOR CHOLESTEROL Patient not taking: Reported on 12/15/2020 08/17/20 08/17/21  Unk Pinto, MD  gabapentin (NEURONTIN) 300 MG capsule Take 3 capsules (900 mg total) by mouth every night for chronic pain or sleep 02/01/21   Magda Bernheim, NP  glucose blood (FREESTYLE LITE) test strip Check  Blood Sugar  2 times daily 01/04/21   Unk Pinto, MD  ibuprofen (ADVIL,MOTRIN) 200 MG tablet Take 600 mg by mouth every 6 (six) hours as needed for headache, mild pain, moderate pain or cramping.    [provider]  Lancets (FREESTYLE) lancets USE TO TEST BLOOD GLUCOSE ONCE A DAY 02/20/21   Unk Pinto, MD  losartan (COZAAR) 50 MG tablet TAKE 1 TABLET (50 MG TOTAL) BY MOUTH DAILY. 09/14/20   Unk Pinto, MD  Magnesium 100 MG  CAPS Take 100 mg by mouth daily.  Patient not taking: Reported on 12/15/2020    [provider]  metFORMIN (GLUCOPHAGE-XR) 500 MG 24 hr tablet TAKE 1 TABLET BY MOUTH 2 TIMES A DAY. Patient not taking: Reported on 12/15/2020 01/04/20 01/03/21  Vladimir Crofts, PA-C  omeprazole (PRILOSEC) 20 MG capsule TAKE 1 CAPSULE (20 MG TOTAL) BY MOUTH DAILY. 09/12/20 09/12/21  Garnet Sierras, NP  rOPINIRole (REQUIP) 1 MG tablet Take  1/2 to 1 tablet by mouth 1 hour before bedtime  for restless legs. 12/15/20   Unk Pinto, MD  rosuvastatin (CRESTOR) 20 MG tablet Take 1 tablet (20 mg total) by mouth daily for cholesterol. 12/17/20   Unk Pinto, MD  Semaglutide,0.25 or 0.5MG/DOS, (OZEMPIC, 0.25 OR 0.5 MG/DOSE,) 2 MG/1.5ML SOPN Inject 0.5 mg into the skin once a week. 03/03/21   Unk Pinto, MD  TURMERIC PO Take 1 capsule by mouth 2 (two) times daily.     [provider]    Allergies    Caduet [amlodipine-atorvastatin] and Statins  Review of Systems   Review of Systems  Constitutional:  Positive for chills. Negative for fever.  HENT:  Negative for ear pain and sore throat.   Eyes:  Negative for pain and visual disturbance.   Respiratory:  Negative for cough and shortness of breath.   Cardiovascular:  Negative for chest pain and palpitations.  Gastrointestinal:  Negative for abdominal pain and vomiting.  Genitourinary:  Negative for dysuria and hematuria.  Musculoskeletal:  Negative for arthralgias and back pain.  Skin:  Negative for color change and rash.  Neurological:  Positive for headaches. Negative for seizures and syncope.  All other systems reviewed and are negative.  Physical Exam Updated Vital Signs BP (!) 154/80   Pulse (!) 57   Temp 98.2 F (36.8 C) (Oral)   Resp 16   Ht 6' (1.829 m)   Wt 103 kg   SpO2 99%   BMI 30.79 kg/m   Physical Exam Vitals and nursing note reviewed.  Constitutional:      Appearance: He is well-developed.  HENT:     Head: Normocephalic and atraumatic.  Eyes:     Conjunctiva/sclera: Conjunctivae normal.  Neck:     Comments: Normal ear to shoulder bilaterally, normal chin to chest, no pain with range of motion Cardiovascular:     Rate and Rhythm: Normal rate and regular rhythm.     Heart sounds: No murmur heard. Pulmonary:     Effort: Pulmonary effort is normal. No respiratory distress.     Breath sounds: Normal breath sounds.  Abdominal:     Palpations: Abdomen is soft.     Tenderness: There is no abdominal tenderness.  Musculoskeletal:     Cervical back: Neck supple.  Skin:    General: Skin is warm and dry.  Neurological:     Mental Status: He is alert.     Comments: AAOx3 CN 2-12 intact, speech clear visual fields intact 5/5 strength in b/l UE and LE Sensation to light touch intact in b/l UE and LE Normal FNF Normal gait    ED Results / Procedures / Treatments   Labs (all labs ordered are listed, but only abnormal results are displayed) Labs Reviewed  CBC WITH DIFFERENTIAL/PLATELET - Abnormal; Notable for the following components:      Result Value   HCT 38.3 (*)    All other components within normal limits  COMPREHENSIVE METABOLIC PANEL -  Abnormal; Notable for the following components:  Sodium 133 (*)    Chloride 97 (*)    Glucose, Bld 193 (*)    Total Bilirubin 1.4 (*)    All other components within normal limits  URINALYSIS, ROUTINE W REFLEX MICROSCOPIC - Abnormal; Notable for the following components:   Glucose, UA 250 (*)    Ketones, ur 40 (*)    Protein, ur 30 (*)    Bacteria, UA FEW (*)    Non Squamous Epithelial 0-5 (*)    All other components within normal limits  RESP PANEL BY RT-PCR (FLU A&B, COVID) [id number]    EKG EKG Interpretation  Date/Time:  Monday March 06 2021 09:35:06 EDT Ventricular Rate:  65 PR Interval:  172 QRS Duration: 122 QT Interval:  436 QTC Calculation: 453 R Axis:   -68 Text Interpretation: Normal sinus rhythm Left anterior fascicular block Abnormal ECG Confirmed by Madalyn Rob [phone]) on 03/06/2021 1:29:21 PM  Radiology CT Angio Head W or Wo Contrast  Result Date: 03/06/2021 CLINICAL DATA:  Neuro deficit, acute, stroke suspected Stroke/TIA, assess intracranial arteries severe headache, neck pain, concern for SAH, aneurysm; Stroke/TIA, assess extracranial arteries severe headache, neck pain, concern for SAH, aneurysm EXAM: CT ANGIOGRAPHY HEAD AND NECK TECHNIQUE: Multidetector CT imaging of the head and neck was performed using the standard protocol during bolus administration of intravenous contrast. Multiplanar CT image reconstructions and MIPs were obtained to evaluate the vascular anatomy. Carotid stenosis measurements (when applicable) are obtained utilizing NASCET criteria, using the distal internal carotid diameter as the denominator. CONTRAST:  80m OMNIPAQUE IOHEXOL 350 MG/ML SOLN COMPARISON:  None. FINDINGS: CT HEAD FINDINGS Brain: No evidence of acute infarction, hemorrhage, hydrocephalus, or extra-axial fluid collection. Enlarged pituitary gland with upward convexity, measuring approximately 13 mm in craniocaudal dimension. There is some suprasellar extension. Mild scattered  white matter hypoattenuation, nonspecific but most likely related to chronic microvascular disease. Vascular: See below. Skull: No acute fracture. Sinuses: Visualized sinuses are clear. Orbits: No acute finding. Review of the MIP images confirms the above findings CTA NECK FINDINGS Aortic arch: Great vessel origins are patent. Right carotid system: Predominately calcific atherosclerosis at the carotid bifurcation without greater than 50% stenosis. Left carotid system: Predominately calcific atherosclerosis at the carotid bifurcation without greater than 50% stenosis. Vertebral arteries: Right dominant. Approximately 50% stenosis of the right vertebral artery origin. Poor visualization of the proximal left vertebral artery due to edema and streak hardening artifact. These left vertebral artery is small throughout its course. Skeleton: Moderate multilevel degenerative change with disc height loss, endplate sclerosis and posterior disc osteophyte complexes. Other neck: No acute abnormality. Upper chest: Visualized lung apices are clear. Review of the MIP images confirms the above findings CTA HEAD FINDINGS Limited is a due to venous contamination.  Within this limitation: Anterior circulation: Patent intracranial ICAs bilaterally with mild stenosis of the left ICA due to calcific atherosclerosis. Bilateral MCAs and ACAs are patent without proximal hemodynamically significant stenosis. No aneurysm identified. Posterior circulation: Likely severe stenosis of the small/non dominant left vertebral artery at its dural margin with diminutive left intradural vertebral artery. Right intradural vertebral artery is patent without significant stenosis. Moderate stenosis of the proximal basilar artery. Basilar remains patent. Bilateral posterior cerebral arteries are patent with limited evaluation distally due to venous contamination. No aneurysm identified. Venous sinuses: As permitted by contrast timing, patent. Review of the  MIP images confirms the above findings IMPRESSION: CT head: 1. Enlarged pituitary gland with upward convexity, concerning for pituitary mass. Recommend pituitary protocol MRI with contrast  and correlation with pituitary function labs. 2. Otherwise, no evidence of acute intracranial abnormality CTA head: Limited study due to venous contamination. 1. No large vessel occlusion. 2. Likely severe stenosis of the small/non dominant left vertebral artery at its dural margin. 3. Moderate proximal basilar artery stenosis. CTA neck: 1. Moderate stenosis of the dominant right vertebral artery origin. 2. Bilateral carotid bifurcation atherosclerosis without greater than 50% stenosis. 3. Poor visualization of the small/non dominant left vertebral artery proximally due to artifact. Electronically Signed   By: Margaretha Sheffield M.D.   On: 03/06/2021 11:53   CT Angio Neck W and/or Wo Contrast  Result Date: 03/06/2021 CLINICAL DATA:  Neuro deficit, acute, stroke suspected Stroke/TIA, assess intracranial arteries severe headache, neck pain, concern for SAH, aneurysm; Stroke/TIA, assess extracranial arteries severe headache, neck pain, concern for SAH, aneurysm EXAM: CT ANGIOGRAPHY HEAD AND NECK TECHNIQUE: Multidetector CT imaging of the head and neck was performed using the standard protocol during bolus administration of intravenous contrast. Multiplanar CT image reconstructions and MIPs were obtained to evaluate the vascular anatomy. Carotid stenosis measurements (when applicable) are obtained utilizing NASCET criteria, using the distal internal carotid diameter as the denominator. CONTRAST:  75m OMNIPAQUE IOHEXOL 350 MG/ML SOLN COMPARISON:  None. FINDINGS: CT HEAD FINDINGS Brain: No evidence of acute infarction, hemorrhage, hydrocephalus, or extra-axial fluid collection. Enlarged pituitary gland with upward convexity, measuring approximately 13 mm in craniocaudal dimension. There is some suprasellar extension. Mild scattered  white matter hypoattenuation, nonspecific but most likely related to chronic microvascular disease. Vascular: See below. Skull: No acute fracture. Sinuses: Visualized sinuses are clear. Orbits: No acute finding. Review of the MIP images confirms the above findings CTA NECK FINDINGS Aortic arch: Great vessel origins are patent. Right carotid system: Predominately calcific atherosclerosis at the carotid bifurcation without greater than 50% stenosis. Left carotid system: Predominately calcific atherosclerosis at the carotid bifurcation without greater than 50% stenosis. Vertebral arteries: Right dominant. Approximately 50% stenosis of the right vertebral artery origin. Poor visualization of the proximal left vertebral artery due to edema and streak hardening artifact. These left vertebral artery is small throughout its course. Skeleton: Moderate multilevel degenerative change with disc height loss, endplate sclerosis and posterior disc osteophyte complexes. Other neck: No acute abnormality. Upper chest: Visualized lung apices are clear. Review of the MIP images confirms the above findings CTA HEAD FINDINGS Limited is a due to venous contamination.  Within this limitation: Anterior circulation: Patent intracranial ICAs bilaterally with mild stenosis of the left ICA due to calcific atherosclerosis. Bilateral MCAs and ACAs are patent without proximal hemodynamically significant stenosis. No aneurysm identified. Posterior circulation: Likely severe stenosis of the small/non dominant left vertebral artery at its dural margin with diminutive left intradural vertebral artery. Right intradural vertebral artery is patent without significant stenosis. Moderate stenosis of the proximal basilar artery. Basilar remains patent. Bilateral posterior cerebral arteries are patent with limited evaluation distally due to venous contamination. No aneurysm identified. Venous sinuses: As permitted by contrast timing, patent. Review of the  MIP images confirms the above findings IMPRESSION: CT head: 1. Enlarged pituitary gland with upward convexity, concerning for pituitary mass. Recommend pituitary protocol MRI with contrast and correlation with pituitary function labs. 2. Otherwise, no evidence of acute intracranial abnormality CTA head: Limited study due to venous contamination. 1. No large vessel occlusion. 2. Likely severe stenosis of the small/non dominant left vertebral artery at its dural margin. 3. Moderate proximal basilar artery stenosis. CTA neck: 1. Moderate stenosis of the dominant right vertebral  artery origin. 2. Bilateral carotid bifurcation atherosclerosis without greater than 50% stenosis. 3. Poor visualization of the small/non dominant left vertebral artery proximally due to artifact. Electronically Signed   By: Margaretha Sheffield M.D.   On: 03/06/2021 11:53   DG Chest Portable 1 View  Result Date: 03/06/2021 CLINICAL DATA:  65 year old male with history of fever, nausea and congestion. EXAM: PORTABLE CHEST 1 VIEW COMPARISON:  Chest x-ray 03/18/2007. FINDINGS: Lung volumes are normal. No consolidative airspace disease. No pleural effusions. No pneumothorax. Tiny dense nodule projecting over the left mid lung in the region of the intersection between the anterior aspect of the left fifth rib and the posterior aspect of the left eighth rib, most likely either a small calcified granuloma or potential bone island. No other suspicious appearing pulmonary nodule or mass noted. Pulmonary vasculature and the cardiomediastinal silhouette are within normal limits. IMPRESSION: 1. No radiographic evidence of acute cardiopulmonary disease. 2. Small dense nodule projecting over the left mid lung, favored to be benign, as above. Repeat standing PA and lateral chest radiograph is recommended in 3-6 months to ensure the stability of this finding. Electronically Signed   By: Vinnie Langton M.D.   On: 03/06/2021 11:35    Procedures Procedures    Medications Ordered in ED Medications  iohexol (OMNIPAQUE) 350 MG/ML injection 75 mL (75 mLs Intravenous Contrast Given 03/06/21 1053)  prochlorperazine (COMPAZINE) injection 10 mg (10 mg Intravenous Given 03/06/21 1309)  diphenhydrAMINE (BENADRYL) injection 12.5 mg (12.5 mg Intravenous Given 03/06/21 1309)    ED Course  I have reviewed the triage vital signs and the nursing notes.  Pertinent labs & imaging results that were available during my care of the patient were reviewed by me and considered in my medical decision making (see chart for details).  Clinical Course as of 03/07/21 0848  Mon Mar 06, 2021  1316 D/w Ellene Route [RD]    Clinical Course User Index [RD] Lucrezia Starch, MD   MDM Rules/Calculators/A&P                           65 year old gentleman presenting to the emergency room with concern for bad headache.  On exam patient well-appearing in no distress.  Vital stable.  Given patient does not struggle with headache normally, checked basic labs, CT head and CTA to rule out subarachnoid, aneurysm or other acute intracranial pathology.  No neck stiffness, normal neck range of motion, had reported low-grade fevers at home but afebrile here.  Doubt CNS infection.  May be viral illness.  Also sent COVID testing.    CT head was concerning for enlarged pituitary gland.  Radiologist recommending pituitary protocol MRI for further assessment.  No aneurysm, no bleed, no stroke.  I discussed the CT images with Dr. Ellene Route on-call for neurosurgery.  He reviewed images.  States that the findings on CT regarding the pituitary gland are quite subtle and he feels this can be further worked up in the outpatient setting.  He recommends patient get an appointment in his clinic but does not have to be admitted or have the MRI necessarily completed today.  Reassessed patient, remained well-appearing.  Minimal ongoing symptoms after headache cocktail.  Given his current clinical appearance, believe  he can be managed in the outpatient setting.  Reviewed return precautions and discharged home.  On chest x-ray noted small lung nodule.  This was disclosed to patient as well and he was instructed to follow-up with his  primary doctor for repeat chest x-ray to ensure stability.    After the discussed management above, the patient was determined to be safe for discharge.  The patient was in agreement with this plan and all questions regarding their care were answered.  ED return precautions were discussed and the patient will return to the ED with any significant worsening of condition.  Final Clinical Impression(s) / ED Diagnoses Final diagnoses:  Lung nodule  Nonintractable headache, unspecified chronicity pattern, unspecified headache type    Rx / DC Orders ED Discharge Orders     None        Lucrezia Starch, MD 03/07/21 [phone]

## 2021-03-09 ENCOUNTER — Ambulatory Visit (HOSPITAL_COMMUNITY)
Admission: RE | Admit: 2021-03-09 | Discharge: 2021-03-09 | Disposition: A | Discharge status: Home / Self Care | Payer: 59 | Source: Ambulatory Visit | Attending: Neurological Surgery | Admitting: Neurological Surgery

## 2021-03-09 ENCOUNTER — Other Ambulatory Visit: Payer: Self-pay | Admitting: Neurological Surgery

## 2021-03-09 ENCOUNTER — Other Ambulatory Visit (HOSPITAL_COMMUNITY): Payer: Self-pay | Admitting: Neurological Surgery

## 2021-03-09 ENCOUNTER — Other Ambulatory Visit: Payer: Self-pay

## 2021-03-09 DIAGNOSIS — E236 Other disorders of pituitary gland: Secondary | ICD-10-CM | POA: Insufficient documentation

## 2021-03-09 DIAGNOSIS — R220 Localized swelling, mass and lump, head: Secondary | ICD-10-CM | POA: Diagnosis not present

## 2021-03-09 DIAGNOSIS — E237 Disorder of pituitary gland, unspecified: Secondary | ICD-10-CM | POA: Diagnosis not present

## 2021-03-09 MED ORDER — GADOBUTROL 1 MMOL/ML IV SOLN
10.0000 mL | Freq: Once | INTRAVENOUS | Status: AC | PRN
  Administered 2021-03-09: 10 mL via INTRAVENOUS

## 2021-03-15 DIAGNOSIS — E236 Other disorders of pituitary gland: Secondary | ICD-10-CM | POA: Diagnosis not present

## 2021-03-21 ENCOUNTER — Other Ambulatory Visit: Payer: Self-pay | Admitting: Internal Medicine

## 2021-03-21 DIAGNOSIS — E236 Other disorders of pituitary gland: Secondary | ICD-10-CM

## 2021-03-22 ENCOUNTER — Other Ambulatory Visit: Payer: Self-pay | Admitting: Internal Medicine

## 2021-03-22 DIAGNOSIS — E236 Other disorders of pituitary gland: Secondary | ICD-10-CM

## 2021-03-24 ENCOUNTER — Other Ambulatory Visit: Payer: Self-pay | Admitting: Adult Health

## 2021-03-24 ENCOUNTER — Other Ambulatory Visit (HOSPITAL_COMMUNITY): Payer: Self-pay

## 2021-03-24 DIAGNOSIS — R251 Tremor, unspecified: Secondary | ICD-10-CM

## 2021-03-24 MED ORDER — CLONAZEPAM 1 MG PO TABS
1.0000 mg | ORAL_TABLET | Freq: Every evening | ORAL | 0 refills | Status: DC
  Filled 2021-03-24: qty 90, 90d supply, fill #0

## 2021-03-31 ENCOUNTER — Other Ambulatory Visit: Payer: Self-pay | Admitting: Physician Assistant

## 2021-03-31 ENCOUNTER — Other Ambulatory Visit (HOSPITAL_COMMUNITY): Payer: Self-pay

## 2021-04-03 ENCOUNTER — Other Ambulatory Visit (HOSPITAL_COMMUNITY): Payer: Self-pay

## 2021-04-05 ENCOUNTER — Other Ambulatory Visit: Payer: Self-pay | Admitting: Physician Assistant

## 2021-04-05 ENCOUNTER — Other Ambulatory Visit (HOSPITAL_COMMUNITY): Payer: Self-pay

## 2021-04-07 ENCOUNTER — Other Ambulatory Visit: Payer: Self-pay | Admitting: Physician Assistant

## 2021-04-07 ENCOUNTER — Other Ambulatory Visit (HOSPITAL_COMMUNITY): Payer: Self-pay

## 2021-04-07 ENCOUNTER — Telehealth: Payer: Self-pay | Admitting: Orthopaedic Surgery

## 2021-04-07 MED ORDER — DICLOFENAC SODIUM 75 MG PO TBEC
75.0000 mg | DELAYED_RELEASE_TABLET | Freq: Two times a day (BID) | ORAL | 2 refills | Status: DC | PRN
  Filled 2021-04-07: qty 180, 90d supply, fill #0
  Filled 2021-04-07: qty 60, 30d supply, fill #0

## 2021-04-07 NOTE — Telephone Encounter (Signed)
Pt called stating he needs a refill of his voltaren 75 mg and when our office attempted to send it in last Wednesday it failed to go through. The pt would like to have this resent and would like a CB when that's been done please.   [phone]

## 2021-04-07 NOTE — Telephone Encounter (Signed)
Just sent to [facility]

## 2021-04-07 NOTE — Telephone Encounter (Signed)
Called patient no answer LMOM. Rx sent to pharm.

## 2021-04-10 ENCOUNTER — Other Ambulatory Visit (HOSPITAL_COMMUNITY): Payer: Self-pay

## 2021-04-10 ENCOUNTER — Other Ambulatory Visit: Payer: Self-pay | Admitting: Internal Medicine

## 2021-04-10 DIAGNOSIS — N181 Chronic kidney disease, stage 1: Secondary | ICD-10-CM

## 2021-04-10 MED ORDER — OZEMPIC (0.25 OR 0.5 MG/DOSE) 2 MG/1.5ML ~~LOC~~ SOPN
0.5000 mg | PEN_INJECTOR | SUBCUTANEOUS | 0 refills | Status: DC
Start: 2021-04-10 — End: 2021-05-06
  Filled 2021-04-10: qty 1.5, 28d supply, fill #0

## 2021-04-18 ENCOUNTER — Ambulatory Visit: Payer: 59 | Admitting: Internal Medicine

## 2021-04-18 ENCOUNTER — Encounter: Payer: Self-pay | Admitting: Internal Medicine

## 2021-04-18 ENCOUNTER — Other Ambulatory Visit: Payer: Self-pay

## 2021-04-18 VITALS — BP 120/74 | HR 74 | Ht 72.0 in | Wt 233.0 lb

## 2021-04-18 DIAGNOSIS — Z8341 Family history of multiple endocrine neoplasia [MEN] syndrome: Secondary | ICD-10-CM | POA: Diagnosis not present

## 2021-04-18 DIAGNOSIS — D352 Benign neoplasm of pituitary gland: Secondary | ICD-10-CM

## 2021-04-18 NOTE — Progress Notes (Signed)
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 male with a past medical history of T2DM, HTN and OSA on CPAP . 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   During evaluation for neurological deficit, he was noted to have a pituitary  adenoma 1.1 cm on brain imgaing    He is accompanied by his wife today   He has occasional headaches and fatigue as well blurry vision  Saw ophthalmology 05/2020 but not since   Saw neurosurgery - Dr. Ellene Route - monitoring    Denies galactorrhea  Weight stable - he is on Ozempic  which has helped him lose weight  NO severe HTN - controlled with meds  Had nausea and vomiting early on with headaches but this has resolved  Denies prior dx of pancreatitis  Denies renal stones  Denies erectile dysfunction  Has decreased libido , was on Testosterone injections but he did not like taking them so he stopped.  No herbal supplements  except  zinc, mg, cinnamon , turmeric   No person or FH of prostate cancer  No hx of CAD          Mother with MEN I     HISTORY:  Past Medical History:  Past Medical History:  Diagnosis Date   Acute calculous cholecystitis s/p lap cholecystectopmy 05/23/2018 05/22/2018   Cancer ([place])    skin cancer - basal cell   Diabetes mellitus without complication (HCC)    Full dentures    GERD (gastroesophageal reflux disease)    Heart murmur    no problems per pt   Hernia, abdominal    Hyperlipidemia    Hypertension    OSA (obstructive sleep apnea) 08/03/2013   pt does not use CPAP   Pneumonia    as a small child only   Superficial thrombophlebitis of left upper extremity 06/18/2018    Unspecified vitamin D deficiency    Wears glasses    Past Surgical History:  Past Surgical History:  Procedure Laterality Date   BIOPSY  07/23/2018   Procedure: BIOPSY;  Surgeon: Irving Copas., MD;  Location: [place];  Service: Gastroenterology;;   BIOPSY  08/13/2018   Procedure: BIOPSY;  Surgeon: Irving Copas., MD;  Location: [facility] END[facility];  Service: Gastroenterology;;   CHOLECYSTECTOMY N/A 05/23/2018   Procedure: LAPAROSCOPIC CHOLECYSTECTOMY;  Surgeon: Clovis Riley, MD;  Location: [place];  Service: General;  Laterality: N/A;   ENDOSCOPIC RETROGRADE CHOLANGIOPANCREATOGRAPHY (ERCP) WITH PROPOFOL N/A 08/13/2018   Procedure: ENDOSCOPIC RETROGRADE CHOLANGIOPANCREATOGRAPHY (ERCP) WITH PROPOFOL;  Surgeon: Irving Copas., MD;  Location: [place];  Service: Gastroenterology;  Laterality: N/A;   ESOPHAGOGASTRODUODENOSCOPY (EGD) WITH PROPOFOL N/A 07/23/2018   Procedure: ESOPHAGOGASTRODUODENOSCOPY (EGD) WITH PROPOFOL;  Surgeon: Rush Landmark Telford Nab., MD;  Location: [place];  Service: Gastroenterology;  Laterality: N/A;   LASER ABLATION     veins   METATARSAL OSTEOTOMY WITH BUNIONECTOMY     MULTIPLE TOOTH EXTRACTIONS     orthopedic surgeries     knee,foot   REMOVAL OF STONES  08/13/2018   Procedure: REMOVAL OF STONES;  Surgeon: Rush Landmark Telford Nab., MD;  Location: [place];  Service: Gastroenterology;;  SPHINCTEROTOMY  08/13/2018   Procedure: SPHINCTEROTOMY;  Surgeon: Mansouraty, Telford Nab., MD;  Location: [place];  Service: Gastroenterology;;   UPPER ESOPHAGEAL ENDOSCOPIC ULTRASOUND (EUS) N/A 07/23/2018   Procedure: UPPER ESOPHAGEAL ENDOSCOPIC ULTRASOUND (EUS);  Surgeon: Irving Copas., MD;  Location: [place];  Service: Gastroenterology;  Laterality: N/A;   VASECTOMY      Social History:  reports that he has quit smoking. He has quit using smokeless tobacco.  His smokeless tobacco use included chew. He reports that he does not drink  alcohol and does not use drugs. Family History: family history includes Cancer in his mother; Diabetes in his sister; Heart disease in his father; Hyperlipidemia in his father; Hypertension in his brother; Stomach cancer in his maternal grandfather.   HOME MEDICATIONS: Allergies as of 04/18/2021       Reactions   Caduet [amlodipine-atorvastatin] Hives, Itching   Statins    BLURRED VISION        Medication List        Accurate as of April 18, 2021 12:55 PM. If you have any questions, ask your nurse or doctor.          aspirin 81 MG tablet Take 81 mg by mouth daily.   blood glucose meter kit and supplies Test sugars once dailyDispense based insurance preference. E11.9   Cholecalciferol 125 MCG (5000 UT) capsule Take 5,000 Units by mouth daily.   citalopram 40 MG tablet Commonly known as: CELEXA TAKE 1 TABLET (40 MG TOTAL) BY MOUTH DAILY.   clonazePAM 1 MG tablet Commonly known as: KLONOPIN TAKE 1 TABLET BY MOUTH NIGHTLY FOR RESTLESS LEGS   diclofenac 75 MG EC tablet Commonly known as: VOLTAREN Take 1 tablet (75 mg total) by mouth 2 (two) times daily between meals as needed.   ezetimibe 10 MG tablet Commonly known as: ZETIA TAKE 1 TABLET BY MOUTH DAILY FOR CHOLESTEROL   freestyle lancets USE TO TEST BLOOD GLUCOSE ONCE A DAY   FreeStyle Libre 14 Day Sensor Misc USE AS DIRECTED TO CHECK BLOOD SUGAR THREE TIMES DAILY   FREESTYLE LITE test strip Generic drug: glucose blood Check  Blood Sugar  2 times daily   gabapentin 300 MG capsule Commonly known as: NEURONTIN Take 3 capsules (900 mg total) by mouth every night for chronic pain or sleep   ibuprofen 200 MG tablet Commonly known as: ADVIL Take 600 mg by mouth every 6 (six) hours as needed for headache, mild pain, moderate pain or cramping.   losartan 50 MG tablet Commonly known as: COZAAR TAKE 1 TABLET (50 MG TOTAL) BY MOUTH DAILY.   Magnesium 100 MG Caps Take 100 mg by mouth daily.   metFORMIN  500 MG 24 hr tablet Commonly known as: GLUCOPHAGE-XR TAKE 1 TABLET BY MOUTH 2 TIMES A DAY.   omeprazole 20 MG capsule Commonly known as: PRILOSEC TAKE 1 CAPSULE (20 MG TOTAL) BY MOUTH DAILY.   Ozempic (0.25 or 0.5 MG/DOSE) 2 MG/1.5ML Sopn Generic drug: Semaglutide(0.25 or 0.5MG/DOS) Inject 0.5 mg into the skin once a week.   rOPINIRole 1 MG tablet Commonly known as: Requip Take  1/2 to 1 tablet by mouth 1 hour before bedtime  for restless legs.   rosuvastatin 20 MG tablet Commonly known as: Crestor Take 1 tablet (20 mg total) by mouth daily for cholesterol.   Synjardy XR 25-1000 MG Tb24 Generic drug: Empagliflozin-metFORMIN HCl ER TAKE 1 TABLET BY MOUTH ONCE DAILY.   TURMERIC PO Take 1 capsule by mouth 2 (two) times  daily.          REVIEW OF SYSTEMS: A comprehensive ROS was conducted with the patient and is negative except as per HPI    OBJECTIVE:  VS: BP 120/74 (BP Location: Right Arm, Patient Position: Sitting, Cuff Size: Small)   Pulse 74   Ht 6' (1.829 m)   Wt 233 lb (105.7 kg)   SpO2 96%   BMI 31.60 kg/m    Wt Readings from Last 3 Encounters:  03/06/21 227 lb (103 kg)  12/15/20 237 lb (107.5 kg)  08/30/20 240 lb 12.8 oz (109.2 kg)     EXAM: General: Pt appears well and is in NAD  Eyes: External eye exam normal without stare, lid lag or exophthalmos.  EOM intact.   Normal confrontation visual field testing   Neck: General: Supple without adenopathy. Thyroid: Thyroid size normal.  No goiter or nodules appreciated  Lungs: Clear with good BS bilat with no rales, rhonchi, or wheezes  Heart: Auscultation: RRR.  Abdomen: Normoactive bowel sounds, soft, nontender, without masses or organomegaly palpable  Extremities:  BL LE: No pretibial edema normal ROM and strength.  Skin: Hair: Texture and amount normal with gender appropriate distribution Skin Inspection: No rashes Skin Palpation: Skin temperature, texture, and thickness normal to palpation  Neuro:   DTRs: 2+ and symmetric in UE without delay in relaxation phase  Mental Status: Judgment, insight: Intact Orientation: Oriented to time, place, and person Mood and affect: No depression, anxiety, or agitation     DATA REVIEWED:  Results for DAYMIAN, LILL (MRN [id number]) as of 04/20/2021 09:05  Ref. Range 04/19/2021 08:21  Sodium Latest Ref Range: 135 - 145 mEq/L 140  Potassium Latest Ref Range: 3.5 - 5.1 mEq/L 4.3  Chloride Latest Ref Range: 96 - 112 mEq/L 104  CO2 Latest Ref Range: 19 - 32 mEq/L 29  Glucose Latest Ref Range: 70 - 99 mg/dL 106 (H)  BUN Latest Ref Range: 6 - 23 mg/dL 19  Creatinine Latest Ref Range: 0.40 - 1.50 mg/dL 1.15  Calcium Latest Ref Range: 8.4 - 10.5 mg/dL 9.7  Alkaline Phosphatase Latest Ref Range: 39 - 117 U/L 44  Albumin Latest Ref Range: 3.5 - 5.2 g/dL 4.2  AST Latest Ref Range: 0 - 37 U/L 31  ALT Latest Ref Range: 0 - 53 U/L 37  Total Protein Latest Ref Range: 6.0 - 8.3 g/dL 7.0  Total Bilirubin Latest Ref Range: 0.2 - 1.2 mg/dL 0.7  GFR Latest Ref Range: >60.00 mL/min 66.38  VITD Latest Ref Range: 30.00 - 100.00 ng/mL 34.84  Cortisol, Plasma Latest Units: ug/dL 14.5  LH Latest Ref Range: 1.50 - 9.30 mIU/mL 4.16  Prolactin Latest Ref Range: 2.0 - 18.0 ng/mL 6.1  TSH Latest Ref Range: 0.35 - 5.50 uIU/mL 4.20  T4,Free(Direct) Latest Ref Range: 0.60 - 1.60 ng/dL 0.65     MRI brain 03/09/2021  Brain: No acute infarct, mass effect or extra-axial collection. No acute or chronic hemorrhage. Normal white matter signal, parenchymal volume and CSF spaces.   Pituitary/Sella: There is a low T1-weighted signal intra sellar mass that measures 9 x 10 x 11 mm with a small internal area of contrast enhancement. The normal pituitary gland is displaced to the right. The infundibulum is deviated to the right. The hypothalamus and mamillary bodies are normal. There is no mass effect on the optic chiasm or optic nerves. The infundibular and chiasmatic recesses  are clear. Normal cavernous sinus and cavernous internal carotid artery flow voids.  Vascular: Major flow voids are preserved.   Skull and upper cervical spine: Normal calvarium and skull base. Visualized upper cervical spine and soft tissues are normal.   Sinuses/Orbits:No paranasal sinus fluid levels or advanced mucosal thickening. No mastoid or middle ear effusion. Normal orbits.   IMPRESSION: 1. Intra sellar mass measuring 9 x 10 x 11 mm with small internal area of contrast enhancement. This may represent a pituitary macroadenoma. 2. No mass effect on the optic chiasm or optic nerves.      CT head 03/06/2021  Brain: No evidence of acute infarction, hemorrhage, hydrocephalus, or extra-axial fluid collection. Enlarged pituitary gland with upward convexity, measuring approximately 13 mm in craniocaudal dimension. There is some suprasellar extension. Mild scattered white matter hypoattenuation, nonspecific but most likely related to chronic microvascular disease.   Vascular: See below.   Skull: No acute fracture.   Sinuses: Visualized sinuses are clear.   Orbits: No acute finding.   Review of the MIP images confirms the above findings   CTA NECK FINDINGS   Aortic arch: Great vessel origins are patent.   Right carotid system: Predominately calcific atherosclerosis at the carotid bifurcation without greater than 50% stenosis.   Left carotid system: Predominately calcific atherosclerosis at the carotid bifurcation without greater than 50% stenosis.   Vertebral arteries: Right dominant. Approximately 50% stenosis of the right vertebral artery origin. Poor visualization of the proximal left vertebral artery due to edema and streak hardening artifact. These left vertebral artery is small throughout its course.   Skeleton: Moderate multilevel degenerative change with disc height loss, endplate sclerosis and posterior disc osteophyte complexes.   Other neck: No acute  abnormality.   Upper chest: Visualized lung apices are clear.   Review of the MIP images confirms the above findings   CTA HEAD FINDINGS   Limited is a due to venous contamination.  Within this limitation:   Anterior circulation: Patent intracranial ICAs bilaterally with mild stenosis of the left ICA due to calcific atherosclerosis. Bilateral MCAs and ACAs are patent without proximal hemodynamically significant stenosis. No aneurysm identified.   Posterior circulation: Likely severe stenosis of the small/non dominant left vertebral artery at its dural margin with diminutive left intradural vertebral artery. Right intradural vertebral artery is patent without significant stenosis. Moderate stenosis of the proximal basilar artery. Basilar remains patent. Bilateral posterior cerebral arteries are patent with limited evaluation distally due to venous contamination. No aneurysm identified.   Venous sinuses: As permitted by contrast timing, patent.   Review of the MIP images confirms the above findings   IMPRESSION: CT head:   1. Enlarged pituitary gland with upward convexity, concerning for pituitary mass. Recommend pituitary protocol MRI with contrast and correlation with pituitary function labs. 2. Otherwise, no evidence of acute intracranial abnormality  ASSESSMENT/PLAN/RECOMMENDATIONS:   Pituitary Macroadenoma :   - Pt with headaches  - He was advised to have a visual field test at ophthalmology  - There's no clinical suspicion for hypo or hyperpituitarism at this time  - TFT, Prolactin, LH, FSH and cortisol normal  - Testosterone, IGF-1 and ACTH pending  - Will consider repeat imaging in 6 months     2. FH of Multiple Endocrine Neoplasia (MEN I)  - Mother and sister with MEN I , he has not been testing in the past, will discuss this on next visit  - At this time there's no evidence of hyperparathyroidism with normal Calcium - PTH - pending  - Abdominal imaging  2020 id not show  evidence of  pancreatic tumors.   F/U in 6 months   Signed electronically by: Mack Guise, MD  [facility] End[facility]  [facility] [address] [place], [place] [address] Phone: [phone] FAX: [phone]   CC: Unk Pinto, MD [address] [place] [address] Phone: [phone] Fax: [phone]   Return to Endocrinology clinic as below: Future Appointments  Date Time Provider [place]  04/18/2021  2:40 PM [place] Hoback, Melanie Crazier, MD [facility]  05/16/2021 10:00 AM Magda Bernheim, NP [id number] None

## 2021-04-19 ENCOUNTER — Other Ambulatory Visit (INDEPENDENT_AMBULATORY_CARE_PROVIDER_SITE_OTHER): Payer: 59

## 2021-04-19 DIAGNOSIS — Z8341 Family history of multiple endocrine neoplasia [MEN] syndrome: Secondary | ICD-10-CM | POA: Diagnosis not present

## 2021-04-19 DIAGNOSIS — D352 Benign neoplasm of pituitary gland: Secondary | ICD-10-CM

## 2021-04-19 LAB — COMPREHENSIVE METABOLIC PANEL
ALT: 37 U/L (ref 0–53)
AST: 31 U/L (ref 0–37)
Albumin: 4.2 g/dL (ref 3.5–5.2)
Alkaline Phosphatase: 44 U/L (ref 39–117)
BUN: 19 mg/dL (ref 6–23)
CO2: 29 mEq/L (ref 19–32)
Calcium: 9.7 mg/dL (ref 8.4–10.5)
Chloride: 104 mEq/L (ref 96–112)
Creatinine, Ser: 1.15 mg/dL (ref 0.40–1.50)
GFR: 66.38 mL/min (ref >60.00)
Glucose, Bld: 106 mg/dL — ABNORMAL HIGH (ref 70–99)
Potassium: 4.3 mEq/L (ref 3.5–5.1)
Sodium: 140 mEq/L (ref 135–145)
Total Bilirubin: 0.7 mg/dL (ref 0.2–1.2)
Total Protein: 7 g/dL (ref 6.0–8.3)

## 2021-04-19 LAB — LUTEINIZING HORMONE: LH: 4.16 m[IU]/mL (ref 1.50–9.30)

## 2021-04-19 LAB — CORTISOL: Cortisol, Plasma: 14.5 ug/dL

## 2021-04-19 LAB — TSH: TSH: 4.2 u[IU]/mL (ref 0.35–5.50)

## 2021-04-19 LAB — VITAMIN D 25 HYDROXY (VIT D DEFICIENCY, FRACTURES): VITD: 34.84 ng/mL (ref 30.00–100.00)

## 2021-04-19 LAB — T4, FREE: Free T4: 0.65 ng/dL (ref 0.60–1.60)

## 2021-04-20 ENCOUNTER — Other Ambulatory Visit (HOSPITAL_COMMUNITY): Payer: Self-pay

## 2021-04-20 MED FILL — Losartan Potassium Tab 50 MG: ORAL | 90 days supply | Qty: 90 | Fill #2 | Status: AC

## 2021-04-22 ENCOUNTER — Encounter: Payer: Self-pay | Admitting: Internal Medicine

## 2021-04-24 LAB — PROLACTIN: Prolactin: 6.1 ng/mL (ref 2.0–18.0)

## 2021-04-24 LAB — ACTH: C206 ACTH: 47 pg/mL (ref 6–50)

## 2021-04-24 LAB — INSULIN-LIKE GROWTH FACTOR
IGF-I, LC/MS: 110 ng/mL (ref 41–279)
Z-Score (Male): -0.1 SD (ref ?–2.0)

## 2021-04-24 LAB — PARATHYROID HORMONE, INTACT (NO CA): PTH: 18 pg/mL (ref 16–77)

## 2021-04-24 LAB — TESTOSTERONE, TOTAL, LC/MS/MS: Testosterone, Total, LC-MS-MS: 278 ng/dL (ref 250–1100)

## 2021-05-06 ENCOUNTER — Other Ambulatory Visit (HOSPITAL_COMMUNITY): Payer: Self-pay

## 2021-05-06 ENCOUNTER — Other Ambulatory Visit: Payer: Self-pay | Admitting: Nurse Practitioner

## 2021-05-06 DIAGNOSIS — N181 Chronic kidney disease, stage 1: Secondary | ICD-10-CM

## 2021-05-06 DIAGNOSIS — E1122 Type 2 diabetes mellitus with diabetic chronic kidney disease: Secondary | ICD-10-CM

## 2021-05-06 MED ORDER — OZEMPIC (0.25 OR 0.5 MG/DOSE) 2 MG/1.5ML ~~LOC~~ SOPN
0.5000 mg | PEN_INJECTOR | SUBCUTANEOUS | 0 refills | Status: DC
Start: 2021-05-06 — End: 2021-06-10
  Filled 2021-05-06: qty 1.5, 28d supply, fill #0

## 2021-05-08 ENCOUNTER — Other Ambulatory Visit (HOSPITAL_COMMUNITY): Payer: Self-pay

## 2021-05-09 ENCOUNTER — Encounter: Payer: 59 | Admitting: Adult Health Nurse Practitioner

## 2021-05-11 NOTE — Progress Notes (Signed)
COMPLETE PHYSICAL     Assessment and Plan:  Glenn Stephens was seen today for annual exam.  Diagnoses and all orders for this visit:  Encounter for general adult medical examination with abnormal findings Yearly  Type 2 diabetes mellitus with stage 2 chronic kidney disease, without long-term current use of insulin (HCC) Continue medications:Synjardy XR 25-1000 daily and metformin XR 531m BID. Ozempic 0.5 mg QW Discussed general issues about diabetes pathophysiology and management. Education: Reviewed 'ABCs' of diabetes management (respective goals in parentheses):  A1C (<7), blood pressure (<130/80), and cholesterol (LDL <70) Dietary recommendations Encouraged aerobic exercise.  Discussed foot care, check daily Yearly retinal exam Dental exam every 6 months Monitor blood glucose, discussed goal for patient -     Hemoglobin A1c - Routine urine with reflex microscopic - Microalbumin/creatinine urine ratio  Essential hypertension Continue current medications: Losartan 559mMonitor blood pressure at home; call if consistently over 130/80 Continue DASH diet.   Reminder to go to the ER if any CP, SOB, nausea, dizziness, severe HA, changes vision/speech, left arm numbness and tingling and jaw pain. -     CBC with Differential/Platelet -     COMPLETE METABOLIC PANEL WITH GFR -     TSH -     Magnesium  EKG 12 lead  Hyperlipidemia, unspecified hyperlipidemia type/Statin myopathy Unable to take statins or Zetia due to myopathys Discussed coronary calcium score, pt considering Discussed dietary and exercise modifications Low fat diet -     Lipid panel  Nodule of left lung Repeat CXR was suggested in 3 -6 months- will order at next visit  Vitamin D deficiency Continue supplementation to maintain goal of 70-100 Taking Vitamin D5,000 IU daily -     VITAMIN D 25 Hydroxy (Vit-D Deficiency, Fractures)  OSA on CPAP Continue CPAP/BiPAP, using nightly for at least 8 hours  Helping with  daytime fatigue Weight loss still advised Discussed mask & tubing hygeine  Diabetic peripheral neuropathy associated with type 2 diabetes mellitus (HCC) Doing well at this time Checks feet daily Gabapentin 30024mGeneralized anxiety disorder Citalopram 40m39mnd clonazepam 1mg 16mping with RLS as well  Class I Obesity with serious comorbidity Discussed dietary and exercise modifications  Asymptomatic varicose veins of both lower extremities Discussed compression stockings  Primary insomnia Doing well Gabapentin 300mg 50mty  Medication management Magnesium  Vitamin D deficiency Continue Vit D supplementation Check Vit D  Pituitary Microadenoma Continue to follow with Dr. ShamleKelton Pillarvisual fields tested at ophthalmologist- will schedule appointment  BPH with lower urinary tract symptoms PSA Only getting up once a night, will continue to monitor  Restless leg syndrome No longer taking requip, has no trouble falling asleep and is sleeping in separate bedroom from wife   Discussed med's effects and SE's. Screening labs and tests as requested with regular follow-up as recommended. Over 40 minutes of face to face interview,  exam, counseling, chart review and critical decision making was performed  Future Appointments  Date Time Provider DepartKaplan/2023 11:30 AM Giamarie Bueche, Magda BernheimAAM-[facility] None  10/25/2021  9:30 AM [name], IbtehaMelanie CrazierBPC-[facility] None  05/16/2022 10:00 AM Lutie Pickler, Magda BernheimAAM-[facility] None    HPI Patient presents for a complete physical.  65 y.o25male following up on HTN, HLD, DMII, GERD, weight and vitamin D defciency.  His blood pressure has not monitored at home, today their BP is BP: 130/70  BP Readings from Last 3 Encounters:  05/16/21 130/70  04/18/21 120/74  03/06/21 (!) 154/80    BMI is Body mass index is 31.09 kg/m., he has not been working on diet and exercise. 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  He is on celexa for anxiety, only on 1/2 a pill.  He is on cholesterol medication, he is on zetia and crestor, and endorses myalgias.  He stopped rosuvastatin once before but cholesterol increased so he restarted this.  His cholesterol is not at goal less than 70. The cholesterol last visit was:   Lab Results  Component Value Date   CHOL 257 (H) 12/15/2020   HDL 36 (L) 12/15/2020   LDLCALC 158 (H) 12/15/2020   TRIG 373 (H) 12/15/2020   CHOLHDL 7.1 (H) 12/15/2020    He is checking his blood glucose 3-5 times a day and as needed. He has been working on diet and exercise for diabetes  with CKD is on ACE/ARB With hyperlipidemia he iwas on crestor 58m and zetia 142m he is on synjardy XR 25/1000 once  he is on bASA Checking sugars 110- 150 Eye Exam: needs to schedule eye exam and denies paresthesia of the feet, polydipsia, polyuria and visual disturbances.  Last A1C in the office was:  Lab Results  Component Value Date   HGBA1C 7.6 (H) 12/15/2020   Lab Results  Component Value Date   GFRNONAA >60 03/06/2021   Lab Results  Component Value Date   CHOL 257 (H) 12/15/2020   HDL 36 (L) 12/15/2020   LDLCALC 158 (H) 12/15/2020   TRIG 373 (H) 12/15/2020   CHOLHDL 7.1 (H) 12/15/2020    Patient is on Vitamin D supplement, not on vitamin D.   Lab Results  Component Value Date   VD25OH 34.84 04/19/2021     Last PSA was: Lab Results  Component Value Date   PSA 0.1 05/06/2019      Current Medications:  Current Outpatient Medications on File Prior to Visit  Medication Sig Dispense Refill   aspirin 81 MG tablet Take 81 mg by mouth daily.     Cholecalciferol 125 MCG (5000 UT) capsule Take  5,000 Units by mouth daily.     CINNAMON PO Take by mouth.     citalopram (CELEXA) 40 MG tablet TAKE 1 TABLET (40 MG TOTAL) BY MOUTH DAILY. 90 tablet 2   clonazePAM (KLONOPIN) 1 MG tablet TAKE 1 TABLET BY MOUTH NIGHTLY FOR RESTLESS LEGS 90 tablet 0   diclofenac (VOLTAREN) 75 MG EC tablet Take 1 tablet (75 mg total) by mouth 2 (two) times daily between meals as needed. 60 tablet 2   gabapentin (NEURONTIN) 300 MG capsule Take 3 capsules (900 mg total) by mouth every night for chronic pain or sleep 270 capsule 1   losartan (COZAAR) 50 MG tablet TAKE 1 TABLET (50 MG TOTAL) BY MOUTH DAILY. 30 tablet 11   Omega-3 Fatty Acids (FISH OIL) 600 MG CAPS Take by mouth.     omeprazole (PRILOSEC) 20 MG capsule TAKE 1 CAPSULE (20 MG TOTAL) BY MOUTH DAILY. 90 capsule 2   Semaglutide,0.25 or 0.5MG/DOS, (OZEMPIC, 0.25 OR 0.5 MG/DOSE,) 2 MG/1.5ML SOPN Inject 0.5 mg into the skin once a  week. 1.5 mL 0   TURMERIC PO Take 1 capsule by mouth 2 (two) times daily.      blood glucose meter kit and supplies Test sugars once dailyDispense based insurance preference. E11.9 1 each 0   Empagliflozin-metFORMIN HCl ER (SYNJARDY XR) 25-1000 MG TB24 TAKE 1 TABLET BY MOUTH ONCE DAILY. (Patient not taking: Reported on 04/18/2021) 90 tablet 1   glucose blood (FREESTYLE LITE) test strip Check  Blood Sugar  2 times daily 200 strip 3   ibuprofen (ADVIL,MOTRIN) 200 MG tablet Take 600 mg by mouth every 6 (six) hours as needed for headache, mild pain, moderate pain or cramping. (Patient not taking: Reported on 05/16/2021)     Lancets (FREESTYLE) lancets USE TO TEST BLOOD GLUCOSE ONCE A DAY 100 each 0   rOPINIRole (REQUIP) 1 MG tablet Take  1/2 to 1 tablet by mouth 1 hour before bedtime  for restless legs. (Patient not taking: Reported on 05/16/2021) 90 tablet 0   No current facility-administered medications on file prior to visit.   Health Maintenance:  Immunization History  Administered Date(s) Administered   Influenza-Unspecified  05/07/2013, 04/10/2021   Pneumococcal Polysaccharide-23 06/24/2013   Td 07/09/2006   Tetanus: 2016 at work Pneumovax: 2014 Prevnar 13: Flu vaccine: at work 2021 Zostavax/Shingrix: Discussed with patient DEXA: Colonoscopy: 05/2018 ERCP 08/2018 EGD: Sleep study 2017 Eye Exam: [facility] 2021 Dentist: Dentures, fit well.  Patient Care Team: Unk Pinto, MD as PCP - General (Internal Medicine) Carol Ada, MD as Consulting Physician (Gastroenterology)  Dr. Johnsie Cancel 2007 normal stress test Dr. Kellie Simmering Dr. Benson [place] Dr. Lorin Mercy  Dr. Kristian Covey  Medical History:  Past Medical History:  Diagnosis Date   Acute calculous cholecystitis s/p lap cholecystectopmy 05/23/2018 05/22/2018   Cancer ([place])    skin cancer - basal cell   Diabetes mellitus without complication ([place])    Full dentures    GERD (gastroesophageal reflux disease)    Heart murmur    no problems per pt   Hernia, abdominal    Hyperlipidemia    Hypertension    OSA (obstructive sleep apnea) 08/03/2013   pt does not use CPAP   Pneumonia    as a small child only   Superficial thrombophlebitis of left upper extremity 06/18/2018   Unspecified vitamin D deficiency    Wears glasses    Allergies Allergies  Allergen Reactions   Caduet [Amlodipine-Atorvastatin] Hives and Itching   Statins     BLURRED VISION    SURGICAL HISTORY He  has a past surgical history that includes orthopedic surgeries; Laser ablation; Vasectomy; Metatarsal osteotomy with bunionectomy; Cholecystectomy (N/A, 05/23/2018); Multiple tooth extractions; Upper esophageal endoscopic ultrasound (eus) (N/A, 07/23/2018); biopsy (07/23/2018); Esophagogastroduodenoscopy (egd) with propofol (N/A, 07/23/2018); Endoscopic retrograde cholangiopancreatography (ercp) with propofol (N/A, 08/13/2018); biopsy (08/13/2018); sphincterotomy (08/13/2018); and removal of stones (08/13/2018). FAMILY HISTORY His family history includes Cancer in his mother; Diabetes in his sister;  Heart disease in his father; Hyperlipidemia in his father; Hypertension in his brother; Stomach cancer in his maternal grandfather. SOCIAL HISTORY He  reports that he has quit smoking. He has quit using smokeless tobacco.  His smokeless tobacco use included chew. He reports that he does not drink alcohol and does not use drugs.  Review of Systems:  Review of Systems  Constitutional: Negative.  Negative for chills and fever.  HENT:  Positive for tinnitus. Negative for congestion, ear discharge, ear pain, hearing loss, nosebleeds, sinus pain and sore throat.   Eyes:  Positive for blurred vision (in  morning but clears during the day). Negative for double vision.  Respiratory: Negative.  Negative for cough, hemoptysis, sputum production, shortness of breath, wheezing and stridor.   Cardiovascular:  Negative for chest pain, palpitations, orthopnea, claudication, leg swelling and PND.  Gastrointestinal:  Positive for constipation (uses metamucil). Negative for abdominal pain, diarrhea, heartburn, nausea and vomiting.  Genitourinary:  Negative for dysuria, frequency and urgency.       Gets up once at night to urinate  Musculoskeletal:  Positive for back pain and joint pain (knees). Negative for falls, myalgias and neck pain.  Skin: Negative.  Negative for rash.  Neurological: Negative.  Negative for dizziness, tingling, tremors, weakness and headaches.  Endo/Heme/Allergies:  Does not bruise/bleed easily.  Psychiatric/Behavioral:  Negative for depression, hallucinations, memory loss, substance abuse and suicidal ideas. The patient has insomnia. The patient is not nervous/anxious.    Physical Exam: Estimated body mass index is 31.09 kg/m as calculated from the following:   Height as of this encounter: 6' (1.829 m).   Weight as of this encounter: 229 lb 3.2 oz (104 kg). BP 130/70   Pulse 68   Temp 97.7 F (36.5 C)   Ht 6' (1.829 m)   Wt 229 lb 3.2 oz (104 kg)   SpO2 97%   BMI 31.09 kg/m   General Appearance: Well nourished, in no apparent distress.  Eyes: PERRLA, EOMs, conjunctiva no swelling or erythema, normal fundi and vessels.  Sinuses: No Frontal/maxillary tenderness  ENT/Mouth: Ext aud canals clear, normal light reflex with TMs without erythema, bulging. Good dentition. No erythema, swelling, or exudate on post pharynx. Tonsils not swollen or erythematous. Hearing normal.  Neck: Supple, thyroid normal. No bruits  Respiratory: Respiratory effort normal, BS equal bilaterally without rales, rhonchi, wheezing or stridor.  Cardio: RRR without murmurs, rubs or gallops. Brisk peripheral pulses without edema.  Chest: symmetric, with normal excursions and percussion.  Abdomen: Soft, nontender, no guarding, rebound, hernias, masses, or organomegaly.  Lymphatics: Non tender without lymphadenopathy.  Genitourinary: defer Musculoskeletal: Full ROM all peripheral extremities,5/5 strength, and normal gait. Bilateral varicose veins lower legs    Skin: Warm, dry without rashes, lesions, ecchymosis. Neuro: Cranial nerves intact, reflexes equal bilaterally. Normal muscle tone, no cerebellar symptoms. Sensation decreased bilateral feet to ankle. Psych: Awake and oriented X 3, normal affect, Insight and Judgment appropriate.     EKG: NSR, AV BLOCK I  AORTA SCAN: defer  Kinzey Sheriff W Shaylen Nephew 10:16 AM [facility]

## 2021-05-16 ENCOUNTER — Encounter: Payer: Self-pay | Admitting: Nurse Practitioner

## 2021-05-16 ENCOUNTER — Ambulatory Visit (INDEPENDENT_AMBULATORY_CARE_PROVIDER_SITE_OTHER): Payer: 59 | Admitting: Nurse Practitioner

## 2021-05-16 ENCOUNTER — Other Ambulatory Visit: Payer: Self-pay

## 2021-05-16 VITALS — BP 130/70 | HR 68 | Temp 97.7°F | Ht 72.0 in | Wt 229.2 lb

## 2021-05-16 DIAGNOSIS — F411 Generalized anxiety disorder: Secondary | ICD-10-CM

## 2021-05-16 DIAGNOSIS — R911 Solitary pulmonary nodule: Secondary | ICD-10-CM

## 2021-05-16 DIAGNOSIS — E1142 Type 2 diabetes mellitus with diabetic polyneuropathy: Secondary | ICD-10-CM

## 2021-05-16 DIAGNOSIS — Z0000 Encounter for general adult medical examination without abnormal findings: Secondary | ICD-10-CM | POA: Diagnosis not present

## 2021-05-16 DIAGNOSIS — G4733 Obstructive sleep apnea (adult) (pediatric): Secondary | ICD-10-CM

## 2021-05-16 DIAGNOSIS — G720 Drug-induced myopathy: Secondary | ICD-10-CM

## 2021-05-16 DIAGNOSIS — I10 Essential (primary) hypertension: Secondary | ICD-10-CM | POA: Diagnosis not present

## 2021-05-16 DIAGNOSIS — E559 Vitamin D deficiency, unspecified: Secondary | ICD-10-CM | POA: Diagnosis not present

## 2021-05-16 DIAGNOSIS — K219 Gastro-esophageal reflux disease without esophagitis: Secondary | ICD-10-CM | POA: Diagnosis not present

## 2021-05-16 DIAGNOSIS — F5101 Primary insomnia: Secondary | ICD-10-CM

## 2021-05-16 DIAGNOSIS — G2581 Restless legs syndrome: Secondary | ICD-10-CM

## 2021-05-16 DIAGNOSIS — D352 Benign neoplasm of pituitary gland: Secondary | ICD-10-CM

## 2021-05-16 DIAGNOSIS — N401 Enlarged prostate with lower urinary tract symptoms: Secondary | ICD-10-CM

## 2021-05-16 DIAGNOSIS — E6609 Other obesity due to excess calories: Secondary | ICD-10-CM

## 2021-05-16 DIAGNOSIS — N182 Chronic kidney disease, stage 2 (mild): Secondary | ICD-10-CM | POA: Diagnosis not present

## 2021-05-16 DIAGNOSIS — Z136 Encounter for screening for cardiovascular disorders: Secondary | ICD-10-CM

## 2021-05-16 DIAGNOSIS — Z0001 Encounter for general adult medical examination with abnormal findings: Secondary | ICD-10-CM

## 2021-05-16 DIAGNOSIS — E0822 Diabetes mellitus due to underlying condition with diabetic chronic kidney disease: Secondary | ICD-10-CM | POA: Diagnosis not present

## 2021-05-16 DIAGNOSIS — I8393 Asymptomatic varicose veins of bilateral lower extremities: Secondary | ICD-10-CM

## 2021-05-16 DIAGNOSIS — E1169 Type 2 diabetes mellitus with other specified complication: Secondary | ICD-10-CM | POA: Diagnosis not present

## 2021-05-16 DIAGNOSIS — E785 Hyperlipidemia, unspecified: Secondary | ICD-10-CM | POA: Diagnosis not present

## 2021-05-16 DIAGNOSIS — Z79899 Other long term (current) drug therapy: Secondary | ICD-10-CM | POA: Diagnosis not present

## 2021-05-16 DIAGNOSIS — T466X5A Adverse effect of antihyperlipidemic and antiarteriosclerotic drugs, initial encounter: Secondary | ICD-10-CM

## 2021-05-16 DIAGNOSIS — Z125 Encounter for screening for malignant neoplasm of prostate: Secondary | ICD-10-CM

## 2021-05-16 NOTE — Patient Instructions (Signed)

## 2021-05-17 ENCOUNTER — Other Ambulatory Visit (HOSPITAL_COMMUNITY): Payer: Self-pay

## 2021-05-17 ENCOUNTER — Other Ambulatory Visit: Payer: Self-pay | Admitting: Nurse Practitioner

## 2021-05-17 DIAGNOSIS — E1169 Type 2 diabetes mellitus with other specified complication: Secondary | ICD-10-CM

## 2021-05-17 MED ORDER — COLESEVELAM HCL 625 MG PO TABS
625.0000 mg | ORAL_TABLET | Freq: Two times a day (BID) | ORAL | 2 refills | Status: DC
  Filled 2021-05-17: qty 60, 30d supply, fill #0
  Filled 2021-06-21: qty 60, 30d supply, fill #1

## 2021-05-18 LAB — COMPLETE METABOLIC PANEL WITH GFR
AG Ratio: 1.6 (calc) (ref 1.0–2.5)
ALT: 40 U/L (ref 9–46)
AST: 31 U/L (ref 10–35)
Albumin: 4.1 g/dL (ref 3.6–5.1)
Alkaline phosphatase (APISO): 44 U/L (ref 35–144)
BUN: 17 mg/dL (ref 7–25)
CO2: 29 mmol/L (ref 20–32)
Calcium: 9.7 mg/dL (ref 8.6–10.3)
Chloride: 104 mmol/L (ref 98–110)
Creat: 0.92 mg/dL (ref 0.70–1.35)
Globulin: 2.6 g/dL (calc) (ref 1.9–3.7)
Glucose, Bld: 103 mg/dL — ABNORMAL HIGH (ref 65–99)
Potassium: 4.7 mmol/L (ref 3.5–5.3)
Sodium: 141 mmol/L (ref 135–146)
Total Bilirubin: 0.8 mg/dL (ref 0.2–1.2)
Total Protein: 6.7 g/dL (ref 6.1–8.1)
eGFR: 92 mL/min/{1.73_m2} (ref >=60)

## 2021-05-18 LAB — CBC WITH DIFFERENTIAL/PLATELET
Absolute Monocytes: 353 cells/uL (ref 200–950)
Basophils Absolute: 37 cells/uL (ref 0–200)
Basophils Relative: 0.6 %
Eosinophils Absolute: 924 cells/uL — ABNORMAL HIGH (ref 15–500)
Eosinophils Relative: 14.9 %
HCT: 39.3 % (ref 38.5–50.0)
Hemoglobin: 13.5 g/dL (ref 13.2–17.1)
Lymphs Abs: 2616 cells/uL (ref 850–3900)
MCH: 29.9 pg (ref 27.0–33.0)
MCHC: 34.4 g/dL (ref 32.0–36.0)
MCV: 86.9 fL (ref 80.0–100.0)
MPV: 9.9 fL (ref 7.5–12.5)
Monocytes Relative: 5.7 %
Neutro Abs: 2269 cells/uL (ref 1500–7800)
Neutrophils Relative %: 36.6 %
Platelets: 197 10*3/uL (ref 140–400)
RBC: 4.52 10*6/uL (ref 4.20–5.80)
RDW: 12.9 % (ref 11.0–15.0)
Total Lymphocyte: 42.2 %
WBC: 6.2 10*3/uL (ref 3.8–10.8)

## 2021-05-18 LAB — LIPID PANEL
Cholesterol: 238 mg/dL — ABNORMAL HIGH (ref <200)
HDL: 32 mg/dL — ABNORMAL LOW (ref >=40)
LDL Cholesterol (Calc): 161 mg/dL (calc) — ABNORMAL HIGH
Non-HDL Cholesterol (Calc): 206 mg/dL (calc) — ABNORMAL HIGH (ref <130)
Total CHOL/HDL Ratio: 7.4 (calc) — ABNORMAL HIGH (ref <5.0)
Triglycerides: 274 mg/dL — ABNORMAL HIGH (ref <150)

## 2021-05-18 LAB — MICROALBUMIN / CREATININE URINE RATIO
Creatinine, Urine: 122 mg/dL (ref 20–320)
Microalb Creat Ratio: 2 mcg/mg creat (ref <30)
Microalb, Ur: 0.3 mg/dL

## 2021-05-18 LAB — URINALYSIS, ROUTINE W REFLEX MICROSCOPIC
Bilirubin Urine: NEGATIVE
Glucose, UA: NEGATIVE
Hgb urine dipstick: NEGATIVE
Ketones, ur: NEGATIVE
Leukocytes,Ua: NEGATIVE
Nitrite: NEGATIVE
Protein, ur: NEGATIVE
Specific Gravity, Urine: 1.018 (ref 1.001–1.035)
pH: 6 (ref 5.0–8.0)

## 2021-05-18 LAB — VITAMIN D 25 HYDROXY (VIT D DEFICIENCY, FRACTURES): Vit D, 25-Hydroxy: 37 ng/mL (ref 30–100)

## 2021-05-18 LAB — PSA: PSA: 0.11 ng/mL (ref <=4.00)

## 2021-05-18 LAB — HEMOGLOBIN A1C
Hgb A1c MFr Bld: 5.6 % of total Hgb (ref <5.7)
Mean Plasma Glucose: 114 mg/dL
eAG (mmol/L): 6.3 mmol/L

## 2021-05-18 LAB — MAGNESIUM: Magnesium: 1.6 mg/dL (ref 1.5–2.5)

## 2021-05-18 LAB — TSH: TSH: 2.35 mIU/L (ref 0.40–4.50)

## 2021-05-25 ENCOUNTER — Encounter: Payer: 59 | Admitting: Internal Medicine

## 2021-06-10 ENCOUNTER — Other Ambulatory Visit: Payer: Self-pay | Admitting: Adult Health Nurse Practitioner

## 2021-06-10 ENCOUNTER — Other Ambulatory Visit: Payer: Self-pay | Admitting: Adult Health

## 2021-06-10 ENCOUNTER — Other Ambulatory Visit: Payer: Self-pay

## 2021-06-10 DIAGNOSIS — N181 Chronic kidney disease, stage 1: Secondary | ICD-10-CM

## 2021-06-10 DIAGNOSIS — E1122 Type 2 diabetes mellitus with diabetic chronic kidney disease: Secondary | ICD-10-CM

## 2021-06-10 MED ORDER — OZEMPIC (0.25 OR 0.5 MG/DOSE) 2 MG/1.5ML ~~LOC~~ SOPN
0.5000 mg | PEN_INJECTOR | SUBCUTANEOUS | 0 refills | Status: DC
  Filled 2021-06-10: qty 1.5, 28d supply, fill #0

## 2021-06-10 MED FILL — Citalopram Hydrobromide Tab 40 MG (Base Equiv): ORAL | 90 days supply | Qty: 90 | Fill #2 | Status: AC

## 2021-06-12 ENCOUNTER — Other Ambulatory Visit (HOSPITAL_COMMUNITY): Payer: Self-pay

## 2021-06-13 ENCOUNTER — Other Ambulatory Visit (HOSPITAL_COMMUNITY): Payer: Self-pay

## 2021-06-21 ENCOUNTER — Other Ambulatory Visit: Payer: Self-pay | Admitting: Adult Health

## 2021-06-21 ENCOUNTER — Other Ambulatory Visit: Payer: Self-pay

## 2021-06-21 ENCOUNTER — Other Ambulatory Visit (HOSPITAL_COMMUNITY): Payer: Self-pay

## 2021-06-21 ENCOUNTER — Other Ambulatory Visit: Payer: Self-pay | Admitting: Adult Health Nurse Practitioner

## 2021-06-21 DIAGNOSIS — R251 Tremor, unspecified: Secondary | ICD-10-CM

## 2021-06-21 MED ORDER — CLONAZEPAM 1 MG PO TABS
1.0000 mg | ORAL_TABLET | Freq: Every evening | ORAL | 0 refills | Status: DC
  Filled 2021-06-21: qty 90, 90d supply, fill #0

## 2021-06-21 MED FILL — Omeprazole Cap Delayed Release 20 MG: ORAL | 90 days supply | Qty: 90 | Fill #0 | Status: AC

## 2021-07-13 ENCOUNTER — Other Ambulatory Visit: Payer: Self-pay | Admitting: Physician Assistant

## 2021-07-13 ENCOUNTER — Other Ambulatory Visit: Payer: Self-pay | Admitting: Nurse Practitioner

## 2021-07-13 ENCOUNTER — Other Ambulatory Visit (HOSPITAL_COMMUNITY): Payer: Self-pay

## 2021-07-13 DIAGNOSIS — E1122 Type 2 diabetes mellitus with diabetic chronic kidney disease: Secondary | ICD-10-CM

## 2021-07-13 MED ORDER — OZEMPIC (0.25 OR 0.5 MG/DOSE) 2 MG/1.5ML ~~LOC~~ SOPN
0.5000 mg | PEN_INJECTOR | SUBCUTANEOUS | 0 refills | Status: DC
  Filled 2021-07-13: qty 1.5, 28d supply, fill #0

## 2021-07-19 ENCOUNTER — Other Ambulatory Visit: Payer: Self-pay | Admitting: Physician Assistant

## 2021-07-19 ENCOUNTER — Other Ambulatory Visit (HOSPITAL_COMMUNITY): Payer: Self-pay

## 2021-07-21 ENCOUNTER — Other Ambulatory Visit (HOSPITAL_COMMUNITY): Payer: Self-pay

## 2021-07-21 ENCOUNTER — Other Ambulatory Visit: Payer: Self-pay | Admitting: Physician Assistant

## 2021-07-21 MED ORDER — DICLOFENAC SODIUM 75 MG PO TBEC
75.0000 mg | DELAYED_RELEASE_TABLET | Freq: Two times a day (BID) | ORAL | 2 refills | Status: DC | PRN
  Filled 2021-07-21: qty 180, 90d supply, fill #0

## 2021-07-21 NOTE — Telephone Encounter (Signed)
Pt's wife calling on behalf of pt. She states the pharmacy is not able to locate his medication refill for Diclofenac and wanted to know if the PA can look into what's going on as the pharmacy can not help them. The best pharmacy on file is [facility] and the best call back number is [phone].

## 2021-08-03 ENCOUNTER — Other Ambulatory Visit (HOSPITAL_COMMUNITY): Payer: Self-pay

## 2021-08-03 MED FILL — Losartan Potassium Tab 50 MG: ORAL | 90 days supply | Qty: 90 | Fill #3 | Status: AC

## 2021-08-19 ENCOUNTER — Other Ambulatory Visit: Payer: Self-pay | Admitting: Nurse Practitioner

## 2021-08-19 DIAGNOSIS — N181 Chronic kidney disease, stage 1: Secondary | ICD-10-CM

## 2021-08-19 DIAGNOSIS — E1122 Type 2 diabetes mellitus with diabetic chronic kidney disease: Secondary | ICD-10-CM

## 2021-08-19 MED ORDER — OZEMPIC (0.25 OR 0.5 MG/DOSE) 2 MG/1.5ML ~~LOC~~ SOPN
0.5000 mg | PEN_INJECTOR | SUBCUTANEOUS | 0 refills | Status: DC
  Filled 2021-08-19: qty 1.5, 28d supply, fill #0

## 2021-08-21 ENCOUNTER — Other Ambulatory Visit (HOSPITAL_COMMUNITY): Payer: Self-pay

## 2021-08-22 NOTE — Progress Notes (Unsigned)
FOLLOW UP  Assessment and Plan: Essential hypertension - continue medications, DASH diet, exercise and monitor at home. Call if greater than 130/80.  -     CBC with Differential/Platelet -     COMPLETE METABOLIC PANEL WITH GFR -     TSH  Hyperlipidemia, unspecified hyperlipidemia type -     Lipid panel check lipids decrease fatty foods increase activity.   Diabetic peripheral neuropathy associated with type 2 diabetes mellitus (HCC) -     Hemoglobin A1c Discussed general issues about diabetes pathophysiology and management., Educational material distributed., Suggested low cholesterol diet., Encouraged aerobic exercise., Discussed foot care., Reminded to get yearly retinal exam.  Type 2 diabetes mellitus with stage 1 chronic kidney disease, without long-term current use of insulin (HCC) -     Hemoglobin A1c Discussed general issues about diabetes pathophysiology and management., Educational material distributed., Suggested low cholesterol diet., Encouraged aerobic exercise., Discussed foot care., Reminded to get yearly retinal exam.  Obesity (BMI 30.0-34.9) - follow up 3 months for progress monitoring - increase veggies, decrease carbs - long discussion about weight loss, diet, and exercise  Vitamin D deficiency -     VITAMIN D 25 Hydroxy (Vit-D Deficiency, Fractures)  Medication management -     Magnesium  Future Appointments  Date Time Provider [place]  08/24/2021 11:30 AM Magda Bernheim, NP [facility] None  10/25/2021  9:30 AM [name], Melanie Crazier, MD [facility] None  05/16/2022 10:00 AM Magda Bernheim, NP [facility] None     Discussed med's effects and SE's. Screening labs and tests as requested with regular follow-up as recommended. Over 30 minutes of exam, counseling, chart review and critical decision making was performed  HPI  66 y.o. WM obese WM with history of HTN, chol, DM2, insomnia/RLS presents for OVERDUE follow up, last seen in  05/06/2019.  His blood pressure has been controlled at home, monitoring at home, today their BP is    BMI is There is no height or weight on file to calculate BMI., he is working on diet and exercise. Last sleep study negative, not on CPAP.  Wt Readings from Last 3 Encounters:  05/16/21 229 lb 3.2 oz (104 kg)  04/18/21 233 lb (105.7 kg)  03/06/21 227 lb (103 kg)   He does not workout, but stays active, has left knee pain, working for [employer], works 3+ days a week, lots of walking, retired Psychologist, occupational.  He denies chest pain, shortness of breath, dizziness.  He is on celexa for anxiety on 40 mg a day.   He has been working on diet and exercise for diabetes  with CKD is on ACE/ARB, Cozaar 33m With hyperlipidemia he is on crestor 579mand zetia 1070me is on synjardy XR 25/1000 once Metformin 500 2 x a day  he is on bASA Checking sugars 140-160 [facility] and denies paresthesia of the feet, polydipsia, polyuria and visual disturbances.  Last A1C in the office was:  Lab Results  Component Value Date   HGBA1C 5.6 05/16/2021   Lab Results  Component Value Date   GFRNONAA >60 03/06/2021   Lab Results  Component Value Date   CHOL 238 (H) 05/16/2021   HDL 32 (L) 05/16/2021   LDLCALC 161 (H) 05/16/2021   TRIG 274 (H) 05/16/2021   CHOLHDL 7.4 (H) 05/16/2021   Patient is on Vitamin D supplement, not on vitamin D.   Lab Results  Component Value Date   VD25OH  37 05/16/2021     Current Medications:   Current Outpatient Medications (Endocrine & Metabolic):    Empagliflozin-metFORMIN HCl ER (SYNJARDY XR) 25-1000 MG TB24, TAKE 1 TABLET BY MOUTH ONCE DAILY. (Patient not taking: Reported on 04/18/2021)   Semaglutide,0.25 or 0.5MG/DOS, (OZEMPIC, 0.25 OR 0.5 MG/DOSE,) 2 MG/1.5ML SOPN, Inject 0.5 mg into the skin once a week.  Current Outpatient Medications (Cardiovascular):    colesevelam (WELCHOL) 625 MG tablet, Take 1 tablet (625 mg total) by  mouth 2 (two) times daily with a meal.   losartan (COZAAR) 50 MG tablet, TAKE 1 TABLET (50 MG TOTAL) BY MOUTH DAILY.   Current Outpatient Medications (Analgesics):    aspirin 81 MG tablet, Take 81 mg by mouth daily.   diclofenac (VOLTAREN) 75 MG EC tablet, Take 1 tablet (75 mg total) by mouth 2 (two) times daily between meals as needed.   Current Outpatient Medications (Other):    blood glucose meter kit and supplies, Test sugars once dailyDispense based insurance preference. E11.9   Cholecalciferol 125 MCG (5000 UT) capsule, Take 5,000 Units by mouth daily.   CINNAMON PO, Take by mouth.   citalopram (CELEXA) 40 MG tablet, TAKE 1 TABLET (40 MG TOTAL) BY MOUTH DAILY.   clonazePAM (KLONOPIN) 1 MG tablet, TAKE 1 TABLET BY MOUTH NIGHTLY FOR RESTLESS LEGS   gabapentin (NEURONTIN) 300 MG capsule, Take 3 capsules (900 mg total) by mouth every night for chronic pain or sleep   glucose blood (FREESTYLE LITE) test strip, Check  Blood Sugar  2 times daily   Lancets (FREESTYLE) lancets, USE TO TEST BLOOD GLUCOSE ONCE A DAY   Omega-3 Fatty Acids (FISH OIL) 600 MG CAPS, Take by mouth.   omeprazole (PRILOSEC) 20 MG capsule, Take 1 capsule (20 mg total) by mouth daily.   TURMERIC PO, Take 1 capsule by mouth 2 (two) times daily.    Medical History:  Past Medical History:  Diagnosis Date   Acute calculous cholecystitis s/p lap cholecystectopmy 05/23/2018 05/22/2018   Cancer ([place])    skin cancer - basal cell   Diabetes mellitus without complication (HCC)    Full dentures    GERD (gastroesophageal reflux disease)    Heart murmur    no problems per pt   Hernia, abdominal    Hyperlipidemia    Hypertension    OSA (obstructive sleep apnea) 08/03/2013   pt does not use CPAP   Pneumonia    as a small child only   Superficial thrombophlebitis of left upper extremity 06/18/2018   Unspecified vitamin D deficiency    Wears glasses    Allergies Allergies  Allergen Reactions   Caduet  [Amlodipine-Atorvastatin] Hives and Itching   Statins     BLURRED VISION    Surgical History: reviewed and unchanged Family History: reviewed and unchanged Social History: reviewed and unchanged  Review of Systems:  Review of Systems  Constitutional: Negative.   HENT:  Negative for congestion, ear discharge, ear pain, hearing loss, nosebleeds, sore throat and tinnitus.   Eyes: Negative.   Respiratory: Negative.  Negative for cough, shortness of breath and stridor.   Cardiovascular:  Positive for leg swelling. Negative for chest pain, palpitations, orthopnea, claudication and PND.  Gastrointestinal: Negative.   Genitourinary: Negative.  Negative for frequency and urgency.  Musculoskeletal:  Positive for back pain and joint pain. Negative for falls, myalgias and neck pain.  Skin: Negative.   Neurological: Negative.  Negative for dizziness and headaches.  Psychiatric/Behavioral:  Negative for depression, hallucinations, memory loss,  substance abuse and suicidal ideas. The patient has insomnia. The patient is not nervous/anxious.    Physical Exam: Estimated body mass index is 31.09 kg/m as calculated from the following:   Height as of 05/16/21: 6' (1.829 m).   Weight as of 05/16/21: 229 lb 3.2 oz (104 kg). There were no vitals taken for this visit. General Appearance: Well nourished, in no apparent distress.  Eyes: PERRLA, EOMs, conjunctiva no swelling or erythema, normal fundi and vessels.  Sinuses: No Frontal/maxillary tenderness  ENT/Mouth: Ext aud canals clear, normal light reflex with TMs without erythema, bulging. Good dentition. No erythema, swelling, or exudate on post pharynx. Tonsils not swollen or erythematous. Hearing normal.  Neck: Supple, thyroid normal. No bruits  Respiratory: Respiratory effort normal, BS equal bilaterally without rales, rhonchi, wheezing or stridor.  Cardio: RRR without murmurs, rubs or gallops. Brisk peripheral pulses without edema.  Chest: symmetric,  with normal excursions and percussion.  Abdomen: Soft, nontender, no guarding, rebound, hernias, masses, or organomegaly.  Lymphatics: Non tender without lymphadenopathy.  Musculoskeletal: Full ROM all peripheral extremities,5/5 strength, and normal gait.Bilateral varicose veins.   Skin: Warm, dry without rashes, lesions, ecchymosis. Neuro: Cranial nerves intact, reflexes equal bilaterally. Normal muscle tone, no cerebellar symptoms. Sensation decreased bilateral feet to ankle. Psych: Awake and oriented X 3, normal affect, Insight and Judgment appropriate.   Fadil Macmaster W Herndon Grill 2:24 PM [facility]

## 2021-08-23 ENCOUNTER — Other Ambulatory Visit: Payer: Self-pay | Admitting: Neurological Surgery

## 2021-08-23 DIAGNOSIS — E236 Other disorders of pituitary gland: Secondary | ICD-10-CM

## 2021-08-24 ENCOUNTER — Other Ambulatory Visit: Payer: Self-pay

## 2021-08-24 ENCOUNTER — Ambulatory Visit: Payer: 59 | Admitting: Nurse Practitioner

## 2021-08-24 DIAGNOSIS — E1169 Type 2 diabetes mellitus with other specified complication: Secondary | ICD-10-CM

## 2021-08-24 DIAGNOSIS — E559 Vitamin D deficiency, unspecified: Secondary | ICD-10-CM

## 2021-08-24 DIAGNOSIS — Z79899 Other long term (current) drug therapy: Secondary | ICD-10-CM

## 2021-08-24 DIAGNOSIS — E6609 Other obesity due to excess calories: Secondary | ICD-10-CM

## 2021-08-24 DIAGNOSIS — I10 Essential (primary) hypertension: Secondary | ICD-10-CM

## 2021-08-24 DIAGNOSIS — E1142 Type 2 diabetes mellitus with diabetic polyneuropathy: Secondary | ICD-10-CM

## 2021-08-24 DIAGNOSIS — N182 Chronic kidney disease, stage 2 (mild): Secondary | ICD-10-CM

## 2021-09-04 ENCOUNTER — Other Ambulatory Visit (HOSPITAL_COMMUNITY): Payer: Self-pay

## 2021-09-04 ENCOUNTER — Other Ambulatory Visit: Payer: Self-pay

## 2021-09-04 ENCOUNTER — Other Ambulatory Visit: Payer: Self-pay | Admitting: Adult Health Nurse Practitioner

## 2021-09-04 MED FILL — Citalopram Hydrobromide Tab 40 MG (Base Equiv): ORAL | 90 days supply | Qty: 90 | Fill #0 | Status: AC

## 2021-09-05 ENCOUNTER — Encounter (HOSPITAL_COMMUNITY): Payer: Self-pay | Admitting: Radiology

## 2021-09-13 ENCOUNTER — Other Ambulatory Visit: Payer: Self-pay | Admitting: Nurse Practitioner

## 2021-09-13 ENCOUNTER — Encounter: Payer: Self-pay | Admitting: Nurse Practitioner

## 2021-09-13 DIAGNOSIS — R911 Solitary pulmonary nodule: Secondary | ICD-10-CM

## 2021-09-14 ENCOUNTER — Ambulatory Visit
Admission: RE | Admit: 2021-09-14 | Discharge: 2021-09-14 | Disposition: A | Discharge status: Home / Self Care | Payer: 59 | Source: Ambulatory Visit | Attending: Nurse Practitioner | Admitting: Nurse Practitioner

## 2021-09-14 DIAGNOSIS — R911 Solitary pulmonary nodule: Secondary | ICD-10-CM

## 2021-09-14 DIAGNOSIS — J984 Other disorders of lung: Secondary | ICD-10-CM | POA: Diagnosis not present

## 2021-09-16 MED FILL — Omeprazole Cap Delayed Release 20 MG: ORAL | 90 days supply | Qty: 90 | Fill #1 | Status: AC

## 2021-09-17 ENCOUNTER — Ambulatory Visit
Admission: RE | Admit: 2021-09-17 | Discharge: 2021-09-17 | Disposition: A | Discharge status: Home / Self Care | Payer: 59 | Source: Ambulatory Visit | Attending: Neurological Surgery | Admitting: Neurological Surgery

## 2021-09-17 ENCOUNTER — Other Ambulatory Visit: Payer: Self-pay

## 2021-09-17 DIAGNOSIS — E237 Disorder of pituitary gland, unspecified: Secondary | ICD-10-CM | POA: Diagnosis not present

## 2021-09-17 DIAGNOSIS — D352 Benign neoplasm of pituitary gland: Secondary | ICD-10-CM | POA: Diagnosis not present

## 2021-09-17 DIAGNOSIS — E236 Other disorders of pituitary gland: Secondary | ICD-10-CM

## 2021-09-17 MED ORDER — GADOBENATE DIMEGLUMINE 529 MG/ML IV SOLN
15.0000 mL | Freq: Once | INTRAVENOUS | Status: AC | PRN
  Administered 2021-09-17: 15 mL via INTRAVENOUS

## 2021-09-18 ENCOUNTER — Other Ambulatory Visit: Payer: Self-pay | Admitting: Nurse Practitioner

## 2021-09-18 ENCOUNTER — Encounter: Payer: Self-pay | Admitting: Nurse Practitioner

## 2021-09-18 ENCOUNTER — Other Ambulatory Visit (HOSPITAL_COMMUNITY): Payer: Self-pay

## 2021-09-18 ENCOUNTER — Encounter: Payer: Self-pay | Admitting: Internal Medicine

## 2021-09-18 DIAGNOSIS — E0822 Diabetes mellitus due to underlying condition with diabetic chronic kidney disease: Secondary | ICD-10-CM

## 2021-09-18 DIAGNOSIS — R918 Other nonspecific abnormal finding of lung field: Secondary | ICD-10-CM

## 2021-09-18 MED ORDER — OZEMPIC (1 MG/DOSE) 4 MG/3ML ~~LOC~~ SOPN
1.0000 mg | PEN_INJECTOR | SUBCUTANEOUS | 2 refills | Status: DC
  Filled 2021-09-18: qty 3, 28d supply, fill #0

## 2021-09-29 ENCOUNTER — Ambulatory Visit
Admission: RE | Admit: 2021-09-29 | Discharge: 2021-09-29 | Disposition: A | Discharge status: Home / Self Care | Payer: 59 | Source: Ambulatory Visit | Attending: Nurse Practitioner | Admitting: Nurse Practitioner

## 2021-09-29 DIAGNOSIS — R918 Other nonspecific abnormal finding of lung field: Secondary | ICD-10-CM | POA: Diagnosis not present

## 2021-09-29 DIAGNOSIS — R911 Solitary pulmonary nodule: Secondary | ICD-10-CM | POA: Diagnosis not present

## 2021-10-02 ENCOUNTER — Encounter: Payer: Self-pay | Admitting: Nurse Practitioner

## 2021-10-07 ENCOUNTER — Other Ambulatory Visit: Payer: Self-pay | Admitting: Nurse Practitioner

## 2021-10-07 DIAGNOSIS — R251 Tremor, unspecified: Secondary | ICD-10-CM

## 2021-10-09 ENCOUNTER — Encounter: Payer: Self-pay | Admitting: Nurse Practitioner

## 2021-10-09 ENCOUNTER — Other Ambulatory Visit: Payer: Self-pay | Admitting: Nurse Practitioner

## 2021-10-09 ENCOUNTER — Other Ambulatory Visit (HOSPITAL_COMMUNITY): Payer: Self-pay

## 2021-10-09 DIAGNOSIS — R251 Tremor, unspecified: Secondary | ICD-10-CM

## 2021-10-09 MED ORDER — CLONAZEPAM 1 MG PO TABS
1.0000 mg | ORAL_TABLET | Freq: Every evening | ORAL | 0 refills | Status: DC
  Filled 2021-10-09: qty 90, 90d supply, fill #0

## 2021-10-09 NOTE — Progress Notes (Signed)
PDMP reviewed for Klonopin refill ?

## 2021-10-21 DIAGNOSIS — D352 Benign neoplasm of pituitary gland: Secondary | ICD-10-CM

## 2021-10-21 HISTORY — DX: Benign neoplasm of pituitary gland: D35.2

## 2021-10-25 ENCOUNTER — Telehealth: Payer: Self-pay | Admitting: Internal Medicine

## 2021-10-25 ENCOUNTER — Ambulatory Visit: Payer: 59 | Admitting: Internal Medicine

## 2021-10-25 ENCOUNTER — Encounter: Payer: Self-pay | Admitting: Internal Medicine

## 2021-10-25 VITALS — BP 138/80 | HR 59 | Ht 72.0 in | Wt 234.0 lb

## 2021-10-25 DIAGNOSIS — D352 Benign neoplasm of pituitary gland: Secondary | ICD-10-CM | POA: Diagnosis not present

## 2021-10-25 DIAGNOSIS — Z8341 Family history of multiple endocrine neoplasia [MEN] syndrome: Secondary | ICD-10-CM

## 2021-10-25 DIAGNOSIS — E559 Vitamin D deficiency, unspecified: Secondary | ICD-10-CM

## 2021-10-25 LAB — COMPREHENSIVE METABOLIC PANEL
ALT: 30 U/L (ref 0–53)
AST: 22 U/L (ref 0–37)
Albumin: 4.4 g/dL (ref 3.5–5.2)
Alkaline Phosphatase: 48 U/L (ref 39–117)
BUN: 22 mg/dL (ref 6–23)
CO2: 28 mEq/L (ref 19–32)
Calcium: 9.4 mg/dL (ref 8.4–10.5)
Chloride: 102 mEq/L (ref 96–112)
Creatinine, Ser: 1.09 mg/dL (ref 0.40–1.50)
GFR: 70.53 mL/min (ref >60.00)
Glucose, Bld: 134 mg/dL — ABNORMAL HIGH (ref 70–99)
Potassium: 4.4 mEq/L (ref 3.5–5.1)
Sodium: 138 mEq/L (ref 135–145)
Total Bilirubin: 0.9 mg/dL (ref 0.2–1.2)
Total Protein: 7.1 g/dL (ref 6.0–8.3)

## 2021-10-25 LAB — VITAMIN D 25 HYDROXY (VIT D DEFICIENCY, FRACTURES): VITD: 26.63 ng/mL — ABNORMAL LOW (ref 30.00–100.00)

## 2021-10-25 LAB — CORTISOL: Cortisol, Plasma: 9 ug/dL

## 2021-10-25 NOTE — Progress Notes (Signed)
? ? ?Name: Glenn Stephens  ?MRN/ DOB: [id number], 09-07-1955    ?Age/ Sex: 66 y.o., male   ? 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   ?Date of Initial Endocrinology Evaluation: 10/25/2021   ? ? ?HPI: ?Glenn Stephens is a 66 y.o. male with a past medical history of T2DM, HTN and OSA on CPAP . The patient presented for initial endocrinology clinic visit on 10/25/2021 for consultative assistance with his Pituitary Macroadenoma .  ? ?During evaluation for neurological deficit 03/2021, he was noted to have a pituitary  adenoma 1.1 cm on brain imgaing  ? ?He has occasional headaches and fatigue as well blurry vision at the time  ?Repeat brain MRI showed regression of pituitary adenoma to 8x6 mm   ? ?Hormonal work up was normal 2022 ?Has a hx of testosterone intake but he did not like them  ? ?Saw neurosurgery - Dr. Ellene Route  ? ?No hx of pancreatitis  ?No personal  or FH of prostate cancer  ?No hx of CAD  ? ?Mother with MEN I  ? ? ?SUBJECTIVE:  ? ? ?Today (10/25/21): Glenn Stephens is here for follow-up on pituitary macroadenoma. ? ?Headaches have resolved  ?Continues with vision changes, has not seen the ophthalmologist yet  ?Denies galactorrhea  ?Has been noted with weight gain  ?No severe HTN - controlled with meds  ?Denies renal stones  ?Denies erectile dysfunction  ? ?He follows with GI  ? ? ? ?HISTORY:  ?Past Medical History:  ?Past Medical History:  ?Diagnosis Date  ? Acute calculous cholecystitis s/p lap cholecystectopmy 05/23/2018 05/22/2018  ? Cancer [facility])   ? skin cancer - basal cell  ? Diabetes mellitus without complication ([place])   ? Full dentures   ? GERD (gastroesophageal reflux disease)   ? Heart murmur   ? no problems per pt  ? Hernia, abdominal   ? Hyperlipidemia   ? Hypertension   ? OSA (obstructive sleep apnea) 08/03/2013  ? pt does not use CPAP  ? Pneumonia   ? as a small child only  ? Superficial thrombophlebitis of left upper extremity 06/18/2018  ?  Unspecified vitamin D deficiency   ? Wears glasses   ? ?Past Surgical History:  ?Past Surgical History:  ?Procedure Laterality Date  ? BIOPSY  07/23/2018  ? Procedure: BIOPSY;  Surgeon: Irving Copas., MD;  Location: [place];  Service: Gastroenterology;;  ? BIOPSY  08/13/2018  ? Procedure: BIOPSY;  Surgeon: Irving Copas., MD;  Location: [place];  Service: Gastroenterology;;  ? CHOLECYSTECTOMY N/A 05/23/2018  ? Procedure: LAPAROSCOPIC CHOLECYSTECTOMY;  Surgeon: Clovis Riley, MD;  Location: [place];  Service: General;  Laterality: N/A;  ? ENDOSCOPIC RETROGRADE CHOLANGIOPANCREATOGRAPHY (ERCP) WITH PROPOFOL N/A 08/13/2018  ? Procedure: ENDOSCOPIC RETROGRADE CHOLANGIOPANCREATOGRAPHY (ERCP) WITH PROPOFOL;  Surgeon: Rush Landmark Telford Nab., MD;  Location: [place];  Service: Gastroenterology;  Laterality: N/A;  ? ESOPHAGOGASTRODUODENOSCOPY (EGD) WITH PROPOFOL N/A 07/23/2018  ? Procedure: ESOPHAGOGASTRODUODENOSCOPY (EGD) WITH PROPOFOL;  Surgeon: Rush Landmark Telford Nab., MD;  Location: [place];  Service: Gastroenterology;  Laterality: N/A;  ? LASER ABLATION    ? veins  ? METATARSAL OSTEOTOMY WITH BUNIONECTOMY    ? MULTIPLE TOOTH EXTRACTIONS    ? orthopedic surgeries    ? knee,foot  ? REMOVAL OF STONES  08/13/2018  ? Procedure: REMOVAL OF STONES;  Surgeon: Rush Landmark Telford Nab., MD;  Location: [place];  Service: Gastroenterology;;  ? SPHINCTEROTOMY  08/13/2018  ?  Procedure: SPHINCTEROTOMY;  Surgeon: Mansouraty, Telford Nab., MD;  Location: [place];  Service: Gastroenterology;;  ? UPPER ESOPHAGEAL ENDOSCOPIC ULTRASOUND (EUS) N/A 07/23/2018  ? Procedure: UPPER ESOPHAGEAL ENDOSCOPIC ULTRASOUND (EUS);  Surgeon: Irving Copas., MD;  Location: [place];  Service: Gastroenterology;  Laterality: N/A;  ? VASECTOMY    ?  ?Social History:  reports that he has quit smoking. He has quit using smokeless tobacco.  His smokeless tobacco use included chew. He reports that he does not drink  alcohol and does not use drugs. ?Family History: family history includes Cancer in his mother; Diabetes in his sister; Heart disease in his father; Hyperlipidemia in his father; Hypertension in his brother; Stomach cancer in his maternal grandfather. ? ? ?HOME MEDICATIONS: ?Allergies as of 10/25/2021   ? ?   Reactions  ? Caduet [amlodipine-atorvastatin] Hives, Itching  ? Statins   ? BLURRED VISION  ? ?  ? ?  ?Medication List  ?  ? ?  ? Accurate as of October 25, 2021  7:21 AM. If you have any questions, ask your nurse or doctor.  ?  ?  ? ?  ? ?aspirin 81 MG tablet ?Take 81 mg by mouth daily. ?  ?blood glucose meter kit and supplies ?Test sugars once dailyDispense based insurance preference. E11.9 ?  ?Cholecalciferol 125 MCG (5000 UT) capsule ?Take 5,000 Units by mouth daily. ?  ?CINNAMON PO ?Take by mouth. ?  ?citalopram 40 MG tablet ?Commonly known as: CELEXA ?Take 1 tablet (40 mg total) by mouth daily. ?  ?clonazePAM 1 MG tablet ?Commonly known as: KLONOPIN ?TAKE 1 TABLET BY MOUTH NIGHTLY FOR RESTLESS LEGS ?  ?colesevelam 625 MG tablet ?Commonly known as: Welchol ?Take 1 tablet (625 mg total) by mouth 2 (two) times daily with a meal. ?  ?diclofenac 75 MG EC tablet ?Commonly known as: VOLTAREN ?Take 1 tablet (75 mg total) by mouth 2 (two) times daily between meals as needed. ?  ?Fish Oil 600 MG Caps ?Take by mouth. ?  ?freestyle lancets ?USE TO TEST BLOOD GLUCOSE ONCE A DAY ?  ?FREESTYLE LITE test strip ?Generic drug: glucose blood ?Check  Blood Sugar  2 times daily ?  ?gabapentin 300 MG capsule ?Commonly known as: NEURONTIN ?Take 3 capsules (900 mg total) by mouth every night for chronic pain or sleep ?  ?losartan 50 MG tablet ?Commonly known as: COZAAR ?TAKE 1 TABLET (50 MG TOTAL) BY MOUTH DAILY. ?  ?omeprazole 20 MG capsule ?Commonly known as: PRILOSEC ?Take 1 capsule (20 mg total) by mouth daily. ?  ?Ozempic (1 MG/DOSE) 4 MG/3ML Sopn ?Generic drug: Semaglutide (1 MG/DOSE) ?Inject 1 mg into the skin once a week. ?   ?Synjardy XR 25-1000 MG Tb24 ?Generic drug: Empagliflozin-metFORMIN HCl ER ?TAKE 1 TABLET BY MOUTH ONCE DAILY. ?  ?TURMERIC PO ?Take 1 capsule by mouth 2 (two) times daily. ?  ? ?  ?  ? ? ?REVIEW OF SYSTEMS: ?A comprehensive ROS was conducted with the patient and is negative except as per HPI  ? ? ?OBJECTIVE:  ?VS: BP 138/80 (BP Location: Left Arm, Patient Position: Sitting, Cuff Size: Large)   Pulse (!) 59   Ht 6' (1.829 m)   Wt 234 lb (106.1 kg)   SpO2 95%   BMI 31.74 kg/m?  ? ? ?Wt Readings from Last 3 Encounters:  ?05/16/21 229 lb 3.2 oz (104 kg)  ?04/18/21 233 lb (105.7 kg)  ?03/06/21 227 lb (103 kg)  ? ? ?EXAM: ?General: Pt appears  well and is in NAD  ?Eyes: External eye exam normal without stare, lid lag or exophthalmos.  EOM intact.    ?Neck: General: Supple without adenopathy. ?Thyroid: Thyroid size normal.  No goiter or nodules appreciated  ?Lungs: Clear with good BS bilat with no rales, rhonchi, or wheezes  ?Heart: Auscultation: RRR.  ?Abdomen: Normoactive bowel sounds, soft, nontender, without masses or organomegaly palpable  ?Extremities:  ?BL LE: No pretibial edema normal ROM and strength.  ?Mental Status: Judgment, insight: Intact ?Orientation: Oriented to time, place, and person ?Mood and affect: No depression, anxiety, or agitation  ? ? ? ?DATA REVIEWED: ? ? Latest Reference Range & Units 10/25/21 09:55  ?Sodium 135 - 145 mEq/L 138  ?Potassium 3.5 - 5.1 mEq/L 4.4  ?Chloride 96 - 112 mEq/L 102  ?CO2 19 - 32 mEq/L 28  ?Glucose 70 - 99 mg/dL 134 (H)  ?BUN 6 - 23 mg/dL 22  ?Creatinine 0.40 - 1.50 mg/dL 1.09  ?Calcium 8.4 - 10.5 mg/dL 9.4  ?Alkaline Phosphatase 39 - 117 U/L 48  ?Albumin 3.5 - 5.2 g/dL 4.4  ?AST 0 - 37 U/L 22  ?ALT 0 - 53 U/L 30  ?Total Protein 6.0 - 8.3 g/dL 7.1  ?Total Bilirubin 0.2 - 1.2 mg/dL 0.9  ?GFR >60.00 mL/min 70.53  ? ? Latest Reference Range & Units 10/25/21 09:55  ?VITD 30.00 - 100.00 ng/mL 26.63 (L)  ? ? Latest Reference Range & Units 10/25/21 09:55  ?Cortisol, Plasma  ug/dL 9.0  ?Prolactin 2.0 - 18.0 ng/mL 5.5  ?Glucose 70 - 99 mg/dL 134 (H)  ?  ? ? ? ? ?MRI 09/17/2021 ?Brain: Regression/collapse of pituitary mass eccentric to the left ?which measures 8 x 6 mm on corona

## 2021-10-25 NOTE — Telephone Encounter (Signed)
Glenn Stephens,  ? ? ?Can you please contact the genetic counselors at the [facility] and see if they test for Multiple Endocrine Neoplasia type ONE ( NOT two ) please.  ? ? ?Please let me know ? ? ?Thanks ?

## 2021-10-25 NOTE — Telephone Encounter (Signed)
Spoke with Santiago Glad at the [facility] and they do test in question.  A referral can be placed.  They are scheduling about 4 weeks out. ?

## 2021-10-25 NOTE — Patient Instructions (Signed)
Please schedule an eye exam for Visual field testing due to pituitary adenoma 8 mm  ?

## 2021-10-26 ENCOUNTER — Telehealth: Payer: Self-pay | Admitting: Genetic Counselor

## 2021-10-26 DIAGNOSIS — D352 Benign neoplasm of pituitary gland: Secondary | ICD-10-CM | POA: Insufficient documentation

## 2021-10-26 DIAGNOSIS — Z8341 Family history of multiple endocrine neoplasia [MEN] syndrome: Secondary | ICD-10-CM | POA: Insufficient documentation

## 2021-10-26 NOTE — Telephone Encounter (Signed)
Scheduled appt per 4/19 referral. Pt's wife is aware of appt date and time. Pt's wife is aware to arrive 15 mins prior to appt time and to bring and updated insurance card. Pt's wife is aware of appt location.   ?

## 2021-10-30 ENCOUNTER — Other Ambulatory Visit: Payer: Self-pay | Admitting: Physician Assistant

## 2021-10-30 ENCOUNTER — Other Ambulatory Visit: Payer: Self-pay | Admitting: Internal Medicine

## 2021-10-31 ENCOUNTER — Encounter: Payer: Self-pay | Admitting: Nurse Practitioner

## 2021-10-31 ENCOUNTER — Other Ambulatory Visit (HOSPITAL_COMMUNITY): Payer: Self-pay

## 2021-10-31 ENCOUNTER — Telehealth: Payer: Self-pay

## 2021-10-31 ENCOUNTER — Other Ambulatory Visit: Payer: Self-pay | Admitting: Physician Assistant

## 2021-10-31 ENCOUNTER — Other Ambulatory Visit: Payer: Self-pay

## 2021-10-31 ENCOUNTER — Ambulatory Visit (INDEPENDENT_AMBULATORY_CARE_PROVIDER_SITE_OTHER): Payer: 59 | Admitting: Nurse Practitioner

## 2021-10-31 VITALS — BP 138/72 | HR 64 | Temp 97.5°F | Wt 232.6 lb

## 2021-10-31 DIAGNOSIS — D352 Benign neoplasm of pituitary gland: Secondary | ICD-10-CM | POA: Diagnosis not present

## 2021-10-31 DIAGNOSIS — E1142 Type 2 diabetes mellitus with diabetic polyneuropathy: Secondary | ICD-10-CM

## 2021-10-31 DIAGNOSIS — F5101 Primary insomnia: Secondary | ICD-10-CM

## 2021-10-31 DIAGNOSIS — E559 Vitamin D deficiency, unspecified: Secondary | ICD-10-CM | POA: Diagnosis not present

## 2021-10-31 DIAGNOSIS — E0822 Diabetes mellitus due to underlying condition with diabetic chronic kidney disease: Secondary | ICD-10-CM | POA: Diagnosis not present

## 2021-10-31 DIAGNOSIS — R911 Solitary pulmonary nodule: Secondary | ICD-10-CM

## 2021-10-31 DIAGNOSIS — E66812 Obesity, class 2: Secondary | ICD-10-CM

## 2021-10-31 DIAGNOSIS — E785 Hyperlipidemia, unspecified: Secondary | ICD-10-CM

## 2021-10-31 DIAGNOSIS — G2581 Restless legs syndrome: Secondary | ICD-10-CM

## 2021-10-31 DIAGNOSIS — Z79899 Other long term (current) drug therapy: Secondary | ICD-10-CM

## 2021-10-31 DIAGNOSIS — I10 Essential (primary) hypertension: Secondary | ICD-10-CM

## 2021-10-31 DIAGNOSIS — N182 Chronic kidney disease, stage 2 (mild): Secondary | ICD-10-CM | POA: Diagnosis not present

## 2021-10-31 DIAGNOSIS — E1169 Type 2 diabetes mellitus with other specified complication: Secondary | ICD-10-CM | POA: Diagnosis not present

## 2021-10-31 DIAGNOSIS — G4733 Obstructive sleep apnea (adult) (pediatric): Secondary | ICD-10-CM | POA: Diagnosis not present

## 2021-10-31 DIAGNOSIS — Z9989 Dependence on other enabling machines and devices: Secondary | ICD-10-CM

## 2021-10-31 MED ORDER — DICLOFENAC SODIUM 75 MG PO TBEC
75.0000 mg | DELAYED_RELEASE_TABLET | Freq: Two times a day (BID) | ORAL | 2 refills | Status: DC | PRN
Start: 2021-10-31 — End: 2022-02-09
  Filled 2021-10-31: qty 60, 30d supply, fill #0
  Filled 2021-11-01: qty 180, 90d supply, fill #0

## 2021-10-31 MED ORDER — LOSARTAN POTASSIUM 50 MG PO TABS
50.0000 mg | ORAL_TABLET | Freq: Every day | ORAL | 11 refills | Status: DC
  Filled 2021-10-31: qty 30, 30d supply, fill #0
  Filled 2021-11-01: qty 90, 90d supply, fill #0
  Filled 2022-01-26: qty 90, 90d supply, fill #1
  Filled 2022-05-01: qty 90, 90d supply, fill #2

## 2021-10-31 NOTE — Telephone Encounter (Signed)
Just sent

## 2021-10-31 NOTE — Progress Notes (Signed)
3 MONTH FOLLOW UP ? ? ? ?Assessment and Plan: ? ?Glenn Stephens was seen today for annual exam. ? ?Diagnoses and all orders for this visit: ? ?Type 2 diabetes mellitus with stage 2 chronic kidney disease, without long-term current use of insulin ([place]) ?Continue medications:Synjardy XR 25-1000 daily and metformin XR 573m BID. Ozempic 0.5 mg QW ?Discussed general issues about diabetes pathophysiology and management. ?Education: Reviewed ?ABCs? of diabetes management (respective goals in parentheses):  A1C (<7), blood pressure (<130/80), and cholesterol (LDL <70) ?Dietary recommendations ?Encouraged aerobic exercise.  ?Discussed foot care, check daily ?Yearly retinal exam- scheduled for May ?Dental exam every 6 months ?Monitor blood glucose, discussed goal for patient ?-     Hemoglobin A1c ? ? ?Essential hypertension ?Continue current medications: Losartan 578m?Monitor blood pressure at home; call if consistently over 130/80 ?Continue DASH diet.   ?Reminder to go to the ER if any CP, SOB, nausea, dizziness, severe HA, changes vision/speech, left arm numbness and tingling and jaw pain. ?-     CBC with Differential/Platelet ?-     COMPLETE METABOLIC PANEL WITH GFR ? ? ?Hyperlipidemia, unspecified hyperlipidemia type/Statin myopathy ?Unable to take statins or Zetia due to myopathys ?Discussed dietary and exercise modifications ?Low fat diet ?-     Lipid panel ? ?Nodule of left lung ?Continue to follow ? ?Vitamin D deficiency ?Continue supplementation to maintain goal of 70-100 ?Taking Vitamin D5,000 IU daily ? ? ?OSA on CPAP ?Continue CPAP/BiPAP, using nightly for at least 8 hours  ?Helping with daytime fatigue ?Weight loss still advised ?Discussed mask & tubing hygeine ? ?Diabetic peripheral neuropathy associated with type 2 diabetes mellitus ([place]?Doing well at this time ?Checks feet daily ?Gabapentin 30038m ?Generalized anxiety disorder ?Citalopram 80m48mnd clonazepam 1mg 28mping with RLS as well ? ?Class 2 severe Obesity  with serious comorbidity ?Discussed dietary and exercise modifications ? ?Asymptomatic varicose veins of both lower extremities ?Discussed compression stockings ? ?Primary insomnia ?Doing well ?Gabapentin 300mg 32mty ? ?Medication management ?Magnesium ? ?Vitamin D deficiency ?Continue Vit D supplementation ?Check Vit D ? ?Pituitary Microadenoma ?Continue to follow with Dr. ShamleKelton Pillar visual fields tested at ophthalmologist- has appt schedule for May ?Has appointment to see Neurosurgery ? ? ?Restless leg syndrome ?No longer taking requip, has no trouble falling asleep and is sleeping in separate bedroom from wife ? ? ?Discussed med's effects and SE's. Screening labs and tests as requested with regular follow-up as recommended. ?Over 40 minutes of face to face interview,  exam, counseling, chart review and critical decision making was performed ? ?Future Appointments  ?Date Time Provider [place]  9:00 AM KoerneIgnacia BayleyMEDONC None  ?11/27/2021 10:00 AM [facility] None  ?05/16/2022 10:00 AM Skarlette Lattner WTownsend RogerAAM-GAAIM None  ?10/22/2022  9:30 AM [name], IbtehaMelanie CrazierBPC-LBENDO None  ? ? ?HPI ?Patient presents for a complete physical.  ?65 y.o67male following up on HTN, HLD, DMII, GERD, weight and vitamin D defciency. ? ?Pituitary adenoma has spiked to the side and he is to have appointment at Neurosurgery. Occasional blurred vision and has appointment with eye doctor 11/2021 ? ?His blood pressure has not monitored at home, today their BP is BP: 138/72  ?BP Readings from Last 3 Encounters:  ?10/31/21 138/72  ?10/25/21 138/80  ?05/16/21 130/70  ?  ?BMI is Body mass index is 31.55 kg/m?., he has not been working on diet and exercise. ?Wt Readings from Last 3 Encounters:  ?10/31/21 232 lb  9.6 oz (105.5 kg)  ?10/25/21 234 lb (106.1 kg)  ?05/16/21 229 lb 3.2 oz (104 kg)  ? ? ?He does not workout, but stays active,working for [employer], works 3+ days a week,  lots of walking, retired Psychologist, occupational. Avg 5-7 miles a day when works ? He denies chest pain, shortness of breath, dizziness.  ?He is on celexa for anxiety ? ?He is not taking any medication for cholesterol currently.He previously was on Rosuvastatin but made his knees hurt. His cholesterol is not at goal less than 70. The cholesterol last visit was:   ?Lab Results  ?Component Value Date  ? CHOL 238 (H) 05/16/2021  ? HDL 32 (L) 05/16/2021  ? LDLCALC 161 (H) 05/16/2021  ? TRIG 274 (H) 05/16/2021  ? CHOLHDL 7.4 (H) 05/16/2021  ? ? ?He is checking his blood glucose 3-5 times a day and as needed. ?He has been working on diet and exercise for diabetes ? with CKD is on ACE/ARB ?With hyperlipidemia he iwas on crestor 28m and zetia 143m ?he stopped his synjardy due to severe diarrhea and is only taking Ozempic ? he is on bASA ?Has not been checking blood sugars ?Eye Exam: he has appointment scheduled in May ?and denies paresthesia of the feet, polydipsia, polyuria and visual disturbances.  ?Last A1C in the office was:  ?Lab Results  ?Component Value Date  ? HGBA1C 5.6 05/16/2021  ? ?Lab Results  ?Component Value Date  ? GFRNONAA >60 03/06/2021  ? ?Lab Results  ?Component Value Date  ? CHOL 238 (H) 05/16/2021  ? HDL 32 (L) 05/16/2021  ? LDLCALC 161 (H) 05/16/2021  ? TRIG 274 (H) 05/16/2021  ? CHOLHDL 7.4 (H) 05/16/2021  ? ? ?Patient is on Vitamin D supplement, not on vitamin D.   ?Lab Results  ?Component Value Date  ? VD25OH 26.63 (L) 10/25/2021  ?   ?Last PSA was: ?Lab Results  ?Component Value Date  ? PSA 0.11 05/16/2021  ? ? ? ? ?Current Medications:  ?Current Outpatient Medications on File Prior to Visit  ?Medication Sig Dispense Refill  ? aspirin 81 MG tablet Take 81 mg by mouth daily.    ? Cholecalciferol 125 MCG (5000 UT) capsule Take 5,000 Units by mouth daily.    ? citalopram (CELEXA) 40 MG tablet Take 1 tablet (40 mg total) by mouth daily. 90 tablet 2  ? clonazePAM (KLONOPIN) 1 MG tablet TAKE 1 TABLET BY MOUTH  NIGHTLY FOR RESTLESS LEGS 90 tablet 0  ? diclofenac (VOLTAREN) 75 MG EC tablet Take 1 tablet (75 mg total) by mouth 2 (two) times daily between meals as needed. 60 tablet 2  ? gabapentin (NEURONTIN) 300 MG capsule Take 3 capsules (900 mg total) by mouth every night for chronic pain or sleep 270 capsule 1  ? losartan (COZAAR) 50 MG tablet Take 1 tablet (50 mg total) by mouth daily. 30 tablet 11  ? Omega-3 Fatty Acids (FISH OIL) 600 MG CAPS Take by mouth.    ? omeprazole (PRILOSEC) 20 MG capsule Take 1 capsule (20 mg total) by mouth daily. 90 capsule 2  ? Semaglutide, 1 MG/DOSE, (OZEMPIC, 1 MG/DOSE,) 4 MG/3ML SOPN Inject 1 mg into the skin once a week. 3 mL 2  ? TURMERIC PO Take 1 capsule by mouth 2 (two) times daily.     ? blood glucose meter kit and supplies Test sugars once dailyDispense based insurance preference. E11.9 1 each 0  ? CINNAMON PO Take by mouth. (Patient not  taking: Reported on 10/31/2021)    ? colesevelam (WELCHOL) 625 MG tablet Take 1 tablet (625 mg total) by mouth 2 (two) times daily with a meal. (Patient not taking: Reported on 10/31/2021) 60 tablet 2  ? Empagliflozin-metFORMIN HCl ER (SYNJARDY XR) 25-1000 MG TB24 TAKE 1 TABLET BY MOUTH ONCE DAILY. (Patient not taking: Reported on 10/31/2021) 90 tablet 1  ? glucose blood (FREESTYLE LITE) test strip Check  Blood Sugar  2 times daily 200 strip 3  ? Lancets (FREESTYLE) lancets USE TO TEST BLOOD GLUCOSE ONCE A DAY 100 each 0  ? ?No current facility-administered medications on file prior to visit.  ? ?Health Maintenance:  ?Immunization History  ?Administered Date(s) Administered  ? Influenza-Unspecified 05/07/2013, 04/10/2021  ? Pneumococcal Polysaccharide-23 06/24/2013  ? Td 07/09/2006  ? ?Tetanus: 2016 at work ?Pneumovax: 2014 ?Prevnar 13: ?Flu vaccine: at work 2021 ?Zostavax/Shingrix: Discussed with patient ?DEXA: ?Colonoscopy: 05/2018 ?ERCP 08/2018 ?EGD: ?Sleep study 2017 ?Eye Exam: [facility] 2021 ?Dentist: Dentures, fit well. ? ?Patient Care  Team: ?Unk Pinto, MD as PCP - General (Internal Medicine) ?Carol [place], MD as Consulting Physician (Gastroenterology)  ?Dr. Johnsie Cancel 2007 normal stress test ?Dr. Kellie Simmering ?Dr. Benson Norway ?Dr. Lorin Mercy  ?Dr. Kristian Covey ?

## 2021-10-31 NOTE — Telephone Encounter (Signed)
Please refill Diclofenac. Pharm states they do not have it. Last sent was in [place] with 2 refills. ? ? ?[facility] pharm. ? ?

## 2021-11-01 ENCOUNTER — Ambulatory Visit: Payer: 59 | Admitting: Nurse Practitioner

## 2021-11-01 ENCOUNTER — Other Ambulatory Visit: Payer: Self-pay | Admitting: Nurse Practitioner

## 2021-11-01 ENCOUNTER — Other Ambulatory Visit (HOSPITAL_COMMUNITY): Payer: Self-pay

## 2021-11-01 DIAGNOSIS — E0822 Diabetes mellitus due to underlying condition with diabetic chronic kidney disease: Secondary | ICD-10-CM

## 2021-11-01 DIAGNOSIS — E1169 Type 2 diabetes mellitus with other specified complication: Secondary | ICD-10-CM

## 2021-11-01 LAB — CBC WITH DIFFERENTIAL/PLATELET
Absolute Monocytes: 311 cells/uL (ref 200–950)
Basophils Absolute: 41 cells/uL (ref 0–200)
Basophils Relative: 0.8 %
Eosinophils Absolute: 270 cells/uL (ref 15–500)
Eosinophils Relative: 5.3 %
HCT: 39.1 % (ref 38.5–50.0)
Hemoglobin: 13.2 g/dL (ref 13.2–17.1)
Lymphs Abs: 2474 cells/uL (ref 850–3900)
MCH: 29.1 pg (ref 27.0–33.0)
MCHC: 33.8 g/dL (ref 32.0–36.0)
MCV: 86.3 fL (ref 80.0–100.0)
MPV: 9.9 fL (ref 7.5–12.5)
Monocytes Relative: 6.1 %
Neutro Abs: 2004 cells/uL (ref 1500–7800)
Neutrophils Relative %: 39.3 %
Platelets: 210 10*3/uL (ref 140–400)
RBC: 4.53 10*6/uL (ref 4.20–5.80)
RDW: 13.3 % (ref 11.0–15.0)
Total Lymphocyte: 48.5 %
WBC: 5.1 10*3/uL (ref 3.8–10.8)

## 2021-11-01 LAB — COMPLETE METABOLIC PANEL WITH GFR
AG Ratio: 1.6 (calc) (ref 1.0–2.5)
ALT: 24 U/L (ref 9–46)
AST: 19 U/L (ref 10–35)
Albumin: 4.4 g/dL (ref 3.6–5.1)
Alkaline phosphatase (APISO): 49 U/L (ref 35–144)
BUN: 19 mg/dL (ref 7–25)
CO2: 29 mmol/L (ref 20–32)
Calcium: 9.4 mg/dL (ref 8.6–10.3)
Chloride: 103 mmol/L (ref 98–110)
Creat: 1.23 mg/dL (ref 0.70–1.35)
Globulin: 2.8 g/dL (calc) (ref 1.9–3.7)
Glucose, Bld: 106 mg/dL — ABNORMAL HIGH (ref 65–99)
Potassium: 4.7 mmol/L (ref 3.5–5.3)
Sodium: 139 mmol/L (ref 135–146)
Total Bilirubin: 0.8 mg/dL (ref 0.2–1.2)
Total Protein: 7.2 g/dL (ref 6.1–8.1)
eGFR: 65 mL/min/{1.73_m2} (ref >=60)

## 2021-11-01 LAB — HEMOGLOBIN A1C
Hgb A1c MFr Bld: 6 % of total Hgb — ABNORMAL HIGH (ref <5.7)
Mean Plasma Glucose: 126 mg/dL
eAG (mmol/L): 7 mmol/L

## 2021-11-01 LAB — LIPID PANEL
Cholesterol: 237 mg/dL — ABNORMAL HIGH (ref <200)
HDL: 30 mg/dL — ABNORMAL LOW (ref >=40)
LDL Cholesterol (Calc): 170 mg/dL (calc) — ABNORMAL HIGH
Non-HDL Cholesterol (Calc): 207 mg/dL (calc) — ABNORMAL HIGH (ref <130)
Total CHOL/HDL Ratio: 7.9 (calc) — ABNORMAL HIGH (ref <5.0)
Triglycerides: 221 mg/dL — ABNORMAL HIGH (ref <150)

## 2021-11-01 LAB — PROLACTIN: Prolactin: 5.5 ng/mL (ref 2.0–18.0)

## 2021-11-01 LAB — TSH: TSH: 3.61 mIU/L (ref 0.40–4.50)

## 2021-11-01 LAB — INSULIN-LIKE GROWTH FACTOR
IGF-I, LC/MS: 125 ng/mL (ref 41–279)
Z-Score (Male): 0.1 SD (ref ?–2.0)

## 2021-11-01 LAB — PARATHYROID HORMONE, INTACT (NO CA): PTH: 28 pg/mL (ref 16–77)

## 2021-11-01 MED ORDER — EZETIMIBE 10 MG PO TABS
10.0000 mg | ORAL_TABLET | Freq: Every day | ORAL | 11 refills | Status: DC
  Filled 2021-11-01: qty 30, 30d supply, fill #0
  Filled 2021-11-01: qty 90, 90d supply, fill #0

## 2021-11-01 MED ORDER — OZEMPIC (2 MG/DOSE) 8 MG/3ML ~~LOC~~ SOPN
2.0000 mg | PEN_INJECTOR | SUBCUTANEOUS | 3 refills | Status: DC
  Filled 2021-11-01: qty 9, 84d supply, fill #0
  Filled 2021-11-01: qty 3, 28d supply, fill #0
  Filled 2022-01-26 – 2022-02-09 (×2): qty 3, 28d supply, fill #1

## 2021-11-02 ENCOUNTER — Other Ambulatory Visit (HOSPITAL_COMMUNITY): Payer: Self-pay

## 2021-11-20 ENCOUNTER — Encounter: Payer: Self-pay | Admitting: Nurse Practitioner

## 2021-11-27 ENCOUNTER — Inpatient Hospital Stay: Payer: 59 | Attending: Genetic Counselor | Admitting: Genetic Counselor

## 2021-11-27 ENCOUNTER — Inpatient Hospital Stay: Payer: 59

## 2021-11-27 ENCOUNTER — Other Ambulatory Visit: Payer: Self-pay

## 2021-11-27 ENCOUNTER — Other Ambulatory Visit: Payer: Self-pay | Admitting: Genetic Counselor

## 2021-11-27 DIAGNOSIS — Z85828 Personal history of other malignant neoplasm of skin: Secondary | ICD-10-CM

## 2021-11-27 DIAGNOSIS — D352 Benign neoplasm of pituitary gland: Secondary | ICD-10-CM

## 2021-11-27 DIAGNOSIS — Z8481 Family history of carrier of genetic disease: Secondary | ICD-10-CM | POA: Diagnosis not present

## 2021-11-27 DIAGNOSIS — Z8341 Family history of multiple endocrine neoplasia [MEN] syndrome: Secondary | ICD-10-CM

## 2021-11-27 LAB — GENETIC SCREENING ORDER

## 2021-11-28 ENCOUNTER — Encounter: Payer: Self-pay | Admitting: Genetic Counselor

## 2021-11-28 NOTE — Progress Notes (Signed)
REFERRING PROVIDER: [name], Melanie Crazier, MD [place],  [place] [address]  PRIMARY PROVIDER:  Unk Pinto, MD  PRIMARY REASON FOR VISIT:  1. Family history of type 1 MEN   2. Pituitary microadenoma ([place])   3. History of SCC (squamous cell carcinoma) of skin     HISTORY OF PRESENT ILLNESS:   Glenn Stephens, a 66 y.o. male, was seen for a [place] cancer genetics consultation at the request of Dr. Kelton Pillar due to a family history of a known hereditary cancer syndrome and the presence of a pituitary adenoma.  Glenn Stephens presents to clinic today to discuss the possibility of a hereditary predisposition to cancer, to discuss genetic testing, and to further clarify his future cancer risks, as well as potential cancer risks for family members.   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  He also reports a history of more than 50 lifetime basal cell carcinomas and squamous cell carcinomas beginning his his 30s-40s.   He does not have a known history of hyperparathyroidism.  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   Past Medical History:  Diagnosis Date   Acute calculous cholecystitis s/p lap cholecystectopmy 05/23/2018 05/22/2018   Cancer ([place])    skin cancer - basal cell   Diabetes mellitus without complication (HCC)    Full dentures    GERD (gastroesophageal reflux disease)    Heart murmur    no problems per pt   Hernia, abdominal    Hyperlipidemia    Hypertension    OSA (obstructive sleep apnea) 08/03/2013   pt does not use CPAP   Pneumonia    as a small child only   Superficial thrombophlebitis of left upper extremity 06/18/2018   Unspecified vitamin D deficiency    Wears glasses     Past Surgical History:   Procedure Laterality Date   BIOPSY  07/23/2018   Procedure: BIOPSY;  Surgeon: Irving Copas., MD;  Location: [facility];  Service: Gastroenterology;;   BIOPSY  08/13/2018   Procedure: BIOPSY;  Surgeon: Irving Copas., MD;  [facility];  Service: Gastroenterology;;   CHOLECYSTECTOMY N/A 05/23/2018   Procedure: LAPAROSCOPIC CHOLECYSTECTOMY;  Surgeon: Clovis Riley, MD;  Location: [place];  Service: General;  Laterality: N/A;   ENDOSCOPIC RETROGRADE CHOLANGIOPANCREATOGRAPHY (ERCP) WITH PROPOFOL N/A 08/13/2018   Procedure: ENDOSCOPIC RETROGRADE CHOLANGIOPANCREATOGRAPHY (ERCP) WITH PROPOFOL;  Surgeon: Irving Copas., MD;  Location: [place];  Service: Gastroenterology;  Laterality: N/A;   ESOPHAGOGASTRODUODENOSCOPY (EGD) WITH PROPOFOL N/A 07/23/2018   Procedure: ESOPHAGOGASTRODUODENOSCOPY (EGD) WITH PROPOFOL;  Surgeon: Rush Landmark Telford Nab., MD;  Location: [place];  Service: Gastroenterology;  Laterality: N/A;   LASER ABLATION     veins   METATARSAL OSTEOTOMY WITH BUNIONECTOMY     MULTIPLE TOOTH EXTRACTIONS     orthopedic surgeries     knee,foot   REMOVAL OF STONES  08/13/2018   Procedure: REMOVAL OF STONES;  Surgeon: Rush Landmark Telford Nab., MD;  Location: [place];  Service: Gastroenterology;;   Joan Mayans  08/13/2018   Procedure: Joan Mayans;  Surgeon: Irving Copas., MD;  Location: [facility];  Service: Gastroenterology;;   UPPER ESOPHAGEAL ENDOSCOPIC ULTRASOUND (EUS) N/A 07/23/2018   Procedure: UPPER ESOPHAGEAL ENDOSCOPIC ULTRASOUND (EUS);  Surgeon: Rush Landmark Telford Nab., MD;  Location: [place];  Service: Gastroenterology;  Laterality: N/A;   VASECTOMY      FAMILY HISTORY:  We obtained a detailed, 4-generation family history.  Significant diagnoses are listed below: Family History  Problem Relation Age of Onset   Melanoma Mother 25   Multiple endocrine neoplasia Mother        MEN1   Multiple endocrine neoplasia  Sister        MEN1   Skin cancer Sister    Skin cancer Sister    Cancer Paternal Uncle        unknown type   Stomach cancer Maternal Grandfather    Multiple endocrine neoplasia Maternal Grandfather        MEN1   Liver cancer Cousin        paternal male cousin; d. 98s   Cancer Cousin        paternal male and male cousin; unknown type     Glenn Stephens reported that his sister, mother, and maternal grandfather all had multiple endocrine neoplasia type 1 (MEN1) several years ago.  He had limited information about the health indications that prompted testing in these relatives.   There is no reported Ashkenazi Jewish ancestry. There is no known consanguinity.  GENETIC COUNSELING ASSESSMENT: Glenn Stephens is a 66 y.o. male with a family history of MEN1.  We, therefore, discussed and recommended the following at today's visit.   DISCUSSION:  We discussed that 5 - 10% of cancer is hereditary.  We discussed that given his sister and mother have MEN1, there is a ~50% chance he also has MEN1.  We briefly reviewed the cancer/tumor risks and management associated with MEN1. We discussed that it is also appropriate to have genetic testing for other genes, given his extensive personal and family history of skin cancer and the presence of several unknown cancers in his paternal family. We discussed that testing is beneficial for several reasons, including knowing about other cancer risks, identifying potential screening and risk-reduction options that may be appropriate, and to understanding if other family members could be at risk for cancer and allowing them to undergo genetic testing.  We reviewed the characteristics, features and inheritance patterns of hereditary cancer syndromes. We also discussed genetic testing, including the appropriate family members to test, the process of testing, insurance coverage and turn-around-time for results. We discussed the implications of a negative, positive, carrier  and/or variant of uncertain significant result. We recommended Glenn Stephens pursue genetic testing for a panel that contains MEN1 as well as other genes.  The CancerNext-Expanded gene panel offered by [facility] and includes sequencing, rearrangement, and RNA analysis for the following 77 genes: AIP, ALK, APC, ATM, AXIN2, BAP1, BARD1, BLM, BMPR1A, BRCA1, BRCA2, BRIP1, CDC73, CDH1, CDK4, CDKN1B, CDKN2A, CHEK2, CTNNA1, DICER1, FANCC, FH, FLCN, GALNT12, KIF1B, LZTR1, MAX, MEN1, MET, MLH1, MSH2, MSH3, MSH6, MUTYH, NBN, NF1, NF2, NTHL1, PALB2, PHOX2B, PMS2, POT1, PRKAR1A, PTCH1, PTEN, RAD51C, RAD51D, RB1, RECQL, RET, SDHA, SDHAF2, SDHB, SDHC, SDHD, SMAD4, SMARCA4, SMARCB1, SMARCE1, STK11, SUFU, TMEM127, TP53, TSC1, TSC2, VHL and XRCC2 (sequencing and deletion/duplication); EGFR, EGLN1, HOXB13, KIT, MITF, PDGFRA, POLD1, and POLE (sequencing only); EPCAM and GREM1 (deletion/duplication only).   Based on Glenn Stephens's family history of a hereditary cancer syndrome, he meets medical criteria for genetic testing. Despite that he meets criteria, he may still have an out of pocket cost. We discussed that if his out of pocket cost for testing is over $100, the laboratory should contact him to discuss self-pay prices and/or patient pay assistance  programs.  We discussed that some people do not want to undergo genetic testing due to fear of genetic discrimination.  A federal law called the Genetic Information Non-Discrimination Act (GINA) of 2008 helps protect individuals against genetic discrimination based on their genetic test results.  It impacts both health insurance and employment.  With health insurance, it protects against increased premiums, being kicked off insurance or being forced to take a test in order to be insured.  For employment it protects against hiring, firing and promoting decisions based on genetic test results.  GINA does not apply to those in the [employer], those who work for companies with less  than 15 employees, and new life insurance or long-term disability insurance policies.  Health status due to a cancer diagnosis is not protected under GINA.  PLAN: After considering the risks, benefits, and limitations, Glenn Stephens provided informed consent to pursue genetic testing and the blood sample was sent to [facility] for analysis of the CancerNext-Expanded +RNAinsight panel. Results should be available within approximately 3 weeks' time, at which point they will be disclosed by telephone to Glenn Stephens, as will any additional recommendations warranted by these results.  Glenn Stephens requested that results be disclosed to wife, Glenn Stephens. Glenn Stephens will receive a summary of his genetic counseling visit and a copy of his results once available. This information will also be available in Epic.   Lastly, we encouraged Glenn Stephens to remain in contact with cancer genetics annually so that we can continuously update the family history and inform him of any changes in cancer genetics and testing that may be of benefit for this family.   Glenn Stephens questions were answered to his satisfaction today. Our contact information was provided should additional questions or concerns arise. Thank you for the referral and allowing [place] to share in the care of your patient.   Glenn Stephens, [place], [facility] Genetic Counselor [email or web address] (P) [phone]   The patient was seen for a total of 40 minutes in face-to-face genetic counseling.  Patient was accompanied by his wife, Glenn Stephens. Drs. Lindi Adie and/or Burr Medico were available to discuss this case as needed.  _______________________________________________________________________ For Office Staff:  Number of people involved in session: 2 Was an Intern/ student involved with case: no

## 2021-12-01 DIAGNOSIS — D352 Benign neoplasm of pituitary gland: Secondary | ICD-10-CM | POA: Diagnosis not present

## 2021-12-01 DIAGNOSIS — H5203 Hypermetropia, bilateral: Secondary | ICD-10-CM | POA: Diagnosis not present

## 2021-12-01 LAB — HM DIABETES EYE EXAM

## 2021-12-08 DIAGNOSIS — D352 Benign neoplasm of pituitary gland: Secondary | ICD-10-CM | POA: Diagnosis not present

## 2021-12-08 LAB — HM DIABETES EYE EXAM

## 2021-12-14 ENCOUNTER — Other Ambulatory Visit: Payer: Self-pay | Admitting: Nurse Practitioner

## 2021-12-14 ENCOUNTER — Other Ambulatory Visit (HOSPITAL_COMMUNITY): Payer: Self-pay

## 2021-12-14 MED ORDER — GABAPENTIN 300 MG PO CAPS
900.0000 mg | ORAL_CAPSULE | Freq: Every evening | ORAL | 1 refills | Status: DC
  Filled 2021-12-14: qty 270, 90d supply, fill #0
  Filled 2022-03-07 – 2022-03-11 (×3): qty 270, 90d supply, fill #1

## 2021-12-14 MED FILL — Citalopram Hydrobromide Tab 40 MG (Base Equiv): ORAL | 90 days supply | Qty: 90 | Fill #1 | Status: AC

## 2021-12-14 MED FILL — Omeprazole Cap Delayed Release 20 MG: ORAL | 90 days supply | Qty: 90 | Fill #2 | Status: AC

## 2021-12-18 ENCOUNTER — Encounter: Payer: Self-pay | Admitting: Internal Medicine

## 2021-12-21 ENCOUNTER — Telehealth: Payer: Self-pay | Admitting: Genetic Counselor

## 2021-12-21 ENCOUNTER — Ambulatory Visit: Payer: Self-pay | Admitting: Genetic Counselor

## 2021-12-21 ENCOUNTER — Encounter: Payer: Self-pay | Admitting: Genetic Counselor

## 2021-12-21 DIAGNOSIS — Z8341 Family history of multiple endocrine neoplasia [MEN] syndrome: Secondary | ICD-10-CM

## 2021-12-21 DIAGNOSIS — Z1379 Encounter for other screening for genetic and chromosomal anomalies: Secondary | ICD-10-CM | POA: Insufficient documentation

## 2021-12-21 DIAGNOSIS — D352 Benign neoplasm of pituitary gland: Secondary | ICD-10-CM

## 2021-12-21 NOTE — Telephone Encounter (Signed)
Contacted patient's wife, Judeen Hammans, (per pt request) in attempt to disclose results of genetic testing.  LVM with contact information requesting a call back.

## 2021-12-22 DIAGNOSIS — D352 Benign neoplasm of pituitary gland: Secondary | ICD-10-CM | POA: Diagnosis not present

## 2021-12-22 DIAGNOSIS — Z6828 Body mass index (BMI) 28.0-28.9, adult: Secondary | ICD-10-CM | POA: Diagnosis not present

## 2021-12-26 NOTE — Progress Notes (Signed)
HPI:   Glenn Stephens was previously seen in the [place] clinic due to a personal history of a pituitary adenoma, a family history of an MEN1 mutation, and concerns regarding a hereditary predisposition to cancer. Please refer to our prior cancer genetics clinic note for more information regarding our discussion, assessment and recommendations, at the time. Glenn Stephens recent genetic test results were disclosed to him, as were recommendations warranted by these results. These results and recommendations are discussed in more detail below.  CANCER HISTORY:    In 2022, at the age of 22, Glenn Stephens was diagnosed with pituitary microadenoma.  He also reports a history of more than 50 lifetime basal cell carcinomas and squamous cell carcinomas beginning his his 30s-40s.    He does not have a known history of hyperparathyroidism.  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     FAMILY HISTORY:  We obtained a detailed, 4-generation family history.  Significant diagnoses are listed below:      Family History  Problem Relation Age of Onset   Melanoma Mother 4   Multiple endocrine neoplasia Mother          MEN1   Multiple endocrine neoplasia Sister          MEN1   Skin cancer Sister     Skin cancer Sister     Cancer Paternal Uncle          unknown type   Stomach cancer Maternal Grandfather     Multiple endocrine neoplasia Maternal Grandfather          MEN1   Liver cancer Cousin          paternal male cousin; d. 37s   Cancer Cousin          paternal male and male cousin; unknown type       Glenn Stephens reported that his sister, mother, and maternal grandfather all had multiple endocrine neoplasia type 1 (MEN1) several years ago.  He had limited information about the health  indications that prompted testing in these relatives.    There is no reported Ashkenazi Jewish ancestry. There is no known consanguinity.   GENETIC TEST RESULTS:  The [facility] CancerNext-Expanded+RNAinsight Panel found no pathogenic mutations.   The CancerNext-Expanded gene panel offered by [facility] and includes sequencing, rearrangement, and RNA analysis for the following 77 genes: AIP, ALK, APC, ATM, AXIN2, BAP1, BARD1, BLM, BMPR1A, BRCA1, BRCA2, BRIP1, CDC73, CDH1, CDK4, CDKN1B, CDKN2A, CHEK2, CTNNA1, DICER1, FANCC, FH, FLCN, GALNT12, KIF1B, LZTR1, MAX, MEN1, MET, MLH1, MSH2, MSH3, MSH6, MUTYH, NBN, NF1, NF2, NTHL1, PALB2, PHOX2B, PMS2, POT1, PRKAR1A, PTCH1, PTEN, RAD51C, RAD51D, RB1, RECQL, RET, SDHA, SDHAF2, SDHB, SDHC, SDHD, SMAD4, SMARCA4, SMARCB1, SMARCE1, STK11, SUFU, TMEM127, TP53, TSC1, TSC2, VHL and XRCC2 (sequencing and deletion/duplication); EGFR, EGLN1, HOXB13, KIT, MITF, PDGFRA, POLD1, and POLE (sequencing only); EPCAM and GREM1 (deletion/duplication only).   The test report has been scanned into EPIC and is located under the Molecular Pathology section of the Results Review tab.  A portion of the result report is included below for reference. Genetic testing reported out on December 20, 2021.         We recommended Glenn Stephens pursue testing for the reported familial hereditary cancer gene mutation in the MEN1 gene. Glenn Stephens test was normal and did not reveal  the familial mutation. We call this result a true negative result because the cancer-causing mutation was identified in Glenn Stephens's family, and he did not inherit it.  Given this negative result, Glenn Stephens chances of developing MEN1-related tumors are about the same as they are in the general population.    Of note, a copy of the family genetics report was not available at the time of testing.  Glenn Stephens knows that if he is able to obtain the report, the genetics labs can re-analyze data to ensure that test would  have been able to detect family mutation in sample if present.   ADDITIONAL GENETIC TESTING:  We discussed with Glenn Stephens that his genetic testing was fairly extensive.  If there are additional relevant genes identified to increase cancer risk that can be analyzed in the future, we would be happy to discuss and coordinate this testing at that time.     CANCER SCREENING RECOMMENDATIONS:  Glenn Stephens test result is considered negative (normal).  This means that we have not identified a hereditary cause for his personal history of pituitary adenoma at this time.  The reported cause of MEN1 in his family was not identified in his test result.    An individual's cancer risk and medical management are not determined by genetic test results alone. Overall cancer risk assessment incorporates additional factors, including personal medical history, family history, and any available genetic information that may result in a personalized plan for cancer prevention and surveillance. Therefore, it is recommended he continue to follow the cancer management and screening guidelines provided by his endocrinology and primary healthcare providers.   RECOMMENDATIONS FOR FAMILY MEMBERS:   Since he did not inherit a identifiable mutation in a cancer predisposition gene included on this panel, his son could not have inherited a known mutation from him in one of these genes. We recommend others in the family have testing for the reported family MEN1 mutation--including his other siblings and his affected sister's children.   FOLLOW-UP:  Lastly, we discussed with Glenn Stephens that cancer genetics is a rapidly advancing field and it is possible that new genetic tests will be appropriate for him and/or his family members in the future. We encouraged him to remain in contact with cancer genetics on an annual basis so we can update his personal and family histories and let him know of advances in cancer genetics that may  benefit this family.   Our contact number was provided. Glenn Stephens questions were answered to his satisfaction, and he knows he is welcome to call us at anytime with additional questions or concerns.   Cari M. Joette Catching, [place], [facility] Genetic Counselor [email or web address] (P) [phone]

## 2022-01-26 ENCOUNTER — Other Ambulatory Visit (HOSPITAL_COMMUNITY): Payer: Self-pay

## 2022-01-26 ENCOUNTER — Other Ambulatory Visit: Payer: Self-pay | Admitting: Nurse Practitioner

## 2022-01-26 ENCOUNTER — Other Ambulatory Visit: Payer: Self-pay | Admitting: Physician Assistant

## 2022-01-26 DIAGNOSIS — R251 Tremor, unspecified: Secondary | ICD-10-CM

## 2022-01-27 MED ORDER — CLONAZEPAM 1 MG PO TABS
1.0000 mg | ORAL_TABLET | Freq: Every evening | ORAL | 0 refills | Status: DC
Start: 2022-01-27 — End: 2022-05-01
  Filled 2022-01-27: qty 90, 90d supply, fill #0

## 2022-01-29 ENCOUNTER — Other Ambulatory Visit (HOSPITAL_COMMUNITY): Payer: Self-pay

## 2022-01-30 ENCOUNTER — Encounter: Payer: Self-pay | Admitting: Internal Medicine

## 2022-01-31 ENCOUNTER — Other Ambulatory Visit (HOSPITAL_COMMUNITY): Payer: Self-pay

## 2022-02-01 ENCOUNTER — Other Ambulatory Visit (HOSPITAL_COMMUNITY): Payer: Self-pay

## 2022-02-01 ENCOUNTER — Other Ambulatory Visit: Payer: Self-pay | Admitting: Physician Assistant

## 2022-02-05 ENCOUNTER — Other Ambulatory Visit: Payer: Self-pay | Admitting: Physician Assistant

## 2022-02-05 ENCOUNTER — Other Ambulatory Visit (HOSPITAL_COMMUNITY): Payer: Self-pay

## 2022-02-06 ENCOUNTER — Other Ambulatory Visit (HOSPITAL_COMMUNITY): Payer: Self-pay

## 2022-02-07 ENCOUNTER — Other Ambulatory Visit: Payer: Self-pay | Admitting: Physician Assistant

## 2022-02-07 ENCOUNTER — Other Ambulatory Visit (HOSPITAL_COMMUNITY): Payer: Self-pay

## 2022-02-08 ENCOUNTER — Telehealth: Payer: Self-pay | Admitting: Physician Assistant

## 2022-02-08 ENCOUNTER — Other Ambulatory Visit (HOSPITAL_COMMUNITY): Payer: Self-pay

## 2022-02-08 NOTE — Telephone Encounter (Signed)
Patient called. He would like refill on Voltaren called in. His call back number is [phone]

## 2022-02-08 NOTE — Progress Notes (Signed)
 3 MONTH FOLLOW UP    Assessment and Plan:  Talmage was seen today for annual exam.  Diagnoses and all orders for this visit:  Type 2 diabetes mellitus with stage 2 chronic kidney disease, without long-term current use of insulin (HCC) Continue medications:Synjardy XR 25-1000 daily and metformin XR 537m BID. Ozempic 0.5 mg QW Discussed general issues about diabetes pathophysiology and management. Education: Reviewed 'ABCs' of diabetes management (respective goals in parentheses):  A1C (<7), blood pressure (<130/80), and cholesterol (LDL <70) Dietary recommendations Encouraged aerobic exercise.  Discussed foot care, check daily Yearly retinal exam- scheduled for May Dental exam every 6 months Monitor blood glucose, discussed goal for patient -     Hemoglobin A1c   Essential hypertension Continue current medications: Losartan 533mMonitor blood pressure at home; call if consistently over 130/80 Continue DASH diet.   Reminder to go to the ER if any CP, SOB, nausea, dizziness, severe HA, changes vision/speech, left arm numbness and tingling and jaw pain. -     CBC with Differential/Platelet -     COMPLETE METABOLIC PANEL WITH GFR   Hyperlipidemia, unspecified hyperlipidemia type/Statin myopathy Unable to take statins or Zetia due to myopathys Refer to lipid clinic for evaluation Discussed dietary and exercise modifications Low fat diet -     Lipid panel   Vitamin D deficiency Continue supplementation to maintain goal of 70-100 Taking Vitamin D5,000 IU daily   OSA on CPAP Continue CPAP/BiPAP, using nightly for at least 8 hours  Helping with daytime fatigue Weight loss still advised Discussed mask & tubing hygeine  Diabetic peripheral neuropathy associated with type 2 diabetes mellitus (HCPrairie du RocherDoing well at this time Checks feet daily Gabapentin 30042mGeneralized anxiety disorder Citalopram 76m64mnd clonazepam 1mg 37mping with RLS as well  Class 2 severe Obesity  with serious comorbidity Discussed dietary and exercise modifications   Primary insomnia Doing well Gabapentin 300mg 72mty  Medication management Continued  Vitamin D deficiency Continue Vit D supplementation   Pituitary Microadenoma Continue to follow with Dr. ShamleKelton Pillarvisual fields tested at ophthalmologist- has appt schedule for May Saw Dr. OstergZada Finderss to have another MRI in 6 months  Constipation Begin Fibercon pills, hasn't been taking metamucil    Discussed med's effects and SE's. Screening labs and tests as requested with regular follow-up as recommended. Over 40 minutes of face to face interview,  exam, counseling, chart review and critical decision making was performed  Future Appointments  Date Time Provider [place]/2023 10:00 AM WilkinAlycia RossettiAAM-[facility] None  10/22/2022  9:30 AM [name], IbtehaMelanie CrazierBPC-[facility]O None    HPI Patient presents for a complete physical.  66 y.o54male following up on HTN, HLD, DMII, GERD, weight and vitamin D defciency.  Pituitary adenoma has spiked to the side and he has seen Dr OstergZada Findersdvised it had shrunk, the spidering were not concerned.  Will have another MRI in 6 months. Occasional blurred vision and has appointment with eye doctor 11/2021. Continues to have some fuzziness in vision   His blood pressure has not monitored at home, today their BP is BP: 120/78  BP Readings from Last 3 Encounters:  02/09/22 120/78  10/31/21 138/72  10/25/21 138/80    BMI is Body mass index is 30.65 kg/m., he has not been working on diet and exercise. No other exercise than work.  Wt Readings from Last 3 Encounters:  02/09/22 226 lb (102.5 kg)  10/31/21 232 lb  9.6 oz (105.5 kg)  10/25/21 234 lb (106.1 kg)    He does not workout, but stays active,working for [employer], works 3+ days a week, lots of walking, retired Psychologist, occupational. Avg 5-7 miles a day when works  He denies chest pain,  shortness of breath, dizziness.  He is on celexa for anxiety  He is not taking any medication for cholesterol currently.He previously was on Rosuvastatin but made his knees hurt.  Tried Zetia and it hurt his joints.  His cholesterol is not at goal less than 70. The cholesterol last visit was:   Lab Results  Component Value Date   CHOL 237 (H) 10/31/2021   HDL 30 (L) 10/31/2021   LDLCALC 170 (H) 10/31/2021   TRIG 221 (H) 10/31/2021   CHOLHDL 7.9 (H) 10/31/2021     He has been working on diet and exercise for diabetes  with CKD is on ACE/ARB With hyperlipidemia he was on crestor 57m and zetia 173mbut has stopped both he stopped his synjardy due to severe diarrhea and is only taking Ozempic  he is on bASA Has not been checking blood sugars regularly. Blood sugar last check 108.  Eye Exam: Dr. GoDelman Cheadle/2/23 and denies paresthesia of the feet, polydipsia, polyuria and visual disturbances.  Last A1C in the office was:  Lab Results  Component Value Date   HGBA1C 6.0 (H) 10/31/2021   Lab Results  Component Value Date   GFRNONAA >60 03/06/2021   Lab Results  Component Value Date   CHOL 237 (H) 10/31/2021   HDL 30 (L) 10/31/2021   LDLCALC 170 (H) 10/31/2021   TRIG 221 (H) 10/31/2021   CHOLHDL 7.9 (H) 10/31/2021    Patient is on Vitamin D supplement, not on vitamin D.   Lab Results  Component Value Date   VD25OH 26.63 (L) 10/25/2021     Last PSA was: Lab Results  Component Value Date   PSA 0.11 05/16/2021      Current Medications:  Current Outpatient Medications on File Prior to Visit  Medication Sig Dispense Refill   aspirin 81 MG tablet Take 81 mg by mouth daily.     blood glucose meter kit and supplies Test sugars once dailyDispense based insurance preference. E11.9 1 each 0   citalopram (CELEXA) 40 MG tablet Take 1 tablet (40 mg total) by mouth daily. 90 tablet 2   clonazePAM (KLONOPIN) 1 MG tablet TAKE 1 TABLET BY MOUTH NIGHTLY FOR RESTLESS LEGS 90 tablet 0    gabapentin (NEURONTIN) 300 MG capsule Take 3 capsules (900 mg total) by mouth every night for chronic pain or sleep 270 capsule 1   glucose blood (FREESTYLE LITE) test strip Check  Blood Sugar  2 times daily 200 strip 3   Lancets (FREESTYLE) lancets USE TO TEST BLOOD GLUCOSE ONCE A DAY 100 each 0   losartan (COZAAR) 50 MG tablet Take 1 tablet (50 mg total) by mouth daily. 30 tablet 11   omeprazole (PRILOSEC) 20 MG capsule Take 1 capsule (20 mg total) by mouth daily. 90 capsule 2   Semaglutide, 2 MG/DOSE, (OZEMPIC, 2 MG/DOSE,) 8 MG/3ML SOPN Inject 2 mg into the skin once a week. 3 mL 3   TURMERIC PO Take 1 capsule by mouth 2 (two) times daily.      Cholecalciferol 125 MCG (5000 UT) capsule Take 5,000 Units by mouth daily. (Patient not taking: Reported on 02/09/2022)     ezetimibe (ZETIA) 10 MG tablet Take 1 tablet (10 mg total)  by mouth daily. (Patient not taking: Reported on 02/09/2022) 30 tablet 11   Omega-3 Fatty Acids (FISH OIL) 600 MG CAPS Take by mouth. (Patient not taking: Reported on 02/09/2022)     No current facility-administered medications on file prior to visit.   Health Maintenance:  Immunization History  Administered Date(s) Administered   Influenza-Unspecified 05/07/2013, 04/10/2021   Pneumococcal Polysaccharide-23 06/24/2013   Td 07/09/2006   Tetanus: 2016 at work Pneumovax: 2014 Prevnar 13: Flu vaccine: at work 2021 Zostavax/Shingrix: Discussed with patient DEXA: Colonoscopy: 05/2018 ERCP 08/2018 EGD: Sleep study 2017 Eye Exam: [facility] 2021 Dentist: Dentures, fit well.  Patient Care Team: Unk Pinto, MD as PCP - General (Internal Medicine) Carol Ada, MD as Consulting Physician (Gastroenterology)  Dr. Johnsie Cancel 2007 normal stress test Dr. Kellie Simmering Dr. Benson [place] Dr. Lorin Mercy  Dr. Kristian Covey  Medical History:  Past Medical History:  Diagnosis Date   Acute calculous cholecystitis s/p lap cholecystectopmy 05/23/2018 05/22/2018   Cancer ([place])    skin cancer -  basal cell   Diabetes mellitus without complication ([place])    Full dentures    GERD (gastroesophageal reflux disease)    Heart murmur    no problems per pt   Hernia, abdominal    Hyperlipidemia    Hypertension    OSA (obstructive sleep apnea) 08/03/2013   pt does not use CPAP   Pneumonia    as a small child only   Superficial thrombophlebitis of left upper extremity 06/18/2018   Unspecified vitamin D deficiency    Wears glasses    Allergies Allergies  Allergen Reactions   Caduet [Amlodipine-Atorvastatin] Hives and Itching   Statins     BLURRED VISION    SURGICAL HISTORY He  has a past surgical history that includes orthopedic surgeries; Laser ablation; Vasectomy; Metatarsal osteotomy with bunionectomy; Cholecystectomy (N/A, 05/23/2018); Multiple tooth extractions; Upper esophageal endoscopic ultrasound (eus) (N/A, 07/23/2018); biopsy (07/23/2018); Esophagogastroduodenoscopy (egd) with propofol (N/A, 07/23/2018); Endoscopic retrograde cholangiopancreatography (ercp) with propofol (N/A, 08/13/2018); biopsy (08/13/2018); sphincterotomy (08/13/2018); and removal of stones (08/13/2018). FAMILY HISTORY His family history includes Cancer in his cousin and paternal uncle; Diabetes in his sister; Heart disease in his father; Hyperlipidemia in his father; Hypertension in his brother; Liver cancer in his cousin; Melanoma (age of onset: 72) in his mother; Multiple endocrine neoplasia in his maternal grandfather, mother, and sister; Skin cancer in his sister and sister; Stomach cancer in his maternal grandfather. SOCIAL HISTORY He  reports that he has quit smoking. He has quit using smokeless tobacco.  His smokeless tobacco use included chew. He reports that he does not drink alcohol and does not use drugs.  Review of Systems:  Review of Systems  Constitutional: Negative.  Negative for chills and fever.  HENT:  Positive for tinnitus. Negative for congestion, ear discharge, ear pain, hearing loss,  nosebleeds, sinus pain and sore throat.   Eyes:  Positive for blurred vision (in morning but clears during the day). Negative for double vision.  Respiratory: Negative.  Negative for cough, hemoptysis, sputum production, shortness of breath, wheezing and stridor.   Cardiovascular:  Negative for chest pain, palpitations, orthopnea, claudication, leg swelling and PND.  Gastrointestinal:  Positive for constipation (uses metamucil). Negative for abdominal pain, diarrhea, heartburn, nausea and vomiting.  Genitourinary:  Negative for dysuria, frequency and urgency.       Gets up once at night to urinate  Musculoskeletal:  Positive for back pain and joint pain (knees). Negative for falls, myalgias and neck pain.  Skin:  Negative.  Negative for rash.  Neurological: Negative.  Negative for dizziness, tingling, tremors, weakness and headaches.  Endo/Heme/Allergies:  Does not bruise/bleed easily.  Psychiatric/Behavioral:  Negative for depression, hallucinations, memory loss, substance abuse and suicidal ideas. The patient has insomnia. The patient is not nervous/anxious.     Physical Exam: Estimated body mass index is 30.65 kg/m as calculated from the following:   Height as of this encounter: 6' (1.829 m).   Weight as of this encounter: 226 lb (102.5 kg). BP 120/78   Pulse 63   Temp (!) 97.2 F (36.2 C)   Ht 6' (1.829 m)   Wt 226 lb (102.5 kg)   SpO2 96%   BMI 30.65 kg/m  General Appearance: Well nourished, in no apparent distress.  Eyes: PERRLA, EOMs, conjunctiva no swelling or erythema, normal fundi and vessels.  Sinuses: No Frontal/maxillary tenderness  ENT/Mouth: Ext aud canals clear, normal light reflex with TMs without erythema, bulging. Good dentition. No erythema, swelling, or exudate on post pharynx. Tonsils not swollen or erythematous. Hearing normal.  Neck: Supple, thyroid normal. No bruits  Respiratory: Respiratory effort normal, BS equal bilaterally without rales, rhonchi, wheezing  or stridor.  Cardio: RRR without murmurs, rubs or gallops. Brisk peripheral pulses without edema.  Chest: symmetric, with normal excursions and percussion.  Abdomen: Soft, nontender, no guarding, rebound, hernias, masses, or organomegaly.  Lymphatics: Non tender without lymphadenopathy.  Musculoskeletal: Full ROM all peripheral extremities,5/5 strength, and normal gait. Bilateral varicose veins lower legs    Skin: Warm, dry without rashes, lesions, ecchymosis. Neuro: Cranial nerves intact, reflexes equal bilaterally. Normal muscle tone, no cerebellar symptoms. Sensation decreased bilateral feet to ankle. Psych: Awake and oriented X 3, normal affect, Insight and Judgment appropriate.       Gabreal Worton E  10:50 AM [facility]

## 2022-02-09 ENCOUNTER — Ambulatory Visit: Payer: 59 | Admitting: Nurse Practitioner

## 2022-02-09 ENCOUNTER — Other Ambulatory Visit: Payer: Self-pay | Admitting: Physician Assistant

## 2022-02-09 ENCOUNTER — Other Ambulatory Visit (HOSPITAL_COMMUNITY): Payer: Self-pay

## 2022-02-09 ENCOUNTER — Encounter: Payer: Self-pay | Admitting: Nurse Practitioner

## 2022-02-09 ENCOUNTER — Other Ambulatory Visit (HOSPITAL_BASED_OUTPATIENT_CLINIC_OR_DEPARTMENT_OTHER): Payer: Self-pay

## 2022-02-09 VITALS — BP 120/78 | HR 63 | Temp 97.2°F | Ht 72.0 in | Wt 226.0 lb

## 2022-02-09 DIAGNOSIS — F5101 Primary insomnia: Secondary | ICD-10-CM

## 2022-02-09 DIAGNOSIS — G4733 Obstructive sleep apnea (adult) (pediatric): Secondary | ICD-10-CM

## 2022-02-09 DIAGNOSIS — E1142 Type 2 diabetes mellitus with diabetic polyneuropathy: Secondary | ICD-10-CM | POA: Diagnosis not present

## 2022-02-09 DIAGNOSIS — Z79899 Other long term (current) drug therapy: Secondary | ICD-10-CM

## 2022-02-09 DIAGNOSIS — D352 Benign neoplasm of pituitary gland: Secondary | ICD-10-CM | POA: Diagnosis not present

## 2022-02-09 DIAGNOSIS — I10 Essential (primary) hypertension: Secondary | ICD-10-CM | POA: Diagnosis not present

## 2022-02-09 DIAGNOSIS — E559 Vitamin D deficiency, unspecified: Secondary | ICD-10-CM | POA: Diagnosis not present

## 2022-02-09 DIAGNOSIS — E1169 Type 2 diabetes mellitus with other specified complication: Secondary | ICD-10-CM

## 2022-02-09 DIAGNOSIS — E785 Hyperlipidemia, unspecified: Secondary | ICD-10-CM | POA: Diagnosis not present

## 2022-02-09 DIAGNOSIS — N182 Chronic kidney disease, stage 2 (mild): Secondary | ICD-10-CM

## 2022-02-09 DIAGNOSIS — F411 Generalized anxiety disorder: Secondary | ICD-10-CM

## 2022-02-09 DIAGNOSIS — Z9989 Dependence on other enabling machines and devices: Secondary | ICD-10-CM

## 2022-02-09 DIAGNOSIS — G720 Drug-induced myopathy: Secondary | ICD-10-CM

## 2022-02-09 DIAGNOSIS — E0822 Diabetes mellitus due to underlying condition with diabetic chronic kidney disease: Secondary | ICD-10-CM

## 2022-02-09 DIAGNOSIS — E66812 Obesity, class 2: Secondary | ICD-10-CM

## 2022-02-09 DIAGNOSIS — R911 Solitary pulmonary nodule: Secondary | ICD-10-CM

## 2022-02-09 MED ORDER — DICLOFENAC SODIUM 75 MG PO TBEC
75.0000 mg | DELAYED_RELEASE_TABLET | Freq: Two times a day (BID) | ORAL | 2 refills | Status: DC | PRN
  Filled 2022-02-09: qty 180, 90d supply, fill #0
  Filled 2022-02-09: qty 60, 30d supply, fill #0

## 2022-02-09 NOTE — Telephone Encounter (Signed)
sent 

## 2022-02-10 LAB — CBC WITH DIFFERENTIAL/PLATELET
Absolute Monocytes: 348 cells/uL (ref 200–950)
Basophils Absolute: 29 cells/uL (ref 0–200)
Basophils Relative: 0.5 %
Eosinophils Absolute: 348 cells/uL (ref 15–500)
Eosinophils Relative: 6 %
HCT: 37.2 % — ABNORMAL LOW (ref 38.5–50.0)
Hemoglobin: 12.5 g/dL — ABNORMAL LOW (ref 13.2–17.1)
Lymphs Abs: 2448 cells/uL (ref 850–3900)
MCH: 29.3 pg (ref 27.0–33.0)
MCHC: 33.6 g/dL (ref 32.0–36.0)
MCV: 87.3 fL (ref 80.0–100.0)
MPV: 9.9 fL (ref 7.5–12.5)
Monocytes Relative: 6 %
Neutro Abs: 2627 cells/uL (ref 1500–7800)
Neutrophils Relative %: 45.3 %
Platelets: 206 10*3/uL (ref 140–400)
RBC: 4.26 10*6/uL (ref 4.20–5.80)
RDW: 12.9 % (ref 11.0–15.0)
Total Lymphocyte: 42.2 %
WBC: 5.8 10*3/uL (ref 3.8–10.8)

## 2022-02-10 LAB — COMPLETE METABOLIC PANEL WITH GFR
AG Ratio: 1.7 (calc) (ref 1.0–2.5)
ALT: 21 U/L (ref 9–46)
AST: 19 U/L (ref 10–35)
Albumin: 4.5 g/dL (ref 3.6–5.1)
Alkaline phosphatase (APISO): 54 U/L (ref 35–144)
BUN/Creatinine Ratio: 23 (calc) — ABNORMAL HIGH (ref 6–22)
BUN: 27 mg/dL — ABNORMAL HIGH (ref 7–25)
CO2: 29 mmol/L (ref 20–32)
Calcium: 9.4 mg/dL (ref 8.6–10.3)
Chloride: 103 mmol/L (ref 98–110)
Creat: 1.17 mg/dL (ref 0.70–1.35)
Globulin: 2.7 g/dL (calc) (ref 1.9–3.7)
Glucose, Bld: 106 mg/dL — ABNORMAL HIGH (ref 65–99)
Potassium: 4.4 mmol/L (ref 3.5–5.3)
Sodium: 139 mmol/L (ref 135–146)
Total Bilirubin: 0.8 mg/dL (ref 0.2–1.2)
Total Protein: 7.2 g/dL (ref 6.1–8.1)
eGFR: 68 mL/min/{1.73_m2} (ref >=60)

## 2022-02-10 LAB — LIPID PANEL
Cholesterol: 231 mg/dL — ABNORMAL HIGH (ref <200)
HDL: 30 mg/dL — ABNORMAL LOW (ref >=40)
LDL Cholesterol (Calc): 158 mg/dL (calc) — ABNORMAL HIGH
Non-HDL Cholesterol (Calc): 201 mg/dL (calc) — ABNORMAL HIGH (ref <130)
Total CHOL/HDL Ratio: 7.7 (calc) — ABNORMAL HIGH (ref <5.0)
Triglycerides: 261 mg/dL — ABNORMAL HIGH (ref <150)

## 2022-02-10 LAB — HEMOGLOBIN A1C
Hgb A1c MFr Bld: 5.4 % of total Hgb (ref <5.7)
Mean Plasma Glucose: 108 mg/dL
eAG (mmol/L): 6 mmol/L

## 2022-02-12 ENCOUNTER — Other Ambulatory Visit: Payer: Self-pay | Admitting: Nurse Practitioner

## 2022-02-12 ENCOUNTER — Encounter: Payer: Self-pay | Admitting: Nurse Practitioner

## 2022-02-12 DIAGNOSIS — R710 Precipitous drop in hematocrit: Secondary | ICD-10-CM

## 2022-02-22 ENCOUNTER — Other Ambulatory Visit: Payer: 59

## 2022-02-22 ENCOUNTER — Ambulatory Visit: Payer: 59

## 2022-02-22 DIAGNOSIS — R710 Precipitous drop in hematocrit: Secondary | ICD-10-CM | POA: Diagnosis not present

## 2022-02-22 LAB — CBC WITH DIFFERENTIAL/PLATELET
Absolute Monocytes: 230 cells/uL (ref 200–950)
Basophils Absolute: 30 cells/uL (ref 0–200)
Basophils Relative: 0.6 %
Eosinophils Absolute: 260 cells/uL (ref 15–500)
Eosinophils Relative: 5.2 %
HCT: 34 % — ABNORMAL LOW (ref 38.5–50.0)
Hemoglobin: 11.7 g/dL — ABNORMAL LOW (ref 13.2–17.1)
Lymphs Abs: 2370 cells/uL (ref 850–3900)
MCH: 30.2 pg (ref 27.0–33.0)
MCHC: 34.4 g/dL (ref 32.0–36.0)
MCV: 87.9 fL (ref 80.0–100.0)
MPV: 10 fL (ref 7.5–12.5)
Monocytes Relative: 4.6 %
Neutro Abs: 2110 cells/uL (ref 1500–7800)
Neutrophils Relative %: 42.2 %
Platelets: 170 10*3/uL (ref 140–400)
RBC: 3.87 10*6/uL — ABNORMAL LOW (ref 4.20–5.80)
RDW: 12.9 % (ref 11.0–15.0)
Total Lymphocyte: 47.4 %
WBC: 5 10*3/uL (ref 3.8–10.8)

## 2022-02-22 NOTE — Progress Notes (Signed)
The patient came in for repeat lab work today. He does not voice any new concerns or have any questions at today's nurse visit.

## 2022-02-23 ENCOUNTER — Encounter: Payer: Self-pay | Admitting: Nurse Practitioner

## 2022-02-23 ENCOUNTER — Other Ambulatory Visit: Payer: Self-pay | Admitting: Nurse Practitioner

## 2022-02-23 DIAGNOSIS — R710 Precipitous drop in hematocrit: Secondary | ICD-10-CM

## 2022-02-28 ENCOUNTER — Other Ambulatory Visit: Payer: Self-pay

## 2022-02-28 ENCOUNTER — Other Ambulatory Visit: Payer: Self-pay | Admitting: Nurse Practitioner

## 2022-02-28 DIAGNOSIS — R710 Precipitous drop in hematocrit: Secondary | ICD-10-CM

## 2022-02-28 LAB — POC HEMOCCULT BLD/STL (HOME/3-CARD/SCREEN)
Card #2 Fecal Occult Blod, POC: NEGATIVE
Card #3 Fecal Occult Blood, POC: NEGATIVE
Fecal Occult Blood, POC: NEGATIVE

## 2022-03-01 DIAGNOSIS — Z1211 Encounter for screening for malignant neoplasm of colon: Secondary | ICD-10-CM | POA: Diagnosis not present

## 2022-03-01 DIAGNOSIS — Z1212 Encounter for screening for malignant neoplasm of rectum: Secondary | ICD-10-CM | POA: Diagnosis not present

## 2022-03-03 ENCOUNTER — Other Ambulatory Visit: Payer: Self-pay | Admitting: Nurse Practitioner

## 2022-03-03 DIAGNOSIS — N182 Chronic kidney disease, stage 2 (mild): Secondary | ICD-10-CM

## 2022-03-05 ENCOUNTER — Other Ambulatory Visit (HOSPITAL_COMMUNITY): Payer: Self-pay

## 2022-03-05 MED ORDER — OZEMPIC (2 MG/DOSE) 8 MG/3ML ~~LOC~~ SOPN
2.0000 mg | PEN_INJECTOR | SUBCUTANEOUS | 3 refills | Status: DC
  Filled 2022-03-05: qty 9, 84d supply, fill #0
  Filled 2022-05-25: qty 3, 28d supply, fill #1

## 2022-03-06 ENCOUNTER — Other Ambulatory Visit: Payer: 59

## 2022-03-06 DIAGNOSIS — R710 Precipitous drop in hematocrit: Secondary | ICD-10-CM

## 2022-03-06 LAB — CBC WITH DIFFERENTIAL/PLATELET
Absolute Monocytes: 270 cells/uL (ref 200–950)
Basophils Absolute: 41 cells/uL (ref 0–200)
Basophils Relative: 0.8 %
Eosinophils Absolute: 403 cells/uL (ref 15–500)
Eosinophils Relative: 7.9 %
HCT: 34.9 % — ABNORMAL LOW (ref 38.5–50.0)
Hemoglobin: 12.1 g/dL — ABNORMAL LOW (ref 13.2–17.1)
Lymphs Abs: 2423 cells/uL (ref 850–3900)
MCH: 30.2 pg (ref 27.0–33.0)
MCHC: 34.7 g/dL (ref 32.0–36.0)
MCV: 87 fL (ref 80.0–100.0)
MPV: 9.9 fL (ref 7.5–12.5)
Monocytes Relative: 5.3 %
Neutro Abs: 1964 cells/uL (ref 1500–7800)
Neutrophils Relative %: 38.5 %
Platelets: 170 10*3/uL (ref 140–400)
RBC: 4.01 10*6/uL — ABNORMAL LOW (ref 4.20–5.80)
RDW: 12.9 % (ref 11.0–15.0)
Total Lymphocyte: 47.5 %
WBC: 5.1 10*3/uL (ref 3.8–10.8)

## 2022-03-07 ENCOUNTER — Other Ambulatory Visit (HOSPITAL_COMMUNITY): Payer: Self-pay

## 2022-03-07 MED FILL — Citalopram Hydrobromide Tab 40 MG (Base Equiv): ORAL | 90 days supply | Qty: 90 | Fill #2 | Status: AC

## 2022-03-08 ENCOUNTER — Other Ambulatory Visit (HOSPITAL_COMMUNITY): Payer: Self-pay

## 2022-03-11 ENCOUNTER — Other Ambulatory Visit: Payer: Self-pay

## 2022-03-13 ENCOUNTER — Other Ambulatory Visit (HOSPITAL_COMMUNITY): Payer: Self-pay

## 2022-04-02 ENCOUNTER — Other Ambulatory Visit (HOSPITAL_COMMUNITY): Payer: Self-pay

## 2022-04-02 ENCOUNTER — Other Ambulatory Visit: Payer: Self-pay | Admitting: Nurse Practitioner

## 2022-04-02 MED ORDER — OMEPRAZOLE 20 MG PO CPDR
20.0000 mg | DELAYED_RELEASE_CAPSULE | Freq: Every day | ORAL | 2 refills | Status: DC
  Filled 2022-04-02: qty 90, 90d supply, fill #0
  Filled 2022-06-26: qty 90, 90d supply, fill #1
  Filled 2022-09-24: qty 90, 90d supply, fill #2

## 2022-05-01 ENCOUNTER — Encounter: Payer: Self-pay | Admitting: Nurse Practitioner

## 2022-05-01 ENCOUNTER — Other Ambulatory Visit: Payer: Self-pay | Admitting: Nurse Practitioner

## 2022-05-01 ENCOUNTER — Other Ambulatory Visit (HOSPITAL_COMMUNITY): Payer: Self-pay

## 2022-05-01 DIAGNOSIS — R251 Tremor, unspecified: Secondary | ICD-10-CM

## 2022-05-01 MED ORDER — CLONAZEPAM 1 MG PO TABS
1.0000 mg | ORAL_TABLET | Freq: Every evening | ORAL | 0 refills | Status: DC
  Filled 2022-05-01: qty 90, 90d supply, fill #0

## 2022-05-01 NOTE — Progress Notes (Unsigned)
Assessment and Plan:  There are no diagnoses linked to this encounter.    Further disposition pending results of labs. Discussed med's effects and SE's.   Over 30 minutes of exam, counseling, chart review, and critical decision making was performed.   Future Appointments  Date Time Provider Department Center  05/02/2022  9:15 AM Wilkinson, Dana E, NP [facility] None  05/16/2022 10:00 AM Wilkinson, Dana E, NP [facility] None  07/16/2022  1:45 PM Hilty, Kenneth C, MD [facility] None  10/22/2022  9:30 AM [name], Ibtehal Jaralla, MD [facility] None    ------------------------------------------------------------------------------------------------------------------   HPI There were no vitals taken for this visit. 66 y.o.male presents for  Past Medical History:  Diagnosis Date   Acute calculous cholecystitis s/p lap cholecystectopmy 05/23/2018 05/22/2018   Cancer (HCC)    skin cancer - basal cell   Diabetes mellitus without complication (HCC)    Full dentures    GERD (gastroesophageal reflux disease)    Heart murmur    no problems per pt   Hernia, abdominal    Hyperlipidemia    Hypertension    OSA (obstructive sleep apnea) 08/03/2013   pt does not use CPAP   Pneumonia    as a small child only   Superficial thrombophlebitis of left upper extremity 06/18/2018   Unspecified vitamin D deficiency    Wears glasses      Allergies  Allergen Reactions   Caduet [Amlodipine-Atorvastatin] Hives and Itching   Statins     BLURRED VISION    Current Outpatient Medications on File Prior to Visit  Medication Sig   aspirin 81 MG tablet Take 81 mg by mouth daily.   blood glucose meter kit and supplies Test sugars once dailyDispense based insurance preference. E11.9   Cholecalciferol 125 MCG (5000 UT) capsule Take 5,000 Units by mouth daily. (Patient not taking: Reported on 02/09/2022)   citalopram (CELEXA) 40 MG tablet Take 1 tablet (40 mg total) by mouth daily.   clonazePAM  (KLONOPIN) 1 MG tablet TAKE 1 TABLET BY MOUTH NIGHTLY FOR RESTLESS LEGS   diclofenac (VOLTAREN) 75 MG EC tablet Take 1 tablet (75 mg total) by mouth 2 (two) times daily between meals as needed.   ezetimibe (ZETIA) 10 MG tablet Take 1 tablet (10 mg total) by mouth daily. (Patient not taking: Reported on 02/09/2022)   gabapentin (NEURONTIN) 300 MG capsule Take 3 capsules (900 mg total) by mouth every night for chronic pain or sleep   glucose blood (FREESTYLE LITE) test strip Check  Blood Sugar  2 times daily   Lancets (FREESTYLE) lancets USE TO TEST BLOOD GLUCOSE ONCE A DAY   losartan (COZAAR) 50 MG tablet Take 1 tablet (50 mg total) by mouth daily.   Omega-3 Fatty Acids (FISH OIL) 600 MG CAPS Take by mouth. (Patient not taking: Reported on 02/09/2022)   omeprazole (PRILOSEC) 20 MG capsule Take 1 capsule (20 mg total) by mouth daily.   Semaglutide, 2 MG/DOSE, (OZEMPIC, 2 MG/DOSE,) 8 MG/3ML SOPN Inject 2 mg into the skin once a week.   TURMERIC PO Take 1 capsule by mouth 2 (two) times daily.    No current facility-administered medications on file prior to visit.    ROS: all negative except above.   Physical Exam:  There were no vitals taken for this visit.  General Appearance: Well nourished, in no apparent distress. Eyes: PERRLA, EOMs, conjunctiva no swelling or erythema Sinuses: No Frontal/maxillary tenderness ENT/Mouth: Ext aud canals clear, TMs without erythema, bulging. No erythema, swelling, or   exudate on post pharynx.  Tonsils not swollen or erythematous. Hearing normal.  Neck: Supple, thyroid normal.  Respiratory: Respiratory effort normal, BS equal bilaterally without rales, rhonchi, wheezing or stridor.  Cardio: RRR with no MRGs. Brisk peripheral pulses without edema.  Abdomen: Soft, + BS.  Non tender, no guarding, rebound, hernias, masses. Lymphatics: Non tender without lymphadenopathy.  Musculoskeletal: Full ROM, 5/5 strength, normal gait.  Skin: Warm, dry without rashes, lesions,  ecchymosis.  Neuro: Cranial nerves intact. Normal muscle tone, no cerebellar symptoms. Sensation intact.  Psych: Awake and oriented X 3, normal affect, Insight and Judgment appropriate.     Alycia Rossetti, NP 12:39 PM [facility]

## 2022-05-02 ENCOUNTER — Other Ambulatory Visit (HOSPITAL_COMMUNITY): Payer: Self-pay

## 2022-05-02 ENCOUNTER — Encounter: Payer: Self-pay | Admitting: Nurse Practitioner

## 2022-05-02 ENCOUNTER — Ambulatory Visit: Payer: 59 | Admitting: Nurse Practitioner

## 2022-05-02 VITALS — BP 110/58 | HR 62 | Temp 97.5°F | Ht 72.0 in | Wt 220.0 lb

## 2022-05-02 DIAGNOSIS — I10 Essential (primary) hypertension: Secondary | ICD-10-CM | POA: Diagnosis not present

## 2022-05-02 DIAGNOSIS — E0822 Diabetes mellitus due to underlying condition with diabetic chronic kidney disease: Secondary | ICD-10-CM | POA: Diagnosis not present

## 2022-05-02 DIAGNOSIS — E785 Hyperlipidemia, unspecified: Secondary | ICD-10-CM | POA: Diagnosis not present

## 2022-05-02 DIAGNOSIS — R710 Precipitous drop in hematocrit: Secondary | ICD-10-CM

## 2022-05-02 DIAGNOSIS — E1169 Type 2 diabetes mellitus with other specified complication: Secondary | ICD-10-CM | POA: Diagnosis not present

## 2022-05-02 DIAGNOSIS — D649 Anemia, unspecified: Secondary | ICD-10-CM | POA: Diagnosis not present

## 2022-05-02 DIAGNOSIS — R350 Frequency of micturition: Secondary | ICD-10-CM

## 2022-05-02 DIAGNOSIS — N181 Chronic kidney disease, stage 1: Secondary | ICD-10-CM | POA: Diagnosis not present

## 2022-05-02 DIAGNOSIS — N182 Chronic kidney disease, stage 2 (mild): Secondary | ICD-10-CM

## 2022-05-02 DIAGNOSIS — E1122 Type 2 diabetes mellitus with diabetic chronic kidney disease: Secondary | ICD-10-CM | POA: Diagnosis not present

## 2022-05-02 MED ORDER — LOSARTAN POTASSIUM 25 MG PO TABS
25.0000 mg | ORAL_TABLET | Freq: Every day | ORAL | 11 refills | Status: DC
  Filled 2022-05-02: qty 30, 30d supply, fill #0
  Filled 2022-05-25 – 2022-05-28 (×2): qty 30, 30d supply, fill #1
  Filled 2022-06-26: qty 30, 30d supply, fill #2
  Filled 2022-06-28: qty 90, 90d supply, fill #2
  Filled 2022-09-24: qty 90, 90d supply, fill #3
  Filled 2022-12-20: qty 90, 90d supply, fill #4
  Filled 2023-03-23: qty 90, 90d supply, fill #5

## 2022-05-02 NOTE — Patient Instructions (Signed)
Decrease Losartan to 1/2 tab daily until you use up current supply then will start on Losartan 25 mg daily Monitor BP and if consistently above 130/80 notify the office   Hypertension, Adult Hypertension is another name for high blood pressure. High blood pressure forces your heart to work harder to pump blood. This can cause problems over time. There are two numbers in a blood pressure reading. There is a top number (systolic) over a bottom number (diastolic). It is best to have a blood pressure that is below 120/80. What are the causes? The cause of this condition is not known. Some other conditions can lead to high blood pressure. What increases the risk? Some lifestyle factors can make you more likely to develop high blood pressure: Smoking. Not getting enough exercise or physical activity. Being overweight. Having too much fat, sugar, calories, or salt (sodium) in your diet. Drinking too much alcohol. Other risk factors include: Having any of these conditions: Heart disease. Diabetes. High cholesterol. Kidney disease. Obstructive sleep apnea. Having a family history of high blood pressure and high cholesterol. Age. The risk increases with age. Stress. What are the signs or symptoms? High blood pressure may not cause symptoms. Very high blood pressure (hypertensive crisis) may cause: Headache. Fast or uneven heartbeats (palpitations). Shortness of breath. Nosebleed. Vomiting or feeling like you may vomit (nauseous). Changes in how you see. Very bad chest pain. Feeling dizzy. Seizures. How is this treated? This condition is treated by making healthy lifestyle changes, such as: Eating healthy foods. Exercising more. Drinking less alcohol. Your doctor may prescribe medicine if lifestyle changes do not help enough and if: Your top number is above 130. Your bottom number is above 80. Your personal target blood pressure may vary. Follow these instructions at home: Eating  and drinking  If told, follow the DASH eating plan. To follow this plan: Fill one half of your plate at each meal with fruits and vegetables. Fill one fourth of your plate at each meal with whole grains. Whole grains include whole-wheat pasta, brown rice, and whole-grain bread. Eat or drink low-fat dairy products, such as skim milk or low-fat yogurt. Fill one fourth of your plate at each meal with low-fat (lean) proteins. Low-fat proteins include fish, chicken without skin, eggs, beans, and tofu. Avoid fatty meat, cured and processed meat, or chicken with skin. Avoid pre-made or processed food. Limit the amount of salt in your diet to less than 1,500 mg each day. Do not drink alcohol if: Your doctor tells you not to drink. You are pregnant, may be pregnant, or are planning to become pregnant. If you drink alcohol: Limit how much you have to: 0-1 drink a day for women. 0-2 drinks a day for men. Know how much alcohol is in your drink. In the [place] one drink equals one 12 oz bottle of beer (355 mL), one 5 oz glass of wine (148 mL), or one 1 oz glass of hard liquor (44 mL). Lifestyle  Work with your doctor to stay at a healthy weight or to lose weight. Ask your doctor what the best weight is for you. Get at least 30 minutes of exercise that causes your heart to beat faster (aerobic exercise) most days of the week. This may include walking, swimming, or biking. Get at least 30 minutes of exercise that strengthens your muscles (resistance exercise) at least 3 days a week. This may include lifting weights or doing Pilates. Do not smoke or use any products that contain  nicotine or tobacco. If you need help quitting, ask your doctor. Check your blood pressure at home as told by your doctor. Keep all follow-up visits. Medicines Take over-the-counter and prescription medicines only as told by your doctor. Follow directions carefully. Do not skip doses of blood pressure medicine. The medicine does  not work as well if you skip doses. Skipping doses also puts you at risk for problems. Ask your doctor about side effects or reactions to medicines that you should watch for. Contact a doctor if: You think you are having a reaction to the medicine you are taking. You have headaches that keep coming back. You feel dizzy. You have swelling in your ankles. You have trouble with your vision. Get help right away if: You get a very bad headache. You start to feel mixed up (confused). You feel weak or numb. You feel faint. You have very bad pain in your: Chest. Belly (abdomen). You vomit more than once. You have trouble breathing. These symptoms may be an emergency. Get help right away. Call 911. Do not wait to see if the symptoms will go away. Do not drive yourself to the hospital. Summary Hypertension is another name for high blood pressure. High blood pressure forces your heart to work harder to pump blood. For most people, a normal blood pressure is less than 120/80. Making healthy choices can help lower blood pressure. If your blood pressure does not get lower with healthy choices, you may need to take medicine. This information is not intended to replace advice given to you by your health care provider. Make sure you discuss any questions you have with your health care provider. Document Revised: 04/13/2021 Document Reviewed: 04/13/2021 Elsevier Patient Education  [place].

## 2022-05-03 ENCOUNTER — Other Ambulatory Visit: Payer: Self-pay | Admitting: Physician Assistant

## 2022-05-03 ENCOUNTER — Encounter: Payer: Self-pay | Admitting: Nurse Practitioner

## 2022-05-03 LAB — LIPID PANEL
Cholesterol: 247 mg/dL — ABNORMAL HIGH (ref <200)
HDL: 41 mg/dL (ref >=40)
LDL Cholesterol (Calc): 176 mg/dL (calc) — ABNORMAL HIGH
Non-HDL Cholesterol (Calc): 206 mg/dL (calc) — ABNORMAL HIGH (ref <130)
Total CHOL/HDL Ratio: 6 (calc) — ABNORMAL HIGH (ref <5.0)
Triglycerides: 157 mg/dL — ABNORMAL HIGH (ref <150)

## 2022-05-03 LAB — CBC WITH DIFFERENTIAL/PLATELET
Absolute Monocytes: 619 cells/uL (ref 200–950)
Basophils Absolute: 29 cells/uL (ref 0–200)
Basophils Relative: 0.4 %
Eosinophils Absolute: 302 cells/uL (ref 15–500)
Eosinophils Relative: 4.2 %
HCT: 36.7 % — ABNORMAL LOW (ref 38.5–50.0)
Hemoglobin: 12.2 g/dL — ABNORMAL LOW (ref 13.2–17.1)
Lymphs Abs: 2434 cells/uL (ref 850–3900)
MCH: 29.1 pg (ref 27.0–33.0)
MCHC: 33.2 g/dL (ref 32.0–36.0)
MCV: 87.6 fL (ref 80.0–100.0)
MPV: 9.9 fL (ref 7.5–12.5)
Monocytes Relative: 8.6 %
Neutro Abs: 3816 cells/uL (ref 1500–7800)
Neutrophils Relative %: 53 %
Platelets: 167 10*3/uL (ref 140–400)
RBC: 4.19 10*6/uL — ABNORMAL LOW (ref 4.20–5.80)
RDW: 12.4 % (ref 11.0–15.0)
Total Lymphocyte: 33.8 %
WBC: 7.2 10*3/uL (ref 3.8–10.8)

## 2022-05-03 LAB — URINALYSIS, ROUTINE W REFLEX MICROSCOPIC
Bacteria, UA: NONE SEEN /HPF
Bilirubin Urine: NEGATIVE
Glucose, UA: NEGATIVE
Hgb urine dipstick: NEGATIVE
Nitrite: NEGATIVE
RBC / HPF: NONE SEEN /HPF (ref 0–2)
Specific Gravity, Urine: 1.025 (ref 1.001–1.035)
pH: 5.5 (ref 5.0–8.0)

## 2022-05-03 LAB — MICROSCOPIC MESSAGE

## 2022-05-03 LAB — COMPLETE METABOLIC PANEL WITH GFR
AG Ratio: 1.4 (calc) (ref 1.0–2.5)
ALT: 16 U/L (ref 9–46)
AST: 17 U/L (ref 10–35)
Albumin: 4.1 g/dL (ref 3.6–5.1)
Alkaline phosphatase (APISO): 62 U/L (ref 35–144)
BUN/Creatinine Ratio: 15 (calc) (ref 6–22)
BUN: 21 mg/dL (ref 7–25)
CO2: 26 mmol/L (ref 20–32)
Calcium: 9.6 mg/dL (ref 8.6–10.3)
Chloride: 103 mmol/L (ref 98–110)
Creat: 1.42 mg/dL — ABNORMAL HIGH (ref 0.70–1.35)
Globulin: 3 g/dL (calc) (ref 1.9–3.7)
Glucose, Bld: 98 mg/dL (ref 65–99)
Potassium: 4.2 mmol/L (ref 3.5–5.3)
Sodium: 140 mmol/L (ref 135–146)
Total Bilirubin: 1 mg/dL (ref 0.2–1.2)
Total Protein: 7.1 g/dL (ref 6.1–8.1)
eGFR: 54 mL/min/{1.73_m2} — ABNORMAL LOW (ref >=60)

## 2022-05-03 LAB — HEMOGLOBIN A1C
Hgb A1c MFr Bld: 5.4 % of total Hgb (ref <5.7)
Mean Plasma Glucose: 108 mg/dL
eAG (mmol/L): 6 mmol/L

## 2022-05-03 LAB — URINE CULTURE
MICRO NUMBER:: 14100220
Result:: NO GROWTH
SPECIMEN QUALITY:: ADEQUATE

## 2022-05-04 ENCOUNTER — Other Ambulatory Visit: Payer: Self-pay | Admitting: Nurse Practitioner

## 2022-05-04 DIAGNOSIS — N183 Chronic kidney disease, stage 3 unspecified: Secondary | ICD-10-CM

## 2022-05-09 ENCOUNTER — Other Ambulatory Visit: Payer: Self-pay | Admitting: Physician Assistant

## 2022-05-09 ENCOUNTER — Other Ambulatory Visit (HOSPITAL_COMMUNITY): Payer: Self-pay

## 2022-05-15 NOTE — Progress Notes (Unsigned)
 COMPLETE PHYSICAL     Assessment and Plan:  Reedy was seen today for annual exam.  Diagnoses and all orders for this visit:  Encounter for general adult medical examination with abnormal findings Yearly  Type 2 diabetes mellitus with stage 3 chronic kidney disease, without long-term current use of insulin ([place]) Continue medications:Ozempic but will decrease to 1 mg dose as his appetite is very decreased on 2 mg dosage Discussed general issues about diabetes pathophysiology and management. Education: Reviewed 'ABCs' of diabetes management (respective goals in parentheses):  A1C (<7), blood pressure (<130/80), and cholesterol (LDL <70) Dietary recommendations Encouraged aerobic exercise.  Discussed foot care, check daily Yearly retinal exam Dental exam every 6 months Monitor blood glucose, discussed goal for patient - Routine urine with reflex microscopic - Microalbumin/creatinine urine ratio  CKD stage 3 related to Type 2 DM(HCC) Increase fluids, avoid NSAIDS, monitor sugars, will monitor   Essential hypertension Continue current medications: Losartan 25 mg QD Monitor blood pressure at home; call if consistently over 130/80 Continue DASH diet.   Reminder to go to the ER if any CP, SOB, nausea, dizziness, severe HA, changes vision/speech, left arm numbness and tingling and jaw pain. -     CBC with Differential/Platelet -     COMPLETE METABOLIC PANEL WITH GFR -     TSH -     Magnesium  EKG 12 lead  Hyperlipidemia, unspecified hyperlipidemia type/Statin myopathy Unable to take statins or Zetia due to myopathys Discussed coronary calcium score, pt considering Discussed dietary and exercise modifications Low fat diet -     Lipid panel   Vitamin D deficiency Continue supplementation to maintain goal of 70-100 Taking Vitamin D5,000 IU daily -     VITAMIN D 25 Hydroxy (Vit-D Deficiency, Fractures)  OSA on CPAP Continue CPAP/BiPAP, using nightly for at least 8 hours   Helping with daytime fatigue Weight loss still advised Discussed mask & tubing hygeine  Diabetic peripheral neuropathy associated with type 2 diabetes mellitus (HCC) Doing well at this time Checks feet daily Gabapentin 39m  Generalized anxiety disorder Citalopram 422m and clonazepam [id number] with RLS as well  Class 2 Obesity with serious comorbidity Discussed dietary and exercise modifications  Asymptomatic varicose veins of both lower extremities Discussed compression stockings  Primary insomnia Doing well Gabapentin [id number]  Medication management Magnesium  Vitamin D deficiency Continue Vit D supplementation Check Vit D  Pituitary Microadenoma Continue to follow with Dr. ShamKelton Pillart visual fields tested at ophthalmologist- will schedule appointment  BPH with lower urinary tract symptoms PSA Only getting up once a night, will continue to monitor  Restless leg syndrome No longer taking requip, has no trouble falling asleep and is sleeping in separate bedroom from wife  Essential tremor Discussed use of beta blocker but declines at this time Continue to monitor  Discussed med's effects and SE's. Screening labs and tests as requested with regular follow-up as recommended. Over 40 minutes of face to face interview,  exam, counseling, chart review and critical decision making was performed  Future Appointments  Date Time Provider [place]8/2024  1:45 PM HiltPixie Casino [facility] None  10/22/2022  9:30 AM [name], IbteMelanie Crazier [facility] None    HPI Patient presents for a complete physical.  66 y64. male following up on HTN, HLD, DMII, GERD, weight and vitamin D defciency.  Losartan was decreased to 25 mg at last visit and BP is running in normal range. Today their BP is  BP: 132/80 Denies headaches, chest pain, shortness of breath and dizziness.  BP Readings from Last 3 Encounters:  05/16/22 132/80  05/02/22 (!)  110/58  02/22/22 118/70    BMI is Body mass index is 30.93 kg/m., he has not been working on diet and exercise.He has been drinking regular sodas, encouraged to drink diet.  Wt Readings from Last 3 Encounters:  05/16/22 221 lb 12.8 oz (100.6 kg)  05/02/22 220 lb (99.8 kg)  02/22/22 226 lb (102.5 kg)  He does not workout, but stays active,working for [employer], works 3+ days a week, lots of walking, retired Psychologist, occupational. Avg 5-7 miles a day when works  He denies chest pain, shortness of breath, dizziness.   He is on celexa for anxiety, only on 1/2 a pill.   He is on cholesterol medication, he is on zetia and crestor, and endorses myalgias.  He stopped rosuvastatin once before but cholesterol increased so he restarted this.  His cholesterol is not at goal less than 70. The cholesterol last visit was:   Lab Results  Component Value Date   CHOL 247 (H) 05/02/2022   HDL 41 05/02/2022   LDLCALC 176 (H) 05/02/2022   TRIG 157 (H) 05/02/2022   CHOLHDL 6.0 (H) 05/02/2022    He has been working on diet and exercise for diabetes  with CKD is on ACE/ARB With hyperlipidemia he was on crestor 9m and zetia 161mbut had side effects- currently on no cholesterol medication he is on Ozempic 2 mg SQ QW, will decrease to 1 mg since his appetite is very decreased and eating very little  he is on bASA Checking sugars 130 Eye Exam: needs to schedule eye exam and denies paresthesia of the feet, polydipsia, polyuria and visual disturbances.  Last A1C in the office was:  Lab Results  Component Value Date   HGBA1C 5.4 05/02/2022    He needs to push more fluids Lab Results  Component Value Date   EGFR 54 (L) 05/02/2022    Lab Results  Component Value Date   CHOL 247 (H) 05/02/2022   HDL 41 05/02/2022   LDLCALC 176 (H) 05/02/2022   TRIG 157 (H) 05/02/2022   CHOLHDL 6.0 (H) 05/02/2022    Patient is on Vitamin D supplement, not on vitamin D.   Lab Results  Component Value Date   VD25OH 26.63  (L) 10/25/2021     Last PSA was: Lab Results  Component Value Date   PSA 0.11 05/16/2021   His father had tremors , he has been developing more tremors in his hands.   Current Medications:  Current Outpatient Medications on File Prior to Visit  Medication Sig Dispense Refill   aspirin 81 MG tablet Take 81 mg by mouth daily.     blood glucose meter kit and supplies Test sugars once dailyDispense based insurance preference. E11.9 1 each 0   citalopram (CELEXA) 40 MG tablet Take 1 tablet (40 mg total) by mouth daily. 90 tablet 2   clonazePAM (KLONOPIN) 1 MG tablet TAKE 1 TABLET BY MOUTH NIGHTLY FOR RESTLESS LEGS 90 tablet 0   gabapentin (NEURONTIN) 300 MG capsule Take 3 capsules (900 mg total) by mouth every night for chronic pain or sleep 270 capsule 1   glucose blood (FREESTYLE LITE) test strip Check  Blood Sugar  2 times daily 200 strip 3   Lancets (FREESTYLE) lancets USE TO TEST BLOOD GLUCOSE ONCE A DAY 100 each 0   losartan (COZAAR) 25 MG tablet  Take 1 tablet (25 mg total) by mouth daily. 30 tablet 11   omeprazole (PRILOSEC) 20 MG capsule Take 1 capsule (20 mg total) by mouth daily. 90 capsule 2   Semaglutide, 2 MG/DOSE, (OZEMPIC, 2 MG/DOSE,) 8 MG/3ML SOPN Inject 2 mg into the skin once a week. 3 mL 3   TURMERIC PO Take 1 capsule by mouth 2 (two) times daily.  (Patient not taking: Reported on 05/02/2022)     No current facility-administered medications on file prior to visit.   Health Maintenance:  Immunization History  Administered Date(s) Administered   Influenza-Unspecified 05/07/2013, 04/22/2020, 04/10/2021, 04/18/2022   Pneumococcal Polysaccharide-23 06/24/2013   Td 07/09/2006   Tetanus: 2016 at work Pneumovax: 2014 Prevnar 13: Flu vaccine: at work 2021 Zostavax/Shingrix: Discussed with patient DEXA: Colonoscopy: 05/2018 ERCP 08/2018 EGD: Sleep study 2017 Eye Exam: [facility] 2021 Dentist: Dentures, fit well.  Patient Care Team: Unk Pinto, MD as PCP -  General (Internal Medicine) Carol Ada, MD as Consulting Physician (Gastroenterology)  Dr. Johnsie Cancel 2007 normal stress test Dr. Kellie Simmering Dr. Benson [place] Dr. Lorin Mercy  Dr. Kristian Covey  Medical History:  Past Medical History:  Diagnosis Date   Acute calculous cholecystitis s/p lap cholecystectopmy 05/23/2018 05/22/2018   Cancer ([place])    skin cancer - basal cell   Diabetes mellitus without complication ([place])    Full dentures    GERD (gastroesophageal reflux disease)    Heart murmur    no problems per pt   Hernia, abdominal    Hyperlipidemia    Hypertension    OSA (obstructive sleep apnea) 08/03/2013   pt does not use CPAP   Pneumonia    as a small child only   Superficial thrombophlebitis of left upper extremity 06/18/2018   Unspecified vitamin D deficiency    Wears glasses    Allergies Allergies  Allergen Reactions   Caduet [Amlodipine-Atorvastatin] Hives and Itching   Statins     BLURRED VISION    SURGICAL HISTORY He  has a past surgical history that includes orthopedic surgeries; Laser ablation; Vasectomy; Metatarsal osteotomy with bunionectomy; Cholecystectomy (N/A, 05/23/2018); Multiple tooth extractions; Upper esophageal endoscopic ultrasound (eus) (N/A, 07/23/2018); biopsy (07/23/2018); Esophagogastroduodenoscopy (egd) with propofol (N/A, 07/23/2018); Endoscopic retrograde cholangiopancreatography (ercp) with propofol (N/A, 08/13/2018); biopsy (08/13/2018); sphincterotomy (08/13/2018); and removal of stones (08/13/2018). FAMILY HISTORY His family history includes Cancer in his cousin and paternal uncle; Diabetes in his sister; Heart disease in his father; Hyperlipidemia in his father; Hypertension in his brother; Liver cancer in his cousin; Melanoma (age of onset: 32) in his mother; Multiple endocrine neoplasia in his maternal grandfather, mother, and sister; Skin cancer in his sister and sister; Stomach cancer in his maternal grandfather. SOCIAL HISTORY He  reports that he has quit  smoking. He has quit using smokeless tobacco.  His smokeless tobacco use included chew. He reports that he does not drink alcohol and does not use drugs.  Review of Systems:  Review of Systems  Constitutional: Negative.  Negative for chills and fever.  HENT:  Positive for tinnitus. Negative for congestion, ear discharge, ear pain, hearing loss, nosebleeds, sinus pain and sore throat.   Eyes:  Positive for blurred vision (in morning but clears during the day). Negative for double vision.  Respiratory: Negative.  Negative for cough, hemoptysis, sputum production, shortness of breath, wheezing and stridor.   Cardiovascular:  Negative for chest pain, palpitations, orthopnea, claudication, leg swelling and PND.  Gastrointestinal:  Positive for constipation (uses metamucil). Negative for abdominal pain, diarrhea, heartburn,  nausea and vomiting.  Genitourinary:  Negative for dysuria, frequency and urgency.       Gets up once at night to urinate  Musculoskeletal:  Positive for back pain and joint pain (knees). Negative for falls, myalgias and neck pain.  Skin: Negative.  Negative for rash.  Neurological:  Positive for tremors (hands). Negative for dizziness, tingling, weakness and headaches.  Endo/Heme/Allergies:  Does not bruise/bleed easily.  Psychiatric/Behavioral:  Negative for depression, hallucinations, memory loss, substance abuse and suicidal ideas. The patient has insomnia. The patient is not nervous/anxious.     Physical Exam: Estimated body mass index is 30.93 kg/m as calculated from the following:   Height as of this encounter: _0  (1.803 m).   Weight as of this encounter: 221 lb 12.8 oz (100.6 kg). BP 132/80   Pulse 78   Temp (!) 97.5 F (36.4 C)   Ht _1  (1.803 m)   Wt 221 lb 12.8 oz (100.6 kg)   SpO2 96%   BMI 30.93 kg/m  General Appearance: Well nourished, in no apparent distress.  Eyes: PERRLA, EOMs, conjunctiva no swelling or erythema, normal fundi and vessels.   Sinuses: No Frontal/maxillary tenderness  ENT/Mouth: Ext aud canals clear, normal light reflex with TMs without erythema, bulging. Good dentition. No erythema, swelling, or exudate on post pharynx. Tonsils not swollen or erythematous. Hearing normal.  Neck: Supple, thyroid normal. No bruits  Respiratory: Respiratory effort normal, BS equal bilaterally without rales, rhonchi, wheezing or stridor.  Cardio: RRR without murmurs, rubs or gallops. Brisk peripheral pulses without edema.  Chest: symmetric, with normal excursions and percussion.  Abdomen: Soft, nontender, no guarding, rebound, hernias, masses, or organomegaly.  Lymphatics: Non tender without lymphadenopathy.  Genitourinary: defer Musculoskeletal: Full ROM all peripheral extremities,5/5 strength, and normal gait. Bilateral varicose veins lower legs    Skin: Warm, dry without rashes, lesions, ecchymosis. Neuro: Cranial nerves intact, reflexes equal bilaterally. Normal muscle tone, no cerebellar symptoms. Sensation decreased bilateral feet to ankle. Mild tremor noted of hands, no cogwheel rigidity. Psych: Awake and oriented X 3, normal affect, Insight and Judgment appropriate.     EKG: NSR, AV BLOCK I  AORTA SCAN: < 3 cm  Anatalia Kronk E  10:09 AM [facility]

## 2022-05-16 ENCOUNTER — Ambulatory Visit (INDEPENDENT_AMBULATORY_CARE_PROVIDER_SITE_OTHER): Payer: 59 | Admitting: Nurse Practitioner

## 2022-05-16 ENCOUNTER — Encounter: Payer: Self-pay | Admitting: Nurse Practitioner

## 2022-05-16 VITALS — BP 132/80 | HR 78 | Temp 97.5°F | Ht 71.0 in | Wt 221.8 lb

## 2022-05-16 DIAGNOSIS — I700 Atherosclerosis of aorta: Secondary | ICD-10-CM | POA: Diagnosis not present

## 2022-05-16 DIAGNOSIS — N183 Chronic kidney disease, stage 3 unspecified: Secondary | ICD-10-CM

## 2022-05-16 DIAGNOSIS — I10 Essential (primary) hypertension: Secondary | ICD-10-CM | POA: Diagnosis not present

## 2022-05-16 DIAGNOSIS — Z0001 Encounter for general adult medical examination with abnormal findings: Secondary | ICD-10-CM

## 2022-05-16 DIAGNOSIS — D352 Benign neoplasm of pituitary gland: Secondary | ICD-10-CM

## 2022-05-16 DIAGNOSIS — E1169 Type 2 diabetes mellitus with other specified complication: Secondary | ICD-10-CM

## 2022-05-16 DIAGNOSIS — G2581 Restless legs syndrome: Secondary | ICD-10-CM

## 2022-05-16 DIAGNOSIS — Z79899 Other long term (current) drug therapy: Secondary | ICD-10-CM | POA: Diagnosis not present

## 2022-05-16 DIAGNOSIS — Z136 Encounter for screening for cardiovascular disorders: Secondary | ICD-10-CM

## 2022-05-16 DIAGNOSIS — N401 Enlarged prostate with lower urinary tract symptoms: Secondary | ICD-10-CM

## 2022-05-16 DIAGNOSIS — E559 Vitamin D deficiency, unspecified: Secondary | ICD-10-CM | POA: Diagnosis not present

## 2022-05-16 DIAGNOSIS — I8393 Asymptomatic varicose veins of bilateral lower extremities: Secondary | ICD-10-CM

## 2022-05-16 DIAGNOSIS — F411 Generalized anxiety disorder: Secondary | ICD-10-CM

## 2022-05-16 DIAGNOSIS — E785 Hyperlipidemia, unspecified: Secondary | ICD-10-CM | POA: Diagnosis not present

## 2022-05-16 DIAGNOSIS — T466X5A Adverse effect of antihyperlipidemic and antiarteriosclerotic drugs, initial encounter: Secondary | ICD-10-CM

## 2022-05-16 DIAGNOSIS — Z1389 Encounter for screening for other disorder: Secondary | ICD-10-CM | POA: Diagnosis not present

## 2022-05-16 DIAGNOSIS — Z0000 Encounter for general adult medical examination without abnormal findings: Secondary | ICD-10-CM

## 2022-05-16 DIAGNOSIS — R5383 Other fatigue: Secondary | ICD-10-CM

## 2022-05-16 DIAGNOSIS — R710 Precipitous drop in hematocrit: Secondary | ICD-10-CM

## 2022-05-16 DIAGNOSIS — F5101 Primary insomnia: Secondary | ICD-10-CM

## 2022-05-16 DIAGNOSIS — G250 Essential tremor: Secondary | ICD-10-CM

## 2022-05-16 DIAGNOSIS — E1142 Type 2 diabetes mellitus with diabetic polyneuropathy: Secondary | ICD-10-CM

## 2022-05-16 DIAGNOSIS — G4733 Obstructive sleep apnea (adult) (pediatric): Secondary | ICD-10-CM

## 2022-05-16 DIAGNOSIS — N182 Chronic kidney disease, stage 2 (mild): Secondary | ICD-10-CM

## 2022-05-16 NOTE — Patient Instructions (Signed)

## 2022-05-17 LAB — TESTOSTERONE: Testosterone: 172 ng/dL — ABNORMAL LOW (ref 250–827)

## 2022-05-17 LAB — URINALYSIS, ROUTINE W REFLEX MICROSCOPIC
Bilirubin Urine: NEGATIVE
Glucose, UA: NEGATIVE
Hgb urine dipstick: NEGATIVE
Ketones, ur: NEGATIVE
Leukocytes,Ua: NEGATIVE
Nitrite: NEGATIVE
Protein, ur: NEGATIVE
Specific Gravity, Urine: 1.018 (ref 1.001–1.035)
pH: 6.5 (ref 5.0–8.0)

## 2022-05-17 LAB — VITAMIN D 25 HYDROXY (VIT D DEFICIENCY, FRACTURES): Vit D, 25-Hydroxy: 31 ng/mL (ref 30–100)

## 2022-05-17 LAB — MICROALBUMIN / CREATININE URINE RATIO
Creatinine, Urine: 150 mg/dL (ref 20–320)
Microalb Creat Ratio: 2 mcg/mg creat (ref <30)
Microalb, Ur: 0.3 mg/dL

## 2022-05-17 LAB — TSH: TSH: 2.32 mIU/L (ref 0.40–4.50)

## 2022-05-17 LAB — PSA: PSA: 0.15 ng/mL (ref <=4.00)

## 2022-05-17 LAB — MAGNESIUM: Magnesium: 1.6 mg/dL (ref 1.5–2.5)

## 2022-05-25 ENCOUNTER — Other Ambulatory Visit (HOSPITAL_COMMUNITY): Payer: Self-pay

## 2022-05-25 ENCOUNTER — Encounter: Payer: Self-pay | Admitting: Nurse Practitioner

## 2022-05-28 ENCOUNTER — Other Ambulatory Visit (HOSPITAL_COMMUNITY): Payer: Self-pay

## 2022-06-08 ENCOUNTER — Other Ambulatory Visit: Payer: Self-pay

## 2022-06-08 ENCOUNTER — Other Ambulatory Visit: Payer: Self-pay | Admitting: Nurse Practitioner

## 2022-06-08 ENCOUNTER — Other Ambulatory Visit (HOSPITAL_COMMUNITY): Payer: Self-pay

## 2022-06-11 ENCOUNTER — Other Ambulatory Visit (HOSPITAL_COMMUNITY): Payer: Self-pay

## 2022-06-11 MED ORDER — GABAPENTIN 300 MG PO CAPS
900.0000 mg | ORAL_CAPSULE | Freq: Every evening | ORAL | 1 refills | Status: DC
  Filled 2022-06-11: qty 270, 90d supply, fill #0
  Filled 2022-09-14: qty 270, 90d supply, fill #1

## 2022-06-13 ENCOUNTER — Encounter: Payer: Self-pay | Admitting: Internal Medicine

## 2022-06-13 DIAGNOSIS — D352 Benign neoplasm of pituitary gland: Secondary | ICD-10-CM | POA: Diagnosis not present

## 2022-06-14 ENCOUNTER — Other Ambulatory Visit (HOSPITAL_COMMUNITY): Payer: Self-pay

## 2022-06-18 ENCOUNTER — Encounter: Payer: Self-pay | Admitting: Nurse Practitioner

## 2022-06-19 ENCOUNTER — Other Ambulatory Visit: Payer: Self-pay | Admitting: Nurse Practitioner

## 2022-06-19 ENCOUNTER — Other Ambulatory Visit (HOSPITAL_COMMUNITY): Payer: Self-pay

## 2022-06-19 MED ORDER — CITALOPRAM HYDROBROMIDE 40 MG PO TABS
40.0000 mg | ORAL_TABLET | Freq: Every day | ORAL | 2 refills | Status: DC
  Filled 2022-06-19: qty 90, 90d supply, fill #0
  Filled 2022-09-14: qty 90, 90d supply, fill #1
  Filled 2022-12-10: qty 90, 90d supply, fill #2

## 2022-06-20 ENCOUNTER — Other Ambulatory Visit (HOSPITAL_COMMUNITY): Payer: Self-pay

## 2022-06-28 ENCOUNTER — Other Ambulatory Visit (HOSPITAL_COMMUNITY): Payer: Self-pay

## 2022-07-11 ENCOUNTER — Other Ambulatory Visit (HOSPITAL_COMMUNITY): Payer: Self-pay

## 2022-07-16 ENCOUNTER — Ambulatory Visit: Payer: 59 | Admitting: Internal Medicine

## 2022-07-27 ENCOUNTER — Other Ambulatory Visit: Payer: Self-pay | Admitting: Nurse Practitioner

## 2022-07-27 ENCOUNTER — Other Ambulatory Visit (HOSPITAL_COMMUNITY): Payer: Self-pay

## 2022-07-27 DIAGNOSIS — R251 Tremor, unspecified: Secondary | ICD-10-CM

## 2022-07-27 MED ORDER — CLONAZEPAM 1 MG PO TABS
1.0000 mg | ORAL_TABLET | Freq: Every evening | ORAL | 0 refills | Status: DC
  Filled 2022-07-27: qty 90, 90d supply, fill #0

## 2022-07-30 ENCOUNTER — Encounter: Payer: Self-pay | Admitting: Nurse Practitioner

## 2022-07-31 ENCOUNTER — Other Ambulatory Visit: Payer: Self-pay | Admitting: Nurse Practitioner

## 2022-07-31 DIAGNOSIS — E1122 Type 2 diabetes mellitus with diabetic chronic kidney disease: Secondary | ICD-10-CM

## 2022-08-02 ENCOUNTER — Other Ambulatory Visit (INDEPENDENT_AMBULATORY_CARE_PROVIDER_SITE_OTHER): Payer: Commercial Managed Care - PPO

## 2022-08-02 DIAGNOSIS — E1122 Type 2 diabetes mellitus with diabetic chronic kidney disease: Secondary | ICD-10-CM

## 2022-08-02 DIAGNOSIS — N183 Chronic kidney disease, stage 3 unspecified: Secondary | ICD-10-CM | POA: Diagnosis not present

## 2022-08-03 ENCOUNTER — Other Ambulatory Visit: Payer: Self-pay | Admitting: Nurse Practitioner

## 2022-08-03 ENCOUNTER — Other Ambulatory Visit (HOSPITAL_COMMUNITY): Payer: Self-pay

## 2022-08-03 DIAGNOSIS — M1990 Unspecified osteoarthritis, unspecified site: Secondary | ICD-10-CM

## 2022-08-03 LAB — BASIC METABOLIC PANEL WITH GFR
BUN: 13 mg/dL (ref 7–25)
CO2: 29 mmol/L (ref 20–32)
Calcium: 9.2 mg/dL (ref 8.6–10.3)
Chloride: 103 mmol/L (ref 98–110)
Creat: 0.97 mg/dL (ref 0.70–1.35)
Glucose, Bld: 104 mg/dL — ABNORMAL HIGH (ref 65–99)
Potassium: 3.9 mmol/L (ref 3.5–5.3)
Sodium: 141 mmol/L (ref 135–146)
eGFR: 86 mL/min/{1.73_m2} (ref >=60)

## 2022-08-03 MED ORDER — MELOXICAM 7.5 MG PO TABS
ORAL_TABLET | ORAL | 1 refills | Status: DC
  Filled 2022-08-03: qty 30, 30d supply, fill #0
  Filled 2022-08-28: qty 30, 30d supply, fill #1

## 2022-08-23 ENCOUNTER — Ambulatory Visit: Payer: Commercial Managed Care - PPO | Admitting: Nurse Practitioner

## 2022-08-24 DIAGNOSIS — C44329 Squamous cell carcinoma of skin of other parts of face: Secondary | ICD-10-CM | POA: Diagnosis not present

## 2022-08-24 DIAGNOSIS — L570 Actinic keratosis: Secondary | ICD-10-CM | POA: Diagnosis not present

## 2022-08-24 DIAGNOSIS — D485 Neoplasm of uncertain behavior of skin: Secondary | ICD-10-CM | POA: Diagnosis not present

## 2022-08-24 DIAGNOSIS — C4442 Squamous cell carcinoma of skin of scalp and neck: Secondary | ICD-10-CM | POA: Diagnosis not present

## 2022-08-24 DIAGNOSIS — Z08 Encounter for follow-up examination after completed treatment for malignant neoplasm: Secondary | ICD-10-CM | POA: Diagnosis not present

## 2022-08-24 DIAGNOSIS — Z85828 Personal history of other malignant neoplasm of skin: Secondary | ICD-10-CM | POA: Diagnosis not present

## 2022-08-29 NOTE — Progress Notes (Addendum)
 3 MONTH FOLLOW UP    Assessment and Plan:  Zedrick was seen today for annual exam.  Diagnoses and all orders for this visit:  Type 2 diabetes mellitus with stage 2 chronic kidney disease, without long-term current use of insulin (HCC) Continue medications:Ozempic 2 mg QW Discussed general issues about diabetes pathophysiology and management. Education: Reviewed 'ABCs' of diabetes management (respective goals in parentheses):  A1C (<7), blood pressure (<130/80), and cholesterol (LDL <70) Dietary recommendations Encouraged aerobic exercise.  Discussed foot care, check daily Yearly retinal exam- scheduled for May Dental exam every 6 months Monitor blood glucose, discussed goal for patient -     Hemoglobin A1c   Essential hypertension Continue current medications: Losartan '50mg'$  Monitor blood pressure at home; call if consistently over 130/80 Continue DASH diet.   Reminder to go to the ER if any CP, SOB, nausea, dizziness, severe HA, changes vision/speech, left arm numbness and tingling and jaw pain. -     CBC with Differential/Platelet -     COMPLETE METABOLIC PANEL WITH GFR   Hyperlipidemia, unspecified hyperlipidemia type/Statin myopathy Unable to take statins or Zetia due to myopathys Refer to lipid clinic for evaluation Discussed dietary and exercise modifications Low fat diet -     Lipid panel   Vitamin D deficiency Continue supplementation to maintain goal of 70-100 Taking Vitamin D5,000 IU daily   Diabetic peripheral neuropathy associated with type 2 diabetes mellitus ([place]) Doing well at this time Checks feet daily Gabapentin '300mg'$   Generalized anxiety disorder Citalopram '40mg'$  daily Stopped Klonopin and has noticed his personality is much improved  Class 2 severe Obesity with serious comorbidity Discussed dietary and exercise modifications   Primary insomnia Doing well Gabapentin '300mg'$  nighty  Medication management Continued  Vitamin D  deficiency Continue Vit D supplementation   Pituitary Microadenoma Continue to follow with Dr. Kelton Pillar  Get visual fields tested at ophthalmologist- has appt schedule for May Saw Dr. Zada Finders and is to have another MRI in 6 months  Right knee pain Continue curcumin and exercise If pain persists will refer to orthopedics    Discussed med's effects and SE's. Screening labs and tests as requested with regular follow-up as recommended. Over 40 minutes of face to face interview,  exam, counseling, chart review and critical decision making was performed  Future Appointments  Date Time Provider [place]  08/31/2022 10:30 AM Alycia Rossetti, NP [facility] None  10/22/2022  9:30 AM [name], Melanie Crazier, MD [facility] None  05/20/2023 10:00 AM Alycia Rossetti, NP [facility] None    HPI Patient presents for a complete physical.  67 y.o. male following up on HTN, HLD, DMII, GERD, weight and vitamin D defciency.  Pituitary adenoma has spiked to the side and he has seen Dr Zada Finders. He advised it had shrunk, the spidering were not concerned.  Will have another MRI in 6 months. Occasional blurred vision and has appointment with eye doctor 11/2021. Continues to have some fuzziness in vision   His blood pressure has not monitored at home, today their BP is    BP Readings from Last 3 Encounters:  05/16/22 132/80  05/02/22 (!) 110/58  02/22/22 118/70    BMI is There is no height or weight on file to calculate BMI., he has not been working on diet and exercise. No other exercise than work.  Wt Readings from Last 3 Encounters:  05/16/22 221 lb 12.8 oz (100.6 kg)  05/02/22 220 lb (99.8 kg)  02/22/22 226 lb (102.5 kg)  He does not workout, but stays active,working for [employer], works 3+ days a week, lots of walking, retired Psychologist, occupational. Avg 5-7 miles a day when works  He denies chest pain, shortness of breath, dizziness.  He is on celexa for anxiety  He is not  taking any medication for cholesterol currently.He previously was on Rosuvastatin but made his knees hurt.  Tried Zetia and it hurt his joints.  His cholesterol is not at goal less than 70. The cholesterol last visit was:   Lab Results  Component Value Date   CHOL 247 (H) 05/02/2022   HDL 41 05/02/2022   LDLCALC 176 (H) 05/02/2022   TRIG 157 (H) 05/02/2022   CHOLHDL 6.0 (H) 05/02/2022     He has been working on diet and exercise for diabetes  with CKD is on ACE/ARB With hyperlipidemia he was on crestor '5mg'$  and zetia '10mg'$  but has stopped both he stopped his synjardy due to severe diarrhea and is only taking Ozempic  he is on bASA Has not been checking blood sugars regularly. Blood sugar last check 108.  Eye Exam: Dr. Delman Cheadle 12/08/21 and denies paresthesia of the feet, polydipsia, polyuria and visual disturbances.  Last A1C in the office was:  Lab Results  Component Value Date   HGBA1C 5.4 05/02/2022   Lab Results  Component Value Date   EGFR 86 08/02/2022     Patient is on Vitamin D supplement, not on vitamin D.   Lab Results  Component Value Date   VD25OH 31 05/16/2022     Last PSA was: Lab Results  Component Value Date   PSA 0.15 05/16/2022      Current Medications:  Current Outpatient Medications on File Prior to Visit  Medication Sig Dispense Refill   aspirin 81 MG tablet Take 81 mg by mouth daily.     blood glucose meter kit and supplies Test sugars once dailyDispense based insurance preference. E11.9 1 each 0   citalopram (CELEXA) 40 MG tablet Take 1 tablet (40 mg total) by mouth daily. 90 tablet 2   clonazePAM (KLONOPIN) 1 MG tablet TAKE 1 TABLET BY MOUTH NIGHTLY FOR RESTLESS LEGS 90 tablet 0   gabapentin (NEURONTIN) 300 MG capsule Take 3 capsules (900 mg total) by mouth every night for chronic pain or sleep 270 capsule 1   glucose blood (FREESTYLE LITE) test strip Check  Blood Sugar  2 times daily 200 strip 3   Lancets (FREESTYLE) lancets USE TO TEST BLOOD  GLUCOSE ONCE A DAY 100 each 0   losartan (COZAAR) 25 MG tablet Take 1 tablet (25 mg total) by mouth daily. 30 tablet 11   meloxicam (MOBIC) 7.5 MG tablet Take 1 tablet daily as needed, try to limit to 5 days a week 30 tablet 1   omeprazole (PRILOSEC) 20 MG capsule Take 1 capsule (20 mg total) by mouth daily. 90 capsule 2   Semaglutide, 2 MG/DOSE, (OZEMPIC, 2 MG/DOSE,) 8 MG/3ML SOPN Inject 2 mg into the skin once a week. 3 mL 3   No current facility-administered medications on file prior to visit.   Health Maintenance:  Immunization History  Administered Date(s) Administered   Influenza-Unspecified 05/07/2013, 04/22/2020, 04/10/2021, 04/18/2022   Pneumococcal Polysaccharide-23 06/24/2013   Td 07/09/2006   Tetanus: 2016 at work Pneumovax: 2014 Prevnar 13: Flu vaccine: at work 2021 Zostavax/Shingrix: Discussed with patient DEXA: Colonoscopy: 05/2018 ERCP 08/2018 EGD: Sleep study 2017 Eye Exam: [facility] 2021 Dentist: Dentures, fit well.  Patient Care Team: Melford Aase,  Gwyndolyn Saxon, MD as PCP - General (Internal Medicine) Carol Ada, MD as Consulting Physician (Gastroenterology)  Dr. Johnsie Cancel 2007 normal stress test Dr. Kellie Simmering Dr. Benson [place] Dr. Lorin Mercy  Dr. Kristian Covey  Medical History:  Past Medical History:  Diagnosis Date   Acute calculous cholecystitis s/p lap cholecystectopmy 05/23/2018 05/22/2018   Cancer ([place])    skin cancer - basal cell   Diabetes mellitus without complication ([place])    Full dentures    GERD (gastroesophageal reflux disease)    Heart murmur    no problems per pt   Hernia, abdominal    Hyperlipidemia    Hypertension    OSA (obstructive sleep apnea) 08/03/2013   pt does not use CPAP   Pneumonia    as a small child only   Superficial thrombophlebitis of left upper extremity 06/18/2018   Unspecified vitamin D deficiency    Wears glasses    Allergies Allergies  Allergen Reactions   Caduet [Amlodipine-Atorvastatin] Hives and Itching   Statins      BLURRED VISION    SURGICAL HISTORY He  has a past surgical history that includes orthopedic surgeries; Laser ablation; Vasectomy; Metatarsal osteotomy with bunionectomy; Cholecystectomy (N/A, 05/23/2018); Multiple tooth extractions; Upper esophageal endoscopic ultrasound (eus) (N/A, 07/23/2018); biopsy (07/23/2018); Esophagogastroduodenoscopy (egd) with propofol (N/A, 07/23/2018); Endoscopic retrograde cholangiopancreatography (ercp) with propofol (N/A, 08/13/2018); biopsy (08/13/2018); sphincterotomy (08/13/2018); and removal of stones (08/13/2018). FAMILY HISTORY His family history includes Cancer in his cousin and paternal uncle; Diabetes in his sister; Heart disease in his father; Hyperlipidemia in his father; Hypertension in his brother; Liver cancer in his cousin; Melanoma (age of onset: 80) in his mother; Multiple endocrine neoplasia in his maternal grandfather, mother, and sister; Skin cancer in his sister and sister; Stomach cancer in his maternal grandfather. SOCIAL HISTORY He  reports that he has quit smoking. He has quit using smokeless tobacco.  His smokeless tobacco use included chew. He reports that he does not drink alcohol and does not use drugs.  Review of Systems:  Review of Systems  Constitutional: Negative.  Negative for chills and fever.  HENT:  Positive for tinnitus. Negative for congestion, ear discharge, ear pain, hearing loss, nosebleeds, sinus pain and sore throat.   Eyes:  Negative for blurred vision and double vision.  Respiratory: Negative.  Negative for cough, hemoptysis, sputum production, shortness of breath, wheezing and stridor.   Cardiovascular:  Negative for chest pain, palpitations, orthopnea, claudication, leg swelling and PND.  Gastrointestinal:  Positive for constipation (uses metamucil). Negative for abdominal pain, diarrhea, heartburn, nausea and vomiting.  Genitourinary:  Negative for dysuria, frequency and urgency.       Gets up once at night to urinate   Musculoskeletal:  Positive for back pain and joint pain (knees). Negative for falls, myalgias and neck pain.  Skin: Negative.  Negative for rash.  Neurological: Negative.  Negative for dizziness, tingling, tremors, weakness and headaches.  Endo/Heme/Allergies:  Does not bruise/bleed easily.  Psychiatric/Behavioral:  Negative for depression, hallucinations, memory loss, substance abuse and suicidal ideas. The patient is not nervous/anxious and does not have insomnia.     Physical Exam: Estimated body mass index is 30.93 kg/m as calculated from the following:   Height as of 05/16/22: '5\' 11"'$  (1.803 m).   Weight as of 05/16/22: 221 lb 12.8 oz (100.6 kg). There were no vitals taken for this visit. General Appearance: Well nourished, in no apparent distress.  Eyes: PERRLA, EOMs, conjunctiva no swelling or erythema, normal fundi and vessels.  Sinuses:  No Frontal/maxillary tenderness  ENT/Mouth: Ext aud canals clear, normal light reflex with TMs without erythema, bulging. Good dentition. No erythema, swelling, or exudate on post pharynx. Tonsils not swollen or erythematous. Hearing normal.  Neck: Supple, thyroid normal. No bruits  Respiratory: Respiratory effort normal, BS equal bilaterally without rales, rhonchi, wheezing or stridor.  Cardio: RRR without murmurs, rubs or gallops. Brisk peripheral pulses without edema.  Chest: symmetric, with normal excursions and percussion.  Abdomen: Soft, nontender, no guarding, rebound, hernias, masses, or organomegaly.  Lymphatics: Non tender without lymphadenopathy.  Musculoskeletal: Full ROM all peripheral extremities,5/5 strength. Antalgic gait, Crepitus of both knees on exam worse on right. Bilateral varicose veins lower legs    Skin: Warm, dry without rashes, lesions, ecchymosis. Neuro: Cranial nerves intact, reflexes equal bilaterally. Normal muscle tone, no cerebellar symptoms. Sensation decreased bilateral feet to ankle. Psych: Awake and oriented X 3,  normal affect, Insight and Judgment appropriate.       Shaquinta Peruski E  1:31 PM [facility]

## 2022-08-31 ENCOUNTER — Encounter: Payer: Self-pay | Admitting: Nurse Practitioner

## 2022-08-31 ENCOUNTER — Ambulatory Visit: Payer: Commercial Managed Care - PPO | Admitting: Nurse Practitioner

## 2022-08-31 VITALS — BP 120/62 | HR 62 | Temp 97.7°F | Wt 211.0 lb

## 2022-08-31 DIAGNOSIS — E1142 Type 2 diabetes mellitus with diabetic polyneuropathy: Secondary | ICD-10-CM | POA: Diagnosis not present

## 2022-08-31 DIAGNOSIS — E1169 Type 2 diabetes mellitus with other specified complication: Secondary | ICD-10-CM

## 2022-08-31 DIAGNOSIS — D352 Benign neoplasm of pituitary gland: Secondary | ICD-10-CM | POA: Diagnosis not present

## 2022-08-31 DIAGNOSIS — M25561 Pain in right knee: Secondary | ICD-10-CM

## 2022-08-31 DIAGNOSIS — E559 Vitamin D deficiency, unspecified: Secondary | ICD-10-CM | POA: Diagnosis not present

## 2022-08-31 DIAGNOSIS — E1122 Type 2 diabetes mellitus with diabetic chronic kidney disease: Secondary | ICD-10-CM | POA: Diagnosis not present

## 2022-08-31 DIAGNOSIS — F5101 Primary insomnia: Secondary | ICD-10-CM

## 2022-08-31 DIAGNOSIS — I10 Essential (primary) hypertension: Secondary | ICD-10-CM

## 2022-08-31 DIAGNOSIS — Z79899 Other long term (current) drug therapy: Secondary | ICD-10-CM

## 2022-08-31 DIAGNOSIS — N182 Chronic kidney disease, stage 2 (mild): Secondary | ICD-10-CM

## 2022-08-31 DIAGNOSIS — E785 Hyperlipidemia, unspecified: Secondary | ICD-10-CM | POA: Diagnosis not present

## 2022-08-31 DIAGNOSIS — F411 Generalized anxiety disorder: Secondary | ICD-10-CM

## 2022-08-31 DIAGNOSIS — G4733 Obstructive sleep apnea (adult) (pediatric): Secondary | ICD-10-CM

## 2022-08-31 NOTE — Patient Instructions (Signed)
Chronic Knee Pain, Adult Knee pain that lasts longer than 3 months is called chronic knee pain. You may have pain in one or both knees. Symptoms of chronic knee pain may also include swelling and stiffness. The most common cause is age-related wear and tear (osteoarthritis) of your knee joint. Many conditions can cause chronic knee pain. Treatment depends on the cause. The main treatments are physical therapy and weight loss. It may also be treated with medicines, injections, a knee sleeve or brace, and by using crutches. Rest, ice, pressure (compression), and elevation, also known as RICE therapy, may also be recommended. Follow these instructions at home: If you have a knee sleeve or brace:  Wear the knee sleeve or brace as told by your doctor. Take it off only as told by your doctor. Loosen it if your toes: Tingle. Become numb. Turn cold and blue. Keep it clean. If the sleeve or brace is not waterproof: Do not let it get wet. Ask your doctor if you may take it off when you take a bath or shower. If not, cover it with a watertight covering. Managing pain, stiffness, and swelling     If told, put heat on your knee. Do this as often as told by your doctor. Use the heat source that your doctor recommends, such as a moist heat pack or a heating pad. If you have a removable knee sleeve or brace, take it off as told by your doctor. Place a towel between your skin and the heat source. Leave the heat on for 20-30 minutes. Take off the heat if your skin turns bright red. This is very important. If you cannot feel pain, heat, or cold, you have a greater risk of getting burned. If told, put ice on your knee. To do this: If you have a removable knee sleeve or brace, take it off as told by your doctor. Put ice in a plastic bag. Place a towel between your skin and the bag. Leave the ice on for 20 minutes, 2-3 times a day. Take off the ice if your skin turns bright red. This is very important. If you  cannot feel pain, heat, or cold, you have a greater risk of damage to the area. Move your toes often. Raise the injured area above the level of your heart while you are sitting or lying down. Activity Avoid activities where both feet leave the ground at the same time (high-impact activities). Examples are running, jumping rope, and doing jumping jacks. Follow the exercise plan that your doctor makes for you. Your doctor may suggest that you: Avoid activities that make knee pain worse. You may need to change the exercises that you do, the sports that you participate in, or your job duties. Wear shoes with cushioned soles. Avoid sports that require running and sudden changes in direction. Do exercises or physical therapy. This is planned to match your needs and your abilities. Do exercises that increase your balance and strength, such as tai chi and yoga. Do not use your injured knee to support your body weight until your doctor says that you can. Use crutches as told by your doctor. Return to your normal activities when your doctor says that it is safe. General instructions Take over-the-counter and prescription medicines only as told by your doctor. If you are overweight, work with your doctor and a food expert (dietitian) to set goals to lose weight. Being overweight can make your knee hurt more. Do not smoke or use any   products that contain nicotine or tobacco. If you need help quitting, ask your doctor. Keep all follow-up visits. Contact a doctor if: You have knee pain that is not getting better or gets worse. You are not able to do your exercises due to knee pain. Get help right away if: Your knee swells and the swelling gets worse. You cannot move your knee. You have very bad knee pain. Summary Knee pain that lasts more than 3 months is called chronic knee pain. The main treatments for chronic knee pain are physical therapy and weight loss. You may also need to take medicines, wear a  knee sleeve or brace, use crutches, and put ice or heat on your knee. Lose weight if you are overweight. Work with your doctor and a food expert (dietitian) to help you set goals to lose weight. Being overweight can make your knee hurt more. Follow the exercise plan that your doctor makes for you. This information is not intended to replace advice given to you by your health care provider. Make sure you discuss any questions you have with your health care provider. Document Revised: 12/09/2019 Document Reviewed: 12/09/2019 [employer]vier Patient Education  2023 [employer]  

## 2022-09-01 LAB — COMPLETE METABOLIC PANEL WITH GFR
AG Ratio: 1.6 (calc) (ref 1.0–2.5)
ALT: 13 U/L (ref 9–46)
AST: 16 U/L (ref 10–35)
Albumin: 4.3 g/dL (ref 3.6–5.1)
Alkaline phosphatase (APISO): 51 U/L (ref 35–144)
BUN: 21 mg/dL (ref 7–25)
CO2: 25 mmol/L (ref 20–32)
Calcium: 9.4 mg/dL (ref 8.6–10.3)
Chloride: 104 mmol/L (ref 98–110)
Creat: 1.01 mg/dL (ref 0.70–1.35)
Globulin: 2.7 g/dL (calc) (ref 1.9–3.7)
Glucose, Bld: 134 mg/dL — ABNORMAL HIGH (ref 65–99)
Potassium: 4.1 mmol/L (ref 3.5–5.3)
Sodium: 139 mmol/L (ref 135–146)
Total Bilirubin: 0.6 mg/dL (ref 0.2–1.2)
Total Protein: 7 g/dL (ref 6.1–8.1)
eGFR: 82 mL/min/{1.73_m2} (ref >=60)

## 2022-09-01 LAB — LIPID PANEL
Cholesterol: 213 mg/dL — ABNORMAL HIGH (ref <200)
HDL: 37 mg/dL — ABNORMAL LOW (ref >=40)
LDL Cholesterol (Calc): 149 mg/dL (calc) — ABNORMAL HIGH
Non-HDL Cholesterol (Calc): 176 mg/dL (calc) — ABNORMAL HIGH (ref <130)
Total CHOL/HDL Ratio: 5.8 (calc) — ABNORMAL HIGH (ref <5.0)
Triglycerides: 147 mg/dL (ref <150)

## 2022-09-01 LAB — HEMOGLOBIN A1C
Hgb A1c MFr Bld: 5.7 % of total Hgb — ABNORMAL HIGH (ref <5.7)
Mean Plasma Glucose: 117 mg/dL
eAG (mmol/L): 6.5 mmol/L

## 2022-09-01 LAB — CBC WITH DIFFERENTIAL/PLATELET
Absolute Monocytes: 371 cells/uL (ref 200–950)
Basophils Absolute: 21 cells/uL (ref 0–200)
Basophils Relative: 0.4 %
Eosinophils Absolute: 355 cells/uL (ref 15–500)
Eosinophils Relative: 6.7 %
HCT: 35.9 % — ABNORMAL LOW (ref 38.5–50.0)
Hemoglobin: 12.3 g/dL — ABNORMAL LOW (ref 13.2–17.1)
Lymphs Abs: 2263 cells/uL (ref 850–3900)
MCH: 28.8 pg (ref 27.0–33.0)
MCHC: 34.3 g/dL (ref 32.0–36.0)
MCV: 84.1 fL (ref 80.0–100.0)
MPV: 10.2 fL (ref 7.5–12.5)
Monocytes Relative: 7 %
Neutro Abs: 2290 cells/uL (ref 1500–7800)
Neutrophils Relative %: 43.2 %
Platelets: 201 10*3/uL (ref 140–400)
RBC: 4.27 10*6/uL (ref 4.20–5.80)
RDW: 12.9 % (ref 11.0–15.0)
Total Lymphocyte: 42.7 %
WBC: 5.3 10*3/uL (ref 3.8–10.8)

## 2022-09-24 ENCOUNTER — Other Ambulatory Visit (HOSPITAL_COMMUNITY): Payer: Self-pay

## 2022-09-26 DIAGNOSIS — L989 Disorder of the skin and subcutaneous tissue, unspecified: Secondary | ICD-10-CM | POA: Diagnosis not present

## 2022-09-26 DIAGNOSIS — C4442 Squamous cell carcinoma of skin of scalp and neck: Secondary | ICD-10-CM | POA: Diagnosis not present

## 2022-09-26 DIAGNOSIS — D485 Neoplasm of uncertain behavior of skin: Secondary | ICD-10-CM | POA: Diagnosis not present

## 2022-09-26 DIAGNOSIS — C44391 Other specified malignant neoplasm of skin of nose: Secondary | ICD-10-CM | POA: Diagnosis not present

## 2022-09-26 DIAGNOSIS — C44329 Squamous cell carcinoma of skin of other parts of face: Secondary | ICD-10-CM | POA: Diagnosis not present

## 2022-09-27 ENCOUNTER — Other Ambulatory Visit (HOSPITAL_COMMUNITY): Payer: Self-pay

## 2022-10-02 ENCOUNTER — Other Ambulatory Visit (HOSPITAL_BASED_OUTPATIENT_CLINIC_OR_DEPARTMENT_OTHER): Payer: Self-pay

## 2022-10-22 ENCOUNTER — Encounter: Payer: Self-pay | Admitting: Internal Medicine

## 2022-10-22 ENCOUNTER — Ambulatory Visit: Payer: Commercial Managed Care - PPO | Admitting: Internal Medicine

## 2022-10-22 VITALS — BP 136/84 | HR 76 | Ht 71.0 in | Wt 210.0 lb

## 2022-10-22 DIAGNOSIS — D352 Benign neoplasm of pituitary gland: Secondary | ICD-10-CM | POA: Diagnosis not present

## 2022-10-22 DIAGNOSIS — R7989 Other specified abnormal findings of blood chemistry: Secondary | ICD-10-CM

## 2022-10-22 NOTE — Progress Notes (Signed)
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 male with a past medical history of T2DM, HTN and OSA on CPAP . The patient presented for initial endocrinology clinic visit on 10/22/2022 for consultative assistance with his Pituitary Macroadenoma .   During evaluation for neurological deficit 03/2021, he was noted to have a pituitary  adenoma 1.1 cm on brain imgaing   Repeat brain MRI showed regression of pituitary adenoma to 8x6 mm   09/2021  Hormonal work up was normal 2022 Has a hx of testosterone intake  Saw neurosurgery - Dr. Danielle Dess   No hx of pancreatitis  No personal  or FH of prostate cancer  No hx of CAD   Mother and sister with with MEN I , genetic testing was - 12/2021   SUBJECTIVE:    Today (10/22/22): Glenn Stephens is here for follow-up on pituitary macroadenoma.  He is accompanied by his spouse today   His last eye exam 06/2022-no visual field complications  He had labs done through his PCP which showed low testosterone at 172 NG/DL, this was done through [facility]  Headaches are stable  Denies galactorrhea  Denies erectile dysfunction  Denies local neck swelling  Denies     HISTORY:  Past Medical History:  Past Medical History:  Diagnosis Date   Acute calculous cholecystitis s/p lap cholecystectopmy 05/23/2018 05/22/2018   Cancer    skin cancer - basal cell   Diabetes mellitus without complication    Full dentures    GERD (gastroesophageal reflux disease)    Heart murmur    no problems per pt   Hernia, abdominal    Hyperlipidemia    Hypertension    OSA (obstructive sleep apnea) 08/03/2013   pt does not use CPAP   Pneumonia    as a small child only   Superficial thrombophlebitis of left upper extremity  06/18/2018   Unspecified vitamin D deficiency    Wears glasses    Past Surgical History:  Past Surgical History:  Procedure Laterality Date   BIOPSY  07/23/2018   Procedure: BIOPSY;  Surgeon: Lemar Lofty., MD;  Location: [facility] ENDOSCOPY;  Service: Gastroenterology;;   BIOPSY  08/13/2018   Procedure: BIOPSY;  Surgeon: Lemar Lofty., MD;  Location: [facility] ENDOSCOPY;  Service: Gastroenterology;;   CHOLECYSTECTOMY N/A 05/23/2018   Procedure: LAPAROSCOPIC CHOLECYSTECTOMY;  Surgeon: Berna Bue, MD;  Location: [facility] OR;  Service: General;  Laterality: N/A;   ENDOSCOPIC RETROGRADE CHOLANGIOPANCREATOGRAPHY (ERCP) WITH PROPOFOL N/A 08/13/2018   Procedure: ENDOSCOPIC RETROGRADE CHOLANGIOPANCREATOGRAPHY (ERCP) WITH PROPOFOL;  Surgeon: Lemar Lofty., MD;  Location: [facility] ENDOSCOPY;  Service: Gastroenterology;  Laterality: N/A;   ESOPHAGOGASTRODUODENOSCOPY (EGD) WITH PROPOFOL N/A 07/23/2018   Procedure: ESOPHAGOGASTRODUODENOSCOPY (EGD) WITH PROPOFOL;  Surgeon: Meridee Score Netty Starring., MD;  Location: [facility] ENDOSCOPY;  Service: Gastroenterology;  Laterality: N/A;   LASER ABLATION     veins   METATARSAL OSTEOTOMY WITH BUNIONECTOMY     MULTIPLE TOOTH EXTRACTIONS     orthopedic surgeries     knee,foot   REMOVAL OF STONES  08/13/2018   Procedure: REMOVAL OF STONES;  Surgeon: Meridee Score Netty Starring., MD;  Location: [facility] ENDOSCOPY;  Service: Gastroenterology;;   Dennison Mascot  08/13/2018   Procedure: SPHINCTEROTOMY;  Surgeon: Lemar Lofty., MD;  Location: [facility] ENDOSCOPY;  Service: Gastroenterology;;   UPPER ESOPHAGEAL ENDOSCOPIC ULTRASOUND (EUS) N/A 07/23/2018   Procedure: UPPER ESOPHAGEAL ENDOSCOPIC ULTRASOUND (EUS);  Surgeon: Lemar Lofty., MD;  Location: [facility] ENDOSCOPY;  Service: Gastroenterology;  Laterality: N/A;   VASECTOMY      Social History:  reports that he has quit smoking. He has quit using smokeless tobacco.  His smokeless tobacco use included chew. He reports that he  does not drink alcohol and does not use drugs. Family History: family history includes Cancer in his cousin and paternal uncle; Diabetes in his sister; Heart disease in his father; Hyperlipidemia in his father; Hypertension in his brother; Liver cancer in his cousin; Melanoma (age of onset: 93) in his mother; Multiple endocrine neoplasia in his maternal grandfather, mother, and sister; Skin cancer in his sister and sister; Stomach cancer in his maternal grandfather.   HOME MEDICATIONS: Allergies as of 10/22/2022       Reactions   Caduet [amlodipine-atorvastatin] Hives, Itching   Statins    BLURRED VISION        Medication List        Accurate as of October 22, 2022  9:28 AM. If you have any questions, ask your nurse or doctor.          STOP taking these medications    clonazePAM 1 MG tablet Commonly known as: KLONOPIN Stopped by: Scarlette Shorts, MD       TAKE these medications    aspirin 81 MG tablet Take 81 mg by mouth daily.   blood glucose meter kit and supplies Test sugars once dailyDispense based insurance preference. E11.9   citalopram 40 MG tablet Commonly known as: CELEXA Take 1 tablet (40 mg total) by mouth daily.   freestyle lancets USE TO TEST BLOOD GLUCOSE ONCE A DAY   FREESTYLE LITE test strip Generic drug: glucose blood Check  Blood Sugar  2 times daily   gabapentin 300 MG capsule Commonly known as: NEURONTIN Take 3 capsules (900 mg total) by mouth every night for chronic pain or sleep   losartan 25 MG tablet Commonly known as: Cozaar Take 1 tablet (25 mg total) by mouth daily.   MAGNESIUM PO Take by mouth daily.   meloxicam 7.5 MG tablet Commonly known as: MOBIC Take 1 tablet daily as needed, try to limit to 5 days a week   omeprazole 20 MG capsule Commonly known as: PRILOSEC Take 1 capsule (20 mg total) by mouth daily.   Ozempic (2 MG/DOSE) 8 MG/3ML Sopn Generic drug: Semaglutide (2 MG/DOSE) Inject 2 mg into the skin once a  week.   VITAMIN B-12 PO Take by mouth daily.   VITAMIN D (CHOLECALCIFEROL) PO Take by mouth daily.   Zinc 50 MG Tabs Take by mouth daily.          REVIEW OF SYSTEMS: A comprehensive ROS was conducted with the patient and is negative except as per HPI    OBJECTIVE:  VS: BP 136/84 (BP Location: Left Arm, Patient Position: Sitting, Cuff Size: Large)   Pulse 76   Ht 5\' 11"  (1.803 m)   Wt 210 lb (95.3 kg)   SpO2 98%   BMI 29.29 kg/m    Wt Readings from Last 3 Encounters:  10/22/22 210 lb (95.3 kg)  08/31/22 211 lb (95.7 kg)  05/16/22 221 lb 12.8 oz (100.6 kg)    EXAM: General: Pt appears well and is in NAD  Eyes: External eye exam  normal without stare, lid lag or exophthalmos.  EOM intact.    Neck: General: Supple without adenopathy. Thyroid: Thyroid size normal.  No goiter or nodules appreciated  Lungs: Clear with good BS bilat with no rales, rhonchi, or wheezes  Heart: Auscultation: RRR.  Abdomen: Normoactive bowel sounds, soft, nontender, without masses or organomegaly palpable  Extremities:  BL LE: No pretibial edema normal ROM and strength.  Mental Status: Judgment, insight: Intact Orientation: Oriented to time, place, and person Mood and affect: No depression, anxiety, or agitation     DATA REVIEWED:   Latest Reference Range & Units 10/25/21 09:55  Sodium 135 - 145 mEq/L 138  Potassium 3.5 - 5.1 mEq/L 4.4  Chloride 96 - 112 mEq/L 102  CO2 19 - 32 mEq/L 28  Glucose 70 - 99 mg/dL 914 (H)  BUN 6 - 23 mg/dL 22  Creatinine 7.82 - 9.56 mg/dL 2.13  Calcium 8.4 - 08.6 mg/dL 9.4  Alkaline Phosphatase 39 - 117 U/L 48  Albumin 3.5 - 5.2 g/dL 4.4  AST 0 - 37 U/L 22  ALT 0 - 53 U/L 30  Total Protein 6.0 - 8.3 g/dL 7.1  Total Bilirubin 0.2 - 1.2 mg/dL 0.9  GFR >57.84 mL/min 70.53    Latest Reference Range & Units 10/25/21 09:55  VITD 30.00 - 100.00 ng/mL 26.63 (L)    Latest Reference Range & Units 10/25/21 09:55  Cortisol, Plasma ug/dL 9.0  Prolactin 2.0  - 69.6 ng/mL 5.5  Glucose 70 - 99 mg/dL 295 (H)        MRI 2/84/1324 Brain: Regression/collapse of pituitary mass eccentric to the left which measures 8 x 6 mm on coronal images. Extension towards the left cavernous sinus but without invasion. Clear suprasellar cistern and normal chiasm. No incidental infarct, hemorrhage, hydrocephalus, or collection. Few remote white matter insults   IMPRESSION: Interval regression of pituitary adenoma, now measuring 8 x 6 mm.Suspect the decrease is related to regression of hemorrhage on prior. No mass effect on adjacent structures.    ASSESSMENT/PLAN/RECOMMENDATIONS:   Pituitary Macroadenoma :  - There's no clinical suspicion for hypo or hyperpituitarism at this time  - TFT, Prolactin, LH, FSH and cortisol , testosterone,  ACTH, and IGF have been normal in the past -He was diagnosed with pituitary macroadenoma adenoma 1.3 cm on CT imaging 02/2021, MRI 03/2021 was 1.1 cm, repeat brain imaging through MRI showed decrease in size to 8 mm, suspect the decrease is related to progression of hemorrhage from prior imaging, there is extension to the left cavernous sinus but no invasion.    2.  Low testosterone:  -This was checked by his PCP due to complaints of fatigue -We have discussed the importance of checking testosterone no later than 8 AM, in the fasting status, using liquid chromatography techniques for better accuracy -In the past testosterone has been within normal range -Patient has no erectile dysfunction  F/U in 1 year  Signed electronically by: Lyndle Herrlich, MD  [facility] Endocrinology  [facility] [address] [place] [place], [place] [address] Phone: [phone] FAX: [phone]   CC: Lucky Cowboy, MD [address] [place] [address] Phone: [phone] Fax: [phone]   Return to Endocrinology clinic as below: Future Appointments  Date Time Provider Department Center   10/22/2022  9:30 AM Carsyn Taubman, [name], MD [facility] None  12/11/2022  9:30 AM [name], NP [facility] None  05/20/2023 10:00 AM [name], NP [facility] None

## 2022-10-23 ENCOUNTER — Other Ambulatory Visit (INDEPENDENT_AMBULATORY_CARE_PROVIDER_SITE_OTHER): Payer: Commercial Managed Care - PPO

## 2022-10-23 DIAGNOSIS — D352 Benign neoplasm of pituitary gland: Secondary | ICD-10-CM | POA: Diagnosis not present

## 2022-10-23 LAB — PROLACTIN: Prolactin: 5.6 ng/mL (ref 2.0–18.0)

## 2022-10-23 LAB — T4, FREE: Free T4: 0.74 ng/dL (ref 0.60–1.60)

## 2022-10-23 LAB — CORTISOL: Cortisol, Plasma: 8.5 ug/dL

## 2022-10-23 LAB — LUTEINIZING HORMONE: LH: 4.37 m[IU]/mL (ref 1.50–9.30)

## 2022-10-23 LAB — TSH: TSH: 1.32 u[IU]/mL (ref 0.35–5.50)

## 2022-10-26 ENCOUNTER — Encounter: Payer: Self-pay | Admitting: Internal Medicine

## 2022-10-26 LAB — ACTH: C206 ACTH: 23 pg/mL (ref 6–50)

## 2022-10-26 LAB — TESTOSTERONE, TOTAL, LC/MS/MS: Testosterone, Total, LC-MS-MS: 238 ng/dL — ABNORMAL LOW (ref 250–1100)

## 2022-10-30 ENCOUNTER — Other Ambulatory Visit (HOSPITAL_COMMUNITY): Payer: Self-pay

## 2022-10-30 DIAGNOSIS — D485 Neoplasm of uncertain behavior of skin: Secondary | ICD-10-CM | POA: Diagnosis not present

## 2022-10-30 DIAGNOSIS — C44329 Squamous cell carcinoma of skin of other parts of face: Secondary | ICD-10-CM | POA: Diagnosis not present

## 2022-10-30 DIAGNOSIS — D0439 Carcinoma in situ of skin of other parts of face: Secondary | ICD-10-CM | POA: Diagnosis not present

## 2022-10-30 MED ORDER — FLUOROURACIL 5 % EX CREA
TOPICAL_CREAM | Freq: Two times a day (BID) | CUTANEOUS | 0 refills | Status: DC
  Filled 2022-10-30: qty 40, 30d supply, fill #0

## 2022-11-01 ENCOUNTER — Other Ambulatory Visit (HOSPITAL_COMMUNITY): Payer: Self-pay

## 2022-11-01 MED ORDER — CLOMIPHENE CITRATE 50 MG PO TABS
50.0000 mg | ORAL_TABLET | ORAL | 2 refills | Status: DC
  Filled 2022-11-01: qty 48, 84d supply, fill #0

## 2022-11-02 ENCOUNTER — Other Ambulatory Visit: Payer: Self-pay

## 2022-11-13 ENCOUNTER — Other Ambulatory Visit (HOSPITAL_COMMUNITY): Payer: Self-pay

## 2022-11-13 DIAGNOSIS — C44329 Squamous cell carcinoma of skin of other parts of face: Secondary | ICD-10-CM | POA: Diagnosis not present

## 2022-11-19 DIAGNOSIS — D485 Neoplasm of uncertain behavior of skin: Secondary | ICD-10-CM | POA: Diagnosis not present

## 2022-11-19 DIAGNOSIS — D044 Carcinoma in situ of skin of scalp and neck: Secondary | ICD-10-CM | POA: Diagnosis not present

## 2022-11-19 DIAGNOSIS — C44629 Squamous cell carcinoma of skin of left upper limb, including shoulder: Secondary | ICD-10-CM | POA: Diagnosis not present

## 2022-11-30 ENCOUNTER — Other Ambulatory Visit (HOSPITAL_COMMUNITY): Payer: Self-pay

## 2022-12-10 ENCOUNTER — Other Ambulatory Visit (HOSPITAL_COMMUNITY): Payer: Self-pay

## 2022-12-10 ENCOUNTER — Other Ambulatory Visit: Payer: Self-pay | Admitting: Nurse Practitioner

## 2022-12-10 MED ORDER — GABAPENTIN 300 MG PO CAPS
900.0000 mg | ORAL_CAPSULE | Freq: Every evening | ORAL | 1 refills | Status: DC
  Filled 2022-12-10 (×2): qty 270, 90d supply, fill #0
  Filled 2023-03-12: qty 270, 90d supply, fill #1

## 2022-12-11 ENCOUNTER — Ambulatory Visit: Payer: Commercial Managed Care - PPO | Admitting: Nurse Practitioner

## 2022-12-11 DIAGNOSIS — C44629 Squamous cell carcinoma of skin of left upper limb, including shoulder: Secondary | ICD-10-CM | POA: Diagnosis not present

## 2022-12-11 DIAGNOSIS — C4442 Squamous cell carcinoma of skin of scalp and neck: Secondary | ICD-10-CM | POA: Diagnosis not present

## 2022-12-14 ENCOUNTER — Other Ambulatory Visit (HOSPITAL_COMMUNITY): Payer: Self-pay

## 2022-12-18 NOTE — Progress Notes (Unsigned)
 3 MONTH FOLLOW UP    Assessment and Plan:  Glenn Stephens was seen today for annual exam.  Diagnoses and all orders for this visit:  Type 2 diabetes mellitus with stage 2 chronic kidney disease, without long-term current use of insulin (HCC) Continue medications:Ozempic 2 mg QW Discussed general issues about diabetes pathophysiology and management. Education: Reviewed 'ABCs' of diabetes management (respective goals in parentheses):  A1C (<7), blood pressure (<130/80), and cholesterol (LDL <70) Dietary recommendations Encouraged aerobic exercise.  Discussed foot care, check daily Yearly retinal exam- scheduled for May Dental exam every 6 months Monitor blood glucose, discussed goal for patient -     Hemoglobin A1c   Essential hypertension Continue current medications: Losartan 50mg  Monitor blood pressure at home; call if consistently over 130/80 Continue DASH diet.   Reminder to go to the ER if any CP, SOB, nausea, dizziness, severe HA, changes vision/speech, left arm numbness and tingling and jaw pain. -     CBC with Differential/Platelet -     COMPLETE METABOLIC PANEL WITH GFR   Hyperlipidemia, unspecified hyperlipidemia type/Statin myopathy Unable to take statins or Zetia due to myopathys Refer to lipid clinic for evaluation Discussed dietary and exercise modifications Low fat diet -     Lipid panel   Vitamin D deficiency Continue supplementation to maintain goal of 70-100 Taking Vitamin D5,000 IU daily   Diabetic peripheral neuropathy associated with type 2 diabetes mellitus (HCC) Doing well at this time Checks feet daily Gabapentin 300mg   Generalized anxiety disorder Citalopram 40mg  daily Stopped Klonopin and has noticed his personality is much improved  Overweight Discussed dietary and exercise modifications  Primary insomnia Doing well Gabapentin 300mg  nighty  Medication management Continued  Vitamin D deficiency Continue Vit D  supplementation   Pituitary Microadenoma Continue to follow with [name]  Get visual fields tested at ophthalmologist- has appt schedule for May Saw [name] and is to have another MRI in 6 months     Discussed med's effects and SE's. Screening labs and tests as requested with regular follow-up as recommended. Over 40 minutes of face to face interview,  exam, counseling, chart review and critical decision making was performed  Future Appointments  Date Time Provider Department Center  12/19/2022 11:30 AM [name], NP [facility] None  05/20/2023 10:00 AM [name], NP [facility] None  10/22/2023  9:10 AM [name], [name], MD [facility] None    HPI Patient presents for a complete physical.  67 y.o. male following up on HTN, HLD, DMII, GERD, weight and vitamin D defciency.  Pituitary adenoma has spiked to the side and he has seen Dr Maurice Small. He advised it had shrunk, the spidering were not concerned.  Will have another MRI in 6 months. Occasional blurred vision and has appointment with eye doctor 11/2021. Continues to have some fuzziness in vision   His blood pressure has not monitored at home, today their BP is    BP Readings from Last 3 Encounters:  10/22/22 136/84  08/31/22 120/62  05/16/22 132/80    BMI is There is no height or weight on file to calculate BMI., he has not been working on diet and exercise. No other exercise than work.  Wt Readings from Last 3 Encounters:  10/22/22 210 lb (95.3 kg)  08/31/22 211 lb (95.7 kg)  05/16/22 221 lb 12.8 oz (100.6 kg)    He does not workout, but stays active,working for , works 3+ days a week, lots of walking, retired [employer]  de[employer]. Avg 5-7 miles a day when works  He denies chest pain, shortness of breath, dizziness.  He is on celexa for anxiety  He is not taking any medication for cholesterol currently.He previously was on Rosuvastatin but made his knees hurt.  Tried Zetia and it  hurt his joints.  His cholesterol is not at goal less than 70. The cholesterol last visit was:   Lab Results  Component Value Date   CHOL 213 (H) 08/31/2022   HDL 37 (L) 08/31/2022   LDLCALC 149 (H) 08/31/2022   TRIG 147 08/31/2022   CHOLHDL 5.8 (H) 08/31/2022     He has been working on diet and exercise for diabetes  with CKD is on ACE/ARB With hyperlipidemia he was on crestor 5mg  and zetia 10mg  but has stopped both he stopped his synjardy due to severe diarrhea and is only taking Ozempic  he is on bASA Has not been checking blood sugars regularly. Blood sugar last check 108.  Eye Exam: [name] 12/08/21 and denies paresthesia of the feet, polydipsia, polyuria and visual disturbances.  Last A1C in the office was:  Lab Results  Component Value Date   HGBA1C 5.7 (H) 08/31/2022   Lab Results  Component Value Date   EGFR 82 08/31/2022     Patient is on Vitamin D supplement, not on vitamin D.   Lab Results  Component Value Date   VD25OH 31 05/16/2022     Last PSA was: Lab Results  Component Value Date   PSA 0.15 05/16/2022      Current Medications:  Current Outpatient Medications on File Prior to Visit  Medication Sig Dispense Refill   aspirin 81 MG tablet Take 81 mg by mouth daily.     blood glucose meter kit and supplies Test sugars once dailyDispense based insurance preference. E11.9 1 each 0   citalopram (CELEXA) 40 MG tablet Take 1 tablet (40 mg total) by mouth daily. 90 tablet 2   clomiPHENE (CLOMID) 50 MG tablet Take 1 tablet (50 mg total) by mouth as directed, 4 days a week. 48 tablet 2   Cyanocobalamin (VITAMIN B-12 PO) Take by mouth daily.     fluorouracil (EFUDEX) 5 % cream Apply to affected area on left cheek twice daily for 3 weeks 40 g 0   gabapentin (NEURONTIN) 300 MG capsule Take 3 capsules (900 mg total) by mouth every night for chronic pain or sleep 270 capsule 1   glucose blood (FREESTYLE LITE) test strip Check  Blood Sugar  2 times daily 200 strip 3    Lancets (FREESTYLE) lancets USE TO TEST BLOOD GLUCOSE ONCE A DAY 100 each 0   losartan (COZAAR) 25 MG tablet Take 1 tablet (25 mg total) by mouth daily. 30 tablet 11   MAGNESIUM PO Take by mouth daily.     meloxicam (MOBIC) 7.5 MG tablet Take 1 tablet daily as needed, try to limit to 5 days a week 30 tablet 1   omeprazole (PRILOSEC) 20 MG capsule Take 1 capsule (20 mg total) by mouth daily. 90 capsule 2   Semaglutide, 1 MG/DOSE, (OZEMPIC, 1 MG/DOSE,) 4 MG/3ML SOPN Inject 1 mg into the skin once a week.     VITAMIN D, CHOLECALCIFEROL, PO Take by mouth daily.     Zinc 50 MG TABS Take by mouth daily.     No current facility-administered medications on file prior to visit.   Health Maintenance:  Immunization History  Administered Date(s) Administered   Influenza-Unspecified 05/07/2013, 04/22/2020,  04/10/2021, 04/18/2022   Pneumococcal Polysaccharide-23 06/24/2013   Td 07/09/2006   Tetanus: 2016 at work Pneumovax: 2014 Prevnar 13: Flu vaccine: at work 2021 Zostavax/Shingrix: Discussed with patient DEXA: Colonoscopy: 05/2018 ERCP 08/2018 EGD: Sleep study 2017 Eye Exam: [facility] 2021 Dentist: Dentures, fit well.  Patient Care Team: Lucky Cowboy, MD as PCP - General (Internal Medicine) Jeani Hawking, MD as Consulting Physician (Gastroenterology)  Dr. Eden Emms 2007 normal stress test Dr. Hart [place] Dr. Elnoria Howard Dr. Ophelia Charter  Dr. Fonnie Jarvis  Medical History:  Past Medical History:  Diagnosis Date   Acute calculous cholecystitis s/p lap cholecystectopmy 05/23/2018 05/22/2018   Cancer (HCC)    skin cancer - basal cell   Diabetes mellitus without complication (HCC)    Full dentures    GERD (gastroesophageal reflux disease)    Heart murmur    no problems per pt   Hernia, abdominal    Hyperlipidemia    Hypertension    OSA (obstructive sleep apnea) 08/03/2013   pt does not use CPAP   Pneumonia    as a small child only   Superficial thrombophlebitis of left upper extremity  06/18/2018   Unspecified vitamin D deficiency    Wears glasses    Allergies Allergies  Allergen Reactions   Caduet [Amlodipine-Atorvastatin] Hives and Itching   Statins     BLURRED VISION    SURGICAL HISTORY He  has a past surgical history that includes orthopedic surgeries; Laser ablation; Vasectomy; Metatarsal osteotomy with bunionectomy; Cholecystectomy (N/A, 05/23/2018); Multiple tooth extractions; Upper esophageal endoscopic ultrasound (eus) (N/A, 07/23/2018); biopsy (07/23/2018); Esophagogastroduodenoscopy (egd) with propofol (N/A, 07/23/2018); Endoscopic retrograde cholangiopancreatography (ercp) with propofol (N/A, 08/13/2018); biopsy (08/13/2018); sphincterotomy (08/13/2018); and removal of stones (08/13/2018). FAMILY HISTORY His family history includes Cancer in his cousin and paternal uncle; Diabetes in his sister; Heart disease in his father; Hyperlipidemia in his father; Hypertension in his brother; Liver cancer in his cousin; Melanoma (age of onset: 29) in his mother; Multiple endocrine neoplasia in his maternal grandfather, mother, and sister; Skin cancer in his sister and sister; Stomach cancer in his maternal grandfather. SOCIAL HISTORY He  reports that he has quit smoking. He has quit using smokeless tobacco.  His smokeless tobacco use included chew. He reports that he does not drink alcohol and does not use drugs.  Review of Systems:  Review of Systems  Constitutional: Negative.  Negative for chills and fever.  HENT:  Positive for tinnitus. Negative for congestion, ear discharge, ear pain, hearing loss, nosebleeds, sinus pain and sore throat.   Eyes:  Negative for blurred vision and double vision.  Respiratory: Negative.  Negative for cough, hemoptysis, sputum production, shortness of breath, wheezing and stridor.   Cardiovascular:  Negative for chest pain, palpitations, orthopnea, claudication, leg swelling and PND.  Gastrointestinal:  Positive for constipation (uses metamucil).  Negative for abdominal pain, diarrhea, heartburn, nausea and vomiting.  Genitourinary:  Negative for dysuria, frequency and urgency.       Gets up once at night to urinate  Musculoskeletal:  Positive for back pain and joint pain (knees). Negative for falls, myalgias and neck pain.  Skin: Negative.  Negative for rash.  Neurological: Negative.  Negative for dizziness, tingling, tremors, weakness and headaches.  Endo/Heme/Allergies:  Does not bruise/bleed easily.  Psychiatric/Behavioral:  Negative for depression, hallucinations, memory loss, substance abuse and suicidal ideas. The patient is not nervous/anxious and does not have insomnia.     Physical Exam: Estimated body mass index is 29.29 kg/m as calculated from the following:  Height as of 10/22/22: 5\' 11"  (1.803 m).   Weight as of 10/22/22: 210 lb (95.3 kg). There were no vitals taken for this visit. General Appearance: Well nourished, in no apparent distress.  Eyes: PERRLA, EOMs, conjunctiva no swelling or erythema, normal fundi and vessels.  Sinuses: No Frontal/maxillary tenderness  ENT/Mouth: Ext aud canals clear, normal light reflex with TMs without erythema, bulging. Good dentition. No erythema, swelling, or exudate on post pharynx. Tonsils not swollen or erythematous. Hearing normal.  Neck: Supple, thyroid normal. No bruits  Respiratory: Respiratory effort normal, BS equal bilaterally without rales, rhonchi, wheezing or stridor.  Cardio: RRR without murmurs, rubs or gallops. Brisk peripheral pulses without edema.  Chest: symmetric, with normal excursions and percussion.  Abdomen: Soft, nontender, no guarding, rebound, hernias, masses, or organomegaly.  Lymphatics: Non tender without lymphadenopathy.  Musculoskeletal: Full ROM all peripheral extremities,5/5 strength. Antalgic gait, Crepitus of both knees on exam worse on right. Bilateral varicose veins lower legs    Skin: Warm, dry without rashes, lesions, ecchymosis. Neuro: Cranial  nerves intact, reflexes equal bilaterally. Normal muscle tone, no cerebellar symptoms. Sensation decreased bilateral feet to ankle. Psych: Awake and oriented X 3, normal affect, Insight and Judgment appropriate.       Rome Schlauch E  1:01 PM [facility]

## 2022-12-19 ENCOUNTER — Ambulatory Visit: Payer: Commercial Managed Care - PPO | Admitting: Nurse Practitioner

## 2022-12-19 ENCOUNTER — Other Ambulatory Visit (HOSPITAL_COMMUNITY): Payer: Self-pay

## 2022-12-19 ENCOUNTER — Encounter: Payer: Self-pay | Admitting: Nurse Practitioner

## 2022-12-19 VITALS — BP 128/78 | HR 70 | Temp 97.7°F | Ht 71.0 in | Wt 214.4 lb

## 2022-12-19 DIAGNOSIS — Z79899 Other long term (current) drug therapy: Secondary | ICD-10-CM | POA: Diagnosis not present

## 2022-12-19 DIAGNOSIS — Z85828 Personal history of other malignant neoplasm of skin: Secondary | ICD-10-CM

## 2022-12-19 DIAGNOSIS — E559 Vitamin D deficiency, unspecified: Secondary | ICD-10-CM

## 2022-12-19 DIAGNOSIS — E1169 Type 2 diabetes mellitus with other specified complication: Secondary | ICD-10-CM | POA: Diagnosis not present

## 2022-12-19 DIAGNOSIS — I10 Essential (primary) hypertension: Secondary | ICD-10-CM

## 2022-12-19 DIAGNOSIS — F411 Generalized anxiety disorder: Secondary | ICD-10-CM

## 2022-12-19 DIAGNOSIS — D352 Benign neoplasm of pituitary gland: Secondary | ICD-10-CM

## 2022-12-19 DIAGNOSIS — F5101 Primary insomnia: Secondary | ICD-10-CM | POA: Diagnosis not present

## 2022-12-19 DIAGNOSIS — N182 Chronic kidney disease, stage 2 (mild): Secondary | ICD-10-CM

## 2022-12-19 DIAGNOSIS — E785 Hyperlipidemia, unspecified: Secondary | ICD-10-CM

## 2022-12-19 DIAGNOSIS — E1142 Type 2 diabetes mellitus with diabetic polyneuropathy: Secondary | ICD-10-CM | POA: Diagnosis not present

## 2022-12-19 DIAGNOSIS — E1122 Type 2 diabetes mellitus with diabetic chronic kidney disease: Secondary | ICD-10-CM | POA: Diagnosis not present

## 2022-12-19 DIAGNOSIS — G720 Drug-induced myopathy: Secondary | ICD-10-CM | POA: Diagnosis not present

## 2022-12-19 MED ORDER — TRAZODONE HCL 50 MG PO TABS
25.0000 mg | ORAL_TABLET | ORAL | 2 refills | Status: AC
Start: 2022-12-19 — End: ?
  Filled 2022-12-19: qty 30, 30d supply, fill #0
  Filled 2023-01-14: qty 30, 30d supply, fill #1
  Filled 2023-02-13: qty 30, 30d supply, fill #2

## 2022-12-19 MED ORDER — EVOLOCUMAB 140 MG/ML ~~LOC~~ SOAJ
140.0000 mg | Freq: Once | SUBCUTANEOUS | Status: DC
Start: 2022-12-19 — End: 2023-06-11

## 2022-12-19 MED ORDER — REPATHA 140 MG/ML ~~LOC~~ SOSY
140.0000 mg | PREFILLED_SYRINGE | SUBCUTANEOUS | Status: DC
Start: 2022-12-19 — End: 2023-04-17

## 2022-12-19 NOTE — Patient Instructions (Signed)

## 2022-12-20 ENCOUNTER — Other Ambulatory Visit (HOSPITAL_COMMUNITY): Payer: Self-pay

## 2022-12-20 LAB — COMPLETE METABOLIC PANEL WITH GFR
AG Ratio: 1.8 (calc) (ref 1.0–2.5)
ALT: 16 U/L (ref 9–46)
AST: 17 U/L (ref 10–35)
Albumin: 4.2 g/dL (ref 3.6–5.1)
Alkaline phosphatase (APISO): 44 U/L (ref 35–144)
BUN: 15 mg/dL (ref 7–25)
CO2: 28 mmol/L (ref 20–32)
Calcium: 9.3 mg/dL (ref 8.6–10.3)
Chloride: 101 mmol/L (ref 98–110)
Creat: 1.02 mg/dL (ref 0.70–1.35)
Globulin: 2.4 g/dL (calc) (ref 1.9–3.7)
Glucose, Bld: 107 mg/dL — ABNORMAL HIGH (ref 65–99)
Potassium: 4.1 mmol/L (ref 3.5–5.3)
Sodium: 138 mmol/L (ref 135–146)
Total Bilirubin: 0.6 mg/dL (ref 0.2–1.2)
Total Protein: 6.6 g/dL (ref 6.1–8.1)
eGFR: 80 mL/min/{1.73_m2} (ref >=60)

## 2022-12-20 LAB — HEMOGLOBIN A1C
Hgb A1c MFr Bld: 5.5 % of total Hgb (ref <5.7)
Mean Plasma Glucose: 111 mg/dL
eAG (mmol/L): 6.2 mmol/L

## 2022-12-20 LAB — LIPID PANEL
Cholesterol: 228 mg/dL — ABNORMAL HIGH (ref <200)
HDL: 35 mg/dL — ABNORMAL LOW (ref >=40)
LDL Cholesterol (Calc): 159 mg/dL (calc) — ABNORMAL HIGH
Non-HDL Cholesterol (Calc): 193 mg/dL (calc) — ABNORMAL HIGH (ref <130)
Total CHOL/HDL Ratio: 6.5 (calc) — ABNORMAL HIGH (ref <5.0)
Triglycerides: 188 mg/dL — ABNORMAL HIGH (ref <150)

## 2022-12-20 LAB — CBC WITH DIFFERENTIAL/PLATELET
Absolute Monocytes: 363 cells/uL (ref 200–950)
Basophils Absolute: 33 cells/uL (ref 0–200)
Basophils Relative: 0.5 %
Eosinophils Absolute: 271 cells/uL (ref 15–500)
Eosinophils Relative: 4.1 %
HCT: 36.8 % — ABNORMAL LOW (ref 38.5–50.0)
Hemoglobin: 12.6 g/dL — ABNORMAL LOW (ref 13.2–17.1)
Lymphs Abs: 2726 cells/uL (ref 850–3900)
MCH: 29.4 pg (ref 27.0–33.0)
MCHC: 34.2 g/dL (ref 32.0–36.0)
MCV: 85.8 fL (ref 80.0–100.0)
MPV: 9.8 fL (ref 7.5–12.5)
Monocytes Relative: 5.5 %
Neutro Abs: 3208 cells/uL (ref 1500–7800)
Neutrophils Relative %: 48.6 %
Platelets: 209 10*3/uL (ref 140–400)
RBC: 4.29 10*6/uL (ref 4.20–5.80)
RDW: 12.9 % (ref 11.0–15.0)
Total Lymphocyte: 41.3 %
WBC: 6.6 10*3/uL (ref 3.8–10.8)

## 2022-12-21 DIAGNOSIS — D0462 Carcinoma in situ of skin of left upper limb, including shoulder: Secondary | ICD-10-CM | POA: Diagnosis not present

## 2022-12-22 ENCOUNTER — Other Ambulatory Visit: Payer: Self-pay | Admitting: Nurse Practitioner

## 2022-12-22 DIAGNOSIS — M1990 Unspecified osteoarthritis, unspecified site: Secondary | ICD-10-CM

## 2022-12-23 ENCOUNTER — Other Ambulatory Visit (HOSPITAL_COMMUNITY): Payer: Self-pay

## 2022-12-23 MED ORDER — OMEPRAZOLE 20 MG PO CPDR
20.0000 mg | DELAYED_RELEASE_CAPSULE | Freq: Every day | ORAL | 2 refills | Status: DC
  Filled 2022-12-23: qty 90, 90d supply, fill #0
  Filled 2023-03-23: qty 90, 90d supply, fill #1
  Filled 2023-06-19 – 2023-07-02 (×2): qty 90, 90d supply, fill #2

## 2022-12-23 MED ORDER — MELOXICAM 7.5 MG PO TABS
7.5000 mg | ORAL_TABLET | Freq: Every day | ORAL | 1 refills | Status: DC | PRN
Start: 2022-12-23 — End: 2023-02-28
  Filled 2022-12-23: qty 30, 30d supply, fill #0
  Filled 2023-01-24: qty 30, 30d supply, fill #1

## 2022-12-24 ENCOUNTER — Other Ambulatory Visit: Payer: Self-pay | Admitting: Internal Medicine

## 2022-12-24 ENCOUNTER — Other Ambulatory Visit: Payer: Self-pay

## 2022-12-24 MED ORDER — OZEMPIC (1 MG/DOSE) 4 MG/3ML ~~LOC~~ SOPN
1.0000 mg | PEN_INJECTOR | SUBCUTANEOUS | 3 refills | Status: DC
  Filled 2022-12-24 – 2022-12-25 (×2): qty 3, 28d supply, fill #0
  Filled 2023-01-26: qty 3, 28d supply, fill #1
  Filled 2023-01-28: qty 9, 84d supply, fill #1

## 2022-12-25 ENCOUNTER — Other Ambulatory Visit (HOSPITAL_COMMUNITY): Payer: Self-pay

## 2022-12-25 ENCOUNTER — Other Ambulatory Visit: Payer: Self-pay

## 2023-01-02 DIAGNOSIS — C4442 Squamous cell carcinoma of skin of scalp and neck: Secondary | ICD-10-CM | POA: Diagnosis not present

## 2023-01-02 DIAGNOSIS — C44519 Basal cell carcinoma of skin of other part of trunk: Secondary | ICD-10-CM | POA: Diagnosis not present

## 2023-01-16 DIAGNOSIS — C44612 Basal cell carcinoma of skin of right upper limb, including shoulder: Secondary | ICD-10-CM | POA: Diagnosis not present

## 2023-01-16 DIAGNOSIS — C44622 Squamous cell carcinoma of skin of right upper limb, including shoulder: Secondary | ICD-10-CM | POA: Diagnosis not present

## 2023-01-21 ENCOUNTER — Other Ambulatory Visit (HOSPITAL_COMMUNITY): Payer: Self-pay

## 2023-01-24 DIAGNOSIS — C44519 Basal cell carcinoma of skin of other part of trunk: Secondary | ICD-10-CM | POA: Diagnosis not present

## 2023-01-28 ENCOUNTER — Other Ambulatory Visit (HOSPITAL_COMMUNITY): Payer: Self-pay

## 2023-02-01 ENCOUNTER — Other Ambulatory Visit (HOSPITAL_COMMUNITY): Payer: Self-pay

## 2023-02-01 DIAGNOSIS — L578 Other skin changes due to chronic exposure to nonionizing radiation: Secondary | ICD-10-CM | POA: Diagnosis not present

## 2023-02-01 DIAGNOSIS — D485 Neoplasm of uncertain behavior of skin: Secondary | ICD-10-CM | POA: Diagnosis not present

## 2023-02-01 DIAGNOSIS — L570 Actinic keratosis: Secondary | ICD-10-CM | POA: Diagnosis not present

## 2023-02-01 DIAGNOSIS — C4442 Squamous cell carcinoma of skin of scalp and neck: Secondary | ICD-10-CM | POA: Diagnosis not present

## 2023-02-01 DIAGNOSIS — C44221 Squamous cell carcinoma of skin of unspecified ear and external auricular canal: Secondary | ICD-10-CM | POA: Diagnosis not present

## 2023-02-01 MED ORDER — FLUOROURACIL 5 % EX CREA
1.0000 | TOPICAL_CREAM | Freq: Two times a day (BID) | CUTANEOUS | 1 refills | Status: DC
  Filled 2023-02-01: qty 40, 14d supply, fill #0
  Filled 2023-02-13: qty 40, 14d supply, fill #1

## 2023-02-12 DIAGNOSIS — D0461 Carcinoma in situ of skin of right upper limb, including shoulder: Secondary | ICD-10-CM | POA: Diagnosis not present

## 2023-02-13 ENCOUNTER — Encounter: Payer: Self-pay | Admitting: Nurse Practitioner

## 2023-02-14 ENCOUNTER — Other Ambulatory Visit: Payer: Self-pay

## 2023-02-14 ENCOUNTER — Other Ambulatory Visit (HOSPITAL_COMMUNITY): Payer: Self-pay

## 2023-02-14 ENCOUNTER — Other Ambulatory Visit: Payer: Self-pay | Admitting: Nurse Practitioner

## 2023-02-14 DIAGNOSIS — F5101 Primary insomnia: Secondary | ICD-10-CM

## 2023-02-14 MED ORDER — TRAZODONE HCL 50 MG PO TABS
25.0000 mg | ORAL_TABLET | ORAL | 2 refills | Status: DC
Start: 2023-02-14 — End: 2023-11-13
  Filled 2023-02-15: qty 30, 30d supply, fill #0
  Filled 2023-03-16: qty 30, 30d supply, fill #1
  Filled 2023-04-18: qty 30, 30d supply, fill #2
  Filled 2023-05-19: qty 30, 30d supply, fill #3
  Filled 2023-06-18: qty 30, 30d supply, fill #4
  Filled 2023-07-23: qty 30, 30d supply, fill #5
  Filled 2023-08-16: qty 30, 30d supply, fill #6
  Filled 2023-09-18: qty 30, 30d supply, fill #7
  Filled 2023-10-18: qty 30, 30d supply, fill #8

## 2023-02-15 ENCOUNTER — Other Ambulatory Visit (HOSPITAL_COMMUNITY): Payer: Self-pay

## 2023-02-21 DIAGNOSIS — C44612 Basal cell carcinoma of skin of right upper limb, including shoulder: Secondary | ICD-10-CM | POA: Diagnosis not present

## 2023-02-22 ENCOUNTER — Other Ambulatory Visit (HOSPITAL_COMMUNITY): Payer: Self-pay

## 2023-02-25 DIAGNOSIS — C44222 Squamous cell carcinoma of skin of right ear and external auricular canal: Secondary | ICD-10-CM | POA: Diagnosis not present

## 2023-02-28 ENCOUNTER — Other Ambulatory Visit (HOSPITAL_COMMUNITY): Payer: Self-pay

## 2023-02-28 ENCOUNTER — Other Ambulatory Visit: Payer: Self-pay | Admitting: Nurse Practitioner

## 2023-02-28 ENCOUNTER — Other Ambulatory Visit: Payer: Self-pay

## 2023-02-28 DIAGNOSIS — M1990 Unspecified osteoarthritis, unspecified site: Secondary | ICD-10-CM

## 2023-02-28 MED ORDER — MELOXICAM 7.5 MG PO TABS
7.5000 mg | ORAL_TABLET | Freq: Every day | ORAL | 1 refills | Status: DC | PRN
Start: 2023-02-28 — End: 2023-06-06
  Filled 2023-02-28: qty 30, 30d supply, fill #0
  Filled 2023-03-16 – 2023-03-23 (×2): qty 30, 30d supply, fill #1

## 2023-02-28 MED ORDER — OZEMPIC (1 MG/DOSE) 4 MG/3ML ~~LOC~~ SOPN
1.0000 mg | PEN_INJECTOR | SUBCUTANEOUS | 3 refills | Status: DC
  Filled 2023-02-28 – 2023-03-01 (×2): qty 3, 28d supply, fill #0
  Filled 2023-04-17: qty 9, 84d supply, fill #0
  Filled 2023-07-12: qty 3, 28d supply, fill #1
  Filled 2023-07-12: qty 9, 84d supply, fill #1

## 2023-03-01 ENCOUNTER — Other Ambulatory Visit (HOSPITAL_COMMUNITY): Payer: Self-pay

## 2023-03-10 ENCOUNTER — Other Ambulatory Visit: Payer: Self-pay | Admitting: Nurse Practitioner

## 2023-03-12 ENCOUNTER — Other Ambulatory Visit (HOSPITAL_COMMUNITY): Payer: Self-pay

## 2023-03-12 MED ORDER — CITALOPRAM HYDROBROMIDE 40 MG PO TABS
40.0000 mg | ORAL_TABLET | Freq: Every day | ORAL | 2 refills | Status: DC
  Filled 2023-03-12: qty 90, 90d supply, fill #0
  Filled 2023-06-11: qty 90, 90d supply, fill #1
  Filled 2023-08-28: qty 90, 90d supply, fill #2

## 2023-03-17 ENCOUNTER — Other Ambulatory Visit (HOSPITAL_COMMUNITY): Payer: Self-pay

## 2023-03-18 ENCOUNTER — Other Ambulatory Visit (HOSPITAL_COMMUNITY): Payer: Self-pay

## 2023-03-18 ENCOUNTER — Other Ambulatory Visit: Payer: Self-pay

## 2023-03-21 ENCOUNTER — Ambulatory Visit: Payer: Commercial Managed Care - PPO | Admitting: Nurse Practitioner

## 2023-03-24 ENCOUNTER — Other Ambulatory Visit (HOSPITAL_COMMUNITY): Payer: Self-pay

## 2023-03-25 ENCOUNTER — Other Ambulatory Visit (HOSPITAL_COMMUNITY): Payer: Self-pay

## 2023-03-25 ENCOUNTER — Other Ambulatory Visit: Payer: Self-pay

## 2023-03-26 ENCOUNTER — Other Ambulatory Visit (HOSPITAL_COMMUNITY): Payer: Self-pay

## 2023-04-10 ENCOUNTER — Other Ambulatory Visit (HOSPITAL_COMMUNITY): Payer: Self-pay

## 2023-04-10 DIAGNOSIS — D485 Neoplasm of uncertain behavior of skin: Secondary | ICD-10-CM | POA: Diagnosis not present

## 2023-04-10 DIAGNOSIS — Z08 Encounter for follow-up examination after completed treatment for malignant neoplasm: Secondary | ICD-10-CM | POA: Diagnosis not present

## 2023-04-10 DIAGNOSIS — D0439 Carcinoma in situ of skin of other parts of face: Secondary | ICD-10-CM | POA: Diagnosis not present

## 2023-04-10 DIAGNOSIS — L578 Other skin changes due to chronic exposure to nonionizing radiation: Secondary | ICD-10-CM | POA: Diagnosis not present

## 2023-04-10 DIAGNOSIS — D0461 Carcinoma in situ of skin of right upper limb, including shoulder: Secondary | ICD-10-CM | POA: Diagnosis not present

## 2023-04-10 DIAGNOSIS — Z85828 Personal history of other malignant neoplasm of skin: Secondary | ICD-10-CM | POA: Diagnosis not present

## 2023-04-10 DIAGNOSIS — R229 Localized swelling, mass and lump, unspecified: Secondary | ICD-10-CM | POA: Diagnosis not present

## 2023-04-10 DIAGNOSIS — L570 Actinic keratosis: Secondary | ICD-10-CM | POA: Diagnosis not present

## 2023-04-10 DIAGNOSIS — C44529 Squamous cell carcinoma of skin of other part of trunk: Secondary | ICD-10-CM | POA: Diagnosis not present

## 2023-04-10 DIAGNOSIS — D225 Melanocytic nevi of trunk: Secondary | ICD-10-CM | POA: Diagnosis not present

## 2023-04-10 DIAGNOSIS — C44622 Squamous cell carcinoma of skin of right upper limb, including shoulder: Secondary | ICD-10-CM | POA: Diagnosis not present

## 2023-04-10 DIAGNOSIS — L821 Other seborrheic keratosis: Secondary | ICD-10-CM | POA: Diagnosis not present

## 2023-04-10 MED ORDER — FLUOROURACIL 5 % EX CREA
1.0000 | TOPICAL_CREAM | Freq: Two times a day (BID) | CUTANEOUS | 1 refills | Status: AC
  Filled 2023-04-10: qty 40, 14d supply, fill #0
  Filled 2023-09-20 – 2023-10-09 (×2): qty 40, 14d supply, fill #1

## 2023-04-16 NOTE — Progress Notes (Unsigned)
 3 MONTH FOLLOW UP    Assessment and Plan:  Glenn Stephens was seen today for annual exam.  Diagnoses and all orders for this visit:  Type 2 diabetes mellitus with stage 2 chronic kidney disease, without long-term current use of insulin (HCC) Continue medications:Ozempic 2 mg QW Discussed general issues about diabetes pathophysiology and management. Education: Reviewed 'ABCs' of diabetes management (respective goals in parentheses):  A1C (<7), blood pressure (<130/80), and cholesterol (LDL <70) Dietary recommendations Encouraged aerobic exercise.  Discussed foot care, check daily Yearly retinal exam- scheduled for May Dental exam every 6 months Monitor blood glucose, discussed goal for patient -     Hemoglobin A1c   Essential hypertension Continue current medications: Losartan 50mg  Monitor blood pressure at home; call if consistently over 130/80 Continue DASH diet.   Reminder to go to the ER if any CP, SOB, nausea, dizziness, severe HA, changes vision/speech, left arm numbness and tingling and jaw pain. -     CBC with Differential/Platelet -     COMPLETE METABOLIC PANEL WITH GFR   Hyperlipidemia, unspecified hyperlipidemia type/Statin myopathy Unable to take statins or Zetia due to myopathys Refer to lipid clinic for evaluation Discussed dietary and exercise modifications Low fat diet -     Lipid panel   Vitamin D deficiency Continue supplementation to maintain goal of 70-100 Taking Vitamin D5,000 IU daily   Diabetic peripheral neuropathy associated with type 2 diabetes mellitus (HCC) Doing well at this time Checks feet daily Gabapentin 300mg   Generalized anxiety disorder Citalopram 40mg  daily Stopped Klonopin and has noticed his personality is much improved  Class 2 severe Obesity with serious comorbidity Discussed dietary and exercise modifications   Primary insomnia Doing well Gabapentin 300mg  nighty  Medication management Continued  Vitamin D  deficiency Continue Vit D supplementation   Pituitary Microadenoma Continue to follow with Dr. Lonzo Stephens  Get visual fields tested at ophthalmologist- has appt schedule for May Saw Dr. Maurice Stephens and is to have another MRI in 6 months  Right knee pain Continue curcumin and exercise If pain persists will refer to orthopedics    Discussed med's effects and SE's. Screening labs and tests as requested with regular follow-up as recommended. Over 40 minutes of face to face interview,  exam, counseling, chart review and critical decision making was performed  Future Appointments  Date Time Provider Department Center  04/17/2023  3:30 PM Glenn Dick, NP [facility] None  06/26/2023 10:00 AM Glenn Dick, NP [facility] None  10/29/2023  9:30 AM Stephens, Glenn Dolores, MD [facility] None    HPI Patient presents for a complete physical.  67 y.o. male following up on HTN, HLD, DMII, GERD, weight and vitamin D defciency.  Pituitary adenoma has spiked to the side and he has seen Dr Glenn Stephens. He advised it had shrunk, the spidering were not concerned.  Will have another MRI in 6 months. Occasional blurred vision and has appointment with eye doctor 11/2021. Continues to have some fuzziness in vision   His blood pressure has not monitored at home, today their BP is    BP Readings from Last 3 Encounters:  12/19/22 128/78  10/22/22 136/84  08/31/22 120/62    BMI is There is no height or weight on file to calculate BMI., he has not been working on diet and exercise. No other exercise than work.  Wt Readings from Last 3 Encounters:  12/19/22 214 lb 6.4 oz (97.3 kg)  10/22/22 210 lb (95.3 kg)  08/31/22 211 lb (95.7 kg)  He does not workout, but stays active,working for [employer], works 3+ days a week, lots of walking, retired Medical illustrator. Avg 5-7 miles a day when works  He denies chest pain, shortness of breath, dizziness.  He is on celexa for anxiety  He is not taking  any medication for cholesterol currently.He previously was on Rosuvastatin but made his knees hurt.  Tried Zetia and it hurt his joints.  His cholesterol is not at goal less than 70. The cholesterol last visit was:   Lab Results  Component Value Date   CHOL 228 (H) 12/19/2022   HDL 35 (L) 12/19/2022   LDLCALC 159 (H) 12/19/2022   TRIG 188 (H) 12/19/2022   CHOLHDL 6.5 (H) 12/19/2022     He has been working on diet and exercise for diabetes  with CKD is on ACE/ARB With hyperlipidemia he was on crestor 5mg  and zetia 10mg  but has stopped both he stopped his synjardy due to severe diarrhea and is only taking Ozempic  he is on bASA Has not been checking blood sugars regularly. Blood sugar last check 108.  Eye Exam: Dr. Emily Stephens 12/08/21 and denies paresthesia of the feet, polydipsia, polyuria and visual disturbances.  Last A1C in the office was:  Lab Results  Component Value Date   HGBA1C 5.5 12/19/2022   Lab Results  Component Value Date   EGFR 80 12/19/2022     Patient is on Vitamin D supplement, not on vitamin D.   Lab Results  Component Value Date   VD25OH 31 05/16/2022     Last PSA was: Lab Results  Component Value Date   PSA 0.15 05/16/2022      Current Medications:  Current Outpatient Medications on File Prior to Visit  Medication Sig Dispense Refill   aspirin 81 MG tablet Take 81 mg by mouth daily.     blood glucose meter kit and supplies Test sugars once dailyDispense based insurance preference. E11.9 1 each 0   citalopram (CELEXA) 40 MG tablet Take 1 tablet (40 mg total) by mouth daily. 90 tablet 2   clomiPHENE (CLOMID) 50 MG tablet Take 1 tablet (50 mg total) by mouth as directed, 4 days a week. 48 tablet 2   Cyanocobalamin (VITAMIN B-12 PO) Take by mouth daily.     Evolocumab (REPATHA) 140 MG/ML SOSY Inject 140 mg into the skin every 14 (fourteen) days. 2.1 mL    fluorouracil (EFUDEX) 5 % cream Apply 1 Application topically 2 (two) times daily to actinic keratoses  on the scalp and neck for 3 weeks. wash off in the am. 40 g 1   gabapentin (NEURONTIN) 300 MG capsule Take 3 capsules (900 mg total) by mouth every night for chronic pain or sleep 270 capsule 1   glucose blood (FREESTYLE LITE) test strip Check  Blood Sugar  2 times daily 200 strip 3   Lancets (FREESTYLE) lancets USE TO TEST BLOOD GLUCOSE ONCE A DAY 100 each 0   losartan (COZAAR) 25 MG tablet Take 1 tablet (25 mg total) by mouth daily. 30 tablet 11   MAGNESIUM PO Take by mouth daily.     meloxicam (MOBIC) 7.5 MG tablet Take 1 tablet (7.5 mg total) by mouth daily as needed (try to limit to 5 days per week) 30 tablet 1   omeprazole (PRILOSEC) 20 MG capsule Take 1 capsule (20 mg total) by mouth daily. 90 capsule 2   Semaglutide, 1 MG/DOSE, (OZEMPIC, 1 MG/DOSE,) 4 MG/3ML SOPN Inject 1 mg into the  skin once a week. 3 mL 3   traZODone (DESYREL) 50 MG tablet Take 0.5-1 tablets (25-50 mg total) by mouth for sleep 90 tablet 2   VITAMIN D, CHOLECALCIFEROL, PO Take by mouth daily.     Zinc 50 MG TABS Take by mouth daily.     Current Facility-Administered Medications on File Prior to Visit  Medication Dose Route Frequency Provider Last Rate Last Admin   Evolocumab SOAJ 140 mg  140 mg Subcutaneous Once Glenn Dick, NP       Health Maintenance:  Immunization History  Administered Date(s) Administered   Influenza-Unspecified 05/07/2013, 04/22/2020, 04/10/2021, 04/18/2022   Pneumococcal Polysaccharide-23 06/24/2013   Td 07/09/2006   Tetanus: 2016 at work Pneumovax: 2014 Prevnar 13: Flu vaccine: at work 2021 Zostavax/Shingrix: Discussed with patient DEXA: Colonoscopy: 05/2018 ERCP 08/2018 EGD: Sleep study 2017 Eye Exam: [facility] 2021 Dentist: Dentures, fit well.  Patient Care Team: Lucky Cowboy, MD as PCP - General (Internal Medicine) Jeani Hawking, MD as Consulting Physician (Gastroenterology)  Dr. Eden Emms 2007 normal stress test Dr. Hart [place] Dr. Elnoria Howard Dr. Ophelia Charter  Dr. Fonnie Jarvis  Medical History:  Past Medical History:  Diagnosis Date   Acute calculous cholecystitis s/p lap cholecystectopmy 05/23/2018 05/22/2018   Cancer (HCC)    skin cancer - basal cell   Diabetes mellitus without complication (HCC)    Full dentures    GERD (gastroesophageal reflux disease)    Heart murmur    no problems per pt   Hernia, abdominal    Hyperlipidemia    Hypertension    OSA (obstructive sleep apnea) 08/03/2013   pt does not use CPAP   Pneumonia    as a Stephens child only   Superficial thrombophlebitis of left upper extremity 06/18/2018   Unspecified vitamin D deficiency    Wears glasses    Allergies Allergies  Allergen Reactions   Caduet [Amlodipine-Atorvastatin] Hives and Itching   Statins     BLURRED VISION    SURGICAL HISTORY He  has a past surgical history that includes orthopedic surgeries; Laser ablation; Vasectomy; Metatarsal osteotomy with bunionectomy; Cholecystectomy (N/A, 05/23/2018); Multiple tooth extractions; Upper esophageal endoscopic ultrasound (eus) (N/A, 07/23/2018); biopsy (07/23/2018); Esophagogastroduodenoscopy (egd) with propofol (N/A, 07/23/2018); Endoscopic retrograde cholangiopancreatography (ercp) with propofol (N/A, 08/13/2018); biopsy (08/13/2018); sphincterotomy (08/13/2018); and removal of stones (08/13/2018). FAMILY HISTORY His family history includes Cancer in his cousin and paternal uncle; Diabetes in his sister; Heart disease in his father; Hyperlipidemia in his father; Hypertension in his brother; Liver cancer in his cousin; Melanoma (age of onset: 82) in his mother; Multiple endocrine neoplasia in his maternal grandfather, mother, and sister; Skin cancer in his sister and sister; Stomach cancer in his maternal grandfather. SOCIAL HISTORY He  reports that he has quit smoking. He has quit using smokeless tobacco.  His smokeless tobacco use included chew. He reports that he does not drink alcohol and does not use drugs.  Review of Systems:   Review of Systems  Constitutional: Negative.  Negative for chills and fever.  HENT:  Positive for tinnitus. Negative for congestion, ear discharge, ear pain, hearing loss, nosebleeds, sinus pain and sore throat.   Eyes:  Negative for blurred vision and double vision.  Respiratory: Negative.  Negative for cough, hemoptysis, sputum production, shortness of breath, wheezing and stridor.   Cardiovascular:  Negative for chest pain, palpitations, orthopnea, claudication, leg swelling and PND.  Gastrointestinal:  Positive for constipation (uses metamucil). Negative for abdominal pain, diarrhea, heartburn, nausea and vomiting.  Genitourinary:  Negative for dysuria, frequency and urgency.       Gets up once at night to urinate  Musculoskeletal:  Positive for back pain and joint pain (knees). Negative for falls, myalgias and neck pain.  Skin: Negative.  Negative for rash.  Neurological: Negative.  Negative for dizziness, tingling, tremors, weakness and headaches.  Endo/Heme/Allergies:  Does not bruise/bleed easily.  Psychiatric/Behavioral:  Negative for depression, hallucinations, memory loss, substance abuse and suicidal ideas. The patient is not nervous/anxious and does not have insomnia.     Physical Exam: Estimated body mass index is 29.9 kg/m as calculated from the following:   Height as of 12/19/22: 5\' 11"  (1.803 m).   Weight as of 12/19/22: 214 lb 6.4 oz (97.3 kg). There were no vitals taken for this visit. General Appearance: Well nourished, in no apparent distress.  Eyes: PERRLA, EOMs, conjunctiva no swelling or erythema, normal fundi and vessels.  Sinuses: No Frontal/maxillary tenderness  ENT/Mouth: Ext aud canals clear, normal light reflex with TMs without erythema, bulging. Good dentition. No erythema, swelling, or exudate on post pharynx. Tonsils not swollen or erythematous. Hearing normal.  Neck: Supple, thyroid normal. No bruits  Respiratory: Respiratory effort normal, BS equal  bilaterally without rales, rhonchi, wheezing or stridor.  Cardio: RRR without murmurs, rubs or gallops. Brisk peripheral pulses without edema.  Chest: symmetric, with normal excursions and percussion.  Abdomen: Soft, nontender, no guarding, rebound, hernias, masses, or organomegaly.  Lymphatics: Non tender without lymphadenopathy.  Musculoskeletal: Full ROM all peripheral extremities,5/5 strength. Antalgic gait, Crepitus of both knees on exam worse on right. Bilateral varicose veins lower legs    Skin: Warm, dry without rashes, lesions, ecchymosis. Neuro: Cranial nerves intact, reflexes equal bilaterally. Normal muscle tone, no cerebellar symptoms. Sensation decreased bilateral feet to ankle. Psych: Awake and oriented X 3, normal affect, Insight and Judgment appropriate.       Glenn Stephens E  1:17 PM [facility]

## 2023-04-17 ENCOUNTER — Ambulatory Visit (INDEPENDENT_AMBULATORY_CARE_PROVIDER_SITE_OTHER): Payer: Commercial Managed Care - PPO | Admitting: Nurse Practitioner

## 2023-04-17 ENCOUNTER — Other Ambulatory Visit: Payer: Self-pay

## 2023-04-17 ENCOUNTER — Other Ambulatory Visit (HOSPITAL_COMMUNITY): Payer: Self-pay

## 2023-04-17 ENCOUNTER — Encounter: Payer: Self-pay | Admitting: Nurse Practitioner

## 2023-04-17 VITALS — BP 114/62 | HR 82 | Temp 97.9°F | Ht 71.0 in | Wt 221.0 lb

## 2023-04-17 DIAGNOSIS — F5101 Primary insomnia: Secondary | ICD-10-CM

## 2023-04-17 DIAGNOSIS — E1169 Type 2 diabetes mellitus with other specified complication: Secondary | ICD-10-CM

## 2023-04-17 DIAGNOSIS — F411 Generalized anxiety disorder: Secondary | ICD-10-CM | POA: Diagnosis not present

## 2023-04-17 DIAGNOSIS — G720 Drug-induced myopathy: Secondary | ICD-10-CM

## 2023-04-17 DIAGNOSIS — Z79899 Other long term (current) drug therapy: Secondary | ICD-10-CM | POA: Diagnosis not present

## 2023-04-17 DIAGNOSIS — E1142 Type 2 diabetes mellitus with diabetic polyneuropathy: Secondary | ICD-10-CM

## 2023-04-17 DIAGNOSIS — E1122 Type 2 diabetes mellitus with diabetic chronic kidney disease: Secondary | ICD-10-CM | POA: Diagnosis not present

## 2023-04-17 DIAGNOSIS — D352 Benign neoplasm of pituitary gland: Secondary | ICD-10-CM

## 2023-04-17 DIAGNOSIS — N182 Chronic kidney disease, stage 2 (mild): Secondary | ICD-10-CM

## 2023-04-17 DIAGNOSIS — E559 Vitamin D deficiency, unspecified: Secondary | ICD-10-CM | POA: Diagnosis not present

## 2023-04-17 DIAGNOSIS — I10 Essential (primary) hypertension: Secondary | ICD-10-CM | POA: Diagnosis not present

## 2023-04-17 DIAGNOSIS — H6121 Impacted cerumen, right ear: Secondary | ICD-10-CM | POA: Diagnosis not present

## 2023-04-17 DIAGNOSIS — E785 Hyperlipidemia, unspecified: Secondary | ICD-10-CM

## 2023-04-17 MED ORDER — NEXLETOL 180 MG PO TABS
180.0000 mg | ORAL_TABLET | Freq: Every day | ORAL | 0 refills | Status: DC
Start: 2023-04-17 — End: 2023-04-30
  Filled 2023-04-17 – 2023-04-23 (×2): qty 30, 30d supply, fill #0

## 2023-04-17 NOTE — Patient Instructions (Signed)

## 2023-04-18 ENCOUNTER — Telehealth: Payer: Self-pay

## 2023-04-18 ENCOUNTER — Other Ambulatory Visit: Payer: Self-pay

## 2023-04-18 ENCOUNTER — Other Ambulatory Visit (HOSPITAL_COMMUNITY): Payer: Self-pay

## 2023-04-18 ENCOUNTER — Other Ambulatory Visit: Payer: Self-pay | Admitting: Nurse Practitioner

## 2023-04-18 DIAGNOSIS — I10 Essential (primary) hypertension: Secondary | ICD-10-CM

## 2023-04-18 LAB — COMPLETE METABOLIC PANEL WITH GFR
AG Ratio: 1.8 (calc) (ref 1.0–2.5)
ALT: 17 U/L (ref 9–46)
AST: 19 U/L (ref 10–35)
Albumin: 4.4 g/dL (ref 3.6–5.1)
Alkaline phosphatase (APISO): 53 U/L (ref 35–144)
BUN/Creatinine Ratio: 14 (calc) (ref 6–22)
BUN: 20 mg/dL (ref 7–25)
CO2: 29 mmol/L (ref 20–32)
Calcium: 9.6 mg/dL (ref 8.6–10.3)
Chloride: 104 mmol/L (ref 98–110)
Creat: 1.48 mg/dL — ABNORMAL HIGH (ref 0.70–1.35)
Globulin: 2.4 g/dL (ref 1.9–3.7)
Glucose, Bld: 82 mg/dL (ref 65–99)
Potassium: 4.2 mmol/L (ref 3.5–5.3)
Sodium: 140 mmol/L (ref 135–146)
Total Bilirubin: 0.7 mg/dL (ref 0.2–1.2)
Total Protein: 6.8 g/dL (ref 6.1–8.1)
eGFR: 51 mL/min/{1.73_m2} — ABNORMAL LOW (ref >=60)

## 2023-04-18 LAB — CBC WITH DIFFERENTIAL/PLATELET
Absolute Monocytes: 520 {cells}/uL (ref 200–950)
Basophils Absolute: 24 {cells}/uL (ref 0–200)
Basophils Relative: 0.3 %
Eosinophils Absolute: 224 {cells}/uL (ref 15–500)
Eosinophils Relative: 2.8 %
HCT: 37.7 % — ABNORMAL LOW (ref 38.5–50.0)
Hemoglobin: 12.6 g/dL — ABNORMAL LOW (ref 13.2–17.1)
Lymphs Abs: 3008 {cells}/uL (ref 850–3900)
MCH: 28.8 pg (ref 27.0–33.0)
MCHC: 33.4 g/dL (ref 32.0–36.0)
MCV: 86.3 fL (ref 80.0–100.0)
MPV: 10 fL (ref 7.5–12.5)
Monocytes Relative: 6.5 %
Neutro Abs: 4224 {cells}/uL (ref 1500–7800)
Neutrophils Relative %: 52.8 %
Platelets: 239 10*3/uL (ref 140–400)
RBC: 4.37 10*6/uL (ref 4.20–5.80)
RDW: 12.5 % (ref 11.0–15.0)
Total Lymphocyte: 37.6 %
WBC: 8 10*3/uL (ref 3.8–10.8)

## 2023-04-18 LAB — LIPID PANEL
Cholesterol: 247 mg/dL — ABNORMAL HIGH (ref <200)
HDL: 36 mg/dL — ABNORMAL LOW (ref >=40)
LDL Cholesterol (Calc): 166 mg/dL — ABNORMAL HIGH
Non-HDL Cholesterol (Calc): 211 mg/dL — ABNORMAL HIGH (ref <130)
Total CHOL/HDL Ratio: 6.9 (calc) — ABNORMAL HIGH (ref <5.0)
Triglycerides: 294 mg/dL — ABNORMAL HIGH (ref <150)

## 2023-04-18 LAB — HEMOGLOBIN A1C W/OUT EAG: Hgb A1c MFr Bld: 5.8 %{Hb} — ABNORMAL HIGH (ref <5.7)

## 2023-04-18 MED ORDER — LOSARTAN POTASSIUM 25 MG PO TABS
25.0000 mg | ORAL_TABLET | Freq: Every day | ORAL | 11 refills | Status: DC
Start: 2023-04-18 — End: 2024-04-17
  Filled 2023-04-18 – 2023-04-19 (×2): qty 30, 30d supply, fill #0
  Filled 2023-05-20: qty 30, 30d supply, fill #1
  Filled 2023-06-19: qty 90, 90d supply, fill #2
  Filled 2023-07-02: qty 30, 30d supply, fill #2
  Filled 2023-07-30: qty 30, 30d supply, fill #3
  Filled 2023-09-12: qty 30, 30d supply, fill #4
  Filled 2023-10-11: qty 30, 30d supply, fill #5
  Filled 2023-11-11: qty 30, 30d supply, fill #6

## 2023-04-18 NOTE — Telephone Encounter (Signed)
Prior auth completed and submitted. 

## 2023-04-19 ENCOUNTER — Other Ambulatory Visit (HOSPITAL_COMMUNITY): Payer: Self-pay

## 2023-04-19 ENCOUNTER — Other Ambulatory Visit: Payer: Self-pay

## 2023-04-23 ENCOUNTER — Other Ambulatory Visit (HOSPITAL_COMMUNITY): Payer: Self-pay

## 2023-04-23 ENCOUNTER — Other Ambulatory Visit: Payer: Self-pay

## 2023-04-23 NOTE — Telephone Encounter (Signed)
Prior auth approved through 04/20/24

## 2023-04-24 ENCOUNTER — Other Ambulatory Visit (HOSPITAL_COMMUNITY): Payer: Self-pay

## 2023-04-24 DIAGNOSIS — D352 Benign neoplasm of pituitary gland: Secondary | ICD-10-CM | POA: Diagnosis not present

## 2023-04-30 ENCOUNTER — Other Ambulatory Visit: Payer: Self-pay

## 2023-04-30 ENCOUNTER — Encounter: Payer: Self-pay | Admitting: Nurse Practitioner

## 2023-04-30 DIAGNOSIS — D045 Carcinoma in situ of skin of trunk: Secondary | ICD-10-CM | POA: Diagnosis not present

## 2023-04-30 NOTE — Telephone Encounter (Signed)
Please d/c on his med list and reason as cost

## 2023-05-06 ENCOUNTER — Other Ambulatory Visit (HOSPITAL_BASED_OUTPATIENT_CLINIC_OR_DEPARTMENT_OTHER): Payer: Self-pay

## 2023-05-08 DIAGNOSIS — D0461 Carcinoma in situ of skin of right upper limb, including shoulder: Secondary | ICD-10-CM | POA: Diagnosis not present

## 2023-05-08 DIAGNOSIS — D485 Neoplasm of uncertain behavior of skin: Secondary | ICD-10-CM | POA: Diagnosis not present

## 2023-05-16 DIAGNOSIS — E236 Other disorders of pituitary gland: Secondary | ICD-10-CM | POA: Diagnosis not present

## 2023-05-16 DIAGNOSIS — Z6829 Body mass index (BMI) 29.0-29.9, adult: Secondary | ICD-10-CM | POA: Diagnosis not present

## 2023-05-20 ENCOUNTER — Encounter: Payer: Commercial Managed Care - PPO | Admitting: Nurse Practitioner

## 2023-05-24 DIAGNOSIS — D045 Carcinoma in situ of skin of trunk: Secondary | ICD-10-CM | POA: Diagnosis not present

## 2023-05-27 ENCOUNTER — Encounter: Payer: Self-pay | Admitting: Gastroenterology

## 2023-05-31 ENCOUNTER — Encounter: Payer: Self-pay | Admitting: Gastroenterology

## 2023-06-06 ENCOUNTER — Other Ambulatory Visit: Payer: Self-pay | Admitting: Nurse Practitioner

## 2023-06-06 DIAGNOSIS — M1990 Unspecified osteoarthritis, unspecified site: Secondary | ICD-10-CM

## 2023-06-06 MED ORDER — MELOXICAM 7.5 MG PO TABS
7.5000 mg | ORAL_TABLET | Freq: Every day | ORAL | 1 refills | Status: DC | PRN
  Filled 2023-06-06: qty 30, 30d supply, fill #0
  Filled 2023-07-16: qty 30, 30d supply, fill #1

## 2023-06-07 ENCOUNTER — Other Ambulatory Visit (HOSPITAL_COMMUNITY): Payer: Self-pay

## 2023-06-11 ENCOUNTER — Ambulatory Visit (AMBULATORY_SURGERY_CENTER): Payer: Commercial Managed Care - PPO

## 2023-06-11 ENCOUNTER — Other Ambulatory Visit: Payer: Self-pay

## 2023-06-11 ENCOUNTER — Other Ambulatory Visit (HOSPITAL_COMMUNITY): Payer: Self-pay

## 2023-06-11 VITALS — Ht 71.0 in | Wt 221.0 lb

## 2023-06-11 DIAGNOSIS — Z860100 Personal history of colon polyps, unspecified: Secondary | ICD-10-CM

## 2023-06-11 MED ORDER — NA SULFATE-K SULFATE-MG SULF 17.5-3.13-1.6 GM/177ML PO SOLN
1.0000 | Freq: Once | ORAL | 0 refills | Status: AC
  Filled 2023-06-11: qty 354, 2d supply, fill #0

## 2023-06-11 NOTE — Progress Notes (Signed)

## 2023-06-12 ENCOUNTER — Other Ambulatory Visit (HOSPITAL_COMMUNITY): Payer: Self-pay

## 2023-06-12 ENCOUNTER — Encounter: Payer: Self-pay | Admitting: Gastroenterology

## 2023-06-17 DIAGNOSIS — D0439 Carcinoma in situ of skin of other parts of face: Secondary | ICD-10-CM | POA: Diagnosis not present

## 2023-06-17 DIAGNOSIS — C4441 Basal cell carcinoma of skin of scalp and neck: Secondary | ICD-10-CM | POA: Diagnosis not present

## 2023-06-20 ENCOUNTER — Other Ambulatory Visit: Payer: Self-pay

## 2023-06-25 ENCOUNTER — Encounter: Payer: Self-pay | Admitting: Gastroenterology

## 2023-06-25 ENCOUNTER — Ambulatory Visit: Payer: Commercial Managed Care - PPO | Admitting: Gastroenterology

## 2023-06-25 VITALS — BP 137/72 | HR 57 | Temp 97.9°F | Resp 15 | Ht 69.0 in | Wt 221.0 lb

## 2023-06-25 DIAGNOSIS — G4733 Obstructive sleep apnea (adult) (pediatric): Secondary | ICD-10-CM | POA: Diagnosis not present

## 2023-06-25 DIAGNOSIS — Z1211 Encounter for screening for malignant neoplasm of colon: Secondary | ICD-10-CM | POA: Diagnosis not present

## 2023-06-25 DIAGNOSIS — Z860100 Personal history of colon polyps, unspecified: Secondary | ICD-10-CM

## 2023-06-25 DIAGNOSIS — K641 Second degree hemorrhoids: Secondary | ICD-10-CM

## 2023-06-25 DIAGNOSIS — E119 Type 2 diabetes mellitus without complications: Secondary | ICD-10-CM | POA: Diagnosis not present

## 2023-06-25 DIAGNOSIS — Z860101 Personal history of adenomatous and serrated colon polyps: Secondary | ICD-10-CM | POA: Diagnosis not present

## 2023-06-25 DIAGNOSIS — I10 Essential (primary) hypertension: Secondary | ICD-10-CM | POA: Diagnosis not present

## 2023-06-25 MED ORDER — SODIUM CHLORIDE 0.9 % IV SOLN: 500.0000 mL | Freq: Once | INTRAVENOUS | Status: DC

## 2023-06-25 NOTE — Op Note (Signed)
[facility] Patient Name: Glenn Stephens Procedure Date: 06/25/2023 1:48 PM MRN: [id number] Endoscopist: Corliss Parish , MD, [id number] Age: 67 Referring MD:  Date of Birth: 02-14-1956 Gender: Male Account #: [id number] Procedure:                Colonoscopy Indications:              Surveillance: Personal history of adenomatous                            polyps on last colonoscopy 5 years ago Medicines:                Monitored Anesthesia Care Procedure:                Pre-Anesthesia Assessment:                           - Prior to the procedure, a History and Physical                            was performed, and patient medications and                            allergies were reviewed. The patient's tolerance of                            previous anesthesia was also reviewed. The risks                            and benefits of the procedure and the sedation                            options and risks were discussed with the patient.                            All questions were answered, and informed consent                            was obtained. Prior Anticoagulants: The patient has                            taken no anticoagulant or antiplatelet agents                            except for aspirin and has taken no anticoagulant                            or antiplatelet agents except for NSAID medication.                            ASA Grade Assessment: II - A patient with mild                            systemic disease. After reviewing the risks and  benefits, the patient was deemed in satisfactory                            condition to undergo the procedure.                           After obtaining informed consent, the colonoscope                            was passed under direct vision. Throughout the                            procedure, the patient's blood pressure, pulse, and                            oxygen saturations  were monitored continuously. The                            Olympus CF-HQ190L ([id number]) Colonoscope was                            introduced through the anus and advanced to the 3                            cm into the ileum. The colonoscopy was performed                            without difficulty. The patient tolerated the                            procedure. The quality of the bowel preparation was                            good. The terminal ileum, ileocecal valve,                            appendiceal orifice, and rectum were photographed. Scope In: 2:05:02 PM Scope Out: 2:18:10 PM Scope Withdrawal Time: 0 hours 9 minutes 20 seconds  Total Procedure Duration: 0 hours 13 minutes 8 seconds  Findings:                 The digital rectal exam findings include                            hemorrhoids. Pertinent negatives include no                            palpable rectal lesions.                           The terminal ileum and ileocecal valve appeared                            normal.  Normal mucosa was found in the entire colon.                           Non-bleeding non-thrombosed internal hemorrhoids                            were found during retroflexion, during perianal                            exam and during digital exam. The hemorrhoids were                            Grade II (internal hemorrhoids that prolapse but                            reduce spontaneously). Complications:            No immediate complications. Estimated Blood Loss:     Estimated blood loss: none. Impression:               - Hemorrhoids found on digital rectal exam.                           - The examined portion of the ileum was normal.                           - Normal mucosa in the entire examined colon.                           - Non-bleeding non-thrombosed internal hemorrhoids. Recommendation:           - The patient will be observed post-procedure,                             until all discharge criteria are met.                           - Discharge patient to home.                           - Patient has a contact number available for                            emergencies. The signs and symptoms of potential                            delayed complications were discussed with the                            patient. Return to normal activities tomorrow.                            Written discharge instructions were provided to the                            patient.                           -  High fiber diet.                           - Use FiberCon 1-2 tablets PO daily.                           - Continue present medications.                           - Repeat colonoscopy in 7 years for surveillance                            due to previous history of adenomatous colon polyps.                           - The findings and recommendations were discussed                            with the patient.                           - The findings and recommendations were discussed                            with the patient's family. Corliss Parish, MD 06/25/2023 2:21:23 PM

## 2023-06-25 NOTE — Patient Instructions (Signed)
Resume previous medications. Repeat colonoscopy in 7 years for surveillance. High fiber diet.  Use FiberCon 1-2 tablets daily. Handout on hemorrhoids provided. YOU HAD AN ENDOSCOPIC PROCEDURE TODAY AT THE [facility]:   Refer to the procedure report that was given to you for any specific questions about what was found during the examination.  If the procedure report does not answer your questions, please call your gastroenterologist to clarify.  If you requested that your care partner not be given the details of your procedure findings, then the procedure report has been included in a sealed envelope for you to review at your convenience later.  YOU SHOULD EXPECT: Some feelings of bloating in the abdomen. Passage of more gas than usual.  Walking can help get rid of the air that was put into your GI tract during the procedure and reduce the bloating. If you had a lower endoscopy (such as a colonoscopy or flexible sigmoidoscopy) you may notice spotting of blood in your stool or on the toilet paper. If you underwent a bowel prep for your procedure, you may not have a normal bowel movement for a few days.  Please Note:  You might notice some irritation and congestion in your nose or some drainage.  This is from the oxygen used during your procedure.  There is no need for concern and it should clear up in a day or so.  SYMPTOMS TO REPORT IMMEDIATELY:  Following lower endoscopy (colonoscopy or flexible sigmoidoscopy):  Excessive amounts of blood in the stool  Significant tenderness or worsening of abdominal pains  Swelling of the abdomen that is new, acute  Fever of 100F or higher  For urgent or emergent issues, a gastroenterologist can be reached at any hour by calling [phone]. Do not use MyChart messaging for urgent concerns.    DIET:  We do recommend a small meal at first, but then you may proceed to your regular diet.  Drink plenty of fluids but you should avoid alcoholic  beverages for 24 hours.  ACTIVITY:  You should plan to take it easy for the rest of today and you should NOT DRIVE or use heavy machinery until tomorrow (because of the sedation medicines used during the test).    FOLLOW UP: Our staff will call the number listed on your records the next business day following your procedure.  We will call around 7:15- 8:00 am to check on you and address any questions or concerns that you may have regarding the information given to you following your procedure. If we do not reach you, we will leave a message.     If any biopsies were taken you will be contacted by phone or by letter within the next 1-3 weeks.  Please call us at [phone] if you have not heard about the biopsies in 3 weeks.    SIGNATURES/CONFIDENTIALITY: You and/or your care partner have signed paperwork which will be entered into your electronic medical record.  These signatures attest to the fact that that the information above on your After Visit Summary has been reviewed and is understood.  Full responsibility of the confidentiality of this discharge information lies with you and/or your care-partner.

## 2023-06-25 NOTE — Progress Notes (Signed)
A/O x 3, gd SR's, VSS, report to RN

## 2023-06-25 NOTE — Progress Notes (Signed)
GASTROENTEROLOGY PROCEDURE H&P NOTE   Primary Care Physician: Lucky Cowboy, MD  HPI: Glenn Stephens is a 67 y.o. male who presents for Colonoscopy for surveillance of previous adenomas.  Past Medical History:  Diagnosis Date   Acute calculous cholecystitis s/p lap cholecystectopmy 05/23/2018 05/22/2018   Cancer (HCC)    skin cancer - basal cell   Diabetes mellitus without complication (HCC)    Full dentures    GERD (gastroesophageal reflux disease)    Heart murmur    no problems per pt   Hernia, abdominal    Hyperlipidemia    Hypertension    OSA (obstructive sleep apnea) 08/03/2013   pt does not use CPAP   Pituitary adenoma (HCC) 10/21/2021   Pneumonia    as a small child only   Sleep apnea    Superficial thrombophlebitis of left upper extremity 06/18/2018   Unspecified vitamin D deficiency    Wears glasses    Past Surgical History:  Procedure Laterality Date   BIOPSY  07/23/2018   Procedure: BIOPSY;  Surgeon: Lemar Lofty., MD;  Location: [facility] ENDOSCOPY;  Service: Gastroenterology;;   BIOPSY  08/13/2018   Procedure: BIOPSY;  Surgeon: Lemar Lofty., MD;  Location: [facility] ENDOSCOPY;  Service: Gastroenterology;;   CHOLECYSTECTOMY N/A 05/23/2018   Procedure: LAPAROSCOPIC CHOLECYSTECTOMY;  Surgeon: Berna Bue, MD;  Location: [facility] OR;  Service: General;  Laterality: N/A;   ENDOSCOPIC RETROGRADE CHOLANGIOPANCREATOGRAPHY (ERCP) WITH PROPOFOL N/A 08/13/2018   Procedure: ENDOSCOPIC RETROGRADE CHOLANGIOPANCREATOGRAPHY (ERCP) WITH PROPOFOL;  Surgeon: Lemar Lofty., MD;  Location: [facility] ENDOSCOPY;  Service: Gastroenterology;  Laterality: N/A;   ESOPHAGOGASTRODUODENOSCOPY (EGD) WITH PROPOFOL N/A 07/23/2018   Procedure: ESOPHAGOGASTRODUODENOSCOPY (EGD) WITH PROPOFOL;  Surgeon: Meridee Score Netty Starring., MD;  Location: [facility] ENDOSCOPY;  Service: Gastroenterology;  Laterality: N/A;   LASER ABLATION     veins   METATARSAL OSTEOTOMY WITH BUNIONECTOMY     MULTIPLE  TOOTH EXTRACTIONS     orthopedic surgeries     knee,foot   REMOVAL OF STONES  08/13/2018   Procedure: REMOVAL OF STONES;  Surgeon: Meridee Score Netty Starring., MD;  Location: [facility] ENDOSCOPY;  Service: Gastroenterology;;   Dennison Mascot  08/13/2018   Procedure: Dennison Mascot;  Surgeon: Lemar Lofty., MD;  Location: [facility] ENDOSCOPY;  Service: Gastroenterology;;   UPPER ESOPHAGEAL ENDOSCOPIC ULTRASOUND (EUS) N/A 07/23/2018   Procedure: UPPER ESOPHAGEAL ENDOSCOPIC ULTRASOUND (EUS);  Surgeon: Lemar Lofty., MD;  Location: [facility] ENDOSCOPY;  Service: Gastroenterology;  Laterality: N/A;   VASECTOMY     Current Outpatient Medications  Medication Sig Dispense Refill   aspirin 81 MG tablet Take 81 mg by mouth daily.     citalopram (CELEXA) 40 MG tablet Take 1 tablet (40 mg total) by mouth daily. 90 tablet 2   Cyanocobalamin (VITAMIN B-12 PO) Take by mouth daily.     fluorouracil (EFUDEX) 5 % cream Apply 1 Application topically 2 (two) times daily to actinic keratoses on the scalp and neck for 3 weeks. wash off in the am. 40 g 1   gabapentin (NEURONTIN) 300 MG capsule Take 3 capsules (900 mg total) by mouth every night for chronic pain or sleep 270 capsule 1   losartan (COZAAR) 25 MG tablet Take 1 tablet (25 mg total) by mouth daily. 30 tablet 11   meloxicam (MOBIC) 7.5 MG tablet Take 1 tablet (7.5 mg total) by mouth daily as needed (try to limit to 5 days per week) 30 tablet 1   omeprazole (PRILOSEC) 20 MG capsule Take 1 capsule (20  mg total) by mouth daily. 90 capsule 2   traZODone (DESYREL) 50 MG tablet Take 0.5-1 tablets (25-50 mg total) by mouth for sleep 90 tablet 2   VITAMIN D, CHOLECALCIFEROL, PO Take by mouth daily.     Zinc 50 MG TABS Take by mouth daily.     blood glucose meter kit and supplies Test sugars once dailyDispense based insurance preference. E11.9 (Patient not taking: Reported on 06/11/2023) 1 each 0   glucose blood (FREESTYLE LITE) test strip Check  Blood Sugar  2 times daily  (Patient not taking: Reported on 06/11/2023) 200 strip 3   Lancets (FREESTYLE) lancets USE TO TEST BLOOD GLUCOSE ONCE A DAY (Patient not taking: Reported on 06/11/2023) 100 each 0   MAGNESIUM PO Take by mouth daily.     Semaglutide, 1 MG/DOSE, (OZEMPIC, 1 MG/DOSE,) 4 MG/3ML SOPN Inject 1 mg into the skin once a week. 3 mL 3   Current Facility-Administered Medications  Medication Dose Route Frequency Provider Last Rate Last Admin   0.9 %  sodium chloride infusion  500 mL Intravenous Once Mansouraty, Netty Starring., MD        Current Outpatient Medications:    aspirin 81 MG tablet, Take 81 mg by mouth daily., Disp: , Rfl:    citalopram (CELEXA) 40 MG tablet, Take 1 tablet (40 mg total) by mouth daily., Disp: 90 tablet, Rfl: 2   Cyanocobalamin (VITAMIN B-12 PO), Take by mouth daily., Disp: , Rfl:    fluorouracil (EFUDEX) 5 % cream, Apply 1 Application topically 2 (two) times daily to actinic keratoses on the scalp and neck for 3 weeks. wash off in the am., Disp: 40 g, Rfl: 1   gabapentin (NEURONTIN) 300 MG capsule, Take 3 capsules (900 mg total) by mouth every night for chronic pain or sleep, Disp: 270 capsule, Rfl: 1   losartan (COZAAR) 25 MG tablet, Take 1 tablet (25 mg total) by mouth daily., Disp: 30 tablet, Rfl: 11   meloxicam (MOBIC) 7.5 MG tablet, Take 1 tablet (7.5 mg total) by mouth daily as needed (try to limit to 5 days per week), Disp: 30 tablet, Rfl: 1   omeprazole (PRILOSEC) 20 MG capsule, Take 1 capsule (20 mg total) by mouth daily., Disp: 90 capsule, Rfl: 2   traZODone (DESYREL) 50 MG tablet, Take 0.5-1 tablets (25-50 mg total) by mouth for sleep, Disp: 90 tablet, Rfl: 2   VITAMIN D, CHOLECALCIFEROL, PO, Take by mouth daily., Disp: , Rfl:    Zinc 50 MG TABS, Take by mouth daily., Disp: , Rfl:    blood glucose meter kit and supplies, Test sugars once dailyDispense based insurance preference. E11.9 (Patient not taking: Reported on 06/11/2023), Disp: 1 each, Rfl: 0   glucose blood  (FREESTYLE LITE) test strip, Check  Blood Sugar  2 times daily (Patient not taking: Reported on 06/11/2023), Disp: 200 strip, Rfl: 3   Lancets (FREESTYLE) lancets, USE TO TEST BLOOD GLUCOSE ONCE A DAY (Patient not taking: Reported on 06/11/2023), Disp: 100 each, Rfl: 0   MAGNESIUM PO, Take by mouth daily., Disp: , Rfl:    Semaglutide, 1 MG/DOSE, (OZEMPIC, 1 MG/DOSE,) 4 MG/3ML SOPN, Inject 1 mg into the skin once a week., Disp: 3 mL, Rfl: 3  Current Facility-Administered Medications:    0.9 %  sodium chloride infusion, 500 mL, Intravenous, Once, Mansouraty, Netty Starring., MD Allergies  Allergen Reactions   Caduet [Amlodipine-Atorvastatin] Hives and Itching   Statins     BLURRED VISION   Family History  Problem Relation Age of Onset   Melanoma Mother 93   Multiple endocrine neoplasia Mother        MEN1   Heart disease Father    Hyperlipidemia Father    Diabetes Sister        prothrombin gene mutations, hypercoagulable disorder in sister and her kids.     Multiple endocrine neoplasia Sister        MEN1   Skin cancer Sister    Skin cancer Sister    Hypertension Brother    Cancer Paternal Uncle        unknown type   Stomach cancer Maternal Grandfather    Multiple endocrine neoplasia Maternal Grandfather        MEN1   Liver cancer Cousin        paternal male cousin; d. 74s   Cancer Cousin        paternal male and male cousin; unknown type   Colon cancer Neg Hx    Colon polyps Neg Hx    Esophageal cancer Neg Hx    Rectal cancer Neg Hx    Inflammatory bowel disease Neg Hx    Liver disease Neg Hx    Pancreatic cancer Neg Hx    Social History   Socioeconomic History   Marital status: Married    Spouse name: Not on file   Number of children: Not on file   Years of education: Not on file   Highest education level: Not on file  Occupational History   Not on file  Tobacco Use   Smoking status: Former   Smokeless tobacco: Former    Types: Engineer, drilling   Vaping status:  Never Used  Substance and Sexual Activity   Alcohol use: No   Drug use: No   Sexual activity: Not on file  Other Topics Concern   Not on file  Social History Narrative   Not on file   Social Drivers of Health   Financial Resource Strain: Not on file  Food Insecurity: Not on file  Transportation Needs: Not on file  Physical Activity: Not on file  Stress: Not on file  Social Connections: Not on file  Intimate Partner Violence: Not on file    Physical Exam: Today's Vitals   06/25/23 1305 06/25/23 1309  BP: 136/75   Pulse: (!) 59   Temp: 97.9 F (36.6 C) 97.9 F (36.6 C)  SpO2: 98%   Weight: 221 lb (100.2 kg)   Height: 5\' 9"  (1.753 m)    Body mass index is 32.64 kg/m. GEN: NAD EYE: Sclerae anicteric ENT: MMM CV: Non-tachycardic GI: Soft, NT/ND NEURO:  Alert & Oriented x 3  Lab Results: No results for input(s): "WBC", "HGB", "HCT", "PLT" in the last 72 hours. BMET No results for input(s): "NA", "K", "CL", "CO2", "GLUCOSE", "BUN", "CREATININE", "CALCIUM" in the last 72 hours. LFT No results for input(s): "PROT", "ALBUMIN", "AST", "ALT", "ALKPHOS", "BILITOT", "BILIDIR", "IBILI" in the last 72 hours. PT/INR No results for input(s): "LABPROT", "INR" in the last 72 hours.   Impression / Plan: This is a 67 y.o.male who presents for Colonoscopy for surveillance of previous adenomas.  The risks and benefits of endoscopic evaluation/treatment were discussed with the patient and/or family; these include but are not limited to the risk of perforation, infection, bleeding, missed lesions, lack of diagnosis, severe illness requiring hospitalization, as well as anesthesia and sedation related illnesses.  The patient's history has been reviewed, patient examined, no change in status, and  deemed stable for procedure.  The patient and/or family is agreeable to proceed.    Corliss Parish, MD [facility] Advanced [facility]oscopy Office # [phone]

## 2023-06-25 NOTE — Progress Notes (Signed)
Pt's states no medical or surgical changes since previsit or office visit. 

## 2023-06-25 NOTE — Progress Notes (Unsigned)
 COMPLETE PHYSICAL     Assessment and Plan:  Krieg was seen today for annual exam.  Diagnoses and all orders for this visit:  Encounter for general adult medical examination with abnormal findings Yearly  Type 2 diabetes mellitus with stage 3 chronic kidney disease, without long-term current use of insulin (HCC) Continue medications:Ozempic but will decrease to 1 mg dose as his appetite is very decreased on 2 mg dosage Discussed general issues about diabetes pathophysiology and management. Education: Reviewed 'ABCs' of diabetes management (respective goals in parentheses):  A1C (<7), blood pressure (<130/80), and cholesterol (LDL <70) Dietary recommendations Encouraged aerobic exercise.  Discussed foot care, check daily Yearly retinal exam Dental exam every 6 months Monitor blood glucose, discussed goal for patient - Routine urine with reflex microscopic - Microalbumin/creatinine urine ratio  CKD stage 3 related to Type 2 DM(HCC) Increase fluids, avoid NSAIDS, monitor sugars, will monitor   Essential hypertension Continue current medications: Losartan 25 mg QD Monitor blood pressure at home; call if consistently over 130/80 Continue DASH diet.   Reminder to go to the ER if any CP, SOB, nausea, dizziness, severe HA, changes vision/speech, left arm numbness and tingling and jaw pain. -     CBC with Differential/Platelet -     COMPLETE METABOLIC PANEL WITH GFR -     TSH -     Magnesium  EKG 12 lead  Hyperlipidemia, unspecified hyperlipidemia type/Statin myopathy Unable to take statins or Zetia due to myopathys Discussed coronary calcium score, pt considering Discussed dietary and exercise modifications Low fat diet -     Lipid panel   Vitamin D deficiency Continue supplementation to maintain goal of 70-100 Taking Vitamin D5,000 IU daily -     VITAMIN D 25 Hydroxy (Vit-D Deficiency, Fractures)  OSA on CPAP Continue CPAP/BiPAP, using nightly for at least 8 hours   Helping with daytime fatigue Weight loss still advised Discussed mask & tubing hygeine  Diabetic peripheral neuropathy associated with type 2 diabetes mellitus (HCC) Doing well at this time Checks feet daily Gabapentin 300mg   Generalized anxiety disorder Citalopram 40mg , and clonazepam 1mg  helping with RLS as well  Class 2 Obesity with serious comorbidity Discussed dietary and exercise modifications  Asymptomatic varicose veins of both lower extremities Discussed compression stockings  Primary insomnia Doing well Gabapentin 300mg  nighty  Medication management Magnesium  Vitamin D deficiency Continue Vit D supplementation Check Vit D  Pituitary Microadenoma Continue to follow with [name]  Get visual fields tested at ophthalmologist- will schedule appointment  BPH with lower urinary tract symptoms PSA Only getting up once a night, will continue to monitor  Restless leg syndrome No longer taking requip, has no trouble falling asleep and is sleeping in separate bedroom from wife  Essential tremor Discussed use of beta blocker but declines at this time Continue to monitor  Discussed med's effects and SE's. Screening labs and tests as requested with regular follow-up as recommended. Over 40 minutes of face to face interview,  exam, counseling, chart review and critical decision making was performed  Future Appointments  Date Time Provider Department Center  06/25/2023  2:00 PM Mansouraty, Netty Starring., MD [facility]  06/26/2023 10:00 AM [name], NP [facility] None  10/29/2023  9:30 AM [name], [name], MD [facility] None  06/26/2024 10:00 AM [name], NP [facility] None    HPI Patient presents for a complete physical.  67 y.o. male following up on HTN, HLD, DMII, GERD, weight and vitamin D defciency.  Losartan was decreased to 25 mg at last visit and BP is running in normal range. Today their BP is   Denies headaches,  chest pain, shortness of breath and dizziness.  BP Readings from Last 3 Encounters:  04/17/23 114/62  12/19/22 128/78  10/22/22 136/84    BMI is There is no height or weight on file to calculate BMI., he has not been working on diet and exercise.He has been drinking regular sodas, encouraged to drink diet.  Wt Readings from Last 3 Encounters:  06/11/23 221 lb (100.2 kg)  04/17/23 221 lb (100.2 kg)  12/19/22 214 lb 6.4 oz (97.3 kg)  He does not workout, but stays active,working for [employer], works 3+ days a week, lots of walking, retired Medical illustrator. Avg 5-7 miles a day when works  He denies chest pain, shortness of breath, dizziness.   He is on celexa for anxiety, only on 1/2 a pill.   He is on cholesterol medication, he is on zetia and crestor, and endorses myalgias.  He stopped rosuvastatin once before but cholesterol increased so he restarted this.  His cholesterol is not at goal less than 70. The cholesterol last visit was:   Lab Results  Component Value Date   CHOL 247 (H) 04/17/2023   HDL 36 (L) 04/17/2023   LDLCALC 166 (H) 04/17/2023   TRIG 294 (H) 04/17/2023   CHOLHDL 6.9 (H) 04/17/2023    He has been working on diet and exercise for diabetes  with CKD is on ACE/ARB With hyperlipidemia he was on crestor 5mg  and zetia 10mg  but had side effects- currently on no cholesterol medication he is on Ozempic 2 mg SQ QW, will decrease to 1 mg since his appetite is very decreased and eating very little  he is on bASA Checking sugars 130 Eye Exam: needs to schedule eye exam and denies paresthesia of the feet, polydipsia, polyuria and visual disturbances.  Last A1C in the office was:  Lab Results  Component Value Date   HGBA1C 5.8 (H) 04/17/2023    He needs to push more fluids Lab Results  Component Value Date   EGFR 51 (L) 04/17/2023    Lab Results  Component Value Date   CHOL 247 (H) 04/17/2023   HDL 36 (L) 04/17/2023   LDLCALC 166 (H) 04/17/2023   TRIG 294 (H)  04/17/2023   CHOLHDL 6.9 (H) 04/17/2023    Patient is on Vitamin D supplement, not on vitamin D.   Lab Results  Component Value Date   VD25OH 31 05/16/2022     Last PSA was: Lab Results  Component Value Date   PSA 0.15 05/16/2022   His father had tremors , he has been developing more tremors in his hands.   Current Medications:  Current Outpatient Medications on File Prior to Visit  Medication Sig Dispense Refill   aspirin 81 MG tablet Take 81 mg by mouth daily.     blood glucose meter kit and supplies Test sugars once dailyDispense based insurance preference. E11.9 (Patient not taking: Reported on 06/11/2023) 1 each 0   citalopram (CELEXA) 40 MG tablet Take 1 tablet (40 mg total) by mouth daily. 90 tablet 2   Cyanocobalamin (VITAMIN B-12 PO) Take by mouth daily.     fluorouracil (EFUDEX) 5 % cream Apply 1 Application topically 2 (two) times daily to actinic keratoses on the scalp and neck for 3 weeks. wash off in the am. (Patient not taking: Reported on 06/11/2023) 40 g 1  gabapentin (NEURONTIN) 300 MG capsule Take 3 capsules (900 mg total) by mouth every night for chronic pain or sleep 270 capsule 1   glucose blood (FREESTYLE LITE) test strip Check  Blood Sugar  2 times daily (Patient not taking: Reported on 06/11/2023) 200 strip 3   Lancets (FREESTYLE) lancets USE TO TEST BLOOD GLUCOSE ONCE A DAY (Patient not taking: Reported on 06/11/2023) 100 each 0   losartan (COZAAR) 25 MG tablet Take 1 tablet (25 mg total) by mouth daily. 30 tablet 11   MAGNESIUM PO Take by mouth daily.     meloxicam (MOBIC) 7.5 MG tablet Take 1 tablet (7.5 mg total) by mouth daily as needed (try to limit to 5 days per week) 30 tablet 1   omeprazole (PRILOSEC) 20 MG capsule Take 1 capsule (20 mg total) by mouth daily. 90 capsule 2   Semaglutide, 1 MG/DOSE, (OZEMPIC, 1 MG/DOSE,) 4 MG/3ML SOPN Inject 1 mg into the skin once a week. 3 mL 3   traZODone (DESYREL) 50 MG tablet Take 0.5-1 tablets (25-50 mg total) by  mouth for sleep 90 tablet 2   VITAMIN D, CHOLECALCIFEROL, PO Take by mouth daily.     Zinc 50 MG TABS Take by mouth daily.     No current facility-administered medications on file prior to visit.   Health Maintenance:  Immunization History  Administered Date(s) Administered   Influenza, High Dose Seasonal PF 04/06/2023   Influenza-Unspecified 05/07/2013, 04/22/2020, 04/10/2021, 04/18/2022   Pneumococcal Polysaccharide-23 06/24/2013   Td 07/09/2006   Tetanus: 2016 at work Pneumovax: 2014 Prevnar 13: Flu vaccine: at work 2021 Zostavax/Shingrix: Discussed with patient DEXA: Colonoscopy: 05/2018 ERCP 08/2018 EGD: Sleep study 2017 Eye Exam: [facility] 2021 Dentist: Dentures, fit well.  Patient Care Team: Lucky Cowboy, MD as PCP - General (Internal Medicine) Jeani Hawking, MD as Consulting Physician (Gastroenterology)  Dr. Eden Emms 2007 normal stress test Dr. Hart [place] Dr. Elnoria Howard Dr. Ophelia Charter  Dr. Fonnie Jarvis  Medical History:  Past Medical History:  Diagnosis Date   Acute calculous cholecystitis s/p lap cholecystectopmy 05/23/2018 05/22/2018   Cancer (HCC)    skin cancer - basal cell   Diabetes mellitus without complication (HCC)    Full dentures    GERD (gastroesophageal reflux disease)    Heart murmur    no problems per pt   Hernia, abdominal    Hyperlipidemia    Hypertension    OSA (obstructive sleep apnea) 08/03/2013   pt does not use CPAP   Pituitary adenoma (HCC) 10/21/2021   Pneumonia    as a small child only   Sleep apnea    Superficial thrombophlebitis of left upper extremity 06/18/2018   Unspecified vitamin D deficiency    Wears glasses    Allergies Allergies  Allergen Reactions   Caduet [Amlodipine-Atorvastatin] Hives and Itching   Statins     BLURRED VISION    SURGICAL HISTORY He  has a past surgical history that includes orthopedic surgeries; Laser ablation; Vasectomy; Metatarsal osteotomy with bunionectomy; Cholecystectomy (N/A, 05/23/2018);  Multiple tooth extractions; Upper esophageal endoscopic ultrasound (eus) (N/A, 07/23/2018); biopsy (07/23/2018); Esophagogastroduodenoscopy (egd) with propofol (N/A, 07/23/2018); Endoscopic retrograde cholangiopancreatography (ercp) with propofol (N/A, 08/13/2018); biopsy (08/13/2018); sphincterotomy (08/13/2018); and removal of stones (08/13/2018). FAMILY HISTORY His family history includes Cancer in his cousin and paternal uncle; Diabetes in his sister; Heart disease in his father; Hyperlipidemia in his father; Hypertension in his brother; Liver cancer in his cousin; Melanoma (age of onset: 78) in his mother; Multiple endocrine neoplasia in  his maternal grandfather, mother, and sister; Skin cancer in his sister and sister; Stomach cancer in his maternal grandfather. SOCIAL HISTORY He  reports that he has quit smoking. He has quit using smokeless tobacco.  His smokeless tobacco use included chew. He reports that he does not drink alcohol and does not use drugs.  Review of Systems:  Review of Systems  Constitutional: Negative.  Negative for chills and fever.  HENT:  Positive for tinnitus. Negative for congestion, ear discharge, ear pain, hearing loss, nosebleeds, sinus pain and sore throat.   Eyes:  Positive for blurred vision (in morning but clears during the day). Negative for double vision.  Respiratory: Negative.  Negative for cough, hemoptysis, sputum production, shortness of breath, wheezing and stridor.   Cardiovascular:  Negative for chest pain, palpitations, orthopnea, claudication, leg swelling and PND.  Gastrointestinal:  Positive for constipation (uses metamucil). Negative for abdominal pain, diarrhea, heartburn, nausea and vomiting.  Genitourinary:  Negative for dysuria, frequency and urgency.       Gets up once at night to urinate  Musculoskeletal:  Positive for back pain and joint pain (knees). Negative for falls, myalgias and neck pain.  Skin: Negative.  Negative for rash.  Neurological:   Positive for tremors (hands). Negative for dizziness, tingling, weakness and headaches.  Endo/Heme/Allergies:  Does not bruise/bleed easily.  Psychiatric/Behavioral:  Negative for depression, hallucinations, memory loss, substance abuse and suicidal ideas. The patient has insomnia. The patient is not nervous/anxious.     Physical Exam: Estimated body mass index is 30.82 kg/m as calculated from the following:   Height as of 06/11/23: 5\' 11"  (1.803 m).   Weight as of 06/11/23: 221 lb (100.2 kg). There were no vitals taken for this visit. General Appearance: Well nourished, in no apparent distress.  Eyes: PERRLA, EOMs, conjunctiva no swelling or erythema, normal fundi and vessels.  Sinuses: No Frontal/maxillary tenderness  ENT/Mouth: Ext aud canals clear, normal light reflex with TMs without erythema, bulging. Good dentition. No erythema, swelling, or exudate on post pharynx. Tonsils not swollen or erythematous. Hearing normal.  Neck: Supple, thyroid normal. No bruits  Respiratory: Respiratory effort normal, BS equal bilaterally without rales, rhonchi, wheezing or stridor.  Cardio: RRR without murmurs, rubs or gallops. Brisk peripheral pulses without edema.  Chest: symmetric, with normal excursions and percussion.  Abdomen: Soft, nontender, no guarding, rebound, hernias, masses, or organomegaly.  Lymphatics: Non tender without lymphadenopathy.  Genitourinary: defer Musculoskeletal: Full ROM all peripheral extremities,5/5 strength, and normal gait. Bilateral varicose veins lower legs    Skin: Warm, dry without rashes, lesions, ecchymosis. Neuro: Cranial nerves intact, reflexes equal bilaterally. Normal muscle tone, no cerebellar symptoms. Sensation decreased bilateral feet to ankle. Mild tremor noted of hands, no cogwheel rigidity. Psych: Awake and oriented X 3, normal affect, Insight and Judgment appropriate.     EKG: NSR, AV BLOCK I  AORTA SCAN: < 3 cm  Sallee Hogrefe E  12:18  PM [facility]

## 2023-06-26 ENCOUNTER — Encounter: Payer: Self-pay | Admitting: Nurse Practitioner

## 2023-06-26 ENCOUNTER — Telehealth: Payer: Self-pay | Admitting: *Deleted

## 2023-06-26 ENCOUNTER — Ambulatory Visit (INDEPENDENT_AMBULATORY_CARE_PROVIDER_SITE_OTHER): Payer: Commercial Managed Care - PPO | Admitting: Nurse Practitioner

## 2023-06-26 VITALS — BP 112/68 | HR 79 | Temp 97.5°F | Ht 70.5 in | Wt 216.0 lb

## 2023-06-26 DIAGNOSIS — I10 Essential (primary) hypertension: Secondary | ICD-10-CM

## 2023-06-26 DIAGNOSIS — Z1389 Encounter for screening for other disorder: Secondary | ICD-10-CM

## 2023-06-26 DIAGNOSIS — F411 Generalized anxiety disorder: Secondary | ICD-10-CM

## 2023-06-26 DIAGNOSIS — I700 Atherosclerosis of aorta: Secondary | ICD-10-CM | POA: Diagnosis not present

## 2023-06-26 DIAGNOSIS — Z0001 Encounter for general adult medical examination with abnormal findings: Secondary | ICD-10-CM

## 2023-06-26 DIAGNOSIS — Z1329 Encounter for screening for other suspected endocrine disorder: Secondary | ICD-10-CM

## 2023-06-26 DIAGNOSIS — G250 Essential tremor: Secondary | ICD-10-CM

## 2023-06-26 DIAGNOSIS — N183 Chronic kidney disease, stage 3 unspecified: Secondary | ICD-10-CM | POA: Diagnosis not present

## 2023-06-26 DIAGNOSIS — N1831 Chronic kidney disease, stage 3a: Secondary | ICD-10-CM

## 2023-06-26 DIAGNOSIS — E785 Hyperlipidemia, unspecified: Secondary | ICD-10-CM | POA: Diagnosis not present

## 2023-06-26 DIAGNOSIS — Z79899 Other long term (current) drug therapy: Secondary | ICD-10-CM | POA: Diagnosis not present

## 2023-06-26 DIAGNOSIS — E1122 Type 2 diabetes mellitus with diabetic chronic kidney disease: Secondary | ICD-10-CM | POA: Diagnosis not present

## 2023-06-26 DIAGNOSIS — E1169 Type 2 diabetes mellitus with other specified complication: Secondary | ICD-10-CM

## 2023-06-26 DIAGNOSIS — G720 Drug-induced myopathy: Secondary | ICD-10-CM

## 2023-06-26 DIAGNOSIS — Z136 Encounter for screening for cardiovascular disorders: Secondary | ICD-10-CM

## 2023-06-26 DIAGNOSIS — Z0000 Encounter for general adult medical examination without abnormal findings: Secondary | ICD-10-CM

## 2023-06-26 DIAGNOSIS — I8393 Asymptomatic varicose veins of bilateral lower extremities: Secondary | ICD-10-CM

## 2023-06-26 DIAGNOSIS — E1142 Type 2 diabetes mellitus with diabetic polyneuropathy: Secondary | ICD-10-CM

## 2023-06-26 DIAGNOSIS — E559 Vitamin D deficiency, unspecified: Secondary | ICD-10-CM | POA: Diagnosis not present

## 2023-06-26 DIAGNOSIS — G2581 Restless legs syndrome: Secondary | ICD-10-CM

## 2023-06-26 DIAGNOSIS — F5101 Primary insomnia: Secondary | ICD-10-CM

## 2023-06-26 DIAGNOSIS — G4733 Obstructive sleep apnea (adult) (pediatric): Secondary | ICD-10-CM

## 2023-06-26 DIAGNOSIS — N401 Enlarged prostate with lower urinary tract symptoms: Secondary | ICD-10-CM

## 2023-06-26 DIAGNOSIS — D352 Benign neoplasm of pituitary gland: Secondary | ICD-10-CM

## 2023-06-26 DIAGNOSIS — E66812 Obesity, class 2: Secondary | ICD-10-CM

## 2023-06-26 NOTE — Patient Instructions (Signed)

## 2023-06-26 NOTE — Telephone Encounter (Signed)
  Follow up Call-     06/25/2023    1:09 PM  Call back number  Post procedure Call Back phone  # [phone]-wifes number Cordelia Pen  Permission to leave phone message Yes     Patient questions:   Message left to call us if necessary.

## 2023-06-27 LAB — URINALYSIS, ROUTINE W REFLEX MICROSCOPIC
Bacteria, UA: NONE SEEN /[HPF]
Bilirubin Urine: NEGATIVE
Glucose, UA: NEGATIVE
Hgb urine dipstick: NEGATIVE
Leukocytes,Ua: NEGATIVE
Nitrite: NEGATIVE
RBC / HPF: NONE SEEN /[HPF] (ref 0–2)
Specific Gravity, Urine: 1.031 (ref 1.001–1.035)
Squamous Epithelial / HPF: NONE SEEN /[HPF] (ref <=5)
WBC, UA: NONE SEEN /[HPF] (ref 0–5)
pH: 5.5 (ref 5.0–8.0)

## 2023-06-27 LAB — COMPLETE METABOLIC PANEL WITH GFR
AG Ratio: 1.6 (calc) (ref 1.0–2.5)
ALT: 17 U/L (ref 9–46)
AST: 17 U/L (ref 10–35)
Albumin: 4.2 g/dL (ref 3.6–5.1)
Alkaline phosphatase (APISO): 53 U/L (ref 35–144)
BUN: 19 mg/dL (ref 7–25)
CO2: 28 mmol/L (ref 20–32)
Calcium: 9.5 mg/dL (ref 8.6–10.3)
Chloride: 103 mmol/L (ref 98–110)
Creat: 1.14 mg/dL (ref 0.70–1.35)
Globulin: 2.7 g/dL (ref 1.9–3.7)
Glucose, Bld: 112 mg/dL — ABNORMAL HIGH (ref 65–99)
Potassium: 4.2 mmol/L (ref 3.5–5.3)
Sodium: 139 mmol/L (ref 135–146)
Total Bilirubin: 0.5 mg/dL (ref 0.2–1.2)
Total Protein: 6.9 g/dL (ref 6.1–8.1)
eGFR: 70 mL/min/{1.73_m2} (ref >=60)

## 2023-06-27 LAB — CBC WITH DIFFERENTIAL/PLATELET
Absolute Lymphocytes: 2746 {cells}/uL (ref 850–3900)
Absolute Monocytes: 396 {cells}/uL (ref 200–950)
Basophils Absolute: 40 {cells}/uL (ref 0–200)
Basophils Relative: 0.6 %
Eosinophils Absolute: 271 {cells}/uL (ref 15–500)
Eosinophils Relative: 4.1 %
HCT: 38.1 % — ABNORMAL LOW (ref 38.5–50.0)
Hemoglobin: 13 g/dL — ABNORMAL LOW (ref 13.2–17.1)
MCH: 29.6 pg (ref 27.0–33.0)
MCHC: 34.1 g/dL (ref 32.0–36.0)
MCV: 86.8 fL (ref 80.0–100.0)
MPV: 9.9 fL (ref 7.5–12.5)
Monocytes Relative: 6 %
Neutro Abs: 3148 {cells}/uL (ref 1500–7800)
Neutrophils Relative %: 47.7 %
Platelets: 195 10*3/uL (ref 140–400)
RBC: 4.39 10*6/uL (ref 4.20–5.80)
RDW: 12.6 % (ref 11.0–15.0)
Total Lymphocyte: 41.6 %
WBC: 6.6 10*3/uL (ref 3.8–10.8)

## 2023-06-27 LAB — LIPID PANEL
Cholesterol: 242 mg/dL — ABNORMAL HIGH (ref <200)
HDL: 34 mg/dL — ABNORMAL LOW (ref >=40)
LDL Cholesterol (Calc): 169 mg/dL — ABNORMAL HIGH
Non-HDL Cholesterol (Calc): 208 mg/dL — ABNORMAL HIGH (ref <130)
Total CHOL/HDL Ratio: 7.1 (calc) — ABNORMAL HIGH (ref <5.0)
Triglycerides: 220 mg/dL — ABNORMAL HIGH (ref <150)

## 2023-06-27 LAB — MAGNESIUM: Magnesium: 1.9 mg/dL (ref 1.5–2.5)

## 2023-06-27 LAB — MICROALBUMIN / CREATININE URINE RATIO
Creatinine, Urine: 352 mg/dL — ABNORMAL HIGH (ref 20–320)
Microalb Creat Ratio: 2 mg/g{creat} (ref <30)
Microalb, Ur: 0.7 mg/dL

## 2023-06-27 LAB — VITAMIN D 25 HYDROXY (VIT D DEFICIENCY, FRACTURES): Vit D, 25-Hydroxy: 51 ng/mL (ref 30–100)

## 2023-06-27 LAB — HEMOGLOBIN A1C W/OUT EAG: Hgb A1c MFr Bld: 5.7 %{Hb} — ABNORMAL HIGH (ref <5.7)

## 2023-06-27 LAB — TSH: TSH: 2.55 m[IU]/L (ref 0.40–4.50)

## 2023-06-27 LAB — MICROSCOPIC MESSAGE

## 2023-07-02 ENCOUNTER — Other Ambulatory Visit (HOSPITAL_COMMUNITY): Payer: Self-pay

## 2023-07-09 DIAGNOSIS — D0462 Carcinoma in situ of skin of left upper limb, including shoulder: Secondary | ICD-10-CM | POA: Diagnosis not present

## 2023-07-12 ENCOUNTER — Other Ambulatory Visit (HOSPITAL_COMMUNITY): Payer: Self-pay

## 2023-07-23 DIAGNOSIS — D0462 Carcinoma in situ of skin of left upper limb, including shoulder: Secondary | ICD-10-CM | POA: Diagnosis not present

## 2023-07-23 DIAGNOSIS — D0439 Carcinoma in situ of skin of other parts of face: Secondary | ICD-10-CM | POA: Diagnosis not present

## 2023-08-02 DIAGNOSIS — C44329 Squamous cell carcinoma of skin of other parts of face: Secondary | ICD-10-CM | POA: Diagnosis not present

## 2023-08-02 DIAGNOSIS — C44229 Squamous cell carcinoma of skin of left ear and external auricular canal: Secondary | ICD-10-CM | POA: Diagnosis not present

## 2023-08-16 ENCOUNTER — Other Ambulatory Visit: Payer: Self-pay

## 2023-08-16 DIAGNOSIS — D0462 Carcinoma in situ of skin of left upper limb, including shoulder: Secondary | ICD-10-CM | POA: Diagnosis not present

## 2023-08-22 DIAGNOSIS — D0439 Carcinoma in situ of skin of other parts of face: Secondary | ICD-10-CM | POA: Diagnosis not present

## 2023-08-22 DIAGNOSIS — D485 Neoplasm of uncertain behavior of skin: Secondary | ICD-10-CM | POA: Diagnosis not present

## 2023-08-22 DIAGNOSIS — C44529 Squamous cell carcinoma of skin of other part of trunk: Secondary | ICD-10-CM | POA: Diagnosis not present

## 2023-08-22 DIAGNOSIS — L570 Actinic keratosis: Secondary | ICD-10-CM | POA: Diagnosis not present

## 2023-08-29 DIAGNOSIS — C44229 Squamous cell carcinoma of skin of left ear and external auricular canal: Secondary | ICD-10-CM | POA: Diagnosis not present

## 2023-08-30 ENCOUNTER — Other Ambulatory Visit (HOSPITAL_COMMUNITY): Payer: Self-pay

## 2023-09-10 DIAGNOSIS — L821 Other seborrheic keratosis: Secondary | ICD-10-CM | POA: Diagnosis not present

## 2023-09-12 ENCOUNTER — Other Ambulatory Visit: Payer: Self-pay

## 2023-09-12 ENCOUNTER — Other Ambulatory Visit (HOSPITAL_COMMUNITY): Payer: Self-pay

## 2023-09-12 DIAGNOSIS — M1990 Unspecified osteoarthritis, unspecified site: Secondary | ICD-10-CM

## 2023-09-12 MED ORDER — MELOXICAM 7.5 MG PO TABS
7.5000 mg | ORAL_TABLET | Freq: Every day | ORAL | 1 refills | Status: DC | PRN
  Filled 2023-09-12 – 2024-02-18 (×4): qty 30, 30d supply, fill #0

## 2023-09-13 ENCOUNTER — Other Ambulatory Visit: Payer: Self-pay

## 2023-09-13 ENCOUNTER — Other Ambulatory Visit (HOSPITAL_COMMUNITY): Payer: Self-pay

## 2023-09-13 MED ORDER — OZEMPIC (1 MG/DOSE) 4 MG/3ML ~~LOC~~ SOPN
1.0000 mg | PEN_INJECTOR | SUBCUTANEOUS | 3 refills | Status: DC
  Filled 2023-09-13: qty 3, 28d supply, fill #0
  Filled 2023-10-09: qty 3, 28d supply, fill #1
  Filled 2023-11-08: qty 3, 28d supply, fill #2
  Filled 2023-12-06: qty 3, 28d supply, fill #3

## 2023-09-14 ENCOUNTER — Encounter (HOSPITAL_COMMUNITY): Payer: Self-pay

## 2023-09-16 ENCOUNTER — Other Ambulatory Visit (HOSPITAL_COMMUNITY): Payer: Self-pay

## 2023-09-20 ENCOUNTER — Other Ambulatory Visit (HOSPITAL_COMMUNITY): Payer: Self-pay

## 2023-09-20 DIAGNOSIS — D0462 Carcinoma in situ of skin of left upper limb, including shoulder: Secondary | ICD-10-CM | POA: Diagnosis not present

## 2023-09-30 DIAGNOSIS — C44529 Squamous cell carcinoma of skin of other part of trunk: Secondary | ICD-10-CM | POA: Diagnosis not present

## 2023-10-08 ENCOUNTER — Ambulatory Visit: Payer: Commercial Managed Care - PPO | Admitting: Nurse Practitioner

## 2023-10-09 ENCOUNTER — Other Ambulatory Visit: Payer: Self-pay

## 2023-10-22 ENCOUNTER — Ambulatory Visit: Payer: Commercial Managed Care - PPO | Admitting: Internal Medicine

## 2023-10-23 ENCOUNTER — Other Ambulatory Visit (HOSPITAL_COMMUNITY): Payer: Self-pay

## 2023-10-23 DIAGNOSIS — L570 Actinic keratosis: Secondary | ICD-10-CM | POA: Diagnosis not present

## 2023-10-23 DIAGNOSIS — D0462 Carcinoma in situ of skin of left upper limb, including shoulder: Secondary | ICD-10-CM | POA: Diagnosis not present

## 2023-10-29 ENCOUNTER — Ambulatory Visit: Payer: Commercial Managed Care - PPO | Admitting: Internal Medicine

## 2023-11-11 ENCOUNTER — Other Ambulatory Visit: Payer: Self-pay

## 2023-11-13 ENCOUNTER — Other Ambulatory Visit: Payer: Self-pay

## 2023-11-13 ENCOUNTER — Other Ambulatory Visit (HOSPITAL_COMMUNITY): Payer: Self-pay

## 2023-11-13 ENCOUNTER — Ambulatory Visit: Admitting: Family Medicine

## 2023-11-13 ENCOUNTER — Encounter: Payer: Self-pay | Admitting: Family Medicine

## 2023-11-13 VITALS — BP 136/80 | HR 72 | Ht 70.0 in | Wt 217.2 lb

## 2023-11-13 DIAGNOSIS — N181 Chronic kidney disease, stage 1: Secondary | ICD-10-CM

## 2023-11-13 DIAGNOSIS — Z7985 Long-term (current) use of injectable non-insulin antidiabetic drugs: Secondary | ICD-10-CM

## 2023-11-13 DIAGNOSIS — I10 Essential (primary) hypertension: Secondary | ICD-10-CM | POA: Diagnosis not present

## 2023-11-13 DIAGNOSIS — G4733 Obstructive sleep apnea (adult) (pediatric): Secondary | ICD-10-CM

## 2023-11-13 DIAGNOSIS — K219 Gastro-esophageal reflux disease without esophagitis: Secondary | ICD-10-CM | POA: Diagnosis not present

## 2023-11-13 DIAGNOSIS — F411 Generalized anxiety disorder: Secondary | ICD-10-CM | POA: Diagnosis not present

## 2023-11-13 DIAGNOSIS — Z7689 Persons encountering health services in other specified circumstances: Secondary | ICD-10-CM | POA: Insufficient documentation

## 2023-11-13 DIAGNOSIS — E1122 Type 2 diabetes mellitus with diabetic chronic kidney disease: Secondary | ICD-10-CM

## 2023-11-13 DIAGNOSIS — E559 Vitamin D deficiency, unspecified: Secondary | ICD-10-CM

## 2023-11-13 DIAGNOSIS — D352 Benign neoplasm of pituitary gland: Secondary | ICD-10-CM | POA: Diagnosis not present

## 2023-11-13 DIAGNOSIS — F5101 Primary insomnia: Secondary | ICD-10-CM | POA: Diagnosis not present

## 2023-11-13 DIAGNOSIS — E785 Hyperlipidemia, unspecified: Secondary | ICD-10-CM

## 2023-11-13 MED ORDER — TRAZODONE HCL 50 MG PO TABS
25.0000 mg | ORAL_TABLET | ORAL | 2 refills | Status: DC
  Filled 2023-11-13 – 2024-02-17 (×5): qty 90, 90d supply, fill #0
  Filled 2024-05-07: qty 90, 90d supply, fill #1

## 2023-11-13 MED ORDER — FREESTYLE LIBRE 3 PLUS SENSOR MISC
2 refills | Status: AC
  Filled 2023-11-13: qty 2, 28d supply, fill #0
  Filled 2023-12-02 – 2024-02-18 (×4): qty 2, 30d supply, fill #0

## 2023-11-13 MED ORDER — LOSARTAN POTASSIUM 25 MG PO TABS
25.0000 mg | ORAL_TABLET | Freq: Every day | ORAL | 1 refills | Status: DC
  Filled 2023-11-13 – 2024-03-07 (×5): qty 90, 90d supply, fill #0

## 2023-11-13 MED ORDER — OMEPRAZOLE 20 MG PO CPDR
20.0000 mg | DELAYED_RELEASE_CAPSULE | Freq: Every day | ORAL | 2 refills | Status: DC
  Filled 2023-11-13: qty 90, 90d supply, fill #0
  Filled 2024-02-07: qty 90, 90d supply, fill #1
  Filled 2024-02-10 – 2024-05-04 (×2): qty 90, 90d supply, fill #0

## 2023-11-13 NOTE — Assessment & Plan Note (Signed)
 Well controlled on Omeprazole . Continue anti-reflux measures such as raising the head of the bed, avoiding tight clothing or belts, avoiding eating late at night and not lying down shortly after mealtime and achieving weight loss are discussed. Avoid ASA, NSAID's, caffeine, peppermints, alcohol and tobacco. Alert me if there are persistent symptoms, dysphagia, weight loss or GI bleeding.

## 2023-11-13 NOTE — Assessment & Plan Note (Addendum)
 Last A1c 5.7%. Continue Ozempic  1mg  weekly. Labs today. Would like CGM, order placed. A1c and uACR UTD. Foot exam UTD. Vaccines declined. Retinal eye exam scheduled for July. Recommend heart healthy diet such as Mediterranean diet with whole grains, fruits, vegetable, fish, lean meats, nuts, and olive oil. Limit salt. Encouraged moderate walking, 3-5 times/week for 30-50 minutes each session. Aim for at least 150 minutes.week. Goal should be pace of 3 miles/hours, or walking 1.5 miles in 30 minutes. Seek medical care for urinary frequency, extreme thirst, vision changes, lightheadedness, dizziness.  Follow up in 3 months or sooner if needed

## 2023-11-13 NOTE — Assessment & Plan Note (Signed)
 Recheck labs today, discussed treatment options. Reluctant to try Repatha , considering CAC. Strongly encouraged recommend consuming a heart healthy diet such as Mediterranean diet or DASH diet with whole grains, fruits, vegetable, fish, lean meats, nuts, and olive oil. Limit sweets and processed foods. I also encourage moderate intensity exercise 150 minutes weekly. This is 3-5 times weekly for 30-50 minutes each session. Goal should be pace of 3 miles/hours, or walking 1.5 miles in 30 minutes. The 10-year ASCVD risk score (Arnett DK, et al., 2019) is: 45%

## 2023-11-13 NOTE — Assessment & Plan Note (Signed)
Well controlled on Celexa.

## 2023-11-13 NOTE — Progress Notes (Signed)
 New Patient Office Visit  Subjective    Patient ID: Glenn Stephens, male    DOB: [birth year]  Age: 68 y.o. MRN: [id number]  CC:  Chief Complaint  Patient presents with   Establish Care    Last physical 12/24    HPI Glenn Stephens presents to establish care. Oriented to practice routines and expectations. Has been seeing PCP regularly, last CPE and OV 06/26/2023. Is seeing Ortho for his bilateral knee pain, Neurology for pituitary adenoma, Dermatology for [facility], and Endo. PMH includes OSA not using CPAP, PTSD, DM2, HTN, HLD, GAD, RLS, essential tremor. No new concerns or changes in chronic conditions. Ophthalmology appt in July.    HTN: not monitoring, on Losartan  25mg  daily, denies chest pain, palpitations, recurrent headaches, vision changes, lightheadedness, dizziness, dyspnea on exertion, or swelling of extremities.  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   blood glucose meter kit and supplies Test sugars once dailyDispense based insurance preference. E11.9   citalopram  (CELEXA ) 40 MG tablet Take 1 tablet (40 mg total) by mouth daily.   Continuous Glucose Sensor (FREESTYLE LIBRE 3 PLUS SENSOR) MISC Change sensor every 15 days.   Cyanocobalamin (VITAMIN B-12 PO) Take by mouth daily.   fluorouracil  (EFUDEX ) 5 % cream Apply 1 Application topically 2 (two) times daily to actinic keratoses on the scalp and neck for  3 weeks. wash off in the am.   gabapentin  (NEURONTIN ) 300 MG capsule Take 3 capsules (900 mg total) by mouth every night for chronic pain or sleep   glucose blood (FREESTYLE LITE) test strip Check  Blood Sugar  2 times daily (Patient taking differently: Check  Blood Sugar  2 times daily)   Lancets (FREESTYLE) lancets USE TO TEST BLOOD GLUCOSE ONCE A DAY   MAGNESIUM PO Take by mouth daily.   meloxicam  (MOBIC ) 7.5 MG tablet Take 1 tablet (7.5 mg total) by mouth daily as needed (try to limit to 5 days per week)   Semaglutide , 1 MG/DOSE, (OZEMPIC , 1 MG/DOSE,) 4 MG/3ML SOPN Inject 1 mg into the skin once a week.   VITAMIN D , CHOLECALCIFEROL, PO Take by mouth daily.   Zinc 50 MG TABS Take by mouth daily.   [DISCONTINUED] losartan  (COZAAR ) 25 MG tablet Take 1 tablet (25 mg total) by mouth daily.   [DISCONTINUED] omeprazole  (PRILOSEC) 20 MG capsule Take 1 capsule (20 mg total) by mouth daily.   [DISCONTINUED] traZODone  (DESYREL ) 50 MG tablet Take 0.5-1 tablets (25-50 mg total) by mouth for sleep   losartan  (COZAAR ) 25 MG tablet Take 1 tablet (25 mg total) by mouth daily.   omeprazole  (PRILOSEC) 20 MG capsule Take 1 capsule (20 mg total) by mouth daily.   traZODone  (DESYREL ) 50 MG tablet Take 1/2-1 tablet (25-50 mg total) by mouth for sleep   No facility-administered encounter medications on file as of 11/13/2023.    Past Medical History:  Diagnosis Date   Acute calculous cholecystitis s/p lap cholecystectopmy 05/23/2018 05/22/2018  Cancer (HCC)    skin cancer - basal cell   Diabetes mellitus without complication (HCC)    Full dentures    GERD (gastroesophageal reflux disease)    Heart murmur    no problems per pt   Hernia, abdominal    Hyperlipidemia    Hypertension    OSA (obstructive sleep apnea) 08/03/2013   pt does not use CPAP   Pituitary adenoma (HCC) 10/21/2021   Pneumonia    as a small child only   Sleep apnea    Superficial thrombophlebitis of left upper extremity 06/18/2018    Unspecified vitamin D  deficiency    Wears glasses     Past Surgical History:  Procedure Laterality Date   BIOPSY  07/23/2018   Procedure: BIOPSY;  Surgeon: Normie Becton., MD;  Location: [facility] ENDOSCOPY;  Service: Gastroenterology;;   BIOPSY  08/13/2018   Procedure: BIOPSY;  Surgeon: Normie Becton., MD;  Location: [facility] ENDOSCOPY;  Service: Gastroenterology;;   CHOLECYSTECTOMY N/A 05/23/2018   Procedure: LAPAROSCOPIC CHOLECYSTECTOMY;  Surgeon: Adalberto Acton, MD;  Location: [facility] OR;  Service: General;  Laterality: N/A;   ENDOSCOPIC RETROGRADE CHOLANGIOPANCREATOGRAPHY (ERCP) WITH PROPOFOL  N/A 08/13/2018   Procedure: ENDOSCOPIC RETROGRADE CHOLANGIOPANCREATOGRAPHY (ERCP) WITH PROPOFOL ;  Surgeon: Normie Becton., MD;  Location: [facility] ENDOSCOPY;  Service: Gastroenterology;  Laterality: N/A;   ESOPHAGOGASTRODUODENOSCOPY (EGD) WITH PROPOFOL  N/A 07/23/2018   Procedure: ESOPHAGOGASTRODUODENOSCOPY (EGD) WITH PROPOFOL ;  Surgeon: Brice Campi Albino Alu., MD;  Location: [facility] ENDOSCOPY;  Service: Gastroenterology;  Laterality: N/A;   LASER ABLATION     veins   METATARSAL OSTEOTOMY WITH BUNIONECTOMY     MULTIPLE TOOTH EXTRACTIONS     orthopedic surgeries     knee,foot   REMOVAL OF STONES  08/13/2018   Procedure: REMOVAL OF STONES;  Surgeon: Brice Campi Albino Alu., MD;  Location: [facility] ENDOSCOPY;  Service: Gastroenterology;;   [address]  08/13/2018   Procedure: [address];  Surgeon: Normie Becton., MD;  Location: [facility] ENDOSCOPY;  Service: Gastroenterology;;   UPPER ESOPHAGEAL ENDOSCOPIC ULTRASOUND (EUS) N/A 07/23/2018   Procedure: UPPER ESOPHAGEAL ENDOSCOPIC ULTRASOUND (EUS);  Surgeon: Normie Becton., MD;  Location: [facility] ENDOSCOPY;  Service: Gastroenterology;  Laterality: N/A;   VASECTOMY      Family History  Problem Relation Age of Onset   Melanoma Mother 85   Multiple endocrine neoplasia Mother        MEN1   Heart disease Father    Hyperlipidemia Father    Diabetes  Sister        prothrombin gene mutations, hypercoagulable disorder in sister and her kids.     Multiple endocrine neoplasia Sister        MEN1   Skin cancer Sister    Skin cancer Sister    Hypertension Brother    Cancer Paternal Uncle        unknown type   Stomach cancer Maternal Grandfather    Multiple endocrine neoplasia Maternal Grandfather        MEN1   Liver cancer Cousin        paternal male cousin; d. 19s   Cancer Cousin        paternal male and male cousin; unknown type   Colon cancer Neg Hx    Colon polyps Neg Hx    Esophageal cancer Neg Hx    Rectal cancer Neg Hx    Inflammatory bowel disease Neg Hx    Liver disease Neg Hx    Pancreatic cancer Neg Hx     Social History  Socioeconomic History   Marital status: Married    Spouse name: Not on file   Number of children: Not on file   Years of education: Not on file   Highest education level: Associate degree: occupational, Scientist, product/process development, or vocational program  Occupational History   Not on file  Tobacco Use   Smoking status: Former   Smokeless tobacco: Former    Types: Engineer, drilling   Vaping status: Never Used  Substance and Sexual Activity   Alcohol use: No   Drug use: No   Sexual activity: Not on file  Other Topics Concern   Not on file  Social History Narrative   Not on file   Social Drivers of Health   Financial Resource Strain: Low Risk  (11/12/2023)   Overall Financial Resource Strain (CARDIA)    Difficulty of Paying Living Expenses: Not hard at all  Food Insecurity: No Food Insecurity (11/12/2023)   Hunger Vital Sign    Worried About Running Out of Food in the Last Year: Never true    Ran Out of Food in the Last Year: Never true  Transportation Needs: No Transportation Needs (11/12/2023)   PRAPARE - Administrator, Civil Service (Medical): No    Lack of Transportation (Non-Medical): No  Physical Activity: Unknown (11/12/2023)   Exercise Vital Sign    Days of Exercise per Week: 3 days     Minutes of Exercise per Session: Patient declined  Stress: Stress Concern Present (11/12/2023)   [employer] of Occupational Health - Occupational Stress Questionnaire    Feeling of Stress : To some extent  Social Connections: Socially Integrated (11/12/2023)   Social Connection and Isolation Panel [NHANES]    Frequency of Communication with Friends and Family: More than three times a week    Frequency of Social Gatherings with Friends and Family: Once a week    Attends Religious Services: More than 4 times per year    Active Member of [employer] or Organizations: Yes    Attends Banker Meetings: 1 to 4 times per year    Marital Status: Married  Catering manager Violence: Not on file    Review of Systems  Respiratory: Negative.    Cardiovascular: Negative.   Gastrointestinal: Negative.   Genitourinary: Negative.   Psychiatric/Behavioral: Negative.    All other systems reviewed and are negative.       Objective    BP 136/80   Pulse 72   Ht 5\' 10"  (1.778 m)   Wt 217 lb 4 oz (98.5 kg)   SpO2 98%   BMI 31.17 kg/m  Today's Vitals   11/13/23 1054  BP: 136/80  Pulse: 72  SpO2: 98%  Weight: 217 lb 4 oz (98.5 kg)  Height: 5\' 10"  (1.778 m)   Body mass index is 31.17 kg/m.   Physical Exam Vitals and nursing note reviewed.  Constitutional:      Appearance: Normal appearance. He is normal weight.  HENT:     Head: Normocephalic and atraumatic.  Cardiovascular:     Rate and Rhythm: Normal rate and regular rhythm.     Pulses: Normal pulses.     Heart sounds: Normal heart sounds.  Pulmonary:     Effort: Pulmonary effort is normal.     Breath sounds: Normal breath sounds.  Skin:    General: Skin is warm and dry.     Capillary Refill: Capillary refill takes less than 2 seconds.  Neurological:     General:  No focal deficit present.     Mental Status: He is alert and oriented to person, place, and time. Mental status is at baseline.  Psychiatric:         Mood and Affect: Mood normal.        Behavior: Behavior normal.        Thought Content: Thought content normal.        Judgment: Judgment normal.         Assessment & Plan:   Problem List Items Addressed This Visit     Vitamin D  deficiency   Continue Vitamin D       Relevant Orders   VITAMIN D  25 Hydroxy (Vit-D Deficiency, Fractures)   Hyperlipidemia   Recheck labs today, discussed treatment options. Reluctant to try Repatha , considering CAC. Strongly encouraged recommend consuming a heart healthy diet such as Mediterranean diet or DASH diet with whole grains, fruits, vegetable, fish, lean meats, nuts, and olive oil. Limit sweets and processed foods. I also encourage moderate intensity exercise 150 minutes weekly. This is 3-5 times weekly for 30-50 minutes each session. Goal should be pace of 3 miles/hours, or walking 1.5 miles in 30 minutes. The 10-year ASCVD risk score (Arnett DK, et al., 2019) is: 45%       Relevant Medications   losartan  (COZAAR ) 25 MG tablet   GERD (gastroesophageal reflux disease)   Well controlled on Omeprazole . Continue anti-reflux measures such as raising the head of the bed, avoiding tight clothing or belts, avoiding eating late at night and not lying down shortly after mealtime and achieving weight loss are discussed. Avoid ASA, NSAID's, caffeine, peppermints, alcohol and tobacco. Alert me if there are persistent symptoms, dysphagia, weight loss or GI bleeding.       Relevant Medications   omeprazole  (PRILOSEC) 20 MG capsule   OSA on CPAP   Not using CPAP, sleeping through night, told they did not need CPAP at time of sleep study      Generalized anxiety disorder   Well controlled on Celexa .      Relevant Medications   traZODone  (DESYREL ) 50 MG tablet   Type 2 diabetes mellitus (HCC)   Last A1c 5.7%. Continue Ozempic  1mg  weekly. Labs today. Would like CGM, order placed. A1c and uACR UTD. Foot exam UTD. Vaccines declined. Retinal eye exam  scheduled for July. Recommend heart healthy diet such as Mediterranean diet with whole grains, fruits, vegetable, fish, lean meats, nuts, and olive oil. Limit salt. Encouraged moderate walking, 3-5 times/week for 30-50 minutes each session. Aim for at least 150 minutes.week. Goal should be pace of 3 miles/hours, or walking 1.5 miles in 30 minutes. Seek medical care for urinary frequency, extreme thirst, vision changes, lightheadedness, dizziness.  Follow up in 3 months or sooner if needed      Relevant Medications   losartan  (COZAAR ) 25 MG tablet   Other Relevant Orders   Hemoglobin A1c   Pituitary microadenoma (HCC)   Followed by Neurology, Endo, Opthamology      Essential hypertension   Not at goal today in office. Encouraged to monitor at home over next week and report readings via MyChart, schedule appt if sustains >130/80. Continue losartan  25mg  daily. Recommend heart healthy diet such as Mediterranean diet with whole grains, fruits, vegetable, fish, lean meats, nuts, and olive oil. Limit salt. Encouraged moderate walking, 3-5 times/week for 30-50 minutes each session. Aim for at least 150 minutes.week. Goal should be pace of 3 miles/hours, or walking 1.5 miles in 30 minutes. Avoid  tobacco products. Avoid excess alcohol. Take medications as prescribed and bring medications and blood pressure log with cuff to each office visit. Seek medical care for chest pain, palpitations, shortness of breath with exertion, dizziness/lightheadedness, vision changes, recurrent headaches, or swelling of extremities. Follow up in 1 month      Relevant Medications   losartan  (COZAAR ) 25 MG tablet   Other Relevant Orders   CBC with Differential/Platelet   Comprehensive metabolic panel with GFR   Lipid panel   Primary insomnia   Continue Trazodone  and Gabapentin       Relevant Medications   traZODone  (DESYREL ) 50 MG tablet   Encounter to establish care with new provider - Primary   Today we reviewed your  medical history, current concerns, and health maintenance items. Labs were drawn. HM items UTD, declined vaccines. Will monitor BP. CPE due in December       Return in about 4 weeks (around 12/11/2023) for follow-up.   Jenelle Mis, FNP

## 2023-11-13 NOTE — Assessment & Plan Note (Signed)
 Not using CPAP, sleeping through night, told they did not need CPAP at time of sleep study

## 2023-11-13 NOTE — Assessment & Plan Note (Signed)
Continue Vitamin D

## 2023-11-13 NOTE — Assessment & Plan Note (Addendum)
 Today we reviewed your medical history, current concerns, and health maintenance items. Labs were drawn. HM items UTD, declined vaccines. Will monitor BP. CPE due in December

## 2023-11-13 NOTE — Assessment & Plan Note (Signed)
 Continue Trazodone  and Gabapentin 

## 2023-11-13 NOTE — Assessment & Plan Note (Signed)
 Followed by Neurology, Endo, Opthamology

## 2023-11-13 NOTE — Patient Instructions (Signed)
 It was great to meet you today and I'm excited to have you join the [facility] practice. I hope you had a positive experience today! If you feel so inclined, please feel free to recommend our practice to friends and family. Kurtis Bushman, FNP-C

## 2023-11-13 NOTE — Assessment & Plan Note (Signed)
 Not at goal today in office. Encouraged to monitor at home over next week and report readings via MyChart, schedule appt if sustains >130/80. Continue losartan  25mg  daily. Recommend heart healthy diet such as Mediterranean diet with whole grains, fruits, vegetable, fish, lean meats, nuts, and olive oil. Limit salt. Encouraged moderate walking, 3-5 times/week for 30-50 minutes each session. Aim for at least 150 minutes.week. Goal should be pace of 3 miles/hours, or walking 1.5 miles in 30 minutes. Avoid tobacco products. Avoid excess alcohol. Take medications as prescribed and bring medications and blood pressure log with cuff to each office visit. Seek medical care for chest pain, palpitations, shortness of breath with exertion, dizziness/lightheadedness, vision changes, recurrent headaches, or swelling of extremities. Follow up in 1 month

## 2023-11-14 ENCOUNTER — Other Ambulatory Visit (HOSPITAL_COMMUNITY): Payer: Self-pay

## 2023-11-14 LAB — CBC WITH DIFFERENTIAL/PLATELET
Absolute Lymphocytes: 3101 {cells}/uL (ref 850–3900)
Absolute Monocytes: 410 {cells}/uL (ref 200–950)
Basophils Absolute: 38 {cells}/uL (ref 0–200)
Basophils Relative: 0.5 %
Eosinophils Absolute: 403 {cells}/uL (ref 15–500)
Eosinophils Relative: 5.3 %
HCT: 39.4 % (ref 38.5–50.0)
Hemoglobin: 13.3 g/dL (ref 13.2–17.1)
MCH: 29 pg (ref 27.0–33.0)
MCHC: 33.8 g/dL (ref 32.0–36.0)
MCV: 85.8 fL (ref 80.0–100.0)
MPV: 9.7 fL (ref 7.5–12.5)
Monocytes Relative: 5.4 %
Neutro Abs: 3648 {cells}/uL (ref 1500–7800)
Neutrophils Relative %: 48 %
Platelets: 237 10*3/uL (ref 140–400)
RBC: 4.59 10*6/uL (ref 4.20–5.80)
RDW: 13 % (ref 11.0–15.0)
Total Lymphocyte: 40.8 %
WBC: 7.6 10*3/uL (ref 3.8–10.8)

## 2023-11-14 LAB — COMPREHENSIVE METABOLIC PANEL WITH GFR
AG Ratio: 1.7 (calc) (ref 1.0–2.5)
ALT: 21 U/L (ref 9–46)
AST: 19 U/L (ref 10–35)
Albumin: 4.5 g/dL (ref 3.6–5.1)
Alkaline phosphatase (APISO): 53 U/L (ref 35–144)
BUN: 16 mg/dL (ref 7–25)
CO2: 29 mmol/L (ref 20–32)
Calcium: 9.5 mg/dL (ref 8.6–10.3)
Chloride: 103 mmol/L (ref 98–110)
Creat: 1.06 mg/dL (ref 0.70–1.35)
Globulin: 2.7 g/dL (ref 1.9–3.7)
Glucose, Bld: 95 mg/dL (ref 65–99)
Potassium: 4 mmol/L (ref 3.5–5.3)
Sodium: 137 mmol/L (ref 135–146)
Total Bilirubin: 0.7 mg/dL (ref 0.2–1.2)
Total Protein: 7.2 g/dL (ref 6.1–8.1)
eGFR: 76 mL/min/{1.73_m2} (ref >=60)

## 2023-11-14 LAB — LIPID PANEL
Cholesterol: 232 mg/dL — ABNORMAL HIGH (ref <200)
HDL: 35 mg/dL — ABNORMAL LOW (ref >=40)
LDL Cholesterol (Calc): 160 mg/dL — ABNORMAL HIGH
Non-HDL Cholesterol (Calc): 197 mg/dL — ABNORMAL HIGH (ref <130)
Total CHOL/HDL Ratio: 6.6 (calc) — ABNORMAL HIGH (ref <5.0)
Triglycerides: 209 mg/dL — ABNORMAL HIGH (ref <150)

## 2023-11-14 LAB — HEMOGLOBIN A1C
Hgb A1c MFr Bld: 5.5 % (ref <5.7)
Mean Plasma Glucose: 111 mg/dL
eAG (mmol/L): 6.2 mmol/L

## 2023-11-14 LAB — VITAMIN D 25 HYDROXY (VIT D DEFICIENCY, FRACTURES): Vit D, 25-Hydroxy: 44 ng/mL (ref 30–100)

## 2023-12-02 ENCOUNTER — Other Ambulatory Visit: Payer: Self-pay

## 2023-12-03 ENCOUNTER — Other Ambulatory Visit: Payer: Self-pay

## 2023-12-03 ENCOUNTER — Other Ambulatory Visit (HOSPITAL_COMMUNITY): Payer: Self-pay

## 2023-12-04 ENCOUNTER — Ambulatory Visit: Admitting: Family Medicine

## 2023-12-06 ENCOUNTER — Other Ambulatory Visit (HOSPITAL_COMMUNITY): Payer: Self-pay

## 2023-12-06 ENCOUNTER — Encounter: Payer: Self-pay | Admitting: Family Medicine

## 2023-12-06 ENCOUNTER — Encounter (HOSPITAL_COMMUNITY): Payer: Self-pay

## 2023-12-06 ENCOUNTER — Other Ambulatory Visit: Payer: Self-pay

## 2023-12-06 DIAGNOSIS — F411 Generalized anxiety disorder: Secondary | ICD-10-CM

## 2023-12-06 MED ORDER — CITALOPRAM HYDROBROMIDE 40 MG PO TABS
40.0000 mg | ORAL_TABLET | Freq: Every day | ORAL | 2 refills | Status: DC
  Filled 2023-12-06 – 2024-02-18 (×4): qty 90, 90d supply, fill #0
  Filled 2024-06-01: qty 90, 90d supply, fill #1

## 2023-12-09 ENCOUNTER — Other Ambulatory Visit (HOSPITAL_COMMUNITY): Payer: Self-pay

## 2023-12-10 ENCOUNTER — Other Ambulatory Visit (HOSPITAL_COMMUNITY): Payer: Self-pay

## 2023-12-13 ENCOUNTER — Other Ambulatory Visit (HOSPITAL_COMMUNITY): Payer: Self-pay

## 2024-01-13 ENCOUNTER — Other Ambulatory Visit: Payer: Self-pay | Admitting: Family

## 2024-01-13 ENCOUNTER — Other Ambulatory Visit: Payer: Self-pay | Admitting: Nurse Practitioner

## 2024-01-14 ENCOUNTER — Other Ambulatory Visit: Payer: Self-pay

## 2024-01-14 ENCOUNTER — Other Ambulatory Visit (HOSPITAL_COMMUNITY): Payer: Self-pay

## 2024-01-14 ENCOUNTER — Encounter (HOSPITAL_COMMUNITY): Payer: Self-pay

## 2024-01-14 ENCOUNTER — Encounter: Payer: Self-pay | Admitting: Family Medicine

## 2024-01-14 MED ORDER — GABAPENTIN 300 MG PO CAPS
900.0000 mg | ORAL_CAPSULE | Freq: Every evening | ORAL | 1 refills | Status: DC
  Filled 2024-01-14 – 2024-04-05 (×3): qty 270, 90d supply, fill #0

## 2024-01-14 MED ORDER — OZEMPIC (1 MG/DOSE) 4 MG/3ML ~~LOC~~ SOPN
1.0000 mg | PEN_INJECTOR | SUBCUTANEOUS | 3 refills | Status: DC
  Filled 2024-01-14: qty 3, 28d supply, fill #0
  Filled 2024-02-07: qty 3, 28d supply, fill #1
  Filled 2024-02-10 – 2024-03-09 (×2): qty 3, 28d supply, fill #0
  Filled 2024-04-05: qty 3, 28d supply, fill #1

## 2024-02-05 DIAGNOSIS — H5203 Hypermetropia, bilateral: Secondary | ICD-10-CM | POA: Diagnosis not present

## 2024-02-05 LAB — HM DIABETES EYE EXAM

## 2024-02-10 ENCOUNTER — Other Ambulatory Visit (HOSPITAL_COMMUNITY): Payer: Self-pay

## 2024-02-10 ENCOUNTER — Other Ambulatory Visit (HOSPITAL_BASED_OUTPATIENT_CLINIC_OR_DEPARTMENT_OTHER): Payer: Self-pay

## 2024-02-11 ENCOUNTER — Other Ambulatory Visit (HOSPITAL_COMMUNITY): Payer: Self-pay

## 2024-02-13 ENCOUNTER — Ambulatory Visit: Admitting: Family Medicine

## 2024-02-17 ENCOUNTER — Encounter (HOSPITAL_COMMUNITY): Payer: Self-pay

## 2024-02-17 ENCOUNTER — Other Ambulatory Visit (HOSPITAL_COMMUNITY): Payer: Self-pay

## 2024-02-18 ENCOUNTER — Other Ambulatory Visit (HOSPITAL_COMMUNITY): Payer: Self-pay

## 2024-02-24 ENCOUNTER — Other Ambulatory Visit (HOSPITAL_COMMUNITY): Payer: Self-pay

## 2024-05-04 ENCOUNTER — Other Ambulatory Visit: Payer: Self-pay | Admitting: Family Medicine

## 2024-05-04 ENCOUNTER — Other Ambulatory Visit: Payer: Self-pay

## 2024-05-04 MED ORDER — OZEMPIC (1 MG/DOSE) 4 MG/3ML ~~LOC~~ SOPN
1.0000 mg | PEN_INJECTOR | SUBCUTANEOUS | 3 refills | Status: DC
  Filled 2024-05-04 – 2024-05-12 (×2): qty 9, 84d supply, fill #0
  Filled 2024-07-30: qty 3, 28d supply, fill #1

## 2024-05-05 ENCOUNTER — Other Ambulatory Visit (HOSPITAL_COMMUNITY): Payer: Self-pay

## 2024-05-12 ENCOUNTER — Other Ambulatory Visit (HOSPITAL_COMMUNITY): Payer: Self-pay

## 2024-05-15 ENCOUNTER — Other Ambulatory Visit (HOSPITAL_COMMUNITY): Payer: Self-pay

## 2024-06-04 ENCOUNTER — Other Ambulatory Visit: Payer: Self-pay | Admitting: Family Medicine

## 2024-06-04 DIAGNOSIS — I10 Essential (primary) hypertension: Secondary | ICD-10-CM

## 2024-06-08 ENCOUNTER — Other Ambulatory Visit (HOSPITAL_COMMUNITY): Payer: Self-pay

## 2024-06-08 ENCOUNTER — Other Ambulatory Visit: Payer: Self-pay | Admitting: Family Medicine

## 2024-06-08 DIAGNOSIS — I10 Essential (primary) hypertension: Secondary | ICD-10-CM

## 2024-06-08 MED ORDER — LOSARTAN POTASSIUM 25 MG PO TABS
25.0000 mg | ORAL_TABLET | Freq: Every day | ORAL | 1 refills | Status: AC
  Filled 2024-06-08: qty 90, 90d supply, fill #0

## 2024-06-26 ENCOUNTER — Encounter: Payer: Commercial Managed Care - PPO | Admitting: Nurse Practitioner

## 2024-07-06 ENCOUNTER — Other Ambulatory Visit: Payer: Self-pay | Admitting: Family Medicine

## 2024-07-08 ENCOUNTER — Other Ambulatory Visit (HOSPITAL_COMMUNITY): Payer: Self-pay

## 2024-07-08 ENCOUNTER — Other Ambulatory Visit: Payer: Self-pay | Admitting: Family Medicine

## 2024-07-12 MED ORDER — GABAPENTIN 300 MG PO CAPS
900.0000 mg | ORAL_CAPSULE | Freq: Every evening | ORAL | 1 refills | Status: AC
  Filled 2024-07-12: qty 270, 90d supply, fill #0

## 2024-07-13 ENCOUNTER — Other Ambulatory Visit (HOSPITAL_COMMUNITY): Payer: Self-pay

## 2024-07-30 ENCOUNTER — Other Ambulatory Visit: Payer: Self-pay | Admitting: Family Medicine

## 2024-07-30 ENCOUNTER — Other Ambulatory Visit: Payer: Self-pay

## 2024-07-30 ENCOUNTER — Other Ambulatory Visit (HOSPITAL_COMMUNITY): Payer: Self-pay

## 2024-07-30 MED ORDER — OZEMPIC (1 MG/DOSE) 4 MG/3ML ~~LOC~~ SOPN
1.0000 mg | PEN_INJECTOR | SUBCUTANEOUS | 3 refills | Status: AC
  Filled 2024-07-30: qty 3, 28d supply, fill #0
  Filled 2024-08-05 (×2): qty 9, 84d supply, fill #0

## 2024-08-03 ENCOUNTER — Other Ambulatory Visit: Payer: Self-pay | Admitting: Family Medicine

## 2024-08-04 ENCOUNTER — Encounter (HOSPITAL_COMMUNITY): Payer: Self-pay

## 2024-08-04 ENCOUNTER — Other Ambulatory Visit (HOSPITAL_COMMUNITY): Payer: Self-pay

## 2024-08-04 MED ORDER — OMEPRAZOLE 20 MG PO CPDR
20.0000 mg | DELAYED_RELEASE_CAPSULE | Freq: Every day | ORAL | 2 refills | Status: AC
  Filled 2024-08-04: qty 90, 90d supply, fill #0

## 2024-08-05 ENCOUNTER — Other Ambulatory Visit (HOSPITAL_COMMUNITY): Payer: Self-pay

## 2024-08-06 ENCOUNTER — Other Ambulatory Visit (HOSPITAL_BASED_OUTPATIENT_CLINIC_OR_DEPARTMENT_OTHER): Payer: Self-pay

## 2024-08-06 ENCOUNTER — Other Ambulatory Visit: Payer: Self-pay | Admitting: Family Medicine

## 2024-08-06 DIAGNOSIS — F5101 Primary insomnia: Secondary | ICD-10-CM

## 2024-08-06 DIAGNOSIS — F411 Generalized anxiety disorder: Secondary | ICD-10-CM

## 2024-08-10 ENCOUNTER — Other Ambulatory Visit (HOSPITAL_BASED_OUTPATIENT_CLINIC_OR_DEPARTMENT_OTHER): Payer: Self-pay

## 2024-08-10 MED ORDER — TRAZODONE HCL 50 MG PO TABS
25.0000 mg | ORAL_TABLET | ORAL | 2 refills | Status: AC
  Filled 2024-08-10 – 2024-08-11 (×2): qty 90, 90d supply, fill #0

## 2024-08-10 MED ORDER — CITALOPRAM HYDROBROMIDE 40 MG PO TABS
40.0000 mg | ORAL_TABLET | Freq: Every day | ORAL | 2 refills | Status: AC
  Filled 2024-08-10 – 2024-08-12 (×4): qty 90, 90d supply, fill #0

## 2024-08-11 ENCOUNTER — Other Ambulatory Visit (HOSPITAL_BASED_OUTPATIENT_CLINIC_OR_DEPARTMENT_OTHER): Payer: Self-pay

## 2024-08-11 ENCOUNTER — Other Ambulatory Visit (HOSPITAL_COMMUNITY): Payer: Self-pay

## 2024-08-12 ENCOUNTER — Other Ambulatory Visit (HOSPITAL_COMMUNITY): Payer: Self-pay

## 2024-08-12 ENCOUNTER — Other Ambulatory Visit: Payer: Self-pay
